# Patient Record
Sex: Female | Born: 1940 | Race: Black or African American | Hispanic: No | Smoking: Never smoker
Health system: Southern US, Community
[De-identification: ages and names within clinical notes are randomized; demographics above are authoritative.]

## PROBLEM LIST (undated history)

## (undated) DIAGNOSIS — R0600 Dyspnea, unspecified: Secondary | ICD-10-CM

## (undated) DIAGNOSIS — R600 Localized edema: Secondary | ICD-10-CM

## (undated) DIAGNOSIS — I340 Nonrheumatic mitral (valve) insufficiency: Secondary | ICD-10-CM

## (undated) DIAGNOSIS — I890 Lymphedema, not elsewhere classified: Secondary | ICD-10-CM

## (undated) DIAGNOSIS — I4891 Unspecified atrial fibrillation: Secondary | ICD-10-CM

## (undated) DIAGNOSIS — I10 Essential (primary) hypertension: Secondary | ICD-10-CM

## (undated) DIAGNOSIS — I493 Ventricular premature depolarization: Secondary | ICD-10-CM

## (undated) DIAGNOSIS — E119 Type 2 diabetes mellitus without complications: Secondary | ICD-10-CM

## (undated) DIAGNOSIS — I429 Cardiomyopathy, unspecified: Secondary | ICD-10-CM

## (undated) DIAGNOSIS — D472 Monoclonal gammopathy: Secondary | ICD-10-CM

## (undated) HISTORY — DX: Ventricular premature depolarization: I49.3

## (undated) HISTORY — DX: Cardiomyopathy, unspecified: I42.9

## (undated) HISTORY — DX: Nonrheumatic mitral (valve) insufficiency: I34.0

## (undated) HISTORY — DX: Dyspnea, unspecified: R06.00

## (undated) HISTORY — DX: Lymphedema, not elsewhere classified: I89.0

## (undated) HISTORY — DX: Type 2 diabetes mellitus without complications: E11.9

## (undated) HISTORY — DX: Monoclonal gammopathy: D47.2

## (undated) HISTORY — DX: Localized edema: R60.0

---

## 2014-07-16 ENCOUNTER — Telehealth: Payer: Self-pay | Admitting: Hematology and Oncology

## 2014-07-16 NOTE — Telephone Encounter (Signed)
NEW PATIENT APPT-S/W PATIENT AND GAVE NP APPT FOR 05/27 @ 12:30 W/DR. GUDENA DX-ELEVATED PROTEIN W/ABNL SPEP

## 2014-07-31 ENCOUNTER — Telehealth: Payer: Self-pay | Admitting: Hematology and Oncology

## 2014-07-31 ENCOUNTER — Other Ambulatory Visit: Payer: Self-pay | Admitting: *Deleted

## 2014-07-31 ENCOUNTER — Encounter: Payer: Self-pay | Admitting: *Deleted

## 2014-07-31 ENCOUNTER — Ambulatory Visit: Payer: Medicare Other

## 2014-07-31 ENCOUNTER — Ambulatory Visit (HOSPITAL_BASED_OUTPATIENT_CLINIC_OR_DEPARTMENT_OTHER): Payer: Medicare Other | Admitting: Hematology and Oncology

## 2014-07-31 ENCOUNTER — Encounter: Payer: Self-pay | Admitting: Hematology and Oncology

## 2014-07-31 VITALS — BP 185/87 | HR 84 | Temp 98.5°F | Resp 18 | Ht 64.0 in | Wt 205.0 lb

## 2014-07-31 DIAGNOSIS — R799 Abnormal finding of blood chemistry, unspecified: Secondary | ICD-10-CM

## 2014-07-31 DIAGNOSIS — D472 Monoclonal gammopathy: Secondary | ICD-10-CM

## 2014-07-31 NOTE — Assessment & Plan Note (Addendum)
Elevated M protein: This is based on results done on 06/17/2014 at [facility] which showed IgG level 3018, M spike 1.7 g, 29.69, lambda 17.55, A lambda ratio 1.69, IgG Monoclonal protein, hemoglobin 14.7, creatinine 0.82, normal white count and differential, calcium 9.5, albumen 4.3, urine protein leukapheresis no M spike, LDH 199, beta 2 microglobulin 2.1, bone survey also normal  Counseling: I discussed with the patient in the spectrum of disorders called monoclonal gammopathies that range from MGUS to multiple myeloma. I discussed the pathogenesis and end organ dysfunction that monoclonal proteins can lead to a multiple myeloma. Patient does not have anemia or hypercalcemia or renal dysfunction. I suspect that she may have MGUS.  Plan: 1. Return to clinic in 6 weeks with labs done prior to the visit 2. Plan is to follow her every 3 months with serum protein electrophoresis, CBC differential, CMP to watch for any changes in her renal function, calcium levels and anemia.  If there are any changes, then we will pursue a bone marrow biopsy.

## 2014-07-31 NOTE — Telephone Encounter (Signed)
Gave avs & calendar for July °

## 2014-07-31 NOTE — Progress Notes (Signed)
I checked in new patient with no issues prior to seeing the dr °

## 2014-07-31 NOTE — Progress Notes (Signed)
Received office notes from [facility], sent to scan.

## 2014-07-31 NOTE — Progress Notes (Signed)
[place] CONSULT NOTE  Patient Care Team: No Pcp Per Patient as PCP - General (General Practice)  CHIEF COMPLAINTS/PURPOSE OF CONSULTATION:  Newly diagnosed MGUS  HISTORY OF PRESENTING ILLNESS:  [name] Long 74 y.o. female is here because of recent diagnosis of MGUS. Patient lives at [place] through the winter and had her primary care physician check labs which showed elevated serum protein. This led to additional workup with serum protein electrophoresis that showed monoclonal protein of 1.9 g which was IgG kappa in March 2016. Comprehensive evaluation did not show any evidence of anemia or hypercalcemia or renal dysfunction. Bone survey was performed which was normal. She had a repeat serum electrophoresis in April 2016 which showed an M protein of 1.7 g. Patient lives in [place] through the summer and is here today accompanied by her daughter. She reports no bone pain or any other symptoms of myeloma.   I reviewed her records extensively and collaborated the history with the patient.  MEDICAL HISTORY: Hypertension, diabetes, cataracts SURGICAL HISTORY: Cesarean section  SOCIAL HISTORY: Single denies tobacco alcohol or recreational drug use retired lives half time and [place] and half-time [place]  FAMILY HISTORY: no history of any cancers  ALLERGIES:  has no allergies on file. REVIEW OF SYSTEMS:   Constitutional: Denies fevers, chills or abnormal night sweats Eyes: Denies blurriness of vision, double vision or watery eyes Ears, nose, mouth, throat, and face: Denies mucositis or sore throat Respiratory: Denies cough, dyspnea or wheezes Cardiovascular: Denies palpitation, chest discomfort or lower extremity swelling Gastrointestinal:  Denies nausea, heartburn or change in bowel habits Skin: Denies abnormal skin rashes Lymphatics: Denies new lymphadenopathy or easy bruising Neurological:Denies numbness, tingling or new  weaknesses Behavioral/Psych: Mood is stable, no new changes   All other systems were reviewed with the patient and are negative.  PHYSICAL EXAMINATION: ECOG PERFORMANCE STATUS: 1 - Symptomatic but completely ambulatory  Filed Vitals:   07/31/14 1243  BP: 185/87  Pulse: 84  Temp: 98.5 F (36.9 C)  Resp: 18   Filed Weights   07/31/14 1243  Weight: 205 lb (92.987 kg)    GENERAL:alert, no distress and comfortable SKIN: skin color, texture, turgor are normal, no rashes or significant lesions EYES: normal, conjunctiva are pink and non-injected, sclera clear OROPHARYNX:no exudate, no erythema and lips, buccal mucosa, and tongue normal  NECK: supple, thyroid normal size, non-tender, without nodularity LYMPH:  no palpable lymphadenopathy in the cervical, axillary or inguinal LUNGS: clear to auscultation and percussion with normal breathing effort HEART: regular rate & rhythm and no murmurs and no lower extremity edema ABDOMEN:abdomen soft, non-tender and normal bowel sounds Musculoskeletal:no cyanosis of digits and no clubbing  PSYCH: alert & oriented x 3 with fluent speech NEURO: no focal motor/sensory deficits RADIOGRAPHIC STUDIES: Bone survey done at [place] is normal  ASSESSMENT AND PLAN:  MGUS (monoclonal gammopathy of unknown significance) Elevated M protein: This is based on results done on 06/17/2014 at [facility] which showed IgG level 3018, M spike 1.7 g, 29.69, lambda 17.55, A lambda ratio 1.69, IgG Monoclonal protein, hemoglobin 14.7, creatinine 0.82, normal white count and differential, calcium 9.5, albumen 4.3, urine protein leukapheresis no M spike, LDH 199, beta 2 microglobulin 2.1, bone survey also normal  Counseling: I discussed with the patient in the spectrum of disorders called monoclonal gammopathies that range from MGUS to multiple myeloma. I discussed the pathogenesis and end organ dysfunction that monoclonal proteins can lead to a  multiple myeloma. Patient does not have anemia or hypercalcemia or renal dysfunction. I suspect that she may have MGUS.  Plan: 1. Return to clinic in 6 weeks with labs done prior to the visit 2. Plan is to follow her every 3 months with serum protein electrophoresis, CBC differential, CMP to watch for any changes in her renal function, calcium levels and anemia.  If there are any changes, then we will pursue a bone marrow biopsy.    All questions were answered. The patient knows to call the clinic with any problems, questions or concerns.    Rulon Eisenmenger, MD 1:54 PM

## 2014-09-11 ENCOUNTER — Other Ambulatory Visit (HOSPITAL_BASED_OUTPATIENT_CLINIC_OR_DEPARTMENT_OTHER): Payer: Medicare Other

## 2014-09-11 DIAGNOSIS — D472 Monoclonal gammopathy: Secondary | ICD-10-CM

## 2014-09-11 LAB — COMPREHENSIVE METABOLIC PANEL (CC13)
ALT: 15 U/L (ref 0–55)
ANION GAP: 9 meq/L (ref 3–11)
AST: 18 U/L (ref 5–34)
Albumin: 3.7 g/dL (ref 3.5–5.0)
Alkaline Phosphatase: 82 U/L (ref 40–150)
BUN: 14.9 mg/dL (ref 7.0–26.0)
CO2: 27 meq/L (ref 22–29)
Calcium: 9.7 mg/dL (ref 8.4–10.4)
Chloride: 105 mEq/L (ref 98–109)
Creatinine: 0.9 mg/dL (ref 0.6–1.1)
EGFR: 62 mL/min/{1.73_m2} — AB (ref >90)
GLUCOSE: 102 mg/dL (ref 70–140)
Potassium: 3.9 mEq/L (ref 3.5–5.1)
SODIUM: 140 meq/L (ref 136–145)
TOTAL PROTEIN: 8.8 g/dL — AB (ref 6.4–8.3)
Total Bilirubin: 0.99 mg/dL (ref 0.20–1.20)

## 2014-09-11 LAB — CBC WITH DIFFERENTIAL/PLATELET
BASO%: 0.3 % (ref 0.0–2.0)
Basophils Absolute: 0 10*3/uL (ref 0.0–0.1)
EOS ABS: 0.1 10*3/uL (ref 0.0–0.5)
EOS%: 1.1 % (ref 0.0–7.0)
HCT: 43.5 % (ref 34.8–46.6)
HGB: 14.9 g/dL (ref 11.6–15.9)
LYMPH%: 36.3 % (ref 14.0–49.7)
MCH: 31.3 pg (ref 25.1–34.0)
MCHC: 34.3 g/dL (ref 31.5–36.0)
MCV: 91.4 fL (ref 79.5–101.0)
MONO#: 0.4 10*3/uL (ref 0.1–0.9)
MONO%: 6.4 % (ref 0.0–14.0)
NEUT%: 55.9 % (ref 38.4–76.8)
NEUTROS ABS: 3.4 10*3/uL (ref 1.5–6.5)
PLATELETS: 168 10*3/uL (ref 145–400)
RBC: 4.76 10*6/uL (ref 3.70–5.45)
RDW: 13.7 % (ref 11.2–14.5)
WBC: 6.1 10*3/uL (ref 3.9–10.3)
lymph#: 2.2 10*3/uL (ref 0.9–3.3)

## 2014-09-15 LAB — KAPPA/LAMBDA LIGHT CHAINS
KAPPA LAMBDA RATIO: 2.65 — AB (ref 0.26–1.65)
Kappa free light chain: 3.55 mg/dL — ABNORMAL HIGH (ref 0.33–1.94)
LAMBDA FREE LGHT CHN: 1.34 mg/dL (ref 0.57–2.63)

## 2014-09-15 LAB — SPEP & IFE WITH QIG
ABNORMAL PROTEIN BAND1: 1.9 g/dL
ALBUMIN ELP: 3.9 g/dL (ref 3.8–4.8)
ALPHA-1-GLOBULIN: 0.3 g/dL (ref 0.2–0.3)
Alpha-2-Globulin: 0.6 g/dL (ref 0.5–0.9)
BETA 2: 2.5 g/dL — AB (ref 0.2–0.5)
Beta Globulin: 0.5 g/dL (ref 0.4–0.6)
GAMMA GLOBULIN: 0.6 g/dL — AB (ref 0.8–1.7)
IgA: 134 mg/dL (ref 69–380)
IgG (Immunoglobin G), Serum: 2750 mg/dL — ABNORMAL HIGH (ref 690–1700)
IgM, Serum: 110 mg/dL (ref 52–322)
Total Protein, Serum Electrophoresis: 8.3 g/dL — ABNORMAL HIGH (ref 6.1–8.1)

## 2014-09-15 LAB — BETA 2 MICROGLOBULIN, SERUM: Beta-2 Microglobulin: 2.33 mg/L (ref <=2.51)

## 2014-09-17 NOTE — Assessment & Plan Note (Signed)
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 I discussed the difference between MGUS and Myeloma.  Plan: 1. F/U Q 6 months with labs 2. If patient develops anemia, or hypercalcemia or renal insufficiency, we need to see sooner

## 2014-09-18 ENCOUNTER — Ambulatory Visit (HOSPITAL_BASED_OUTPATIENT_CLINIC_OR_DEPARTMENT_OTHER): Payer: Medicare Other | Admitting: Hematology and Oncology

## 2014-09-18 ENCOUNTER — Telehealth: Payer: Self-pay | Admitting: Hematology and Oncology

## 2014-09-18 ENCOUNTER — Encounter: Payer: Self-pay | Admitting: Hematology and Oncology

## 2014-09-18 VITALS — BP 190/87 | HR 87 | Temp 98.4°F | Resp 18 | Ht 64.0 in | Wt 208.8 lb

## 2014-09-18 DIAGNOSIS — D472 Monoclonal gammopathy: Secondary | ICD-10-CM

## 2014-09-18 NOTE — Progress Notes (Signed)
Patient Care Team: No Pcp Per Patient as PCP - General (General Practice)  DIAGNOSIS: MGUS Current treatment: Observation  CHIEF COMPLIANT: Follow-up on blood work  INTERVAL HISTORY: [name] Long is a 74 year old with above-mentioned history of elevated monoclonal protein was here to discuss the results of blood work that she had done on 09/11/2014. She reports no new problems or concerns. Denies any bone pain.  REVIEW OF SYSTEMS:   Constitutional: Denies fevers, chills or abnormal weight loss Eyes: Denies blurriness of vision Ears, nose, mouth, throat, and face: Denies mucositis or sore throat Respiratory: Denies cough, dyspnea or wheezes Cardiovascular: Denies palpitation, chest discomfort or lower extremity swelling Gastrointestinal:  Denies nausea, heartburn or change in bowel habits Skin: Denies abnormal skin rashes Lymphatics: Denies new lymphadenopathy or easy bruising Neurological:Denies numbness, tingling or new weaknesses Behavioral/Psych: Mood is stable, no new changes  All other systems were reviewed with the patient and are negative.  I have reviewed the past medical history, past surgical history, social history and family history with the patient and they are unchanged from previous note.  ALLERGIES:  has no allergies on file.  MEDICATIONS:  No current outpatient prescriptions on file.   No current facility-administered medications for this visit.    PHYSICAL EXAMINATION: ECOG PERFORMANCE STATUS: 0 - Asymptomatic  Filed Vitals:   09/18/14 0806  BP: 190/87  Pulse: 87  Temp: 98.4 F (36.9 C)  Resp: 18   Filed Weights   09/18/14 0806  Weight: 208 lb 12.8 oz (94.711 kg)    GENERAL:alert, no distress and comfortable SKIN: skin color, texture, turgor are normal, no rashes or significant lesions EYES: normal, Conjunctiva are pink and non-injected, sclera clear OROPHARYNX:no exudate, no erythema and lips, buccal mucosa, and tongue normal  NECK: supple,  thyroid normal size, non-tender, without nodularity LYMPH:  no palpable lymphadenopathy in the cervical, axillary or inguinal LUNGS: clear to auscultation and percussion with normal breathing effort HEART: regular rate & rhythm and no murmurs and no lower extremity edema ABDOMEN:abdomen soft, non-tender and normal bowel sounds Musculoskeletal:no cyanosis of digits and no clubbing  NEURO: alert & oriented x 3 with fluent speech, no focal motor/sensory deficits  LABORATORY DATA:  I have reviewed the data as listed   Chemistry      Component Value Date/Time   NA 140 09/11/2014 0813   K 3.9 09/11/2014 0813   CO2 27 09/11/2014 0813   BUN 14.9 09/11/2014 0813   CREATININE 0.9 09/11/2014 0813      Component Value Date/Time   CALCIUM 9.7 09/11/2014 0813   ALKPHOS 82 09/11/2014 0813   AST 18 09/11/2014 0813   ALT 15 09/11/2014 0813   BILITOT 0.99 09/11/2014 0813       Lab Results  Component Value Date   WBC 6.1 09/11/2014   HGB 14.9 09/11/2014   HCT 43.5 09/11/2014   MCV 91.4 09/11/2014   PLT 168 09/11/2014   NEUTROABS 3.4 09/11/2014   ASSESSMENT & PLAN:  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 I discussed the difference between MGUS and Myeloma. Based on these labs there is no evidence of end organ damage and hence does not fit the criteria for multiple myeloma.   Plan: 1. F/U 3 months with labs,  if the labs look stable then we will see her every 6 months thereafter. Patient plans to leave to [facility] after her follow-up visit. 2. If patient develops anemia, or hypercalcemia or renal insufficiency, we need to see sooner  Orders Placed This Encounter  Procedures  . CBC with Differential     Standing Status: Future     Number of Occurrences:      Standing Expiration Date: 09/18/2015  . Comprehensive metabolic panel (Cmet) - [facility]    Standing Status: Future     Number of Occurrences:      Standing Expiration Date: 09/18/2015  . Beta 2 microglobulin, serum    Standing Status: Future     Number of Occurrences:      Standing Expiration Date: 10/23/2015  . SPEP & IFE with QIG    Standing Status: Future     Number of Occurrences:      Standing Expiration Date: 10/23/2015  . Kappa/lambda light chains    Standing Status: Future     Number of Occurrences:      Standing Expiration Date: 10/23/2015   The patient has a good understanding of the overall plan. she agrees with it. she will call with any problems that may develop before the next visit here.   Rulon Eisenmenger, MD

## 2014-09-18 NOTE — Telephone Encounter (Signed)
Gave avs & calendar for October. °

## 2014-12-18 ENCOUNTER — Other Ambulatory Visit (HOSPITAL_BASED_OUTPATIENT_CLINIC_OR_DEPARTMENT_OTHER): Payer: Medicare Other

## 2014-12-18 DIAGNOSIS — D472 Monoclonal gammopathy: Secondary | ICD-10-CM | POA: Diagnosis present

## 2014-12-18 LAB — CBC WITH DIFFERENTIAL/PLATELET
BASO%: 0.8 % (ref 0.0–2.0)
BASOS ABS: 0 10*3/uL (ref 0.0–0.1)
EOS%: 1.2 % (ref 0.0–7.0)
Eosinophils Absolute: 0.1 10*3/uL (ref 0.0–0.5)
HCT: 40.8 % (ref 34.8–46.6)
HEMOGLOBIN: 13.8 g/dL (ref 11.6–15.9)
LYMPH%: 37.9 % (ref 14.0–49.7)
MCH: 30.7 pg (ref 25.1–34.0)
MCHC: 33.9 g/dL (ref 31.5–36.0)
MCV: 90.6 fL (ref 79.5–101.0)
MONO#: 0.4 10*3/uL (ref 0.1–0.9)
MONO%: 6.2 % (ref 0.0–14.0)
NEUT#: 3.1 10*3/uL (ref 1.5–6.5)
NEUT%: 53.9 % (ref 38.4–76.8)
PLATELETS: 158 10*3/uL (ref 145–400)
RBC: 4.5 10*6/uL (ref 3.70–5.45)
RDW: 13.8 % (ref 11.2–14.5)
WBC: 5.8 10*3/uL (ref 3.9–10.3)
lymph#: 2.2 10*3/uL (ref 0.9–3.3)

## 2014-12-18 LAB — COMPREHENSIVE METABOLIC PANEL (CC13)
ALT: 11 U/L (ref 0–55)
ANION GAP: 8 meq/L (ref 3–11)
AST: 15 U/L (ref 5–34)
Albumin: 3.5 g/dL (ref 3.5–5.0)
Alkaline Phosphatase: 79 U/L (ref 40–150)
BILIRUBIN TOTAL: 0.89 mg/dL (ref 0.20–1.20)
BUN: 10.9 mg/dL (ref 7.0–26.0)
CALCIUM: 8.9 mg/dL (ref 8.4–10.4)
CO2: 27 mEq/L (ref 22–29)
CREATININE: 0.9 mg/dL (ref 0.6–1.1)
Chloride: 103 mEq/L (ref 98–109)
EGFR: 67 mL/min/{1.73_m2} — ABNORMAL LOW (ref >90)
Glucose: 115 mg/dl (ref 70–140)
Potassium: 3.9 mEq/L (ref 3.5–5.1)
Sodium: 139 mEq/L (ref 136–145)
Total Protein: 8.3 g/dL (ref 6.4–8.3)

## 2014-12-22 LAB — SPEP & IFE WITH QIG
ALPHA-2-GLOBULIN: 0.5 g/dL (ref 0.5–0.9)
Abnormal Protein Band1: 2 g/dL
Albumin ELP: 3.8 g/dL (ref 3.8–4.8)
Alpha-1-Globulin: 0.3 g/dL (ref 0.2–0.3)
BETA GLOBULIN: 0.4 g/dL (ref 0.4–0.6)
Beta 2: 2.5 g/dL — ABNORMAL HIGH (ref 0.2–0.5)
Gamma Globulin: 0.6 g/dL — ABNORMAL LOW (ref 0.8–1.7)
IgA: 156 mg/dL (ref 69–380)
IgG (Immunoglobin G), Serum: 2990 mg/dL — ABNORMAL HIGH (ref 690–1700)
IgM, Serum: 104 mg/dL (ref 52–322)
Total Protein, Serum Electrophoresis: 8.2 g/dL — ABNORMAL HIGH (ref 6.1–8.1)

## 2014-12-22 LAB — KAPPA/LAMBDA LIGHT CHAINS
KAPPA FREE LGHT CHN: 2.55 mg/dL — AB (ref 0.33–1.94)
Kappa:Lambda Ratio: 2.71 — ABNORMAL HIGH (ref 0.26–1.65)
Lambda Free Lght Chn: 0.94 mg/dL (ref 0.57–2.63)

## 2014-12-22 LAB — BETA 2 MICROGLOBULIN, SERUM: Beta-2 Microglobulin: 2.11 mg/L (ref <=2.51)

## 2014-12-24 NOTE — Assessment & Plan Note (Signed)
IgG Kappa MGUS: M-Protein 2 gm (was 1.9 gms); Kappa 2.55 (was 3.55) ; K:L ratio: 2.71 (was 2.65)  Normal Hb, Creatinine and calcium Bone Survey 06/11/14: No lytic lesions Beta 2 Microglobulin: 2.33 IgG: 2750 mg/dl Albumin: 3.7  I discussed the results and provided patient a copy of the results. I discussed the difference between MGUS and Myeloma. Based on these labs there is no evidence of end organ damage and hence does not fit the criteria for multiple myeloma.   Plan: 1. F/U 6 months with labs Patient plans to leave to [facility] 2. If patient develops anemia, or hypercalcemia or renal insufficiency, we need to see sooner

## 2014-12-25 ENCOUNTER — Ambulatory Visit (HOSPITAL_BASED_OUTPATIENT_CLINIC_OR_DEPARTMENT_OTHER): Payer: Medicare Other | Admitting: Hematology and Oncology

## 2014-12-25 ENCOUNTER — Encounter: Payer: Self-pay | Admitting: Hematology and Oncology

## 2014-12-25 ENCOUNTER — Telehealth: Payer: Self-pay | Admitting: Hematology and Oncology

## 2014-12-25 VITALS — BP 186/88 | HR 85 | Temp 98.1°F | Resp 18 | Ht 64.0 in | Wt 219.7 lb

## 2014-12-25 DIAGNOSIS — I10 Essential (primary) hypertension: Secondary | ICD-10-CM

## 2014-12-25 DIAGNOSIS — D472 Monoclonal gammopathy: Secondary | ICD-10-CM

## 2014-12-25 DIAGNOSIS — R2240 Localized swelling, mass and lump, unspecified lower limb: Secondary | ICD-10-CM | POA: Diagnosis not present

## 2014-12-25 NOTE — Telephone Encounter (Signed)
Patient aware of 63month appointments

## 2014-12-25 NOTE — Progress Notes (Signed)
Patient Care Team: No Pcp Per Patient as PCP - General (General Practice)  DIAGNOSIS: MGUS  CHIEF COMPLIANT: Leg swelling  INTERVAL HISTORY: [name] Long is a 74 year old with above-mentioned history of MGUS who is currently on surveillance. She is here for three-month follow-up to recheck her blood work. She reports no major problems or concerns. She is very excited about going back to [place] where she lives for the rest of the winter. She has chronic leg swelling which is unchanged from before. Her blood pressure is elevated today but she reports that it is white coat hypertension.  REVIEW OF SYSTEMS:   Constitutional: Denies fevers, chills or abnormal weight loss Eyes: Denies blurriness of vision Ears, nose, mouth, throat, and face: Denies mucositis or sore throat Respiratory: Denies cough, dyspnea or wheezes Cardiovascular: Denies palpitation, chest discomfort, complains of leg swelling Gastrointestinal:  Denies nausea, heartburn or change in bowel habits Skin: Denies abnormal skin rashes Lymphatics: Denies new lymphadenopathy or easy bruising Neurological:Denies numbness, tingling or new weaknesses Behavioral/Psych: Mood is stable, no new changes   All other systems were reviewed with the patient and are negative.  I have reviewed the past medical history, past surgical history, social history and family history with the patient and they are unchanged from previous note.  ALLERGIES:  has no allergies on file.  MEDICATIONS:  No current outpatient prescriptions on file.   No current facility-administered medications for this visit.    PHYSICAL EXAMINATION: ECOG PERFORMANCE STATUS: 1 - Symptomatic but completely ambulatory  Filed Vitals:   12/25/14 0810  BP: 186/88  Pulse: 85  Temp: 98.1 F (36.7 C)  Resp: 18   Filed Weights   12/25/14 0810  Weight: 219 lb 11.2 oz (99.655 kg)    GENERAL:alert, no distress and comfortable SKIN: skin color, texture, turgor are  normal, no rashes or significant lesions EYES: normal, Conjunctiva are pink and non-injected, sclera clear OROPHARYNX:no exudate, no erythema and lips, buccal mucosa, and tongue normal  NECK: supple, thyroid normal size, non-tender, without nodularity LYMPH:  no palpable lymphadenopathy in the cervical, axillary or inguinal LUNGS: clear to auscultation and percussion with normal breathing effort HEART: regular rate & rhythm and no murmurs and 2+ lower extremity edema ABDOMEN:abdomen soft, non-tender and normal bowel sounds Musculoskeletal:no cyanosis of digits and no clubbing  NEURO: alert & oriented x 3 with fluent speech, no focal motor/sensory deficits  LABORATORY DATA:  I have reviewed the data as listed   Chemistry      Component Value Date/Time   NA 139 12/18/2014 0758   K 3.9 12/18/2014 0758   CO2 27 12/18/2014 0758   BUN 10.9 12/18/2014 0758   CREATININE 0.9 12/18/2014 0758      Component Value Date/Time   CALCIUM 8.9 12/18/2014 0758   ALKPHOS 79 12/18/2014 0758   AST 15 12/18/2014 0758   ALT 11 12/18/2014 0758   BILITOT 0.89 12/18/2014 0758       Lab Results  Component Value Date   WBC 5.8 12/18/2014   HGB 13.8 12/18/2014   HCT 40.8 12/18/2014   MCV 90.6 12/18/2014   PLT 158 12/18/2014   NEUTROABS 3.1 12/18/2014   ASSESSMENT & PLAN:  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 I discussed the difference between MGUS and Myeloma. Based on these labs there is no evidence of end organ damage and hence does not fit the criteria for multiple myeloma.   Plan: 1. F/U 7 months with labs Patient plans to leave to [facility] 2. If patient develops anemia, or  hypercalcemia or renal insufficiency, we need to see sooner 3. Leg swelling: I encouraged her to see her primary care physician. I discussed with her about decreasing salt intake. 4. Hypertension: Patient reports that this is white coat hypertension.   Orders Placed This Encounter  Procedures  . CBC with Differential    Standing Status: Future     Number of Occurrences:      Standing Expiration Date: 12/25/2015  . Comprehensive metabolic panel (Cmet) - [facility]    Standing Status: Future     Number of Occurrences:      Standing Expiration Date: 12/25/2015  . Beta 2 microglobulin, serum    Standing Status: Future     Number of Occurrences:      Standing Expiration Date: 01/29/2016  . SPEP & IFE with QIG    Standing Status: Future     Number of Occurrences:      Standing Expiration Date: 01/29/2016  . Kappa/lambda light chains    Standing Status: Future     Number of Occurrences:      Standing Expiration Date: 01/29/2016   The patient has a good understanding of the overall plan. she agrees with it. she will call with any problems that may develop before the next visit here.   Rulon Eisenmenger, MD 12/25/2014

## 2015-07-16 ENCOUNTER — Other Ambulatory Visit (HOSPITAL_BASED_OUTPATIENT_CLINIC_OR_DEPARTMENT_OTHER): Payer: Medicare Other

## 2015-07-16 DIAGNOSIS — D472 Monoclonal gammopathy: Secondary | ICD-10-CM | POA: Diagnosis present

## 2015-07-16 LAB — CBC WITH DIFFERENTIAL/PLATELET
BASO%: 1.3 % (ref 0.0–2.0)
Basophils Absolute: 0.1 10*3/uL (ref 0.0–0.1)
EOS ABS: 0.1 10*3/uL (ref 0.0–0.5)
EOS%: 1.8 % (ref 0.0–7.0)
HCT: 43.2 % (ref 34.8–46.6)
HGB: 14.2 g/dL (ref 11.6–15.9)
LYMPH%: 29.6 % (ref 14.0–49.7)
MCH: 30.1 pg (ref 25.1–34.0)
MCHC: 33 g/dL (ref 31.5–36.0)
MCV: 91.5 fL (ref 79.5–101.0)
MONO#: 0.4 10*3/uL (ref 0.1–0.9)
MONO%: 7 % (ref 0.0–14.0)
NEUT#: 3.8 10*3/uL (ref 1.5–6.5)
NEUT%: 60.3 % (ref 38.4–76.8)
PLATELETS: 170 10*3/uL (ref 145–400)
RBC: 4.72 10*6/uL (ref 3.70–5.45)
RDW: 13.7 % (ref 11.2–14.5)
WBC: 6.3 10*3/uL (ref 3.9–10.3)
lymph#: 1.9 10*3/uL (ref 0.9–3.3)

## 2015-07-16 LAB — COMPREHENSIVE METABOLIC PANEL
ALT: 15 U/L (ref 0–55)
ANION GAP: 9 meq/L (ref 3–11)
AST: 17 U/L (ref 5–34)
Albumin: 3.6 g/dL (ref 3.5–5.0)
Alkaline Phosphatase: 81 U/L (ref 40–150)
BUN: 15.5 mg/dL (ref 7.0–26.0)
CHLORIDE: 103 meq/L (ref 98–109)
CO2: 27 meq/L (ref 22–29)
CREATININE: 1 mg/dL (ref 0.6–1.1)
Calcium: 9.2 mg/dL (ref 8.4–10.4)
EGFR: 55 mL/min/{1.73_m2} — ABNORMAL LOW (ref >90)
Glucose: 123 mg/dl (ref 70–140)
Potassium: 3.9 mEq/L (ref 3.5–5.1)
Sodium: 139 mEq/L (ref 136–145)
Total Bilirubin: 0.75 mg/dL (ref 0.20–1.20)
Total Protein: 9.2 g/dL — ABNORMAL HIGH (ref 6.4–8.3)

## 2015-07-17 LAB — BETA 2 MICROGLOBULIN, SERUM: Beta-2: 2.1 mg/L (ref 0.6–2.4)

## 2015-07-19 LAB — KAPPA/LAMBDA LIGHT CHAINS
Ig Kappa Free Light Chain: 41.59 mg/L — ABNORMAL HIGH (ref 3.30–19.40)
Ig Lambda Free Light Chain: 15.49 mg/L (ref 5.71–26.30)
Kappa/Lambda FluidC Ratio: 2.68 — ABNORMAL HIGH (ref 0.26–1.65)

## 2015-07-21 LAB — MULTIPLE MYELOMA PANEL, SERUM
ALBUMIN/GLOB SERPL: 0.8 (ref 0.7–1.7)
ALPHA 1: 0.2 g/dL (ref 0.0–0.4)
ALPHA2 GLOB SERPL ELPH-MCNC: 0.7 g/dL (ref 0.4–1.0)
Albumin SerPl Elph-Mcnc: 3.7 g/dL (ref 2.9–4.4)
B-Globulin SerPl Elph-Mcnc: 0.7 g/dL (ref 0.7–1.3)
GAMMA GLOB SERPL ELPH-MCNC: 3.3 g/dL — AB (ref 0.4–1.8)
GLOBULIN, TOTAL: 5 g/dL — AB (ref 2.2–3.9)
IGA/IMMUNOGLOBULIN A, SERUM: 158 mg/dL (ref 64–422)
IgM, Qn, Serum: 135 mg/dL (ref 26–217)
M Protein SerPl Elph-Mcnc: 2.3 g/dL — ABNORMAL HIGH
Total Protein: 8.7 g/dL — ABNORMAL HIGH (ref 6.0–8.5)

## 2015-07-23 ENCOUNTER — Encounter: Payer: Self-pay | Admitting: Hematology and Oncology

## 2015-07-23 ENCOUNTER — Ambulatory Visit (HOSPITAL_BASED_OUTPATIENT_CLINIC_OR_DEPARTMENT_OTHER): Payer: Medicare Other | Admitting: Hematology and Oncology

## 2015-07-23 ENCOUNTER — Telehealth: Payer: Self-pay | Admitting: Hematology and Oncology

## 2015-07-23 VITALS — BP 150/75 | HR 97 | Temp 98.8°F | Resp 18 | Ht 64.0 in | Wt 215.7 lb

## 2015-07-23 DIAGNOSIS — I10 Essential (primary) hypertension: Secondary | ICD-10-CM

## 2015-07-23 DIAGNOSIS — M7989 Other specified soft tissue disorders: Secondary | ICD-10-CM

## 2015-07-23 DIAGNOSIS — D472 Monoclonal gammopathy: Secondary | ICD-10-CM

## 2015-07-23 DIAGNOSIS — Z0000 Encounter for general adult medical examination without abnormal findings: Secondary | ICD-10-CM | POA: Insufficient documentation

## 2015-07-23 NOTE — Telephone Encounter (Signed)
Gave patient/relative avs report and appointments for October  °

## 2015-07-23 NOTE — Progress Notes (Signed)
Patient Care Team: No Pcp Per Patient as PCP - General (General Practice)  DIAGNOSIS: MGUS   CHIEF COMPLIANT: Improvement in leg swelling  INTERVAL HISTORY: [name] Long is a 75 year old with above-mentioned history of MGUS who is currently on observation. She is here for blood work and follow-up. She reports no new problems or concerns. She plans to return back to [facility] in October 2017.  REVIEW OF SYSTEMS:   Constitutional: Denies fevers, chills or abnormal weight loss Eyes: Denies blurriness of vision Ears, nose, mouth, throat, and face: Denies mucositis or sore throat Respiratory: Denies cough, dyspnea or wheezes Cardiovascular: Denies palpitation, chest discomfort Gastrointestinal:  Denies nausea, heartburn or change in bowel habits Skin: Denies abnormal skin rashes Lymphatics: Denies new lymphadenopathy or easy bruising Neurological:Denies numbness, tingling or new weaknesses Behavioral/Psych: Mood is stable, no new changes  Extremities: 1 + lower extremity edema  All other systems were reviewed with the patient and are negative.  I have reviewed the past medical history, past surgical history, social history and family history with the patient and they are unchanged from previous note.  ALLERGIES:  has no allergies on file.  MEDICATIONS:  No current outpatient prescriptions on file.   No current facility-administered medications for this visit.    PHYSICAL EXAMINATION: ECOG PERFORMANCE STATUS: 0 - Asymptomatic  Filed Vitals:   07/23/15 0831  BP: 150/75  Pulse: 97  Temp: 98.8 F (37.1 C)  Resp: 18   Filed Weights   07/23/15 0831  Weight: 215 lb 11.2 oz (97.841 kg)    GENERAL:alert, no distress and comfortable SKIN: skin color, texture, turgor are normal, no rashes or significant lesions EYES: normal, Conjunctiva are pink and non-injected, sclera clear OROPHARYNX:no exudate, no erythema and lips, buccal mucosa, and tongue normal  NECK: supple, thyroid  normal size, non-tender, without nodularity LYMPH:  no palpable lymphadenopathy in the cervical, axillary or inguinal LUNGS: clear to auscultation and percussion with normal breathing effort HEART: regular rate & rhythm and no murmurs and no lower extremity edema ABDOMEN:abdomen soft, non-tender and normal bowel sounds MUSCULOSKELETAL:no cyanosis of digits and no clubbing  NEURO: alert & oriented x 3 with fluent speech, no focal motor/sensory deficits EXTREMITIES: No lower extremity edema  LABORATORY DATA:  I have reviewed the data as listed   Chemistry      Component Value Date/Time   NA 139 07/16/2015 0752   K 3.9 07/16/2015 0752   CO2 27 07/16/2015 0752   BUN 15.5 07/16/2015 0752   CREATININE 1.0 07/16/2015 0752      Component Value Date/Time   CALCIUM 9.2 07/16/2015 0752   ALKPHOS 81 07/16/2015 0752   AST 17 07/16/2015 0752   ALT 15 07/16/2015 0752   BILITOT 0.75 07/16/2015 0752       Lab Results  Component Value Date   WBC 6.3 07/16/2015   HGB 14.2 07/16/2015   HCT 43.2 07/16/2015   MCV 91.5 07/16/2015   PLT 170 07/16/2015   NEUTROABS 3.8 07/16/2015     ASSESSMENT & PLAN:  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 Based on these labs there is no evidence of end organ damage and hence does not  fit the criteria for multiple myeloma.   Plan: 1. F/U in 1 year with labs Patient lives in [place] 2. If patient develops anemia, or hypercalcemia or renal insufficiency, we need to see sooner 3. Leg swelling: I encouraged her to see her primary care physician. I discussed with her about decreasing salt intake. 4. Hypertension: Patient reports  that this is white coat hypertension.  Return to clinic in October for follow-up with blood work done ahead of time  Orders Placed This Encounter  Procedures  . CBC with Differential    Standing Status: Future     Number of Occurrences:      Standing Expiration Date: 07/22/2016  . Lactate dehydrogenase (LDH)    Standing Status: Future     Number of Occurrences:      Standing Expiration Date: 07/22/2016  . Kappa/lambda light chains    Standing Status: Future     Number of Occurrences:      Standing Expiration Date: 08/26/2016  . Multiple Myeloma Panel (SPEP&IFE w/QIG)    Standing Status: Future     Number of Occurrences:      Standing Expiration Date: 08/26/2016  . Beta 2 microglobulin, serum    Standing Status: Future     Number of Occurrences:      Standing Expiration Date: 08/26/2016  . Hemoglobin A1C  . Lipid panel  . TSH  . Microalbumin, urine   The patient has a good understanding of the overall plan. she agrees with it. she will call with any problems that may develop before the next visit here.   Rulon Eisenmenger, MD 07/23/2015

## 2015-07-23 NOTE — Assessment & Plan Note (Signed)
IgG Kappa MGUS: M-Protein 2.3 gm (was 2 gms); Kappa 41.59 Lambda: 15.49 ; K:L ratio: 2.68 (was 2.65)  IgG: 3253 (was 2990) Normal Hb, Creatinine and calcium Bone Survey 06/11/14: No lytic lesions Beta 2 Microglobulin: 2.1 (was 2.33) Albumin: 3.6  I discussed the results and provided patient a copy of the results. Based on these labs there is no evidence of end organ damage and hence does not fit the criteria for multiple myeloma.   Plan: 1. F/U in 1 year with labs Patient lives in [place] 2. If patient develops anemia, or hypercalcemia or renal insufficiency, we need to see sooner 3. Leg swelling: I encouraged her to see her primary care physician. I discussed with her about decreasing salt intake. 4. Hypertension: Patient reports that this is white coat hypertension.

## 2015-12-17 ENCOUNTER — Other Ambulatory Visit (HOSPITAL_BASED_OUTPATIENT_CLINIC_OR_DEPARTMENT_OTHER): Payer: Medicare Other

## 2015-12-17 ENCOUNTER — Telehealth: Payer: Self-pay

## 2015-12-17 DIAGNOSIS — E7800 Pure hypercholesterolemia, unspecified: Secondary | ICD-10-CM

## 2015-12-17 DIAGNOSIS — D472 Monoclonal gammopathy: Secondary | ICD-10-CM | POA: Diagnosis present

## 2015-12-17 LAB — CBC WITH DIFFERENTIAL/PLATELET
BASO%: 1.6 % (ref 0.0–2.0)
BASOS ABS: 0.1 10*3/uL (ref 0.0–0.1)
EOS%: 1.5 % (ref 0.0–7.0)
Eosinophils Absolute: 0.1 10*3/uL (ref 0.0–0.5)
HEMATOCRIT: 42.1 % (ref 34.8–46.6)
HGB: 14 g/dL (ref 11.6–15.9)
LYMPH#: 2.1 10*3/uL (ref 0.9–3.3)
LYMPH%: 30.8 % (ref 14.0–49.7)
MCH: 29.8 pg (ref 25.1–34.0)
MCHC: 33.2 g/dL (ref 31.5–36.0)
MCV: 89.7 fL (ref 79.5–101.0)
MONO#: 0.3 10*3/uL (ref 0.1–0.9)
MONO%: 4.8 % (ref 0.0–14.0)
NEUT#: 4.2 10*3/uL (ref 1.5–6.5)
NEUT%: 61.3 % (ref 38.4–76.8)
PLATELETS: 163 10*3/uL (ref 145–400)
RBC: 4.69 10*6/uL (ref 3.70–5.45)
RDW: 14 % (ref 11.2–14.5)
WBC: 6.8 10*3/uL (ref 3.9–10.3)

## 2015-12-17 LAB — TSH: TSH: 1.823 m[IU]/L (ref 0.308–3.960)

## 2015-12-17 LAB — LACTATE DEHYDROGENASE: LDH: 165 U/L (ref 125–245)

## 2015-12-17 NOTE — Telephone Encounter (Signed)
Received request from Lelan Pons in our lab stating pt had labs done this morning that would not be covered by insurance and was unsure how she should proceed.  According to Lelan Pons, TSH and A1C will not be covered by Medicare unless done at PCP. I called to explain this to pt who stated she is willing to pay out of pocket.  Informed Lelan Pons that pt wishes for labs to be run at our facility and thus billed directly.

## 2015-12-18 LAB — LIPID PANEL
CHOLESTEROL TOTAL: 189 mg/dL (ref 100–199)
Chol/HDL Ratio: 3.6 ratio units (ref 0.0–4.4)
HDL: 53 mg/dL (ref >39)
LDL Calculated: 123 mg/dL — ABNORMAL HIGH (ref 0–99)
TRIGLYCERIDES: 66 mg/dL (ref 0–149)
VLDL Cholesterol Cal: 13 mg/dL (ref 5–40)

## 2015-12-18 LAB — MICROALBUMIN, URINE: MICROALBUM., U, RANDOM: 50.5 ug/mL

## 2015-12-18 LAB — HEMOGLOBIN A1C
ESTIMATED AVERAGE GLUCOSE: 143 mg/dL
HEMOGLOBIN A1C: 6.6 % — AB (ref 4.8–5.6)

## 2015-12-20 LAB — KAPPA/LAMBDA LIGHT CHAINS
Ig Kappa Free Light Chain: 29.8 mg/L — ABNORMAL HIGH (ref 3.3–19.4)
Ig Lambda Free Light Chain: 13 mg/L (ref 5.7–26.3)
KAPPA/LAMBDA FLC RATIO: 2.29 — AB (ref 0.26–1.65)

## 2015-12-21 LAB — MULTIPLE MYELOMA PANEL, SERUM
ALBUMIN/GLOB SERPL: 0.8 (ref 0.7–1.7)
Albumin SerPl Elph-Mcnc: 3.7 g/dL (ref 2.9–4.4)
Alpha 1: 0.2 g/dL (ref 0.0–0.4)
Alpha2 Glob SerPl Elph-Mcnc: 0.6 g/dL (ref 0.4–1.0)
B-Globulin SerPl Elph-Mcnc: 0.9 g/dL (ref 0.7–1.3)
Gamma Glob SerPl Elph-Mcnc: 3 g/dL — ABNORMAL HIGH (ref 0.4–1.8)
Globulin, Total: 4.8 g/dL — ABNORMAL HIGH (ref 2.2–3.9)
IGA/IMMUNOGLOBULIN A, SERUM: 146 mg/dL (ref 64–422)
IGM (IMMUNOGLOBIN M), SRM: 130 mg/dL (ref 26–217)
IgG, Qn, Serum: 3145 mg/dL — ABNORMAL HIGH (ref 700–1600)
M Protein SerPl Elph-Mcnc: 2.1 g/dL — ABNORMAL HIGH
TOTAL PROTEIN: 8.5 g/dL (ref 6.0–8.5)

## 2015-12-21 LAB — BETA 2 MICROGLOBULIN, SERUM: BETA 2: 1.9 mg/L (ref 0.6–2.4)

## 2015-12-23 NOTE — Assessment & Plan Note (Signed)
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 Based on these labs there is no evidence of end organ damage and hence does not fit the criteria for multiple myeloma.   Plan: 1. F/U in 1 year with labs Patient lives in [place] 2. If patient develops anemia, or hypercalcemia or renal insufficiency, we need to see sooner 3. Leg swelling: I encouraged her to see her primary care physician. I discussed with her about decreasing salt intake. 4. Hypertension: Patient reports that this is white coat hypertension.  Return to clinic in October for follow-up with blood work done ahead of time

## 2015-12-24 ENCOUNTER — Ambulatory Visit (HOSPITAL_BASED_OUTPATIENT_CLINIC_OR_DEPARTMENT_OTHER): Payer: Medicare Other | Admitting: Hematology and Oncology

## 2015-12-24 ENCOUNTER — Encounter: Payer: Self-pay | Admitting: Hematology and Oncology

## 2015-12-24 DIAGNOSIS — D472 Monoclonal gammopathy: Secondary | ICD-10-CM | POA: Diagnosis present
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
## 2016-12-15 ENCOUNTER — Other Ambulatory Visit: Payer: Medicare Other

## 2016-12-22 ENCOUNTER — Ambulatory Visit: Payer: Medicare Other | Admitting: Hematology and Oncology

## 2017-08-28 ENCOUNTER — Other Ambulatory Visit: Payer: Self-pay

## 2017-08-28 ENCOUNTER — Emergency Department (HOSPITAL_COMMUNITY): Payer: Medicare Other

## 2017-08-28 ENCOUNTER — Inpatient Hospital Stay (HOSPITAL_COMMUNITY): Payer: Medicare Other

## 2017-08-28 ENCOUNTER — Inpatient Hospital Stay (HOSPITAL_COMMUNITY)
Admission: EM | Admit: 2017-08-28 | Discharge: 2017-09-02 | DRG: 291 | Disposition: A | Discharge status: Home / Self Care | Payer: Medicare Other | Attending: Family Medicine | Admitting: Family Medicine

## 2017-08-28 ENCOUNTER — Encounter (HOSPITAL_COMMUNITY): Payer: Self-pay | Admitting: Emergency Medicine

## 2017-08-28 DIAGNOSIS — N183 Chronic kidney disease, stage 3 unspecified: Secondary | ICD-10-CM | POA: Diagnosis present

## 2017-08-28 DIAGNOSIS — D472 Monoclonal gammopathy: Secondary | ICD-10-CM | POA: Diagnosis present

## 2017-08-28 DIAGNOSIS — N179 Acute kidney failure, unspecified: Secondary | ICD-10-CM

## 2017-08-28 DIAGNOSIS — R0609 Other forms of dyspnea: Secondary | ICD-10-CM | POA: Diagnosis present

## 2017-08-28 DIAGNOSIS — I4891 Unspecified atrial fibrillation: Secondary | ICD-10-CM | POA: Diagnosis not present

## 2017-08-28 DIAGNOSIS — I509 Heart failure, unspecified: Secondary | ICD-10-CM | POA: Diagnosis not present

## 2017-08-28 DIAGNOSIS — I340 Nonrheumatic mitral (valve) insufficiency: Secondary | ICD-10-CM | POA: Diagnosis not present

## 2017-08-28 DIAGNOSIS — I482 Chronic atrial fibrillation: Secondary | ICD-10-CM | POA: Diagnosis present

## 2017-08-28 DIAGNOSIS — I5043 Acute on chronic combined systolic (congestive) and diastolic (congestive) heart failure: Secondary | ICD-10-CM | POA: Diagnosis present

## 2017-08-28 DIAGNOSIS — R0602 Shortness of breath: Secondary | ICD-10-CM

## 2017-08-28 DIAGNOSIS — Z6841 Body Mass Index (BMI) 40.0 and over, adult: Secondary | ICD-10-CM | POA: Diagnosis not present

## 2017-08-28 DIAGNOSIS — Z7901 Long term (current) use of anticoagulants: Secondary | ICD-10-CM | POA: Diagnosis not present

## 2017-08-28 DIAGNOSIS — R609 Edema, unspecified: Secondary | ICD-10-CM

## 2017-08-28 DIAGNOSIS — I429 Cardiomyopathy, unspecified: Secondary | ICD-10-CM | POA: Diagnosis present

## 2017-08-28 DIAGNOSIS — Z8249 Family history of ischemic heart disease and other diseases of the circulatory system: Secondary | ICD-10-CM

## 2017-08-28 DIAGNOSIS — E66813 Obesity, class 3: Secondary | ICD-10-CM | POA: Diagnosis present

## 2017-08-28 DIAGNOSIS — E1122 Type 2 diabetes mellitus with diabetic chronic kidney disease: Secondary | ICD-10-CM | POA: Diagnosis present

## 2017-08-28 DIAGNOSIS — E876 Hypokalemia: Secondary | ICD-10-CM | POA: Diagnosis present

## 2017-08-28 DIAGNOSIS — I361 Nonrheumatic tricuspid (valve) insufficiency: Secondary | ICD-10-CM | POA: Diagnosis not present

## 2017-08-28 DIAGNOSIS — I252 Old myocardial infarction: Secondary | ICD-10-CM | POA: Diagnosis not present

## 2017-08-28 DIAGNOSIS — I161 Hypertensive emergency: Secondary | ICD-10-CM | POA: Diagnosis present

## 2017-08-28 DIAGNOSIS — I10 Essential (primary) hypertension: Secondary | ICD-10-CM

## 2017-08-28 DIAGNOSIS — Z79899 Other long term (current) drug therapy: Secondary | ICD-10-CM | POA: Diagnosis not present

## 2017-08-28 DIAGNOSIS — I130 Hypertensive heart and chronic kidney disease with heart failure and stage 1 through stage 4 chronic kidney disease, or unspecified chronic kidney disease: Secondary | ICD-10-CM | POA: Diagnosis present

## 2017-08-28 DIAGNOSIS — I481 Persistent atrial fibrillation: Secondary | ICD-10-CM | POA: Diagnosis present

## 2017-08-28 DIAGNOSIS — Z833 Family history of diabetes mellitus: Secondary | ICD-10-CM | POA: Diagnosis not present

## 2017-08-28 DIAGNOSIS — I4819 Other persistent atrial fibrillation: Secondary | ICD-10-CM

## 2017-08-28 DIAGNOSIS — E875 Hyperkalemia: Secondary | ICD-10-CM | POA: Diagnosis present

## 2017-08-28 DIAGNOSIS — I5021 Acute systolic (congestive) heart failure: Secondary | ICD-10-CM | POA: Diagnosis not present

## 2017-08-28 HISTORY — DX: Essential (primary) hypertension: I10

## 2017-08-28 HISTORY — DX: Unspecified atrial fibrillation: I48.91

## 2017-08-28 LAB — HEMOGLOBIN A1C
Hgb A1c MFr Bld: 7.1 % — ABNORMAL HIGH (ref 4.8–5.6)
MEAN PLASMA GLUCOSE: 157.07 mg/dL

## 2017-08-28 LAB — HEPATIC FUNCTION PANEL
ALK PHOS: 68 U/L (ref 38–126)
ALT: 24 U/L (ref 0–44)
AST: 29 U/L (ref 15–41)
Albumin: 3.5 g/dL (ref 3.5–5.0)
BILIRUBIN INDIRECT: 1.1 mg/dL — AB (ref 0.3–0.9)
Bilirubin, Direct: 0.5 mg/dL — ABNORMAL HIGH (ref 0.0–0.2)
TOTAL PROTEIN: 8.3 g/dL — AB (ref 6.5–8.1)
Total Bilirubin: 1.6 mg/dL — ABNORMAL HIGH (ref 0.3–1.2)

## 2017-08-28 LAB — URINALYSIS, ROUTINE W REFLEX MICROSCOPIC
BILIRUBIN URINE: NEGATIVE
Bacteria, UA: NONE SEEN
Glucose, UA: NEGATIVE mg/dL
KETONES UR: NEGATIVE mg/dL
LEUKOCYTES UA: NEGATIVE
Nitrite: NEGATIVE
Protein, ur: NEGATIVE mg/dL
SPECIFIC GRAVITY, URINE: 1.004 — AB (ref 1.005–1.030)
pH: 7 (ref 5.0–8.0)

## 2017-08-28 LAB — BASIC METABOLIC PANEL
Anion gap: 11 (ref 5–15)
BUN: 20 mg/dL (ref 8–23)
CHLORIDE: 94 mmol/L — AB (ref 98–111)
CO2: 32 mmol/L (ref 22–32)
CREATININE: 1.18 mg/dL — AB (ref 0.44–1.00)
Calcium: 8.9 mg/dL (ref 8.9–10.3)
GFR calc non Af Amer: 44 mL/min — ABNORMAL LOW (ref >60)
GFR, EST AFRICAN AMERICAN: 51 mL/min — AB (ref >60)
GLUCOSE: 145 mg/dL — AB (ref 70–99)
Potassium: 3.2 mmol/L — ABNORMAL LOW (ref 3.5–5.1)
Sodium: 137 mmol/L (ref 135–145)

## 2017-08-28 LAB — BRAIN NATRIURETIC PEPTIDE: B Natriuretic Peptide: 391.1 pg/mL — ABNORMAL HIGH (ref 0.0–100.0)

## 2017-08-28 LAB — CBC
HCT: 48.9 % — ABNORMAL HIGH (ref 36.0–46.0)
Hemoglobin: 15.6 g/dL — ABNORMAL HIGH (ref 12.0–15.0)
MCH: 30.7 pg (ref 26.0–34.0)
MCHC: 31.9 g/dL (ref 30.0–36.0)
MCV: 96.3 fL (ref 78.0–100.0)
PLATELETS: 165 10*3/uL (ref 150–400)
RBC: 5.08 MIL/uL (ref 3.87–5.11)
RDW: 14.5 % (ref 11.5–15.5)
WBC: 3.5 10*3/uL — ABNORMAL LOW (ref 4.0–10.5)

## 2017-08-28 LAB — TROPONIN I
TROPONIN I: 0.04 ng/mL — AB (ref <0.03)
Troponin I: 0.04 ng/mL (ref <0.03)

## 2017-08-28 LAB — ECHOCARDIOGRAM COMPLETE
Height: 62 in
Weight: 3497.6 oz

## 2017-08-28 LAB — LIPID PANEL
Cholesterol: 126 mg/dL (ref 0–200)
HDL: 38 mg/dL — AB (ref >40)
LDL Cholesterol: 78 mg/dL (ref 0–99)
TRIGLYCERIDES: 51 mg/dL (ref <150)
Total CHOL/HDL Ratio: 3.3 RATIO
VLDL: 10 mg/dL (ref 0–40)

## 2017-08-28 LAB — TSH: TSH: 1.876 u[IU]/mL (ref 0.350–4.500)

## 2017-08-28 LAB — MAGNESIUM: Magnesium: 2 mg/dL (ref 1.7–2.4)

## 2017-08-28 LAB — GLUCOSE, CAPILLARY
GLUCOSE-CAPILLARY: 151 mg/dL — AB (ref 70–99)
GLUCOSE-CAPILLARY: 159 mg/dL — AB (ref 70–99)
Glucose-Capillary: 144 mg/dL — ABNORMAL HIGH (ref 70–99)

## 2017-08-28 LAB — I-STAT TROPONIN, ED: Troponin i, poc: 0.05 ng/mL (ref 0.00–0.08)

## 2017-08-28 LAB — LIPASE, BLOOD: Lipase: 33 U/L (ref 11–51)

## 2017-08-28 MED ORDER — METOPROLOL TARTRATE 50 MG PO TABS
50.0000 mg | ORAL_TABLET | Freq: Two times a day (BID) | ORAL | Status: DC
  Administered 2017-08-28 – 2017-09-02 (×10): 50 mg via ORAL
  Filled 2017-08-28 (×10): qty 1

## 2017-08-28 MED ORDER — LOSARTAN POTASSIUM 50 MG PO TABS
50.0000 mg | ORAL_TABLET | Freq: Every day | ORAL | Status: DC
  Administered 2017-08-28 – 2017-08-29 (×2): 50 mg via ORAL
  Filled 2017-08-28 (×2): qty 1

## 2017-08-28 MED ORDER — POTASSIUM CHLORIDE CRYS ER 20 MEQ PO TBCR
40.0000 meq | EXTENDED_RELEASE_TABLET | Freq: Two times a day (BID) | ORAL | Status: DC
  Administered 2017-08-28 – 2017-08-29 (×4): 40 meq via ORAL
  Filled 2017-08-28 (×3): qty 2

## 2017-08-28 MED ORDER — APIXABAN 5 MG PO TABS
5.0000 mg | ORAL_TABLET | Freq: Two times a day (BID) | ORAL | Status: DC
  Administered 2017-08-28 – 2017-09-02 (×10): 5 mg via ORAL
  Filled 2017-08-28 (×11): qty 1

## 2017-08-28 MED ORDER — INSULIN ASPART 100 UNIT/ML ~~LOC~~ SOLN
0.0000 [IU] | SUBCUTANEOUS | Status: DC
  Administered 2017-08-28: 1 [IU] via SUBCUTANEOUS
  Administered 2017-08-28 – 2017-08-29 (×2): 2 [IU] via SUBCUTANEOUS

## 2017-08-28 MED ORDER — FUROSEMIDE 20 MG PO TABS: 40.0000 mg | ORAL_TABLET | Freq: Every day | ORAL | Status: DC

## 2017-08-28 MED ORDER — METOLAZONE 5 MG PO TABS: 5.0000 mg | ORAL_TABLET | Freq: Every day | ORAL | Status: DC

## 2017-08-28 MED ORDER — DILTIAZEM HCL ER 240 MG PO CP24: 240.0000 mg | ORAL_CAPSULE | Freq: Every day | ORAL | Status: DC

## 2017-08-28 MED ORDER — FUROSEMIDE 10 MG/ML IJ SOLN
80.0000 mg | Freq: Two times a day (BID) | INTRAMUSCULAR | Status: DC
  Administered 2017-08-28: 80 mg via INTRAVENOUS
  Filled 2017-08-28: qty 8

## 2017-08-28 MED ORDER — LABETALOL HCL 5 MG/ML IV SOLN: 10.0000 mg | INTRAVENOUS | Status: DC | PRN

## 2017-08-28 NOTE — Consult Note (Signed)
Cardiology Consultation     Patient ID: [name] Long, [id number], Oct 06, 1940 Admit date: 08/28/2017 Date of Consult: 08/28/2017  Primary Physician: No PCP per chart review. Primary Cardiologist:  Referring Physician: Dr. Nevada Crane  Chief Complaint: Worsening lower extremity swelling and shortness of breath. Reason for Consultation: A. fib with RVR.  HPI: This is a 77 y.o. 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 fib on Eliquis, HTN  who presented to the ED from home (where she is visiting her daughter. She resides in the [employer]) with complaints of worsening lower extremity edema and shortness of breath for 2 months.  According to patient she developed lower extremity edema and shortness of breath earlier this year, was seen by cardiologist back home and was diagnosed with A. fib.  At that time she was also told that her heart is little weak, she was placed on Eliquis, diltiazem, Lasix and metolazone.  According to patient she is compliant with her medications.  Initially her symptoms improved for some time, now for the past 2 to 68-monthis experiencing worsening shortness of breath to the point that she cannot do much around the house.  Her lower extremity edema is worsening and she is gaining weight.  She does experience occasional burning type of chest pain, no relationship with any exertion, nonradiating.  Patient has not noticed any aggravating or alleviating factors.  She denies any palpitations, dizziness, diaphoresis, orthopnea or PND.  She sleeps on one pillow and there is no recent change.  According to patient her blood pressure mostly stays elevated, she also has diet-controlled diabetes.  She does add salt to her diet and do eat outside at fast food occasionally.  In the ED she was found to be in A. fib with RVR and cardiology was consulted to help with her rhythm and CHF.  Initial troponin was negative,  elevated BNP  at 391, mildly elevated bilirubin and creatinine at 1.18 with unknown baseline.   PMHx:  CARDIAC HISTORY:  Echo:  08/28/17.:   Past Medical History:  Diagnosis Date  . [place]rial [place]   . Hypertension    Past Surgical History:  Procedure Laterality Date  . CESAREAN SECTION      FAMHx: No family history on file.  SOCHx:  reports that she has never smoked. She has never used smokeless tobacco. She reports that she does not drink alcohol or use drugs.  ALLERGIES: No Known Allergies   HOME MEDICATIONS: Medications Prior to Admission  Medication Sig Dispense Refill Last Dose  . apixaban (ELIQUIS) 5 MG TABS tablet Take 5 mg by mouth 2 (two) times daily.   08/27/2017 at 1130a  . brimonidine (ALPHAGAN) 0.2 % ophthalmic solution Place 1 drop into both eyes 2 (two) times daily.   08/27/2017 at Unknown time  . diltiazem (DILACOR XR) 240 MG 24 hr capsule Take 240 mg by mouth daily.   08/28/2017 at Unknown time  . furosemide (LASIX) 40 MG tablet Take 40 mg by mouth daily as needed for edema or shortness of breath.   1 08/27/2017 at Unknown time  . latanoprost (XALATAN) 0.005 % ophthalmic solution Place 1 drop into both eyes at bedtime.   08/27/2017 at Unknown time  . losartan (COZAAR) 100 MG tablet Take 100 mg by mouth daily.   08/28/2017 at Unknown time  . metolazone (ZAROXOLYN) 5 MG tablet Take 5 mg by mouth daily. FOR 7 DAYS ONLY   08/27/2017 at Unknown  time    HOSPITAL MEDICATIONS: . apixaban  5 mg Oral BID  . [START ON 08/29/2017] diltiazem  240 mg Oral Daily  . furosemide  40 mg Oral Daily  . insulin aspart  0-9 Units Subcutaneous Q4H  . [START ON 08/29/2017] metolazone  5 mg Oral Daily  . potassium chloride  40 mEq Oral BID    ROS General: Negative; No fevers, chills, or night sweats;  HEENT: Negative; No changes in vision or hearing, sinus congestion, difficulty swallowing Pulmonary: Negative; No cough, wheezing, shortness of breath, hemoptysis Cardiovascular:  chest  pain, no presyncope, syncope, palpitations GI: Negative; No nausea, vomiting, diarrhea, or abdominal pain GU: Negative; No dysuria, hematuria, or difficulty voiding Musculoskeletal: Negative; no myalgias, joint pain, or weakness Hematologic/Oncology: Negative; no easy bruising, bleeding Endocrine: Negative; no heat/cold intolerance; no diabetes Neuro: Negative; no changes in balance, headaches Psychiatric: Negative; No behavioral problems, depression Other comprehensive 14 point system review is negative.  VITALS: Blood pressure (!) 178/124, pulse 67, temperature 98.5 F (36.9 C), temperature source Oral, resp. rate 18, height 5' 2"  (1.575 m), weight 218 lb 9.6 oz (99.2 kg), SpO2 99 %.  PHYSICAL EXAM: Vitals:   08/28/17 1245 08/28/17 1300 08/28/17 1333 08/28/17 1335  BP: (!) 154/65 133/71  (!) 178/124  Pulse: 79 (!) 40  67  Resp: (!) 21 15  18   Temp:    98.5 F (36.9 C)  TempSrc:    Oral  SpO2: 97% 95%  99%  Weight:   218 lb 9.6 oz (99.2 kg)   Height:   5' 2"  (1.575 m)    General: Vital signs reviewed.  Patient is well-developed and well-nourished, in no acute distress and cooperative with exam.  Head: Normocephalic and atraumatic. Eyes: EOMI, conjunctivae normal, no scleral icterus.  Neck: Supple, trachea midline, normal ROM,  JVD,  Cardiovascular: Irregularly irregular rhythm. Pulmonary/Chest: Clear to auscultation bilaterally, no wheezes, rales, or rhonchi. Abdominal: Soft, non-tender, non-distended, BS +,  Extremities: 3+ bilateral lower extremity edema up to thighs, including abdominal wall. Neurological: A&O x3, Strength is normal and symmetric bilaterally, cranial nerve II-XII are grossly intact, no focal motor deficit, sensory intact to light touch bilaterally.  Psychiatric: Normal mood and affect. speech and behavior is normal. Cognition and memory are normal.  ECG (independently read by me): A. fib with RVR and multiple PVCs.  LABS: Results for orders placed or  performed during the hospital encounter of 08/28/17 (from the past 48 hour(s))  Basic metabolic panel     Status: Abnormal   Collection Time: 08/28/17  7:49 AM  Result Value Ref Range   Sodium 137 135 - 145 mmol/L   Potassium 3.2 (L) 3.5 - 5.1 mmol/L   Chloride 94 (L) 98 - 111 mmol/L   CO2 32 22 - 32 mmol/L   Glucose, Bld 145 (H) 70 - 99 mg/dL   BUN 20 8 - 23 mg/dL   Creatinine, Ser 1.18 (H) 0.44 - 1.00 mg/dL   Calcium 8.9 8.9 - 10.3 mg/dL   GFR calc non Af Amer 44 (L) >60 mL/min   GFR calc Af Amer 51 (L) >60 mL/min    Comment: (NOTE) The eGFR has been calculated using the CKD EPI equation. This calculation has not been validated in all clinical situations. eGFR's persistently <60 mL/min signify possible Chronic Kidney Disease.    Anion gap 11 5 - 15    Comment: Performed at [facility] [address] [place], [place] [address]  CBC  Status: Abnormal   Collection Time: 08/28/17  7:49 AM  Result Value Ref Range   WBC 3.5 (L) 4.0 - 10.5 K/uL   RBC 5.08 3.87 - 5.11 MIL/uL   Hemoglobin 15.6 (H) 12.0 - 15.0 g/dL   HCT 48.9 (H) 36.0 - 46.0 %   MCV 96.3 78.0 - 100.0 fL   MCH 30.7 26.0 - 34.0 pg   MCHC 31.9 30.0 - 36.0 g/dL   RDW 14.5 11.5 - 15.5 %   Platelets 165 150 - 400 K/uL    Comment: Performed at [facility], [place] [address], [place], [place] [address]  Brain natriuretic peptide     Status: Abnormal   Collection Time: 08/28/17  7:49 AM  Result Value Ref Range   B Natriuretic Peptide 391.1 (H) 0.0 - 100.0 pg/mL    Comment: Performed at [facility] [address], [place], [place] [address]  I-stat troponin, ED     Status: None   Collection Time: 08/28/17  7:53 AM  Result Value Ref Range   Troponin i, poc 0.05 0.00 - 0.08 ng/mL   Comment 3            Comment: Due to the release kinetics of cTnI, a negative result within the first hours of the onset of symptoms does not rule out myocardial infarction with certainty. If myocardial infarction is  still suspected, repeat the test at appropriate intervals.   Hepatic function panel     Status: Abnormal   Collection Time: 08/28/17  8:07 AM  Result Value Ref Range   Total Protein 8.3 (H) 6.5 - 8.1 g/dL   Albumin 3.5 3.5 - 5.0 g/dL   AST 29 15 - 41 U/L   ALT 24 0 - 44 U/L    Comment: Please note change in reference range.   Alkaline Phosphatase 68 38 - 126 U/L   Total Bilirubin 1.6 (H) 0.3 - 1.2 mg/dL   Bilirubin, Direct 0.5 (H) 0.0 - 0.2 mg/dL    Comment: Please note change in reference range.   Indirect Bilirubin 1.1 (H) 0.3 - 0.9 mg/dL    Comment: Performed at [facility], [place] [address], [place], [place] [address]  Lipase, blood     Status: None   Collection Time: 08/28/17  8:07 AM  Result Value Ref Range   Lipase 33 11 - 51 U/L    Comment: Performed at [facility] [address], [place], [place] [address]  Magnesium     Status: None   Collection Time: 08/28/17  8:07 AM  Result Value Ref Range   Magnesium 2.0 1.7 - 2.4 mg/dL    Comment: Performed at [facility] [address] [place],  [address]  Urinalysis, Routine w reflex microscopic     Status: Abnormal   Collection Time: 08/28/17  8:40 AM  Result Value Ref Range   Color, Urine STRAW (A) YELLOW   APPearance CLEAR CLEAR   Specific Gravity, Urine 1.004 (L) 1.005 - 1.030   pH 7.0 5.0 - 8.0   Glucose, UA NEGATIVE NEGATIVE mg/dL   Hgb urine dipstick LARGE (A) NEGATIVE   Bilirubin Urine NEGATIVE NEGATIVE   Ketones, ur NEGATIVE NEGATIVE mg/dL   Protein, ur NEGATIVE NEGATIVE mg/dL   Nitrite NEGATIVE NEGATIVE   Leukocytes, UA NEGATIVE NEGATIVE   RBC / HPF 0-5 0 - 5 RBC/hpf   WBC, UA 0-5 0 - 5 WBC/hpf   Bacteria, UA NONE SEEN NONE  SEEN   Squamous Epithelial / LPF 0-5 0 - 5   Mucus PRESENT     Comment: Performed at [facility], [place] [address], [place],  [address]  Glucose, capillary     Status: Abnormal   Collection Time: 08/28/17  1:41 PM  Result Value Ref Range    Glucose-Capillary 151 (H) 70 - 99 mg/dL    IMAGING: Dg Chest 2 View  Result Date: 08/28/2017 CLINICAL DATA:  Pt reports central chest pain x2 days. Pt endorses some sob and leg swelling. Hx of afib per pt. Hx of HTN. Nonsmoker. EXAM: CHEST - 2 VIEW COMPARISON:  None. FINDINGS: Enlarged cardiac silhouette. RIGHT hilar fullness. No effusion, infiltrate or pneumothorax. No acute osseous abnormality. IMPRESSION: 1. Cardiomegaly without acute cardiopulmonary findings. 2. RIGHT hilar prominence may represent adenopathy versus prominent vascular structures. No comparison available. Recommend CT thorax without contrast for further evaluation Electronically Signed   By: Suzy Bouchard M.D.   On: 08/28/2017 08:51   Ct Chest Wo Contrast  Result Date: 08/28/2017 CLINICAL DATA:  Central chest pain for the past 2 days with shortness of breath. Prominent right hilum on chest x-ray. EXAM: CT CHEST WITHOUT CONTRAST TECHNIQUE: Multidetector CT imaging of the chest was performed following the standard protocol without IV contrast. COMPARISON:  Chest x-ray from same day. FINDINGS: Cardiovascular: Moderate cardiomegaly. No pericardial effusion. Normal caliber thoracic aorta. Coronary, aortic arch, and branch vessel atherosclerotic vascular disease. Mediastinum/Nodes: No enlarged mediastinal or axillary lymph nodes. Evaluation for hilar lymphadenopathy is limited without intravenous contrast. 1.3 cm hypodense nodule in the right thyroid lobe. The trachea and esophagus demonstrate no significant findings. Lungs/Pleura: No focal consolidation, pleural effusion, or pneumothorax. There are a few small scattered pulmonary nodules in both lungs, the two largest of which measure 6 x 4 mm in the right upper lobe (series 4, image 66) and left lower lobe (series 4, image 93). Upper Abdomen: No acute abnormality. 2.5 cm right adrenal adenoma versus exophytic upper pole right renal cyst. Musculoskeletal: No chest wall mass or suspicious  bone lesions identified. Mild diffuse subcutaneous chest wall edema. IMPRESSION: 1. Evaluation is limited without intravenous contrast, however right hilar fullness seen on chest x-ray most likely corresponds to prominent vascular structures. 2. Few scattered small pulmonary nodules in both lungs, the largest of which measure 5 mm in aggregate. No follow-up needed if patient is low-risk (and has no known or suspected primary neoplasm). Non-contrast chest CT can be considered in 12 months if patient is high-risk. This recommendation follows the consensus statement: Guidelines for Management of Incidental Pulmonary Nodules Detected on CT Images: From the Fleischner Society 2017; Radiology 2017; 284:228-243. 3. Moderate cardiomegaly. 4. Aortic atherosclerosis (ICD10-I70.0). Electronically Signed   By: Titus Dubin M.D.   On: 08/28/2017 09:49    IMPRESSION: Patient with history of A. fib with RVR and apparently decreased cardiac function since February 2019.  Preliminary review of echo films with Dr. Martinique shows diffuse hypokinesia and decreased ejection fraction.  No previous ischemic work-up.  RECOMMENDATION: -Discontinue diltiazem because of reduced LV function. -Discontinue oral Lasix and metolazone. -Start her on metoprolol 50 mg twice daily-we will  titrate according to her response. -Losartan 50 mg daily. -IV Lasix 80 mg twice daily. -Continue Eliquis. -Strict intake and output. -Daily weight. -Monitor renal function daily. -TSH and troponin pending. -Might need addition of another antiarrhythmic. -Will need better control of hypertension. -Low-sodium/carb modified diet with fluid restriction to 1200 cc/day.  DM.  A1c pending. -  Management per primary team.  Lorella Nimrod MD PGY2 08/28/2017 3:12 PM

## 2017-08-28 NOTE — ED Notes (Signed)
Ambulated pt around Pod B and C. Pt Sp02 starting at 98%. Upon ambulation about 53ft pt SpO2 dropped to 93% and became tachycardic of 130 .Pt did state she was a bit tired and short of breath. Pt tolerated fair with slow but steady gait.

## 2017-08-28 NOTE — Plan of Care (Signed)
  Problem: Education: Goal: Knowledge of General Education information will improve Outcome: Progressing   Problem: Health Behavior/Discharge Planning: Goal: Ability to manage health-related needs will improve Outcome: Progressing   Problem: Activity: Goal: Risk for activity intolerance will decrease Outcome: Progressing   Problem: Pain Managment: Goal: General experience of comfort will improve Outcome: Progressing   Problem: Safety: Goal: Ability to remain free from injury will improve Outcome: Progressing   

## 2017-08-28 NOTE — ED Provider Notes (Signed)
[facility] EMERGENCY DEPARTMENT Provider Note   CSN: [id number] Arrival date & time: 08/28/17  5400     History   Chief Complaint Chief Complaint  Patient presents with  . Chest Pain    HPI [name] Long is a 77 y.o. female.  The history is provided by the patient and medical records. No language interpreter was used.  Shortness of Breath  This is a new problem. The average episode lasts 3 days. The problem occurs continuously.The current episode started more than 2 days ago. The problem has been gradually worsening. Associated symptoms include orthopnea, chest pain and leg swelling. Pertinent negatives include no fever, no headaches, no rhinorrhea, no cough, no sputum production, no wheezing, no syncope, no vomiting, no abdominal pain, no rash and no leg pain. She has tried nothing for the symptoms. The treatment provided no relief. 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 Associated medical issues do not include asthma, COPD, pneumonia, chronic lung disease or DVT.    History reviewed. No pertinent past medical history.  Patient Active Problem List   Diagnosis Date Noted  . Hypercholesterolemia 12/17/2015  . Routine health maintenance 07/23/2015  . 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   OB History   None      Home Medications    Prior to Admission medications   Not on File    Family History No family history on file.  Social History Social History   Tobacco Use  . Smoking status: Never Smoker  . Smokeless tobacco: Never Used  Substance Use Topics  . Alcohol use: Not on file  . Drug use: Not on file     Allergies   Patient has no known allergies.   Review of Systems Review of Systems  Constitutional: Negative for chills, diaphoresis, fatigue and fever.  HENT: Negative for congestion and rhinorrhea.   Respiratory: Positive for chest tightness and shortness  of breath. Negative for cough, sputum production, wheezing and stridor.   Cardiovascular: Positive for chest pain, orthopnea and leg swelling. Negative for syncope.  Gastrointestinal: Negative for abdominal pain, constipation, diarrhea, nausea and vomiting.  Genitourinary: Negative for dysuria.  Musculoskeletal: Negative for back pain.  Skin: Negative for rash.  Neurological: Negative for light-headedness and headaches.  Psychiatric/Behavioral: Negative for agitation.  All other systems reviewed and are negative.    Physical Exam Updated Vital Signs There were no vitals taken for this visit.  Physical Exam  Constitutional: She appears well-developed and well-nourished. No distress.  HENT:  Head: Normocephalic and atraumatic.  Mouth/Throat: No oropharyngeal exudate.  Eyes: Pupils are equal, round, and reactive to light. Conjunctivae and EOM are normal.  Neck: Normal range of motion. Neck supple.  Cardiovascular: Normal rate and regular rhythm.  Murmur heard. Pulmonary/Chest: Effort normal and breath sounds normal. No respiratory distress. She has no wheezes. She exhibits no tenderness.  Abdominal: Soft. She exhibits no distension. There is no tenderness.  Musculoskeletal: She exhibits edema. She exhibits no tenderness.  Neurological: She is alert. No sensory deficit. She exhibits normal muscle tone.  Skin: Skin is warm and dry. Capillary refill takes less than 2 seconds. Rash (RLE) noted. She is not diaphoretic. No erythema.  Psychiatric: She has a normal mood and affect.  Nursing note and vitals reviewed.    ED Treatments / Results  Labs (all labs ordered are listed, but only abnormal results are displayed) Labs Reviewed  BASIC METABOLIC PANEL - Abnormal;  Notable for the following components:      Result Value   Potassium 3.2 (*)    Chloride 94 (*)    Glucose, Bld 145 (*)    Creatinine, Ser 1.18 (*)    GFR calc non Af Amer 44 (*)    GFR calc Af Amer 51 (*)    All other  components within normal limits  CBC - Abnormal; Notable for the following components:   WBC 3.5 (*)    Hemoglobin 15.6 (*)    HCT 48.9 (*)    All other components within normal limits  BRAIN NATRIURETIC PEPTIDE - Abnormal; Notable for the following components:   B Natriuretic Peptide 391.1 (*)    All other components within normal limits  HEPATIC FUNCTION PANEL - Abnormal; Notable for the following components:   Total Protein 8.3 (*)    Total Bilirubin 1.6 (*)    Bilirubin, Direct 0.5 (*)    Indirect Bilirubin 1.1 (*)    All other components within normal limits  URINALYSIS, ROUTINE W REFLEX MICROSCOPIC - Abnormal; Notable for the following components:   Color, Urine STRAW (*)    Specific Gravity, Urine 1.004 (*)    Hgb urine dipstick LARGE (*)    All other components within normal limits  LIPID PANEL - Abnormal; Notable for the following components:   HDL 38 (*)    All other components within normal limits  GLUCOSE, CAPILLARY - Abnormal; Notable for the following components:   Glucose-Capillary 151 (*)    All other components within normal limits  URINE CULTURE  LIPASE, BLOOD  MAGNESIUM  TSH  TROPONIN I  TROPONIN I  HEMOGLOBIN A1C  I-STAT TROPONIN, ED    EKG EKG Interpretation  Date/Time:  Tuesday August 28 2017 07:38:36 EDT Ventricular Rate:  104 PR Interval:    QRS Duration: 104 QT Interval:  394 QTC Calculation: 423 R Axis:   -96 Text Interpretation:  Atrial fibrillation Paired ventricular premature complexes Inferior infarct, old Anterior infarct, old No prior ECG for comparison.  Afib vs artifact with bigeminy.  No STEMI Confirmed by Antony Blackbird [phone]) on 08/28/2017 7:44:58 AM Also confirmed by Antony Blackbird [phone]), editor Lynder Parents [phone])  on 08/28/2017 8:04:06 AM   Radiology Dg Chest 2 View  Result Date: 08/28/2017 CLINICAL DATA:  Pt reports central chest pain x2 days. Pt endorses some sob and leg swelling. Hx of afib per pt. Hx of HTN.  Nonsmoker. EXAM: CHEST - 2 VIEW COMPARISON:  None. FINDINGS: Enlarged cardiac silhouette. RIGHT hilar fullness. No effusion, infiltrate or pneumothorax. No acute osseous abnormality. IMPRESSION: 1. Cardiomegaly without acute cardiopulmonary findings. 2. RIGHT hilar prominence may represent adenopathy versus prominent vascular structures. No comparison available. Recommend CT thorax without contrast for further evaluation Electronically Signed   By: Suzy Bouchard M.D.   On: 08/28/2017 08:51   Ct Chest Wo Contrast  Result Date: 08/28/2017 CLINICAL DATA:  Central chest pain for the past 2 days with shortness of breath. Prominent right hilum on chest x-ray. EXAM: CT CHEST WITHOUT CONTRAST TECHNIQUE: Multidetector CT imaging of the chest was performed following the standard protocol without IV contrast. COMPARISON:  Chest x-ray from same day. FINDINGS: Cardiovascular: Moderate cardiomegaly. No pericardial effusion. Normal caliber thoracic aorta. Coronary, aortic arch, and branch vessel atherosclerotic vascular disease. Mediastinum/Nodes: No enlarged mediastinal or axillary lymph nodes. Evaluation for hilar lymphadenopathy is limited without intravenous contrast. 1.3 cm hypodense nodule in the right thyroid lobe. The trachea and esophagus demonstrate no significant findings.  Lungs/Pleura: No focal consolidation, pleural effusion, or pneumothorax. There are a few small scattered pulmonary nodules in both lungs, the two largest of which measure 6 x 4 mm in the right upper lobe (series 4, image 66) and left lower lobe (series 4, image 93). Upper Abdomen: No acute abnormality. 2.5 cm right adrenal adenoma versus exophytic upper pole right renal cyst. Musculoskeletal: No chest wall mass or suspicious bone lesions identified. Mild diffuse subcutaneous chest wall edema. IMPRESSION: 1. Evaluation is limited without intravenous contrast, however right hilar fullness seen on chest x-ray most likely corresponds to prominent  vascular structures. 2. Few scattered small pulmonary nodules in both lungs, the largest of which measure 5 mm in aggregate. No follow-up needed if patient is low-risk (and has no known or suspected primary neoplasm). Non-contrast chest CT can be considered in 12 months if patient is high-risk. This recommendation follows the consensus statement: Guidelines for Management of Incidental Pulmonary Nodules Detected on CT Images: From the Fleischner Society 2017; Radiology 2017; 284:228-243. 3. Moderate cardiomegaly. 4. Aortic atherosclerosis (ICD10-I70.0). Electronically Signed   By: Titus Dubin M.D.   On: 08/28/2017 09:49    Procedures Procedures (including critical care time)    Medications Ordered in ED Medications  potassium chloride SA (K-DUR,KLOR-CON) CR tablet 40 mEq (40 mEq Oral Given 08/28/17 1302)  apixaban (ELIQUIS) tablet 5 mg (has no administration in time range)  labetalol (NORMODYNE,TRANDATE) injection 10 mg (has no administration in time range)  insulin aspart (novoLOG) injection 0-9 Units (has no administration in time range)  metoprolol tartrate (LOPRESSOR) tablet 50 mg (has no administration in time range)  losartan (COZAAR) tablet 50 mg (has no administration in time range)  furosemide (LASIX) injection 80 mg (has no administration in time range)     Initial Impression / Assessment and Plan / ED Course  I have reviewed the triage vital signs and the nursing notes.  Pertinent labs & imaging results that were available during my care of the patient were reviewed by me and considered in my medical decision making (see chart for details).     [name] Long is a 77 y.o. female with a past medical history significant for atrial fibrillation on Eliquis therapy, hypertension, MGUS, and hyper cholesterolemia who presents with worsening shortness of breath, bilateral lower extremity edema, and occasional chest pain.  Patient reports that she is from the [place],  and was doing well until February of this year.  She reports that she started having the leg edema and had an ultrasound showing she had a possible MI.  She says that she has been taking all her medicines since then and is on the Eliquis for her A. fib, metolazone, and Lasix.  She does not know of a history of congestive heart failure.  She says that over the last few days she has had worsening shortness of breath that is worsened with exertion.  She says that she cannot lie flat.  She reports that the swelling has worsened markedly over the last few weeks and she has not missed any of her medications.  She says the chest pain is gone for 2 months and is intermittent.  She reports it is burning and does not radiate.  She denies exertional chest pain, diaphoresis, nausea, or vomiting.  She denies fatigue.  She denies other complaints on arrival.  She denies trauma.  Next  On exam, patient has market pitting edema in both lower extremities going up past her knees.  Patient  had nontender chest nontender abdomen.  She has a systolic murmur.  Patient had diminished lung sounds but did not have overt crackles.  Patient had pulses in all extremities.  On EKG, patient was seen to have a bigeminy pattern that did appear to be related to atrial fibrillation.  Clinically I am concerned about Worsening heart failure, worsened kidney failure with a history of MGUS on the metolazone and Lasix, or fluid overload with pulmonary edema.  Patient's initial blood pressures were in the 180s through 440 systolic.  We will monitor this.  Anticipate reassessment after work-up.  Patient was reassessed after her work-up returned.  Urinalysis did not show infection, hepatic function showed slight increase in total bilirubin but otherwise was reassuring.  Lipase not elevated.  Magnesium was normal, initial troponin was negative, and metabolic panel showed slight increase in her creatinine up to 1.18.  Mild hypokalemia present.  Most  concerning was her BNP elevated at 391.  There is not a BNP to compare in our system.  Chest x-ray showed cardiomegaly without other findings however there was some hilar prominence concerning for adenopathy versus Vascular structures.  CT scan was recommended and was performed.  CT scan shows some pulmonary nodules but otherwise showed prominent vasculature.  Patient was then ambulated around the emergency department with pulse ox.  Although patient's pulse ox only went down into the 90% range  with ambulation, she became symptomatic.  She became very fatigued, short of breath, and was tachycardic into the 130s.  Patient reports that this is a new problem over the last few days.  Clinically I am concerned about fluid overload and heart failure exacerbation with possibly falsely low BNP.  She has had worsened leg swelling, the new shortness of breath, bigeminy, cannot lay flat, and is taking both her Lasix and her metolazone as directed.  I am concerned about her safety going home with the rising kidney function in the setting of the worsening edema and breathing.  I had a conversation with the patient and her family and they agree with our concern.   Unassigned medicine team will be called for admission for further evaluation of her heart failure, echo, and managing of the fluid overload with her multiple medications.   Final Clinical Impressions(s) / ED Diagnoses   Final diagnoses:  Shortness of breath  Peripheral edema    Clinical Impression: 1. Shortness of breath   2. Peripheral edema     Disposition: Admit  This note was prepared with assistance of Dragon voice recognition software. Occasional wrong-word or sound-a-like substitutions may have occurred due to the inherent limitations of voice recognition software.      [name] Long, Gwenyth Allegra, MD 08/28/17 [phone]

## 2017-08-28 NOTE — H&P (Signed)
History and Physical  [name] Long BTD:[id number] DOB: 25-Mar-1940 DOA: 08/28/2017  Referring physician: Dr Sherry Ruffing  PCP: Patient, No Pcp Per  Outpatient Specialists: Cardiology at the [facility] Patient coming from: Home  Chief Complaint: Chest pain and shortness of breath  HPI: [name] Long is a 77 y.o. female with medical history significant for self reported previous MI, unspecified CHF, chronic A. fib on Eliquis, HTN  who presented to the ED [facility] from home (where she is visiting her daughter. She resides in the [employer]) with complaints of chest pain and shortness of breath.  Patient reports onset since February, intermittently.  This morning symptoms became more severe.  Associated with bilateral lower extremity edema, orthopnea, of 3 days duration.  She has been compliant with her medications.  At the time of this visit, her symptoms had resolved.  ED Course: On presentation to the ED, vital signs remarkable for accelerated hypertension and A. fib with RVR.  Lab studies remarkable for elevated BNP, elevated creatinine, and hypokalemia.  Chest x-ray remarkable for cardiomegaly and increase in pulmonary vascularity. Admitted for acute CHF exacerbation.  Review of Systems: Review of systems as noted in HPI.  All other systems reviewed and are negative.   Past Medical History:  Diagnosis Date  . Atrial fibrillation ([place])   . Hypertension    Past Surgical History:  Procedure Laterality Date  . CESAREAN SECTION      Social History:  reports that she has never smoked. She has never used smokeless tobacco. She reports that she does not drink alcohol or use drugs.   No Known Allergies  No family history on file.  Brother with type 2 diabetes and hypertension.  Prior to Admission medications   Medication Sig Start Date End Date Taking? Authorizing Provider  apixaban (ELIQUIS) 5 MG TABS tablet Take 5 mg by mouth 2 (two) times daily.   Yes [provider]  brimonidine  (ALPHAGAN) 0.2 % ophthalmic solution Place 1 drop into both eyes 2 (two) times daily.   Yes [provider]  diltiazem (DILACOR XR) 240 MG 24 hr capsule Take 240 mg by mouth daily.   Yes [provider]  furosemide (LASIX) 40 MG tablet Take 40 mg by mouth daily as needed for edema or shortness of breath.  08/08/17  Yes [provider]  latanoprost (XALATAN) 0.005 % ophthalmic solution Place 1 drop into both eyes at bedtime.   Yes [provider]  losartan (COZAAR) 100 MG tablet Take 100 mg by mouth daily.   Yes [provider]  metolazone (ZAROXOLYN) 5 MG tablet Take 5 mg by mouth daily. FOR 7 DAYS ONLY 08/25/17 08/31/17 Yes [provider]    Physical Exam: BP (!) 180/80   Pulse 86   Temp 98 F (36.7 C) (Oral)   Resp 15   Ht 5\' 2"  (1.575 m)   Wt 100.7 kg (222 lb)   SpO2 100%   BMI 40.60 kg/m   . General: 77 y.o. year-old female well developed well nourished in no acute distress.  Alert and oriented x3. . Cardiovascular: Irregular rate and rhythm with no rubs or gallops.  No thyromegaly or JVD noted.   Marland Kitchen Respiratory: Mild rales diffusely bilaterally with no wheezes.  Good inspiratory effort. . Abdomen: Soft nontender nondistended with normal bowel sounds x4 quadrants. . Musculoskeletal: 2+ pitting edema, hyperpigmentation with suspicion for chronic venous stasis. . Skin: As noted above. Marland Kitchen Psychiatry: Mood is appropriate for condition and setting  Labs on Admission:  Basic Metabolic Panel: Recent Labs  Lab 08/28/17 0749 08/28/17 0807  NA 137  --   K 3.2*  --   CL 94*  --   CO2 32  --   GLUCOSE 145*  --   BUN 20  --   CREATININE 1.18*  --   CALCIUM 8.9  --   MG  --  2.0   Liver Function Tests: Recent Labs  Lab 08/28/17 0807  AST 29  ALT 24  ALKPHOS 68  BILITOT 1.6*  PROT 8.3*  ALBUMIN 3.5   Recent Labs  Lab 08/28/17 0807  LIPASE 33   No results for input(s): AMMONIA in the last 168  hours. CBC: Recent Labs  Lab 08/28/17 0749  WBC 3.5*  HGB 15.6*  HCT 48.9*  MCV 96.3  PLT 165   Cardiac Enzymes: No results for input(s): CKTOTAL, CKMB, CKMBINDEX, TROPONINI in the last 168 hours.  BNP (last 3 results) Recent Labs    08/28/17 0749  BNP 391.1*    ProBNP (last 3 results) No results for input(s): PROBNP in the last 8760 hours.  CBG: No results for input(s): GLUCAP in the last 168 hours.  Radiological Exams on Admission: Dg Chest 2 View  Result Date: 08/28/2017 CLINICAL DATA:  Pt reports central chest pain x2 days. Pt endorses some sob and leg swelling. Hx of afib per pt. Hx of HTN. Nonsmoker. EXAM: CHEST - 2 VIEW COMPARISON:  None. FINDINGS: Enlarged cardiac silhouette. RIGHT hilar fullness. No effusion, infiltrate or pneumothorax. No acute osseous abnormality. IMPRESSION: 1. Cardiomegaly without acute cardiopulmonary findings. 2. RIGHT hilar prominence may represent adenopathy versus prominent vascular structures. No comparison available. Recommend CT thorax without contrast for further evaluation Electronically Signed   By: Suzy Bouchard M.D.   On: 08/28/2017 08:51   Ct Chest Wo Contrast  Result Date: 08/28/2017 CLINICAL DATA:  Central chest pain for the past 2 days with shortness of breath. Prominent right hilum on chest x-ray. EXAM: CT CHEST WITHOUT CONTRAST TECHNIQUE: Multidetector CT imaging of the chest was performed following the standard protocol without IV contrast. COMPARISON:  Chest x-ray from same day. FINDINGS: Cardiovascular: Moderate cardiomegaly. No pericardial effusion. Normal caliber thoracic aorta. Coronary, aortic arch, and branch vessel atherosclerotic vascular disease. Mediastinum/Nodes: No enlarged mediastinal or axillary lymph nodes. Evaluation for hilar lymphadenopathy is limited without intravenous contrast. 1.3 cm hypodense nodule in the right thyroid lobe. The trachea and esophagus demonstrate no significant findings. Lungs/Pleura: No  focal consolidation, pleural effusion, or pneumothorax. There are a few small scattered pulmonary nodules in both lungs, the two largest of which measure 6 x 4 mm in the right upper lobe (series 4, image 66) and left lower lobe (series 4, image 93). Upper Abdomen: No acute abnormality. 2.5 cm right adrenal adenoma versus exophytic upper pole right renal cyst. Musculoskeletal: No chest wall mass or suspicious bone lesions identified. Mild diffuse subcutaneous chest wall edema. IMPRESSION: 1. Evaluation is limited without intravenous contrast, however right hilar fullness seen on chest x-ray most likely corresponds to prominent vascular structures. 2. Few scattered small pulmonary nodules in both lungs, the largest of which measure 5 mm in aggregate. No follow-up needed if patient is low-risk (and has no known or suspected primary neoplasm). Non-contrast chest CT can be considered in 12 months if patient is high-risk. This recommendation follows the consensus statement: Guidelines for Management of Incidental Pulmonary Nodules Detected on CT Images: From the Fleischner Society 2017; Radiology 2017; 284:228-243. 3. Moderate  cardiomegaly. 4. Aortic atherosclerosis (ICD10-I70.0). Electronically Signed   By: Titus Dubin M.D.   On: 08/28/2017 09:49    EKG: Independently reviewed.  History reviewed twelve-lead EKG which revealed A. fib with occasional PVCs 4.  Assessment/Plan Present on Admission: **None**  Active Problems:   Acute CHF (congestive heart failure) (HCC)  Acute CHF exacerbation Suspect contributed by A. fib RVR No prior records to specify the type of CHF Obtain 2D echo Cardiology consulted Continue Lasix and metolazone Strict I's and O's and daily weight  Chest pain rule out ACS First set of troponin negative Afib RVR may have contributed Self-reported prior history of MI 2D echo ordered Cycle troponin x3 Cardiology in consultation  A. fib RVR Resume Cardizem p.o. If  persistent consider starting Cardizem drip Monitor on telemetry Cardiology consulted Continue Eliquis  Hypertensive emergency With AKI Creatinine 1.18 with GFR 51 baseline creatinine 0.9 Treat hypertension Continue home medications Start labetalol 10 mg IV every 2 hours PRN for systolic blood pressure greater than 773 or diastolic greater than 736 or heart rate greater than 120 Continue telemetry monitoring  AKI Baseline creatinine 0.8 Creatinine 1.18 Avoid nephrotoxic agents/ Hold losartan Repeat BMP in the am  Hypokalemia Potassium 3.2 Repleted with p.o. potassium 40 mEq x 2 doses Repeat Bmp in the am  Morbid obesity BMI 40 Weight loss outpatient  Risks: Severe risk for the decompensation due to acute CHF exacerbation, A. fib RVR, multiple comorbidities and advanced age.   DVT prophylaxis: Eliquis  Code Status: Full code  Family Communication: Daughter at bedside  Disposition Plan: Admit to telemetry unit  Consults called: Cardiology  Admission status: Inpatient status  She will require at least 2 midnight for further testing and treatment of her present condition.    Kayleen Memos MD [facility] Pager [phone]  If 7PM-7AM, please contact night-coverage [email or web address] Password [facility]  08/28/2017, 12:47 PM

## 2017-08-28 NOTE — ED Triage Notes (Signed)
Pt reports central chest pain x2 days. Pt endorses some sob and leg swelling. Hx of afib per pt. Resp e/u, nad

## 2017-08-28 NOTE — Progress Notes (Signed)
Lab called critical troponin 0.04.  MD paged.  

## 2017-08-28 NOTE — Progress Notes (Signed)
  Echocardiogram 2D Echocardiogram has been performed.  [name] Long L Androw 08/28/2017, 2:44 PM

## 2017-08-29 LAB — GLUCOSE, CAPILLARY
GLUCOSE-CAPILLARY: 109 mg/dL — AB (ref 70–99)
GLUCOSE-CAPILLARY: 133 mg/dL — AB (ref 70–99)
GLUCOSE-CAPILLARY: 70 mg/dL (ref 70–99)
Glucose-Capillary: 106 mg/dL — ABNORMAL HIGH (ref 70–99)
Glucose-Capillary: 177 mg/dL — ABNORMAL HIGH (ref 70–99)
Glucose-Capillary: 100 mg/dL — ABNORMAL HIGH (ref 70–99)

## 2017-08-29 LAB — COMPREHENSIVE METABOLIC PANEL
ALBUMIN: 3.2 g/dL — AB (ref 3.5–5.0)
ALT: 26 U/L (ref 0–44)
AST: 31 U/L (ref 15–41)
Alkaline Phosphatase: 64 U/L (ref 38–126)
Anion gap: 11 (ref 5–15)
BILIRUBIN TOTAL: 1.5 mg/dL — AB (ref 0.3–1.2)
BUN: 20 mg/dL (ref 8–23)
CHLORIDE: 93 mmol/L — AB (ref 98–111)
CO2: 38 mmol/L — ABNORMAL HIGH (ref 22–32)
Calcium: 9 mg/dL (ref 8.9–10.3)
Creatinine, Ser: 1.28 mg/dL — ABNORMAL HIGH (ref 0.44–1.00)
GFR calc Af Amer: 46 mL/min — ABNORMAL LOW (ref >60)
GFR calc non Af Amer: 40 mL/min — ABNORMAL LOW (ref >60)
GLUCOSE: 138 mg/dL — AB (ref 70–99)
POTASSIUM: 3.4 mmol/L — AB (ref 3.5–5.1)
Sodium: 142 mmol/L (ref 135–145)
TOTAL PROTEIN: 7.7 g/dL (ref 6.5–8.1)

## 2017-08-29 LAB — CBC
HEMATOCRIT: 47.4 % — AB (ref 36.0–46.0)
Hemoglobin: 15.1 g/dL — ABNORMAL HIGH (ref 12.0–15.0)
MCH: 30.9 pg (ref 26.0–34.0)
MCHC: 31.9 g/dL (ref 30.0–36.0)
MCV: 96.9 fL (ref 78.0–100.0)
PLATELETS: 169 10*3/uL (ref 150–400)
RBC: 4.89 MIL/uL (ref 3.87–5.11)
RDW: 14.8 % (ref 11.5–15.5)
WBC: 4.5 10*3/uL (ref 4.0–10.5)

## 2017-08-29 LAB — URINE CULTURE

## 2017-08-29 MED ORDER — FUROSEMIDE 10 MG/ML IJ SOLN
80.0000 mg | Freq: Every day | INTRAMUSCULAR | Status: DC
  Administered 2017-08-29 – 2017-09-01 (×4): 80 mg via INTRAVENOUS
  Filled 2017-08-29 (×4): qty 8

## 2017-08-29 MED ORDER — INSULIN ASPART 100 UNIT/ML ~~LOC~~ SOLN: 0.0000 [IU] | Freq: Every day | SUBCUTANEOUS | Status: DC

## 2017-08-29 MED ORDER — ATORVASTATIN CALCIUM 20 MG PO TABS
20.0000 mg | ORAL_TABLET | Freq: Every day | ORAL | Status: DC
  Administered 2017-08-29 – 2017-09-01 (×4): 20 mg via ORAL
  Filled 2017-08-29 (×4): qty 1

## 2017-08-29 MED ORDER — INSULIN ASPART 100 UNIT/ML ~~LOC~~ SOLN
0.0000 [IU] | Freq: Three times a day (TID) | SUBCUTANEOUS | Status: DC
  Administered 2017-08-31 – 2017-09-01 (×3): 2 [IU] via SUBCUTANEOUS
  Administered 2017-09-02: 1 [IU] via SUBCUTANEOUS

## 2017-08-29 NOTE — Progress Notes (Signed)
Patient having some bleeding when she wipes. Will monitor.

## 2017-08-29 NOTE — Evaluation (Signed)
Occupational Therapy Evaluation Patient Details Name: [name] Long MRN: [id number] DOB: [birth year] Today's Date: 08/29/2017    History of Present Illness Pt is a 77 y/o female admitted secondary to suspected CHF. CT of chest showed pulmonary nodules. PMH includes HTN, a fib, and CKD.    Clinical Impression   This 77 y/o female presents with the above. At baseline is independent with ADLs, iADLs and functional mobility. Pt completing room level functional mobility without AD this session and overall minguard assist, mild unsteadiness noted though with no overt LOB. Demonstrates LB ADLs and standing grooming ADLs with minguard. Pt reports increased SOB with ambulating further distances and prolonged activity completion. Will continue to follow acutely to maximize her overall endurance, safety and independence with ADLs and functional mobility prior to discharge home.     Follow Up Recommendations  No OT follow up;Supervision - Intermittent    Equipment Recommendations  None recommended by OT           Precautions / Restrictions Precautions Precautions: None Restrictions Weight Bearing Restrictions: No      Mobility Bed Mobility               General bed mobility comments: In chair upon entry.   Transfers Overall transfer level: Needs assistance Equipment used: None Transfers: Sit to/from Stand Sit to Stand: Min guard         General transfer comment: minguard for safety; no physical assist required with use of armrests for sit<>stand     Balance Overall balance assessment: Needs assistance Sitting-balance support: No upper extremity supported;Feet supported Sitting balance-Leahy Scale: Good     Standing balance support: During functional activity;No upper extremity supported Standing balance-Leahy Scale: Fair                             ADL either performed or assessed with clinical judgement   ADL Overall ADL's : Needs  assistance/impaired Eating/Feeding: Modified independent;Sitting   Grooming: Min guard;Standing   Upper Body Bathing: Min guard;Sitting   Lower Body Bathing: Min guard;Sit to/from stand Lower Body Bathing Details (indicate cue type and reason): pt demonstrates ability to bed towards LEs while standing to "wash feet"  Upper Body Dressing : Set up;Sitting   Lower Body Dressing: Min guard;Sit to/from stand Lower Body Dressing Details (indicate cue type and reason): educated on threading LEs into clothing while seated for increased safety as pt reports increased difficulty lifting LEs during task completion  Toilet Transfer: Min guard;Ambulation;Regular Glass blower/designer Details (indicate cue type and reason): simulated in transfer to/from chair  Toileting- Water quality scientist and Hygiene: Min guard;Sit to/from stand       Functional mobility during ADLs: Min guard General ADL Comments: began education regarding energy conerstation techniques      Vision         Perception     Praxis      Pertinent Vitals/Pain Pain Assessment: No/denies pain     Hand Dominance Right   Extremity/Trunk Assessment Upper Extremity Assessment Upper Extremity Assessment: RUE deficits/detail RUE Deficits / Details: limited AROM at baseline; pt able to reach back of head (with increased effort) for functional ADL completion    Lower Extremity Assessment Lower Extremity Assessment: Defer to PT evaluation   Cervical / Trunk Assessment Cervical / Trunk Assessment: Normal   Communication Communication Communication: No difficulties   Cognition Arousal/Alertness: Awake/alert Behavior During Therapy: WFL for tasks assessed/performed Overall Cognitive Status: Within Functional Limits  for tasks assessed                                     General Comments       Exercises     Shoulder Instructions      Home Living Family/patient expects to be discharged to:: Private  residence Living Arrangements: Spouse/significant other Available Help at Discharge: Family;Available 24 hours/day Type of Home: House Home Access: Stairs to enter [employer] of Steps: 6 Entrance Stairs-Rails: Right Home Layout: One level     Bathroom Shower/Tub: Occupational psychologist: Handicapped height     Home Equipment: None          Prior Functioning/Environment Level of Independence: Independent                 OT Problem List: Decreased strength;Impaired balance (sitting and/or standing);Decreased activity tolerance;Cardiopulmonary status limiting activity      OT Treatment/Interventions: Self-care/ADL training;DME and/or AE instruction;Therapeutic activities;Balance training;Therapeutic exercise;Patient/family education    OT Goals(Current goals can be found in the care plan section) Acute Rehab OT Goals Patient Stated Goal: to go home  OT Goal Formulation: With patient Time For Goal Achievement: 09/12/17 Potential to Achieve Goals: Good  OT Frequency: Min 2X/week   Barriers to D/C:            Co-evaluation              AM-PAC PT "6 Clicks" Daily Activity     Outcome Measure Help from another person eating meals?: None Help from another person taking care of personal grooming?: None Help from another person toileting, which includes using toliet, bedpan, or urinal?: A Little Help from another person bathing (including washing, rinsing, drying)?: A Little Help from another person to put on and taking off regular upper body clothing?: None Help from another person to put on and taking off regular lower body clothing?: A Little 6 Click Score: 21   End of Session Equipment Utilized During Treatment: Gait belt Nurse Communication: Mobility status  Activity Tolerance: Patient tolerated treatment well Patient left: in chair;with call bell/phone within reach  OT Visit Diagnosis: Muscle weakness (generalized) (M62.81)                 Time: 2751-7001 OT Time Calculation (min): 17 min Charges:  OT General Charges $OT Visit: 1 Visit OT Evaluation $OT Eval Moderate Complexity: 1 Mod G-Codes:     [name], OT Pager [phone] 08/29/2017   Raymondo Band 08/29/2017, 5:26 PM

## 2017-08-29 NOTE — Care Management Note (Signed)
Case Management Note  Patient Details  Name: [name] Long MRN: [id number] Date of Birth: 1940-05-31  Subjective/Objective:    CHF               Action/Plan: CM talked to patient at the bedside; she is from [place] visiting her 2 daughters in [place]. She plans to return home in October 2019; patient states that she has a PCP but does not know the name; CM to follow up with her daughter for MD name for follow up apt; has medical insurance with Medicare / AARP with prescription drug coverage; pharmacy of choice is [facility] on [address]; no DME at this time; CM will continue to follow for progression of care.  Expected Discharge Date:    possibly 09/01/2017              Expected Discharge Plan:  Home/Self Care  Discharge planning Services  CM Consult  Status of Service:  In process, will continue to follow  Sherrilyn Rist [phone] 08/29/2017, 3:46 PM

## 2017-08-29 NOTE — Progress Notes (Signed)
PROGRESS NOTE  [name] Long  FYB:[id number] DOB: [birth year] DOA: 08/28/2017 PCP: Patient, No Pcp Per   Brief Narrative: [name] Long is a 77 y.o. female with a history of CHF, chronic AFib on eliquis, and HTN who is visiting from the [place], [place] and presented to the ED with chest pain and dyspnea found to have significant lower extremity edema which had been worsening for months. On arrival BP was severely elevated, she was in AFib with RVR. BNP elevated. CXR with increased pulmonary vascularity. Echocardiogram in the ED demonstrated an EF of 40%. Cardiology was consulted and IV diuresis started. Diltiazem was changed to metoprolol, ARB was added.   Assessment & Plan: Active Problems:   Acute CHF (congestive heart failure) ([facility])   Hypertensive emergency   Obesity, Class III, BMI 40-49.9 (morbid obesity) ([facility])   Hypokalemia   Acute on chronic combined systolic and diastolic CHF (congestive heart failure) ([facility])   Persistent atrial fibrillation ([facility])   Essential hypertension   Stage 3 chronic kidney disease ([facility])  Acute on chronic HFrEF: EF 40-45%. 218lbs on admission. Outpatient weights had trended upward from 205lbs >> 215lbs (most recent was 2017).  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  - LDL 78 > statin started.  AFib with RVR: Rate now better controlled with bradycardia without pauses. TSH 1.876. - Continue metoprolol - Continue eliquis  AKI: SCr 1.18 on admission with presumed baseline of 0.9.  - Monitor BMP daily with diuresis - Avoid nephrotoxins. If Cr continues to rise, may need to hold ACE/ARBs while aggressively diuresing  Hypertensive urgency:  - BP meds and prn labetalol  Hypokalemia: Improving.  - Continue BID supplementation with ongoing IV  diuresis  T2DM: HbA1c 7.1% - Change CBGs and SSI to AC/HS  MGUS:  - Continue observation per Dr. Geralyn Flash last night in 2017 she is due for follow up, especially if renal insufficiency continues.   DVT prophylaxis: Eliquis Code Status: Full Family Communication: None at bedside Disposition Plan: Home when closer to euvolemic.   Consultants:   Cardiology   Procedures:   None  Antimicrobials:  None   Subjective: HR down into 30's while sleeping last night. Noted no symptoms while awake, no orthostasis. Significant urination overnight. No chest pain. Dyspnea and swelling improved.   Objective: Vitals:   08/29/17 0011 08/29/17 0435 08/29/17 0839 08/29/17 1121  BP: 110/71 (!) 140/92 (!) 146/85 128/89  Pulse: (!) 55 (!) 37 61 60  Resp: 17 15  18   Temp: 98 F (36.7 C) 98.1 F (36.7 C)  97.7 F (36.5 C)  TempSrc: Oral Oral  Oral  SpO2: 98% 98% 97% 98%  Weight:  96.4 kg (212 lb 9.6 oz)    Height:        Intake/Output Summary (Last 24 hours) at 08/29/2017 1453 Last data filed at 08/29/2017 1316 Gross per 24 hour  Intake 1080 ml  Output 5525 ml  Net -4445 ml   Filed Weights   08/28/17 1157 08/28/17 1333 08/29/17 0435  Weight: 100.7 kg (222 lb) 99.2 kg (218 lb 9.6 oz) 96.4 kg (212 lb 9.6 oz)    Gen: 77 y.o. female in no distress  Pulm: Non-labored breathing room air. Clear to auscultation bilaterally.  CV: Regular rate and rhythm. No murmur, rub, or gallop. + JVD, 3+  pittingLE  edema to level of thighs. GI: Abdomen soft, non-tender, non-distended, with normoactive bowel sounds. No organomegaly or masses felt. Ext: Warm, no deformities Skin: No rashes, lesions or ulcers Neuro: Alert and oriented. No focal neurological deficits. Psych: Judgement and insight appear normal. Mood & affect appropriate.   Data Reviewed: I have personally reviewed following labs and imaging studies  CBC: Recent Labs  Lab 08/28/17 0749 08/29/17 0619  WBC 3.5* 4.5  HGB 15.6* 15.1*    HCT 48.9* 47.4*  MCV 96.3 96.9  PLT 165 454   Basic Metabolic Panel: Recent Labs  Lab 08/28/17 0749 08/28/17 0807 08/29/17 0619  NA 137  --  142  K 3.2*  --  3.4*  CL 94*  --  93*  CO2 32  --  38*  GLUCOSE 145*  --  138*  BUN 20  --  20  CREATININE 1.18*  --  1.28*  CALCIUM 8.9  --  9.0  MG  --  2.0  --    GFR: Estimated Creatinine Clearance: 40.5 mL/min (A) (by C-G formula based on SCr of 1.28 mg/dL (H)). Liver Function Tests: Recent Labs  Lab 08/28/17 0807 08/29/17 0619  AST 29 31  ALT 24 26  ALKPHOS 68 64  BILITOT 1.6* 1.5*  PROT 8.3* 7.7  ALBUMIN 3.5 3.2*   Recent Labs  Lab 08/28/17 0807  LIPASE 33   No results for input(s): AMMONIA in the last 168 hours. Coagulation Profile: No results for input(s): INR, PROTIME in the last 168 hours. Cardiac Enzymes: Recent Labs  Lab 08/28/17 1511 08/28/17 1850  TROPONINI 0.04* 0.04*   BNP (last 3 results) No results for input(s): PROBNP in the last 8760 hours. HbA1C: Recent Labs    08/28/17 1511  HGBA1C 7.1*   CBG: Recent Labs  Lab 08/28/17 2037 08/29/17 0007 08/29/17 0430 08/29/17 0717 08/29/17 1112  GLUCAP 159* 70 109* 106* 177*   Lipid Profile: Recent Labs    08/28/17 1511  CHOL 126  HDL 38*  LDLCALC 78  TRIG 51  CHOLHDL 3.3   Thyroid Function Tests: Recent Labs    08/28/17 1511  TSH 1.876   Anemia Panel: No results for input(s): VITAMINB12, FOLATE, FERRITIN, TIBC, IRON, RETICCTPCT in the last 72 hours. Urine analysis:    Component Value Date/Time   COLORURINE STRAW (A) 08/28/2017 0840   APPEARANCEUR CLEAR 08/28/2017 0840   LABSPEC 1.004 (L) 08/28/2017 0840   PHURINE 7.0 08/28/2017 0840   GLUCOSEU NEGATIVE 08/28/2017 0840   HGBUR LARGE (A) 08/28/2017 0840   BILIRUBINUR NEGATIVE 08/28/2017 0840   KETONESUR NEGATIVE 08/28/2017 0840   PROTEINUR NEGATIVE 08/28/2017 0840   NITRITE NEGATIVE 08/28/2017 0840   LEUKOCYTESUR NEGATIVE 08/28/2017 0840   Recent Results (from the past  240 hour(s))  Urine culture     Status: Abnormal   Collection Time: 08/28/17  8:40 AM  Result Value Ref Range Status   Specimen Description URINE, CLEAN CATCH  Final   Special Requests   Final    NONE Performed at [facility], [place] [address], [place], [place] [address]    Culture MULTIPLE SPECIES PRESENT, SUGGEST RECOLLECTION (A)  Final   Report Status 08/29/2017 FINAL  Final      Radiology Studies: Dg Chest 2 View  Result Date: 08/28/2017 CLINICAL DATA:  Pt reports central chest pain x2 days. Pt endorses some sob and leg swelling. Hx of afib per pt. Hx of HTN. Nonsmoker. EXAM: CHEST - 2 VIEW COMPARISON:  None. FINDINGS: Enlarged cardiac silhouette. RIGHT hilar fullness. No effusion, infiltrate or pneumothorax. No acute osseous abnormality. IMPRESSION: 1. Cardiomegaly without acute cardiopulmonary findings. 2. RIGHT hilar prominence may represent adenopathy versus prominent vascular structures. No comparison available. Recommend CT thorax without contrast for further evaluation Electronically Signed   By: Suzy Bouchard M.D.   On: 08/28/2017 08:51   Ct Chest Wo Contrast  Result Date: 08/28/2017 CLINICAL DATA:  Central chest pain for the past 2 days with shortness of breath. Prominent right hilum on chest x-ray. EXAM: CT CHEST WITHOUT CONTRAST TECHNIQUE: Multidetector CT imaging of the chest was performed following the standard protocol without IV contrast. COMPARISON:  Chest x-ray from same day. FINDINGS: Cardiovascular: Moderate cardiomegaly. No pericardial effusion. Normal caliber thoracic aorta. Coronary, aortic arch, and branch vessel atherosclerotic vascular disease. Mediastinum/Nodes: No enlarged mediastinal or axillary lymph nodes. Evaluation for hilar lymphadenopathy is limited without intravenous contrast. 1.3 cm hypodense nodule in the right thyroid lobe. The trachea and esophagus demonstrate no significant findings. Lungs/Pleura: No focal consolidation, pleural effusion, or  pneumothorax. There are a few small scattered pulmonary nodules in both lungs, the two largest of which measure 6 x 4 mm in the right upper lobe (series 4, image 66) and left lower lobe (series 4, image 93). Upper Abdomen: No acute abnormality. 2.5 cm right adrenal adenoma versus exophytic upper pole right renal cyst. Musculoskeletal: No chest wall mass or suspicious bone lesions identified. Mild diffuse subcutaneous chest wall edema. IMPRESSION: 1. Evaluation is limited without intravenous contrast, however right hilar fullness seen on chest x-ray most likely corresponds to prominent vascular structures. 2. Few scattered small pulmonary nodules in both lungs, the largest of which measure 5 mm in aggregate. No follow-up needed if patient is low-risk (and has no known or suspected primary neoplasm). Non-contrast chest CT can be considered in 12 months if patient is high-risk. This recommendation follows the consensus statement: Guidelines for Management of Incidental Pulmonary Nodules Detected on CT Images: From the Fleischner Society 2017; Radiology 2017; 284:228-243. 3. Moderate cardiomegaly. 4. Aortic atherosclerosis (ICD10-I70.0). Electronically Signed   By: Titus Dubin M.D.   On: 08/28/2017 09:49    Scheduled Meds: . apixaban  5 mg Oral BID  . atorvastatin  20 mg Oral q1800  . furosemide  80 mg Intravenous Daily  . insulin aspart  0-9 Units Subcutaneous Q4H  . losartan  50 mg Oral Daily  . metoprolol tartrate  50 mg Oral BID  . potassium chloride  40 mEq Oral BID   Continuous Infusions:   LOS: 1 day   Time spent: 25 minutes.  Patrecia Pour, MD [facility] [email or web address] Password [facility] 08/29/2017, 2:53 PM

## 2017-08-29 NOTE — Evaluation (Signed)
Physical Therapy Evaluation Patient Details Name: [name] Long MRN: [id number] DOB: [birth year] Today's Date: 08/29/2017   History of Present Illness  Pt is a 77 y/o female admitted secondary to suspected CHF. CT of chest showed pulmonary nodules. PMH includes HTN, a fib, and CKD.   Clinical Impression  Pt admitted secondary to problem above with deficits below. Pt limited by fatigue and required seated rest during ambulation. Required min to min guard A without use of AD. Educated about use of rollator at home to increase safety with mobility. Will continue to follow acutely to maximize functional mobility independence and safety.     Follow Up Recommendations Home health PT;Supervision for mobility/OOB    Equipment Recommendations  Other (comment)(rollator)    Recommendations for Other Services OT consult     Precautions / Restrictions Precautions Precautions: None Restrictions Weight Bearing Restrictions: No      Mobility  Bed Mobility               General bed mobility comments: In chair upon entry.   Transfers Overall transfer level: Needs assistance Equipment used: 1 person hand held assist Transfers: Sit to/from Stand Sit to Stand: Min assist;Min guard         General transfer comment: Pt heavily reliant on UEs to stand, so required min A to stand from chair without armrests. With armrests, pt requiring min guard for safety.   Ambulation/Gait Ambulation/Gait assistance: Min guard;Min assist Gait Distance (Feet): 50 Feet(X2) Assistive device: None Gait Pattern/deviations: Step-through pattern;Decreased stride length;Shuffle Gait velocity: Decreased    General Gait Details: Slow, waddle type gait. Cues for step height as pt would shuffle feet. Pt required seated rest in middle of gait training secondary to fatigue. Discussed use of rollator for ambulation to increase safety.   Stairs            Wheelchair Mobility    Modified Rankin (Stroke  Patients Only)       Balance Overall balance assessment: Needs assistance Sitting-balance support: No upper extremity supported;Feet supported Sitting balance-Leahy Scale: Good     Standing balance support: During functional activity;No upper extremity supported Standing balance-Leahy Scale: Fair                               Pertinent Vitals/Pain Pain Assessment: No/denies pain    Home Living Family/patient expects to be discharged to:: Private residence Living Arrangements: Spouse/significant other Available Help at Discharge: Family;Available 24 hours/day Type of Home: House Home Access: Stairs to enter Entrance Stairs-Rails: Right Entrance Stairs-Number of Steps: 6 Home Layout: One level Home Equipment: None      Prior Function Level of Independence: Independent               Hand Dominance   Dominant Hand: Right    Extremity/Trunk Assessment   Upper Extremity Assessment Upper Extremity Assessment: Overall WFL for tasks assessed    Lower Extremity Assessment Lower Extremity Assessment: Generalized weakness(bilat LE swelling )    Cervical / Trunk Assessment Cervical / Trunk Assessment: Normal  Communication   Communication: No difficulties  Cognition Arousal/Alertness: Awake/alert Behavior During Therapy: WFL for tasks assessed/performed Overall Cognitive Status: Within Functional Limits for tasks assessed                                        General Comments  Exercises     Assessment/Plan    PT Assessment Patient needs continued PT services  PT Problem List Cardiopulmonary status limiting activity;Decreased strength;Decreased activity tolerance;Decreased balance;Decreased knowledge of use of DME;Decreased mobility       PT Treatment Interventions DME instruction;Gait training;Stair training;Therapeutic activities;Functional mobility training;Therapeutic exercise;Balance training;Patient/family education     PT Goals (Current goals can be found in the Care Plan section)  Acute Rehab PT Goals Patient Stated Goal: to go home  PT Goal Formulation: With patient Time For Goal Achievement: 09/12/17 Potential to Achieve Goals: Good    Frequency Min 3X/week   Barriers to discharge        Co-evaluation               AM-PAC PT "6 Clicks" Daily Activity  Outcome Measure Difficulty turning over in bed (including adjusting bedclothes, sheets and blankets)?: A Little Difficulty moving from lying on back to sitting on the side of the bed? : A Little Difficulty sitting down on and standing up from a chair with arms (e.g., wheelchair, bedside commode, etc,.)?: Unable Help needed moving to and from a bed to chair (including a wheelchair)?: A Little Help needed walking in hospital room?: A Little Help needed climbing 3-5 steps with a railing? : A Lot 6 Click Score: 15    End of Session Equipment Utilized During Treatment: Gait belt Activity Tolerance: Patient tolerated treatment well Patient left: in chair;with call bell/phone within reach Nurse Communication: Mobility status PT Visit Diagnosis: Unsteadiness on feet (R26.81);Other abnormalities of gait and mobility (R26.89)    Time: 0940-7680 PT Time Calculation (min) (ACUTE ONLY): 12 min   Charges:   PT Evaluation $PT Eval Low Complexity: 1 Low     PT G Codes:        Leighton Ruff, PT, DPT  Acute Rehabilitation Services  Pager: [phone]   Rudean Hitt 08/29/2017, 1:38 PM

## 2017-08-29 NOTE — Progress Notes (Signed)
Progress Note  Patient Name: [name] Long Date of Encounter: 08/29/2017  Primary Cardiologist: Dr. Peter Martinique  Subjective   Patient was feeling better when seen this morning.  She denies any chest pain, shortness of breath or palpitations.  She diuresed very well and was happy with the decrease in her weight today.  Inpatient Medications    Scheduled Meds: . apixaban  5 mg Oral BID  . furosemide  80 mg Intravenous BID  . insulin aspart  0-9 Units Subcutaneous Q4H  . losartan  50 mg Oral Daily  . metoprolol tartrate  50 mg Oral BID  . potassium chloride  40 mEq Oral BID   Continuous Infusions:  PRN Meds: labetalol   Vital Signs    Vitals:   08/28/17 2039 08/28/17 2134 08/29/17 0011 08/29/17 0435  BP: 114/74 139/61 110/71 (!) 140/92  Pulse: 66 69 (!) 55 (!) 37  Resp:   17 15  Temp: 98.9 F (37.2 C)  98 F (36.7 C) 98.1 F (36.7 C)  TempSrc: Oral  Oral Oral  SpO2: 95%  98% 98%  Weight:    212 lb 9.6 oz (96.4 kg)  Height:        Intake/Output Summary (Last 24 hours) at 08/29/2017 0801 Last data filed at 08/29/2017 0749 Gross per 24 hour  Intake 480 ml  Output 4325 ml  Net -3845 ml    I/O since admission:   Filed Weights   08/28/17 1157 08/28/17 1333 08/29/17 0435  Weight: 222 lb (100.7 kg) 218 lb 9.6 oz (99.2 kg) 212 lb 9.6 oz (96.4 kg)    Telemetry     - Personally Reviewed.  Remained in A. fib with heart rate mostly in 60s and 70s, frequent PVCs, although improved as compared to yesterday.  ECG    ECG (independently read by me): A. fib with a heart rate of 66, Q waves in lead II, III and aVF.  Physical Exam    BP (!) 140/92 (BP Location: Left Arm)   Pulse (!) 37   Temp 98.1 F (36.7 C) (Oral)   Resp 15   Ht 5\' 2"  (1.575 m)   Wt 212 lb 9.6 oz (96.4 kg)   SpO2 98%   BMI 38.89 kg/m  General: Alert, oriented, no distress.  Skin: normal turgor, no rashes, warm and dry HEENT: Normocephalic, atraumatic.  Neck:  JVD, no carotid bruits; Lungs:  clear to ausculatation and percussion; no wheezing or rales Chest wall: without tenderness to palpitation Heart: Irregular rhythm with normal rate. Abdomen: soft, nontender; no hepatosplenomehaly, BS+;  Pulses 2+ Extremities: 3+ pitting edema bilaterally up to thighs. Neurologic: grossly nonfocal; Cranial nerves grossly wnl Psychologic: Normal mood and affect   Labs    Chemistry Recent Labs  Lab 08/28/17 0749 08/28/17 0807  NA 137  --   K 3.2*  --   CL 94*  --   CO2 32  --   GLUCOSE 145*  --   BUN 20  --   CREATININE 1.18*  --   CALCIUM 8.9  --   PROT  --  8.3*  ALBUMIN  --  3.5  AST  --  29  ALT  --  24  ALKPHOS  --  68  BILITOT  --  1.6*  GFRNONAA 44*  --   GFRAA 51*  --   ANIONGAP 11  --      Hematology Recent Labs  Lab 08/28/17 0749 08/29/17 0619  WBC 3.5* 4.5  RBC  5.08 4.89  HGB 15.6* 15.1*  HCT 48.9* 47.4*  MCV 96.3 96.9  MCH 30.7 30.9  MCHC 31.9 31.9  RDW 14.5 14.8  PLT 165 169    Cardiac Enzymes Recent Labs  Lab 08/28/17 1511 08/28/17 1850  TROPONINI 0.04* 0.04*    Recent Labs  Lab 08/28/17 0753  TROPIPOC 0.05     BNP Recent Labs  Lab 08/28/17 0749  BNP 391.1*     DDimer No results for input(s): DDIMER in the last 168 hours.   Lipid Panel     Component Value Date/Time   CHOL 126 08/28/2017 1511   CHOL 189 12/17/2015 0753   TRIG 51 08/28/2017 1511   TRIG 66 12/17/2015 0753   HDL 38 (L) 08/28/2017 1511   HDL 53 12/17/2015 0753   CHOLHDL 3.3 08/28/2017 1511   VLDL 10 08/28/2017 1511   LDLCALC 78 08/28/2017 1511   LDLCALC 123 (H) 12/17/2015 0753    Radiology    Dg Chest 2 View  Result Date: 08/28/2017 CLINICAL DATA:  Pt reports central chest pain x2 days. Pt endorses some sob and leg swelling. Hx of afib per pt. Hx of HTN. Nonsmoker. EXAM: CHEST - 2 VIEW COMPARISON:  None. FINDINGS: Enlarged cardiac silhouette. RIGHT hilar fullness. No effusion, infiltrate or pneumothorax. No acute osseous abnormality. IMPRESSION: 1.  Cardiomegaly without acute cardiopulmonary findings. 2. RIGHT hilar prominence may represent adenopathy versus prominent vascular structures. No comparison available. Recommend CT thorax without contrast for further evaluation Electronically Signed   By: Suzy Bouchard M.D.   On: 08/28/2017 08:51   Ct Chest Wo Contrast  Result Date: 08/28/2017 CLINICAL DATA:  Central chest pain for the past 2 days with shortness of breath. Prominent right hilum on chest x-ray. EXAM: CT CHEST WITHOUT CONTRAST TECHNIQUE: Multidetector CT imaging of the chest was performed following the standard protocol without IV contrast. COMPARISON:  Chest x-ray from same day. FINDINGS: Cardiovascular: Moderate cardiomegaly. No pericardial effusion. Normal caliber thoracic aorta. Coronary, aortic arch, and branch vessel atherosclerotic vascular disease. Mediastinum/Nodes: No enlarged mediastinal or axillary lymph nodes. Evaluation for hilar lymphadenopathy is limited without intravenous contrast. 1.3 cm hypodense nodule in the right thyroid lobe. The trachea and esophagus demonstrate no significant findings. Lungs/Pleura: No focal consolidation, pleural effusion, or pneumothorax. There are a few small scattered pulmonary nodules in both lungs, the two largest of which measure 6 x 4 mm in the right upper lobe (series 4, image 66) and left lower lobe (series 4, image 93). Upper Abdomen: No acute abnormality. 2.5 cm right adrenal adenoma versus exophytic upper pole right renal cyst. Musculoskeletal: No chest wall mass or suspicious bone lesions identified. Mild diffuse subcutaneous chest wall edema. IMPRESSION: 1. Evaluation is limited without intravenous contrast, however right hilar fullness seen on chest x-ray most likely corresponds to prominent vascular structures. 2. Few scattered small pulmonary nodules in both lungs, the largest of which measure 5 mm in aggregate. No follow-up needed if patient is low-risk (and has no known or suspected  primary neoplasm). Non-contrast chest CT can be considered in 12 months if patient is high-risk. This recommendation follows the consensus statement: Guidelines for Management of Incidental Pulmonary Nodules Detected on CT Images: From the Fleischner Society 2017; Radiology 2017; 284:228-243. 3. Moderate cardiomegaly. 4. Aortic atherosclerosis (ICD10-I70.0). Electronically Signed   By: Titus Dubin M.D.   On: 08/28/2017 09:49    Cardiac Studies   ECHO.08/28/17 Study Conclusions  - Left ventricle: The cavity size was normal. Wall thickness was  increased in a pattern of mild LVH. There was focal basal   hypertrophy. Systolic function was mildly to moderately reduced.   The estimated ejection fraction was in the range of 40% to 45%. - Mitral valve: There was moderate regurgitation. - Left atrium: The atrium was severely dilated. - Right ventricle: The cavity size was moderately dilated. Systolic   function was mildly to moderately reduced. - Right atrium: The atrium was moderately dilated. - Tricuspid valve: There was moderate-severe regurgitation. - Pulmonary arteries: Systolic pressure was moderately increased.   PA peak pressure: 42 mm Hg (S). - Pericardium, extracardiac: A trivial pericardial effusion was   identified.  Patient Profile     77 y.o. female with a past medical history significant for selfreported previous MI, unspecified CHF, chronic A. fib on Eliquis, HTNwho presented to the ED from home (where she is visiting her daughter. [place]with complaints of worsening lower extremity edema and shortness of breath for 2 months.  Assessment & Plan    HFrEF.  Echo with ejection fraction of 40 to 45%.  Volume overloaded. Diuresed very well with urinary output almost 4L with 1 dose of IV Lasix 80 mg. Creatinine increased to 1.28 today with GFR decreased to 46 from 51.  Weight decreased to 212 from 218 yesterday. TSH within normal limit. -Decrease  Lasix to 80 mg daily. -Continue beta-blocker. -Add Lipitor 20 mg daily. -Add KCl 40 mEq twice daily. -Daily BMPs.  A Fib.  Remained in A. fib with rate mostly in 60s, dropped to 37 around 4 AM while sleeping.  HTN.  Blood pressure improved, although still mildly elevated. -Continue losartan 50 mg daily. -Continue metoprolol 50 mg twice daily.  DM.  A1c 7.1. -Management per primary team.  Signed, Lorella Nimrod MD PGY2 08/29/2017, 8:01 AM

## 2017-08-29 NOTE — Plan of Care (Signed)
  Problem: Clinical Measurements: Goal: Ability to maintain clinical measurements within normal limits will improve Outcome: Progressing Goal: Cardiovascular complication will be avoided Outcome: Progressing   

## 2017-08-30 ENCOUNTER — Inpatient Hospital Stay (HOSPITAL_COMMUNITY): Payer: Medicare Other

## 2017-08-30 DIAGNOSIS — N179 Acute kidney failure, unspecified: Secondary | ICD-10-CM

## 2017-08-30 LAB — BASIC METABOLIC PANEL
Anion gap: 12 (ref 5–15)
BUN: 31 mg/dL — ABNORMAL HIGH (ref 8–23)
CO2: 34 mmol/L — ABNORMAL HIGH (ref 22–32)
Calcium: 8.8 mg/dL — ABNORMAL LOW (ref 8.9–10.3)
Chloride: 93 mmol/L — ABNORMAL LOW (ref 98–111)
Creatinine, Ser: 1.41 mg/dL — ABNORMAL HIGH (ref 0.44–1.00)
GFR calc Af Amer: 41 mL/min — ABNORMAL LOW (ref >60)
GFR calc non Af Amer: 35 mL/min — ABNORMAL LOW (ref >60)
Glucose, Bld: 145 mg/dL — ABNORMAL HIGH (ref 70–99)
Potassium: 5 mmol/L (ref 3.5–5.1)
Sodium: 139 mmol/L (ref 135–145)

## 2017-08-30 LAB — GLUCOSE, CAPILLARY
GLUCOSE-CAPILLARY: 137 mg/dL — AB (ref 70–99)
Glucose-Capillary: 80 mg/dL (ref 70–99)
Glucose-Capillary: 144 mg/dL — ABNORMAL HIGH (ref 70–99)
Glucose-Capillary: 182 mg/dL — ABNORMAL HIGH (ref 70–99)

## 2017-08-30 MED ORDER — BRIMONIDINE TARTRATE 0.2 % OP SOLN
1.0000 [drp] | Freq: Two times a day (BID) | OPHTHALMIC | Status: DC
  Administered 2017-08-30 – 2017-09-02 (×6): 1 [drp] via OPHTHALMIC
  Filled 2017-08-30: qty 5

## 2017-08-30 MED ORDER — LATANOPROST 0.005 % OP SOLN
1.0000 [drp] | Freq: Every day | OPHTHALMIC | Status: DC
  Administered 2017-08-30 – 2017-09-01 (×3): 1 [drp] via OPHTHALMIC
  Filled 2017-08-30: qty 2.5

## 2017-08-30 NOTE — Progress Notes (Signed)
PCP is Dr Brigitte Pulse at [facility]. Mindi [facility] [phone]

## 2017-08-30 NOTE — Plan of Care (Signed)
  Problem: Health Behavior/Discharge Planning: Goal: Ability to manage health-related needs will improve Outcome: Adequate for Discharge   Problem: Clinical Measurements: Goal: Ability to maintain clinical measurements within normal limits will improve Outcome: Adequate for Discharge Goal: Will remain free from infection Outcome: Adequate for Discharge Goal: Diagnostic test results will improve Outcome: Adequate for Discharge Goal: Respiratory complications will improve Outcome: Adequate for Discharge Goal: Cardiovascular complication will be avoided Outcome: Adequate for Discharge   Problem: Pain Managment: Goal: General experience of comfort will improve Outcome: Adequate for Discharge   Problem: Safety: Goal: Ability to remain free from injury will improve Outcome: Adequate for Discharge   Problem: Skin Integrity: Goal: Risk for impaired skin integrity will decrease Outcome: Adequate for Discharge

## 2017-08-30 NOTE — Progress Notes (Signed)
Occupational Therapy Treatment Patient Details Name: [name] Long MRN: [id number] DOB: [birth year] Today's Date: 08/30/2017    History of present illness Pt is a 77 y/o female admitted secondary to suspected CHF. CT of chest showed pulmonary nodules. PMH includes HTN, a fib, and CKD.    OT comments  Pt making excellent progress.  She is able to perform ADLs with supervision.  She does fatigue, but is very aware of deficits and paces self independently.   Follow Up Recommendations  No OT follow up;Supervision - Intermittent    Equipment Recommendations  None recommended by OT    Recommendations for Other Services      Precautions / Restrictions Precautions Precautions: None Restrictions Weight Bearing Restrictions: No       Mobility Bed Mobility Overal bed mobility: Modified Independent             General bed mobility comments: In chair upon entry.   Transfers Overall transfer level: Modified independent Equipment used: None   Sit to Stand: Supervision              Balance Overall balance assessment: Mild deficits observed, not formally tested Sitting-balance support: No upper extremity supported;Feet supported Sitting balance-Leahy Scale: Good     Standing balance support: During functional activity;No upper extremity supported Standing balance-Leahy Scale: Fair                             ADL either performed or assessed with clinical judgement   ADL Overall ADL's : Needs assistance/impaired     Grooming: Wash/dry hands;Wash/dry face;Oral care;Brushing hair;Supervision/safety;Standing   Upper Body Bathing: Supervision/ safety;Standing   Lower Body Bathing: Supervison/ safety;Sit to/from stand       Lower Body Dressing: Supervision/safety;Sit to/from stand   Toilet Transfer: Supervision/safety;Ambulation;Comfort height toilet   Toileting- Clothing Manipulation and Hygiene: Supervision/safety       Functional mobility during  ADLs: Supervision/safety General ADL Comments: Pt simulated shower in standing.  Pt requires 2 standing rest breaks, but is very self aware and is able pace self      Vision       Perception     Praxis      Cognition Arousal/Alertness: Awake/alert Behavior During Therapy: WFL for tasks assessed/performed Overall Cognitive Status: Within Functional Limits for tasks assessed                                          Exercises     Shoulder Instructions       General Comments      Pertinent Vitals/ Pain       Pain Assessment: No/denies pain  Home Living                                          Prior Functioning/Environment              Frequency  Min 2X/week        Progress Toward Goals  OT Goals(current goals can now be found in the care plan section)  Progress towards OT goals: Progressing toward goals  Acute Rehab OT Goals Patient Stated Goal: to go home   Plan Discharge plan remains appropriate    Co-evaluation  AM-PAC PT "6 Clicks" Daily Activity     Outcome Measure   Help from another person eating meals?: None Help from another person taking care of personal grooming?: None Help from another person toileting, which includes using toliet, bedpan, or urinal?: A Little Help from another person bathing (including washing, rinsing, drying)?: A Little Help from another person to put on and taking off regular upper body clothing?: None Help from another person to put on and taking off regular lower body clothing?: A Little 6 Click Score: 21    End of Session    OT Visit Diagnosis: Muscle weakness (generalized) (M62.81)   Activity Tolerance Patient tolerated treatment well   Patient Left in chair;with call bell/phone within reach   Nurse Communication Mobility status        Time: 6812-7517 OT Time Calculation (min): 10 min  Charges: OT General Charges $OT Visit: 1 Visit OT  Treatments $Self Care/Home Management : 8-22 mins  [facility], OTR/L [phone]    [name] M 08/30/2017, 1:52 PM

## 2017-08-30 NOTE — Progress Notes (Signed)
Progress Note  Patient Name: [name] Long Date of Encounter: 08/30/2017  Primary Cardiologist: Dr. Peter Martinique  Subjective   Patient was feeling better when seen this morning.  Her shortness of breath is improving and she is able to ambulate better.  Inpatient Medications    Scheduled Meds: . apixaban  5 mg Oral BID  . atorvastatin  20 mg Oral q1800  . furosemide  80 mg Intravenous Daily  . insulin aspart  0-5 Units Subcutaneous QHS  . insulin aspart  0-9 Units Subcutaneous TID WC  . metoprolol tartrate  50 mg Oral BID   Continuous Infusions:  PRN Meds: labetalol   Vital Signs    Vitals:   08/29/17 2126 08/29/17 2131 08/30/17 0025 08/30/17 0454  BP: (!) 134/104 (!) 143/89 136/90 (!) 127/91  Pulse: (!) 59 66 65 64  Resp: 18  18 18   Temp: 97.8 F (36.6 C)  97.9 F (36.6 C) 97.6 F (36.4 C)  TempSrc: Oral  Oral Oral  SpO2: 93%  95% 92%  Weight:    210 lb 4.8 oz (95.4 kg)  Height:        Intake/Output Summary (Last 24 hours) at 08/30/2017 0832 Last data filed at 08/30/2017 0825 Gross per 24 hour  Intake 1080 ml  Output 1800 ml  Net -720 ml    I/O since admission:   Filed Weights   08/28/17 1333 08/29/17 0435 08/30/17 0454  Weight: 218 lb 9.6 oz (99.2 kg) 212 lb 9.6 oz (96.4 kg) 210 lb 4.8 oz (95.4 kg)    Telemetry     - Personally Reviewed.  Remained in A. fib with rate in 60s to 70s.  ECG    ECG (independently read by me): No EKG today.  Physical Exam    BP (!) 127/91 (BP Location: Left Arm)   Pulse 64   Temp 97.6 F (36.4 C) (Oral)   Resp 18   Ht 5\' 2"  (1.575 m)   Wt 210 lb 4.8 oz (95.4 kg)   SpO2 92%   BMI 38.46 kg/m  General: Alert, oriented, no distress.  Skin: normal turgor, no rashes, warm and dry HEENT: Normocephalic, atraumatic.  Neck: JVD, no carotid bruits;  Lungs: clear to ausculatation and percussion; no wheezing or rales Chest wall: without tenderness to palpitation Heart: Irregular rhythm with normal rate. Abdomen:  soft, nontender; no hepatosplenomehaly, BS+;  Pulses 2+ Extremities: 3+ lower extremity edema up to above knees. Neurologic: grossly nonfocal; Cranial nerves grossly wnl Psychologic: Normal mood and affect   Labs    Chemistry Recent Labs  Lab 08/28/17 0749 08/28/17 0807 08/29/17 0619 08/30/17 0612  NA 137  --  142 139  K 3.2*  --  3.4* 5.0  CL 94*  --  93* 93*  CO2 32  --  38* 34*  GLUCOSE 145*  --  138* 145*  BUN 20  --  20 31*  CREATININE 1.18*  --  1.28* 1.41*  CALCIUM 8.9  --  9.0 8.8*  PROT  --  8.3* 7.7  --   ALBUMIN  --  3.5 3.2*  --   AST  --  29 31  --   ALT  --  24 26  --   ALKPHOS  --  68 64  --   BILITOT  --  1.6* 1.5*  --   GFRNONAA 44*  --  40* 35*  GFRAA 51*  --  46* 41*  ANIONGAP 11  --  11 12  Hematology Recent Labs  Lab 08/28/17 0749 08/29/17 0619  WBC 3.5* 4.5  RBC 5.08 4.89  HGB 15.6* 15.1*  HCT 48.9* 47.4*  MCV 96.3 96.9  MCH 30.7 30.9  MCHC 31.9 31.9  RDW 14.5 14.8  PLT 165 169    Cardiac Enzymes Recent Labs  Lab 08/28/17 1511 08/28/17 1850  TROPONINI 0.04* 0.04*    Recent Labs  Lab 08/28/17 0753  TROPIPOC 0.05     BNP Recent Labs  Lab 08/28/17 0749  BNP 391.1*     DDimer No results for input(s): DDIMER in the last 168 hours.   Lipid Panel     Component Value Date/Time   CHOL 126 08/28/2017 1511   CHOL 189 12/17/2015 0753   TRIG 51 08/28/2017 1511   TRIG 66 12/17/2015 0753   HDL 38 (L) 08/28/2017 1511   HDL 53 12/17/2015 0753   CHOLHDL 3.3 08/28/2017 1511   VLDL 10 08/28/2017 1511   LDLCALC 78 08/28/2017 1511   LDLCALC 123 (H) 12/17/2015 0753    Radiology    Ct Chest Wo Contrast  Result Date: 08/28/2017 CLINICAL DATA:  Central chest pain for the past 2 days with shortness of breath. Prominent right hilum on chest x-ray. EXAM: CT CHEST WITHOUT CONTRAST TECHNIQUE: Multidetector CT imaging of the chest was performed following the standard protocol without IV contrast. COMPARISON:  Chest x-ray from same  day. FINDINGS: Cardiovascular: Moderate cardiomegaly. No pericardial effusion. Normal caliber thoracic aorta. Coronary, aortic arch, and branch vessel atherosclerotic vascular disease. Mediastinum/Nodes: No enlarged mediastinal or axillary lymph nodes. Evaluation for hilar lymphadenopathy is limited without intravenous contrast. 1.3 cm hypodense nodule in the right thyroid lobe. The trachea and esophagus demonstrate no significant findings. Lungs/Pleura: No focal consolidation, pleural effusion, or pneumothorax. There are a few small scattered pulmonary nodules in both lungs, the two largest of which measure 6 x 4 mm in the right upper lobe (series 4, image 66) and left lower lobe (series 4, image 93). Upper Abdomen: No acute abnormality. 2.5 cm right adrenal adenoma versus exophytic upper pole right renal cyst. Musculoskeletal: No chest wall mass or suspicious bone lesions identified. Mild diffuse subcutaneous chest wall edema. IMPRESSION: 1. Evaluation is limited without intravenous contrast, however right hilar fullness seen on chest x-ray most likely corresponds to prominent vascular structures. 2. Few scattered small pulmonary nodules in both lungs, the largest of which measure 5 mm in aggregate. No follow-up needed if patient is low-risk (and has no known or suspected primary neoplasm). Non-contrast chest CT can be considered in 12 months if patient is high-risk. This recommendation follows the consensus statement: Guidelines for Management of Incidental Pulmonary Nodules Detected on CT Images: From the Fleischner Society 2017; Radiology 2017; 284:228-243. 3. Moderate cardiomegaly. 4. Aortic atherosclerosis (ICD10-I70.0). Electronically Signed   By: Titus Dubin M.D.   On: 08/28/2017 09:49    Cardiac Studies   ECHO.08/28/17 Study Conclusions  - Left ventricle: The cavity size was normal. Wall thickness was increased in a pattern of mild LVH. There was focal basal hypertrophy. Systolic function  was mildly to moderately reduced. The estimated ejection fraction was in the range of 40% to 45%. - Mitral valve: There was moderate regurgitation. - Left atrium: The atrium was severely dilated. - Right ventricle: The cavity size was moderately dilated. Systolic function was mildly to moderately reduced. - Right atrium: The atrium was moderately dilated. - Tricuspid valve: There was moderate-severe regurgitation. - Pulmonary arteries: Systolic pressure was moderately increased. PA peak pressure:  42 mm Hg (S). - Pericardium, extracardiac: A trivial pericardial effusion was identified.  Patient Profile     77 y.o. female with a past medical history significant for selfreported previous MI, unspecified CHF, chronic A. fib on Eliquis, HTNwho presented to the ED from home (where she is visiting her daughter. [place]with complaints ofworsening lower extremity edemaand shortness of breath for 2 months.  Assessment & Plan    HFrEF.  Echo with ejection fraction of 40 to 45%.  Volume overloaded. Diuresed very well with urinary output of 2L . Creatinine continue to rise to 1.41 today with GFR decreased to 41 from 46.  Weight decreased to 210 from 212 yesterday.  Total decrease of 8 pounds so far. Potassium within upper normal limit, we will stop supplement potassium at this time and will monitor BMP. TSH within normal limit. -Continue Lasix to 80 mg daily. -Continue beta-blocker. -Add Lipitor 20 mg daily. -Daily BMPs. -Renal ultrasound.  A Fib.  Remained in A. fib with rate mostly in 60s, dropped to 37 around 4 AM while sleeping.  HTN.  Blood pressure improved. -Continue losartan 50 mg daily. -Continue metoprolol 50 mg twice daily.  DM.A1c 7.1. -Management per primary team.   Signed, Lorella Nimrod MD PGY2 08/30/2017, 8:32 AM

## 2017-08-30 NOTE — Progress Notes (Signed)
Patients son brought home eye drops, explained that we can not put in refrigerator or use without a hospital label to scan for patient safety. NP paged for eye drop orders.

## 2017-08-30 NOTE — Progress Notes (Addendum)
PROGRESS NOTE  [name] Long  AJO:[id number] DOB: 18-Nov-1940 DOA: 08/28/2017 PCP: Patient, No Pcp Per   Brief Narrative: [name] Long is a 77 y.o. female with a history of CHF, chronic AFib on eliquis, and HTN who is visiting from the [place], [place] and presented to the ED with chest pain and dyspnea found to have significant lower extremity edema which had been worsening for months. On arrival BP was severely elevated, she was in AFib with RVR. BNP elevated. CXR with increased pulmonary vascularity. Echocardiogram in the ED demonstrated an EF of 40%. Cardiology was consulted and IV diuresis started. Diltiazem was changed to metoprolol, ARB was added.   Assessment & Plan: Active Problems:   Acute CHF (congestive heart failure) ([place])   Hypertensive emergency   Obesity, Class III, BMI 40-49.9 (morbid obesity) (HCC)   Hypokalemia   Acute on chronic combined systolic and diastolic CHF (congestive heart failure) (HCC)   Persistent atrial fibrillation (HCC)   Essential hypertension   Stage 3 chronic kidney disease (HCC)   AKI (acute kidney injury) ([place])  Acute on chronic HFrEF: EF 40-45%. 218lbs on admission. Outpatient weights had trended upward from 205lbs >> 215lbs (most recent was 2017).  - Continue lasix 80mg  daily, continues to have good output though creatinine rising. If continues to rise, would consider that LE edema is multifactorial including venous insufficiency. Doubt TED hose would fit on legs.  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  - LDL 78 > statin started.  Borderline hyperkalemia: Due to ARB, supplements - Hold K and losartan for now - Recheck in AM.   AFib with RVR: Rate now better controlled with bradycardia without pauses. TSH 1.876. - Continue metoprolol - Continue eliquis  AKI on stage III CKD: SCr 1.18 on admission. U/S with  12cm (L), 10.9cm (R) slightly increased echogenicity (c/w CKD) and no definite hydro, bilateral ureteral jets noted.  - Monitor BMP daily with diuresis - Avoid nephrotoxins. With continued rise, will stop ARB for now.  - Monitor BMP in AM  Hypertensive urgency: Improved - BP meds and prn labetalol  Hypokalemia: Improved - Hold replacement today and monitor.   T2DM: HbA1c 7.1% - Continue CBG, SSI to AC/HS  MGUS:  - Continue observation per Dr. Geralyn Flash last night in 2017 she is due for follow up, especially if renal insufficiency continues.   DVT prophylaxis: Eliquis Code Status: Full Family Communication: None at bedside Disposition Plan: Home when closer to euvolemic.   Consultants:   Cardiology   Procedures:   Echocardiogram 08/28/2017: - Left ventricle: The cavity size was normal. Wall thickness was   increased in a pattern of mild LVH. There was focal basal   hypertrophy. Systolic function was mildly to moderately reduced.   The estimated ejection fraction was in the range of 40% to 45%. - Mitral valve: There was moderate regurgitation. - Left atrium: The atrium was severely dilated. - Right ventricle: The cavity size was moderately dilated. Systolic   function was mildly to moderately reduced. - Right atrium: The atrium was moderately dilated. - Tricuspid valve: There was moderate-severe regurgitation. - Pulmonary arteries: Systolic pressure was moderately increased.   PA peak pressure: 42 mm Hg (S). - Pericardium, extracardiac: A trivial pericardial effusion was   identified.  Antimicrobials:  None   Subjective: Continue to urinate significantly, breathing more easily with exertion. No chest pain.  Objective: Vitals:   08/29/17 2131 08/30/17 0025 08/30/17 0454 08/30/17 1148  BP: (!) 143/89 136/90 (!) 127/91 131/85  Pulse: 66 65 64 (!) 51  Resp:  18 18 18   Temp:  97.9 F (36.6 C) 97.6 F (36.4 C) 97.7 F (36.5 C)  TempSrc:  Oral Oral Oral  SpO2:  95%  92% 96%  Weight:   95.4 kg (210 lb 4.8 oz)   Height:        Intake/Output Summary (Last 24 hours) at 08/30/2017 1316 Last data filed at 08/30/2017 0825 Gross per 24 hour  Intake 840 ml  Output 600 ml  Net 240 ml   Filed Weights   08/28/17 1333 08/29/17 0435 08/30/17 0454  Weight: 99.2 kg (218 lb 9.6 oz) 96.4 kg (212 lb 9.6 oz) 95.4 kg (210 lb 4.8 oz)    Gen: Pleasant female in no distress Pulm: Nonlabored breathing room air. Diminished at bases without crackles. CV: Irreg irreg. No murmur, rub, or gallop. + JVD, 3+ dependent edema. GI: Abdomen soft, non-tender, non-distended, with normoactive bowel sounds.  Ext: Warm, no deformities Skin: No rashes, lesions or ulcers on visualized skin.  Neuro: Alert and oriented. No focal neurological deficits. Psych: Judgement and insight appear normal. Mood euthymic & affect congruent. Behavior is appropriate.    Data Reviewed: I have personally reviewed following labs and imaging studies  CBC: Recent Labs  Lab 08/28/17 0749 08/29/17 0619  WBC 3.5* 4.5  HGB 15.6* 15.1*  HCT 48.9* 47.4*  MCV 96.3 96.9  PLT 165 275   Basic Metabolic Panel: Recent Labs  Lab 08/28/17 0749 08/28/17 0807 08/29/17 0619 08/30/17 0612  NA 137  --  142 139  K 3.2*  --  3.4* 5.0  CL 94*  --  93* 93*  CO2 32  --  38* 34*  GLUCOSE 145*  --  138* 145*  BUN 20  --  20 31*  CREATININE 1.18*  --  1.28* 1.41*  CALCIUM 8.9  --  9.0 8.8*  MG  --  2.0  --   --    GFR: Estimated Creatinine Clearance: 36.5 mL/min (A) (by C-G formula based on SCr of 1.41 mg/dL (H)). Liver Function Tests: Recent Labs  Lab 08/28/17 0807 08/29/17 0619  AST 29 31  ALT 24 26  ALKPHOS 68 64  BILITOT 1.6* 1.5*  PROT 8.3* 7.7  ALBUMIN 3.5 3.2*   Recent Labs  Lab 08/28/17 0807  LIPASE 33   No results for input(s): AMMONIA in the last 168 hours. Coagulation Profile: No results for input(s): INR, PROTIME in the last 168 hours. Cardiac Enzymes: Recent Labs  Lab  08/28/17 1511 08/28/17 1850  TROPONINI 0.04* 0.04*   BNP (last 3 results) No results for input(s): PROBNP in the last 8760 hours. HbA1C: Recent Labs    08/28/17 1511  HGBA1C 7.1*   CBG: Recent Labs  Lab 08/29/17 1112 08/29/17 1630 08/29/17 2124 08/30/17 0733 08/30/17 1145  GLUCAP 177* 133* 100* 80 144*   Lipid Profile: Recent Labs    08/28/17 1511  CHOL 126  HDL 38*  LDLCALC 78  TRIG 51  CHOLHDL 3.3   Thyroid Function Tests: Recent Labs    08/28/17 1511  TSH 1.876   Anemia Panel: No results for input(s): VITAMINB12, FOLATE, FERRITIN, TIBC, IRON, RETICCTPCT in the last 72 hours. Urine analysis:    Component Value Date/Time   COLORURINE STRAW (A) 08/28/2017 0840   APPEARANCEUR CLEAR 08/28/2017 0840   LABSPEC 1.004 (L)  08/28/2017 0840   PHURINE 7.0 08/28/2017 0840   GLUCOSEU NEGATIVE 08/28/2017 0840   HGBUR LARGE (A) 08/28/2017 0840   BILIRUBINUR NEGATIVE 08/28/2017 0840   KETONESUR NEGATIVE 08/28/2017 0840   PROTEINUR NEGATIVE 08/28/2017 0840   NITRITE NEGATIVE 08/28/2017 0840   LEUKOCYTESUR NEGATIVE 08/28/2017 0840   Recent Results (from the past 240 hour(s))  Urine culture     Status: Abnormal   Collection Time: 08/28/17  8:40 AM  Result Value Ref Range Status   Specimen Description URINE, CLEAN CATCH  Final   Special Requests   Final    NONE Performed at [facility], [place] [address], [place], [place] [address]    Culture MULTIPLE SPECIES PRESENT, SUGGEST RECOLLECTION (A)  Final   Report Status 08/29/2017 FINAL  Final      Radiology Studies: US Renal  Result Date: 08/30/2017 CLINICAL DATA:  Acute kidney injury. EXAM: RENAL / URINARY TRACT ULTRASOUND COMPLETE COMPARISON:  None. FINDINGS: Right Kidney: Length: 12.0 cm. No hydronephrosis. No focal renal parenchymal lesion. Echogenicity slightly increased. Left Kidney: Length: 10.9 cm. Mild fullness of the renal collecting system. No focal renal lesions seen. Echogenicity slightly increased.  Bladder: Bilateral ureteral jets evident. IMPRESSION: Kidneys show normal size but slightly increased echogenicity. Mild fullness of the renal collecting system on the left, significance doubtful, particularly given the presence of bilateral ureteral jets. Electronically Signed   By: Nelson Chimes M.D.   On: 08/30/2017 12:49    Scheduled Meds: . apixaban  5 mg Oral BID  . atorvastatin  20 mg Oral q1800  . furosemide  80 mg Intravenous Daily  . insulin aspart  0-5 Units Subcutaneous QHS  . insulin aspart  0-9 Units Subcutaneous TID WC  . metoprolol tartrate  50 mg Oral BID   Continuous Infusions:   LOS: 2 days   Time spent: 25 minutes.  Patrecia Pour, MD [facility] [email or web address] Password [email or web address] 08/30/2017, 1:16 PM

## 2017-08-30 NOTE — Progress Notes (Signed)
Physical Therapy Treatment Patient Details Name: [name] Long MRN: [id number] DOB: [birth year] Today's Date: 08/30/2017    History of Present Illness Pt is a 77 y/o female admitted secondary to suspected CHF. CT of chest showed pulmonary nodules. PMH includes HTN, a fib, and CKD.     PT Comments    Patient progressing with therapy this visit, reporting she feels stronger than yesterday. Pt still with balance deficits noted without AD, session focused on safe use of rollator and transferring on and off.  Pt supervision level for all OOB mobility, discussed to have husband with her pt agreeable. HHPT recs appropriate.    Follow Up Recommendations  Home health PT;Supervision for mobility/OOB     Equipment Recommendations  Other (comment)(rollator)    Recommendations for Other Services OT consult     Precautions / Restrictions Precautions Precautions: None Restrictions Weight Bearing Restrictions: No    Mobility  Bed Mobility Overal bed mobility: Modified Independent             General bed mobility comments: In chair upon entry.   Transfers Overall transfer level: Modified independent Equipment used: None   Sit to Stand: Supervision            Ambulation/Gait Ambulation/Gait assistance: Min Gaffer (Feet): 175 Feet Assistive device: None;4-wheeled walker Gait Pattern/deviations: Step-through pattern;Decreased stride length;Shuffle Gait velocity: dereased   General Gait Details: Patient ambulating with improved mechanics this visit without AD, still unsteady with small LOBs requring min guard for safety. Improved stability with RW, trialed standing and sitting on rollator    Stairs             Wheelchair Mobility    Modified Rankin (Stroke Patients Only)       Balance Overall balance assessment: Needs assistance Sitting-balance support: No upper extremity supported;Feet supported Sitting balance-Leahy Scale: Good      Standing balance support: During functional activity;No upper extremity supported Standing balance-Leahy Scale: Fair                              Cognition Arousal/Alertness: Awake/alert Behavior During Therapy: WFL for tasks assessed/performed Overall Cognitive Status: Within Functional Limits for tasks assessed                                        Exercises      General Comments        Pertinent Vitals/Pain Pain Assessment: No/denies pain    Home Living                      Prior Function            PT Goals (current goals can now be found in the care plan section) Acute Rehab PT Goals Patient Stated Goal: to go home  PT Goal Formulation: With patient Time For Goal Achievement: 09/12/17 Potential to Achieve Goals: Good Progress towards PT goals: Progressing toward goals    Frequency    Min 3X/week      PT Plan Current plan remains appropriate    Co-evaluation              AM-PAC PT "6 Clicks" Daily Activity  Outcome Measure  Difficulty turning over in bed (including adjusting bedclothes, sheets and blankets)?: A Little Difficulty moving from lying on back to sitting on the side of  the bed? : A Little Difficulty sitting down on and standing up from a chair with arms (e.g., wheelchair, bedside commode, etc,.)?: Unable Help needed moving to and from a bed to chair (including a wheelchair)?: A Little Help needed walking in hospital room?: A Little Help needed climbing 3-5 steps with a railing? : A Lot 6 Click Score: 15    End of Session Equipment Utilized During Treatment: Gait belt Activity Tolerance: Patient tolerated treatment well Patient left: in chair;with call bell/phone within reach Nurse Communication: Mobility status PT Visit Diagnosis: Unsteadiness on feet (R26.81);Other abnormalities of gait and mobility (R26.89)     Time: 8841-6606 PT Time Calculation (min) (ACUTE ONLY): 22 min  Charges:   $Gait Training: 8-22 mins                    G Codes:       Reinaldo Berber, PT, DPT Acute Rehab Services Pager: [phone]     Reinaldo Berber 08/30/2017, 10:19 AM

## 2017-08-31 LAB — BASIC METABOLIC PANEL
ANION GAP: 11 (ref 5–15)
BUN: 36 mg/dL — ABNORMAL HIGH (ref 8–23)
CALCIUM: 8.5 mg/dL — AB (ref 8.9–10.3)
CO2: 34 mmol/L — AB (ref 22–32)
CREATININE: 1.44 mg/dL — AB (ref 0.44–1.00)
Chloride: 92 mmol/L — ABNORMAL LOW (ref 98–111)
GFR, EST AFRICAN AMERICAN: 40 mL/min — AB (ref >60)
GFR, EST NON AFRICAN AMERICAN: 34 mL/min — AB (ref >60)
GLUCOSE: 135 mg/dL — AB (ref 70–99)
Potassium: 3.7 mmol/L (ref 3.5–5.1)
Sodium: 137 mmol/L (ref 135–145)

## 2017-08-31 LAB — GLUCOSE, CAPILLARY
GLUCOSE-CAPILLARY: 90 mg/dL (ref 70–99)
GLUCOSE-CAPILLARY: 111 mg/dL — AB (ref 70–99)
Glucose-Capillary: 117 mg/dL — ABNORMAL HIGH (ref 70–99)
Glucose-Capillary: 162 mg/dL — ABNORMAL HIGH (ref 70–99)

## 2017-08-31 LAB — HEPATIC FUNCTION PANEL
ALBUMIN: 3.1 g/dL — AB (ref 3.5–5.0)
ALT: 24 U/L (ref 0–44)
AST: 25 U/L (ref 15–41)
Alkaline Phosphatase: 66 U/L (ref 38–126)
BILIRUBIN INDIRECT: 0.8 mg/dL (ref 0.3–0.9)
Bilirubin, Direct: 0.3 mg/dL — ABNORMAL HIGH (ref 0.0–0.2)
Total Bilirubin: 1.1 mg/dL (ref 0.3–1.2)
Total Protein: 7.7 g/dL (ref 6.5–8.1)

## 2017-08-31 MED ORDER — POTASSIUM CHLORIDE CRYS ER 20 MEQ PO TBCR
20.0000 meq | EXTENDED_RELEASE_TABLET | Freq: Every day | ORAL | Status: DC
  Administered 2017-08-31: 20 meq via ORAL
  Filled 2017-08-31 (×2): qty 1

## 2017-08-31 MED ORDER — ISOSORB DINITRATE-HYDRALAZINE 20-37.5 MG PO TABS
1.0000 | ORAL_TABLET | Freq: Three times a day (TID) | ORAL | Status: DC
  Administered 2017-08-31 – 2017-09-02 (×7): 1 via ORAL
  Filled 2017-08-31 (×7): qty 1

## 2017-08-31 NOTE — Progress Notes (Signed)
Progress Note  Patient Name: [name] Long Date of Encounter: 08/31/2017  Primary Cardiologist: Dr. Peter Martinique  Subjective   She was feeling better when seen this morning.  Denies any shortness of breath.  She was able to ambulate and went up 1 flight of stairs without being short of breath.    Inpatient Medications    Scheduled Meds: . apixaban  5 mg Oral BID  . atorvastatin  20 mg Oral q1800  . brimonidine  1 drop Both Eyes BID  . furosemide  80 mg Intravenous Daily  . insulin aspart  0-5 Units Subcutaneous QHS  . insulin aspart  0-9 Units Subcutaneous TID WC  . latanoprost  1 drop Both Eyes QHS  . metoprolol tartrate  50 mg Oral BID   Continuous Infusions:  PRN Meds: labetalol   Vital Signs    Vitals:   08/30/17 2018 08/30/17 2216 08/31/17 0400 08/31/17 0800  BP: (!) 135/98 134/80 114/80 (!) 141/86  Pulse: (!) 58 64 68 66  Resp: 18  18 16   Temp: 97.6 F (36.4 C)  97.6 F (36.4 C) 97.6 F (36.4 C)  TempSrc: Oral  Oral Oral  SpO2: 93%  100% 100%  Weight:   208 lb 12.8 oz (94.7 kg)   Height:        Intake/Output Summary (Last 24 hours) at 08/31/2017 0838 Last data filed at 08/31/2017 0800 Gross per 24 hour  Intake 960 ml  Output 2550 ml  Net -1590 ml    I/O since admission:   Filed Weights   08/29/17 0435 08/30/17 0454 08/31/17 0400  Weight: 212 lb 9.6 oz (96.4 kg) 210 lb 4.8 oz (95.4 kg) 208 lb 12.8 oz (94.7 kg)    Telemetry     - Personally Reviewed.  A. fib with rate mostly in 60s  ECG    ECG (independently read by me): No EKG today.  Physical Exam   BP (!) 141/86 (BP Location: Left Arm)   Pulse 66   Temp 97.6 F (36.4 C) (Oral)   Resp 16   Ht 5\' 2"  (1.575 m)   Wt 208 lb 12.8 oz (94.7 kg)   SpO2 100%   BMI 38.19 kg/m  General: Alert, oriented, no distress.  Skin: normal turgor, no rashes, warm and dry HEENT: Normocephalic, atraumatic.  Neck: No JVD, no carotid bruits;  Lungs: clear to ausculatation and percussion; no wheezing or  rales Chest wall: without tenderness to palpitation Heart: PMI not displaced, RRR, s1 s2 normal, 1/6 systolic murmur, no diastolic murmur, no rubs, gallops, thrills, or heaves Abdomen: soft, nontender; no hepatosplenomehaly, BS+;  Extremities:  Neurologic: grossly nonfocal; Cranial nerves grossly wnl Psychologic: Normal mood and affect  Labs    Chemistry Recent Labs  Lab 08/28/17 0807 08/29/17 0619 08/30/17 0612 08/31/17 0657  NA  --  142 139 137  K  --  3.4* 5.0 3.7  CL  --  93* 93* 92*  CO2  --  38* 34* 34*  GLUCOSE  --  138* 145* 135*  BUN  --  20 31* 36*  CREATININE  --  1.28* 1.41* 1.44*  CALCIUM  --  9.0 8.8* 8.5*  PROT 8.3* 7.7  --  7.7  ALBUMIN 3.5 3.2*  --  3.1*  AST 29 31  --  25  ALT 24 26  --  24  ALKPHOS 68 64  --  66  BILITOT 1.6* 1.5*  --  1.1  GFRNONAA  --  40*  35* 34*  GFRAA  --  46* 41* 40*  ANIONGAP  --  11 12 11      Hematology Recent Labs  Lab 08/28/17 0749 08/29/17 0619  WBC 3.5* 4.5  RBC 5.08 4.89  HGB 15.6* 15.1*  HCT 48.9* 47.4*  MCV 96.3 96.9  MCH 30.7 30.9  MCHC 31.9 31.9  RDW 14.5 14.8  PLT 165 169    Cardiac Enzymes Recent Labs  Lab 08/28/17 1511 08/28/17 1850  TROPONINI 0.04* 0.04*    Recent Labs  Lab 08/28/17 0753  TROPIPOC 0.05     BNP Recent Labs  Lab 08/28/17 0749  BNP 391.1*     DDimer No results for input(s): DDIMER in the last 168 hours.   Lipid Panel     Component Value Date/Time   CHOL 126 08/28/2017 1511   CHOL 189 12/17/2015 0753   TRIG 51 08/28/2017 1511   TRIG 66 12/17/2015 0753   HDL 38 (L) 08/28/2017 1511   HDL 53 12/17/2015 0753   CHOLHDL 3.3 08/28/2017 1511   VLDL 10 08/28/2017 1511   LDLCALC 78 08/28/2017 1511   LDLCALC 123 (H) 12/17/2015 0753    Radiology    US Renal  Result Date: 08/30/2017 CLINICAL DATA:  Acute kidney injury. EXAM: RENAL / URINARY TRACT ULTRASOUND COMPLETE COMPARISON:  None. FINDINGS: Right Kidney: Length: 12.0 cm. No hydronephrosis. No focal renal  parenchymal lesion. Echogenicity slightly increased. Left Kidney: Length: 10.9 cm. Mild fullness of the renal collecting system. No focal renal lesions seen. Echogenicity slightly increased. Bladder: Bilateral ureteral jets evident. IMPRESSION: Kidneys show normal size but slightly increased echogenicity. Mild fullness of the renal collecting system on the left, significance doubtful, particularly given the presence of bilateral ureteral jets. Electronically Signed   By: Nelson Chimes M.D.   On: 08/30/2017 12:49    Cardiac Studies   ECHO.08/28/17 Study Conclusions  - Left ventricle: The cavity size was normal. Wall thickness was increased in a pattern of mild LVH. There was focal basal hypertrophy. Systolic function was mildly to moderately reduced. The estimated ejection fraction was in the range of 40% to 45%. - Mitral valve: There was moderate regurgitation. - Left atrium: The atrium was severely dilated. - Right ventricle: The cavity size was moderately dilated. Systolic function was mildly to moderately reduced. - Right atrium: The atrium was moderately dilated. - Tricuspid valve: There was moderate-severe regurgitation. - Pulmonary arteries: Systolic pressure was moderately increased. PA peak pressure: 42 mm Hg (S). - Pericardium, extracardiac: A trivial pericardial effusion was identified.  Patient Profile     77 y.o. female  with a past medical history significant for selfreported previous MI, unspecified CHF, chronic A. fib on Eliquis, HTNwho presented to the ED from home (where she is visiting her daughter. [place]with complaints ofworsening lower extremity edemaand shortness of breath for 2 months.  Assessment & Plan    HFrEF.Echo with ejection fraction of 40 to 45%.Volume overloaded. Diuresed very well with urinary output of 2L. Creatinine  blood 1.44 today..Weight decreased to 210 from 208 yesterday.  Total decrease of 10  pounds so far. TSH within normal limit. Potassium was 3.7 today, decreased from 5 yesterday. -Will add KCl 20 mEq daily. -Losartan was discontinued because of worsening renal function. -Add BiDil 1 tablet 3 times daily.. -Continue Lasix to 80 mg daily. -Continue beta-blocker. -Continue Lipitor20 mg daily. -Daily BMPs.  AKI.  Initially worsening creatinine now stable.  Unknown baseline. Renal ultrasound with no obstruction  and mild bilateral echogenicity. -Continue to monitor as patient needs diuresis. -If continue to get worse-will get benefit with nephrology consult.  A Fib.Remained in A. fib with rate mostly in 60s. -Continue Eliquis and metoprolol.  HTN.Blood pressure little elevated today. Losartan was discontinued yesterday because of worsening creatinine. -Add BiDil 20-37.5 mg 3 times a day. -Continue metoprolol50 mg twice daily.  DM.A1c7.1. -Management per primary team.   Signed, Lorella Nimrod MD PGY2 08/31/2017, 8:38 AM

## 2017-08-31 NOTE — Progress Notes (Signed)
Physical Therapy Treatment Patient Details Name: [name] Long MRN: [id number] DOB: March 07, 1940 Today's Date: 08/31/2017    History of Present Illness Pt is a 77 y/o female admitted secondary to suspected CHF. CT of chest showed pulmonary nodules. PMH includes HTN, a fib, and CKD.     PT Comments    Pt cont to do well with therapy and progress towards goals. Session focused extensively on stair training (see General Stair Comments below). Pt ambulating stairs with supervision level, no concerns for safe home entry at this point. HHPT recs still appropriate.    Follow Up Recommendations  Home health PT;Supervision for mobility/OOB     Equipment Recommendations  Other (comment)(rollator)    Recommendations for Other Services OT consult     Precautions / Restrictions Precautions Precautions: None Restrictions Weight Bearing Restrictions: No    Mobility  Bed Mobility                  Transfers Overall transfer level: Modified independent Equipment used: None                Ambulation/Gait Ambulation/Gait assistance: Min guard;Supervision   Assistive device: None;4-wheeled walker Gait Pattern/deviations: Step-through pattern;Decreased stride length;Shuffle         Stairs Stairs: Yes Stairs assistance: Min guard;Supervision Stair Management: One rail Right;Step to pattern;Sideways Number of Stairs: 24 General stair comments: several trials attempting various methods for stair entry. pt unsafe sideways as she does not clear her feet well enough into step despite cues for balance and enters tandem stance. pt safest foward with 1UE support on R rail, intermittent use of L UE support as well. cues for stronger R leg foot to lead up, and vice versa down. pt following sequencing well and able to perform stairs with supervision. pt reports comfort with stairs and states she will have family with her for safety   Wheelchair Mobility    Modified Rankin (Stroke  Patients Only)       Balance Overall balance assessment: Mild deficits observed, not formally tested                                          Cognition Arousal/Alertness: Awake/alert Behavior During Therapy: WFL for tasks assessed/performed Overall Cognitive Status: Within Functional Limits for tasks assessed                                        Exercises      General Comments        Pertinent Vitals/Pain Pain Assessment: No/denies pain    Home Living                      Prior Function            PT Goals (current goals can now be found in the care plan section) Acute Rehab PT Goals PT Goal Formulation: With patient Time For Goal Achievement: 09/12/17 Potential to Achieve Goals: Good Progress towards PT goals: Progressing toward goals    Frequency    Min 3X/week      PT Plan Current plan remains appropriate    Co-evaluation              AM-PAC PT "6 Clicks" Daily Activity  Outcome Measure  Difficulty turning over in bed (  including adjusting bedclothes, sheets and blankets)?: A Little Difficulty moving from lying on back to sitting on the side of the bed? : A Little Difficulty sitting down on and standing up from a chair with arms (e.g., wheelchair, bedside commode, etc,.)?: Unable Help needed moving to and from a bed to chair (including a wheelchair)?: A Little Help needed walking in hospital room?: A Little Help needed climbing 3-5 steps with a railing? : A Lot 6 Click Score: 15    End of Session Equipment Utilized During Treatment: Gait belt Activity Tolerance: Patient tolerated treatment well Patient left: in chair;with call bell/phone within reach Nurse Communication: Mobility status PT Visit Diagnosis: Unsteadiness on feet (R26.81);Other abnormalities of gait and mobility (R26.89)     Time: 8315-1761 PT Time Calculation (min) (ACUTE ONLY): 19 min  Charges:  $Gait Training: 8-22 mins                     G Codes:       Reinaldo Berber, PT, DPT Acute Rehab Services Pager: [phone]     Reinaldo Berber 08/31/2017, 9:53 AM

## 2017-08-31 NOTE — Progress Notes (Signed)
CM talked to patient about [place] services, patient stated that she would think about it; CM will continue to follow up for progression of care. Mindi Slicker [facility] [phone]

## 2017-08-31 NOTE — Progress Notes (Signed)
PROGRESS NOTE  [name] Long  NLG:[id number] DOB: July 03, 1940 DOA: 08/28/2017 PCP: Patient, No Pcp Per   Brief Narrative: [name] Long is a 77 y.o. female with a history of CHF, chronic AFib on eliquis, and HTN who is visiting from the [place], [place] and presented to the ED with chest pain and dyspnea found to have significant lower extremity edema which had been worsening for months. On arrival BP was severely elevated, she was in AFib with RVR. BNP elevated. CXR with increased pulmonary vascularity. Echocardiogram in the ED demonstrated an EF of 40%. Cardiology was consulted and IV diuresis started. Diltiazem was changed to metoprolol, ARB was added.   Assessment & Plan: Active Problems:   Acute CHF (congestive heart failure) ([place])   Hypertensive emergency   Obesity, Class III, BMI 40-49.9 (morbid obesity) (HCC)   Hypokalemia   Acute on chronic combined systolic and diastolic CHF (congestive heart failure) (HCC)   Persistent atrial fibrillation (HCC)   Essential hypertension   Stage 3 chronic kidney disease (HCC)   AKI (acute kidney injury) ([place])  Acute on chronic HFrEF: EF 40-45%. 218lbs on admission. Outpatient weights had trended upward from 205lbs >> 215lbs (most recent was 2017).  - Continue lasix 80mg  daily, continues to have good output, creatinine stable, BUN rising. Suspect LE edema is also due to venous insufficiency given body habitus. Elevate legs, likely couldn't get TED hose.  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  - LDL 78 > statin started.  Borderline hyperkalemia: Due to ARB, supplements. Improved with ongoing diuresis.  - Holding supplement for now.   AFib with RVR: Rate now better controlled with bradycardia without pauses. TSH 1.876. - Continue metoprolol - Continue eliquis  AKI on stage III CKD: SCr 1.18 on admission. U/S with  12cm (L), 10.9cm (R) slightly increased echogenicity (c/w CKD) and no definite hydro, bilateral ureteral jets noted.  - Monitor BMP daily with diuresis - Avoid nephrotoxins. With continued rise, stopped ARB for now.  - Monitor BMP daily  Hypertensive urgency: Resolved - BP meds and prn labetalol  Hypokalemia: Resolved.  - Continue to recheck.  T2DM: HbA1c 7.1% - Continue CBG, SSI to AC/HS  MGUS:  - Continue observation per Dr. Geralyn Flash last night in 2017 she is due for follow up, especially if renal insufficiency continues.   DVT prophylaxis: Eliquis Code Status: Full Family Communication: None at bedside Disposition Plan: Home when closer to euvolemic.   Consultants:   Cardiology   Procedures:   Echocardiogram 08/28/2017: - Left ventricle: The cavity size was normal. Wall thickness was   increased in a pattern of mild LVH. There was focal basal   hypertrophy. Systolic function was mildly to moderately reduced.   The estimated ejection fraction was in the range of 40% to 45%. - Mitral valve: There was moderate regurgitation. - Left atrium: The atrium was severely dilated. - Right ventricle: The cavity size was moderately dilated. Systolic   function was mildly to moderately reduced. - Right atrium: The atrium was moderately dilated. - Tricuspid valve: There was moderate-severe regurgitation. - Pulmonary arteries: Systolic pressure was moderately increased.   PA peak pressure: 42 mm Hg (S). - Pericardium, extracardiac: A trivial pericardial effusion was   identified.  Antimicrobials:  None   Subjective: Worked with PT, climbed stairs today with minimal dyspnea. No Chest pain. Continues to urinate briskly.  Objective: Vitals:   08/30/17 2018 08/30/17 2216 08/31/17 0400 08/31/17 0800  BP: (!) 135/98 134/80 114/80 (!) 141/86  Pulse: (!) 58 64 68 66  Resp: 18  18 16   Temp: 97.6 F (36.4 C)  97.6 F (36.4 C) 97.6 F (36.4 C)  TempSrc: Oral  Oral Oral  SpO2:  93%  100% 100%  Weight:   94.7 kg (208 lb 12.8 oz)   Height:        Intake/Output Summary (Last 24 hours) at 08/31/2017 1030 Last data filed at 08/31/2017 0800 Gross per 24 hour  Intake 960 ml  Output 2550 ml  Net -1590 ml   Filed Weights   08/29/17 0435 08/30/17 0454 08/31/17 0400  Weight: 96.4 kg (212 lb 9.6 oz) 95.4 kg (210 lb 4.8 oz) 94.7 kg (208 lb 12.8 oz)    Gen: Pleasant female in no distress Pulm: Nonlabored breathing room air. Clear. CV: Irreg irreg, rate in 70's, no murmur +JVD, 3+ pitting edema LE's GI: Abdomen soft, non-tender, non-distended, with normoactive bowel sounds.  Ext: Warm, no deformities Skin: No rashes, lesions or ulcers on visualized skin.  Neuro: Alert and oriented. No focal neurological deficits. Psych: Judgement and insight appear normal. Mood euthymic & affect congruent. Behavior is appropriate.   Data Reviewed: I have personally reviewed following labs and imaging studies  CBC: Recent Labs  Lab 08/28/17 0749 08/29/17 0619  WBC 3.5* 4.5  HGB 15.6* 15.1*  HCT 48.9* 47.4*  MCV 96.3 96.9  PLT 165 267   Basic Metabolic Panel: Recent Labs  Lab 08/28/17 0749 08/28/17 0807 08/29/17 0619 08/30/17 0612 08/31/17 0657  NA 137  --  142 139 137  K 3.2*  --  3.4* 5.0 3.7  CL 94*  --  93* 93* 92*  CO2 32  --  38* 34* 34*  GLUCOSE 145*  --  138* 145* 135*  BUN 20  --  20 31* 36*  CREATININE 1.18*  --  1.28* 1.41* 1.44*  CALCIUM 8.9  --  9.0 8.8* 8.5*  MG  --  2.0  --   --   --    GFR: Estimated Creatinine Clearance: 35.6 mL/min (A) (by C-G formula based on SCr of 1.44 mg/dL (H)). Liver Function Tests: Recent Labs  Lab 08/28/17 0807 08/29/17 0619 08/31/17 0657  AST 29 31 25   ALT 24 26 24   ALKPHOS 68 64 66  BILITOT 1.6* 1.5* 1.1  PROT 8.3* 7.7 7.7  ALBUMIN 3.5 3.2* 3.1*   Recent Labs  Lab 08/28/17 0807  LIPASE 33   No results for input(s): AMMONIA in the last 168 hours. Coagulation Profile: No results for input(s): INR, PROTIME  in the last 168 hours. Cardiac Enzymes: Recent Labs  Lab 08/28/17 1511 08/28/17 1850  TROPONINI 0.04* 0.04*   BNP (last 3 results) No results for input(s): PROBNP in the last 8760 hours. HbA1C: Recent Labs    08/28/17 1511  HGBA1C 7.1*   CBG: Recent Labs  Lab 08/30/17 0733 08/30/17 1145 08/30/17 1707 08/30/17 2125 08/31/17 0724  GLUCAP 80 144* 137* 182* 117*   Lipid Profile: Recent Labs    08/28/17 1511  CHOL 126  HDL 38*  LDLCALC 78  TRIG 51  CHOLHDL 3.3   Thyroid Function Tests: Recent Labs    08/28/17 1511  TSH 1.876   Anemia Panel: No results for input(s): VITAMINB12, FOLATE, FERRITIN, TIBC, IRON, RETICCTPCT in the last 72 hours. Urine analysis:    Component Value Date/Time  COLORURINE STRAW (A) 08/28/2017 0840   APPEARANCEUR CLEAR 08/28/2017 0840   LABSPEC 1.004 (L) 08/28/2017 0840   PHURINE 7.0 08/28/2017 0840   GLUCOSEU NEGATIVE 08/28/2017 0840   HGBUR LARGE (A) 08/28/2017 0840   BILIRUBINUR NEGATIVE 08/28/2017 0840   KETONESUR NEGATIVE 08/28/2017 0840   PROTEINUR NEGATIVE 08/28/2017 0840   NITRITE NEGATIVE 08/28/2017 0840   LEUKOCYTESUR NEGATIVE 08/28/2017 0840   Recent Results (from the past 240 hour(s))  Urine culture     Status: Abnormal   Collection Time: 08/28/17  8:40 AM  Result Value Ref Range Status   Specimen Description URINE, CLEAN CATCH  Final   Special Requests   Final    NONE Performed at [facility], [place] [address], [place], [place] [address]    Culture MULTIPLE SPECIES PRESENT, SUGGEST RECOLLECTION (A)  Final   Report Status 08/29/2017 FINAL  Final      Radiology Studies: US Renal  Result Date: 08/30/2017 CLINICAL DATA:  Acute kidney injury. EXAM: RENAL / URINARY TRACT ULTRASOUND COMPLETE COMPARISON:  None. FINDINGS: Right Kidney: Length: 12.0 cm. No hydronephrosis. No focal renal parenchymal lesion. Echogenicity slightly increased. Left Kidney: Length: 10.9 cm. Mild fullness of the renal collecting system. No  focal renal lesions seen. Echogenicity slightly increased. Bladder: Bilateral ureteral jets evident. IMPRESSION: Kidneys show normal size but slightly increased echogenicity. Mild fullness of the renal collecting system on the left, significance doubtful, particularly given the presence of bilateral ureteral jets. Electronically Signed   By: Nelson Chimes M.D.   On: 08/30/2017 12:49    Scheduled Meds: . apixaban  5 mg Oral BID  . atorvastatin  20 mg Oral q1800  . brimonidine  1 drop Both Eyes BID  . furosemide  80 mg Intravenous Daily  . insulin aspart  0-5 Units Subcutaneous QHS  . insulin aspart  0-9 Units Subcutaneous TID WC  . latanoprost  1 drop Both Eyes QHS  . metoprolol tartrate  50 mg Oral BID   Continuous Infusions:   LOS: 3 days   Time spent: 25 minutes.  Patrecia Pour, MD [facility] [email or web address] Password [email or web address] 08/31/2017, 10:30 AM

## 2017-08-31 NOTE — Care Management Important Message (Signed)
Important Message  Patient Details  Name: [name] Long MRN: [id number] Date of Birth: [birth year]   Medicare Important Message Given:  Yes    Hyun Marsalis P [place] 08/31/2017, 3:21 PM

## 2017-09-01 DIAGNOSIS — E876 Hypokalemia: Secondary | ICD-10-CM

## 2017-09-01 LAB — BASIC METABOLIC PANEL
Anion gap: 9 (ref 5–15)
BUN: 38 mg/dL — ABNORMAL HIGH (ref 8–23)
CALCIUM: 8 mg/dL — AB (ref 8.9–10.3)
CO2: 37 mmol/L — ABNORMAL HIGH (ref 22–32)
CREATININE: 1.56 mg/dL — AB (ref 0.44–1.00)
Chloride: 93 mmol/L — ABNORMAL LOW (ref 98–111)
GFR calc non Af Amer: 31 mL/min — ABNORMAL LOW (ref >60)
GFR, EST AFRICAN AMERICAN: 36 mL/min — AB (ref >60)
Glucose, Bld: 122 mg/dL — ABNORMAL HIGH (ref 70–99)
Potassium: 3.2 mmol/L — ABNORMAL LOW (ref 3.5–5.1)
Sodium: 139 mmol/L (ref 135–145)

## 2017-09-01 LAB — GLUCOSE, CAPILLARY
GLUCOSE-CAPILLARY: 87 mg/dL (ref 70–99)
GLUCOSE-CAPILLARY: 123 mg/dL — AB (ref 70–99)
Glucose-Capillary: 176 mg/dL — ABNORMAL HIGH (ref 70–99)
Glucose-Capillary: 167 mg/dL — ABNORMAL HIGH (ref 70–99)

## 2017-09-01 MED ORDER — FUROSEMIDE 80 MG PO TABS
80.0000 mg | ORAL_TABLET | Freq: Every day | ORAL | Status: DC
  Administered 2017-09-02: 80 mg via ORAL
  Filled 2017-09-01: qty 1

## 2017-09-01 MED ORDER — MUPIROCIN 2 % EX OINT
TOPICAL_OINTMENT | Freq: Two times a day (BID) | CUTANEOUS | Status: DC
  Administered 2017-09-01 – 2017-09-02 (×3): via TOPICAL
  Filled 2017-09-01: qty 22

## 2017-09-01 MED ORDER — POTASSIUM CHLORIDE CRYS ER 20 MEQ PO TBCR
40.0000 meq | EXTENDED_RELEASE_TABLET | Freq: Every day | ORAL | Status: DC
  Administered 2017-09-01 – 2017-09-02 (×2): 40 meq via ORAL
  Filled 2017-09-01 (×2): qty 2

## 2017-09-01 NOTE — Progress Notes (Signed)
PROGRESS NOTE  [name] Long  JQB:[id number] DOB: July 03, 1940 DOA: 08/28/2017 PCP: Patient, No Pcp Per   Brief Narrative: [name] Long is a 77 y.o. female with a history of CHF, chronic AFib on eliquis, and HTN who is visiting from the [place], [place] and presented to the ED with chest pain and dyspnea found to have significant lower extremity edema which had been worsening for months. On arrival BP was severely elevated, she was in AFib with RVR. BNP elevated. CXR with increased pulmonary vascularity. Echocardiogram in the ED demonstrated an EF of 40%. Cardiology was consulted and IV diuresis started. Diltiazem was changed to metoprolol, ARB was added.   Assessment & Plan: Active Problems:   Acute CHF (congestive heart failure) ([place])   Hypertensive emergency   Obesity, Class III, BMI 40-49.9 (morbid obesity) (HCC)   Hypokalemia   Acute on chronic combined systolic and diastolic CHF (congestive heart failure) (HCC)   Persistent atrial fibrillation (HCC)   Essential hypertension   Stage 3 chronic kidney disease (HCC)   AKI (acute kidney injury) ([place])  Acute on chronic HFrEF: EF 40-45%. 218lbs on admission. Outpatient weights had trended upward from 205lbs >> 215lbs (most recent was 2017).  - Agree with changing back to po diuretic and monitoring closely with increasing creatinine. Suspect LE edema is also due to venous insufficiency given body habitus. Elevate legs, likely couldn't get TED hose on at this time.  - Restart ARB as able - Stopped diltiazem with LV dysfunction and replaced with metoprolol - Will need ischemic evaluation per cardiology (has reported history of MI) though renal impairment limits yield vs. risk at this time.  - LDL 78 > statin started.  Borderline hyperkalemia: Due to ARB, supplements. Improved with ongoing diuresis.  - Holding supplement for now.   AFib with RVR: Rate now better controlled with bradycardia without pauses. TSH 1.876. - Continue  metoprolol - Continue eliquis, continue dose with age under 27 and weight above 60kg.   AKI on stage III CKD: SCr 1.18 on admission. U/S with 12cm (L), 10.9cm (R) slightly increased echogenicity (c/w CKD) and no definite hydro, bilateral ureteral jets noted.  - Monitor BMP daily with diuresis - Avoid nephrotoxins. With continued rise, stopped ARB and will deescalate diuresis.  Left toe blister: Unclear if this is purulent (paronychia) or serous. No tenderness or erythema argues against aggressive interventions.  - Will monitor, apply bactroban ointment, warm soaks.  - If develops features of infection (redness, swelling, tenderness) may need bedside I&D.   Hypertensive urgency: Resolved - BP meds and prn labetalol  Hypokalemia:  - Check daily and replace as needed. Worsened by diuresis.  T2DM: HbA1c 7.1% - Continue CBG, SSI to AC/HS  MGUS:  - Continue observation per Dr. Geralyn Flash last night in 2017 she is due for follow up, especially if renal insufficiency continues.   DVT prophylaxis: Eliquis Code Status: Full Family Communication: None at bedside Disposition Plan: Home when closer to euvolemic and renal function stable on po diuretic.  Consultants:   Cardiology   Procedures:   Echocardiogram 08/28/2017: - Left ventricle: The cavity size was normal. Wall thickness was   increased in a pattern of mild LVH. There was focal basal   hypertrophy. Systolic function was mildly to moderately reduced.   The estimated ejection fraction was in the range of 40% to 45%. - Mitral valve: There was moderate regurgitation. - Left atrium: The atrium was severely dilated. - Right ventricle: The cavity size was moderately dilated.  Systolic   function was mildly to moderately reduced. - Right atrium: The atrium was moderately dilated. - Tricuspid valve: There was moderate-severe regurgitation. - Pulmonary arteries: Systolic pressure was moderately increased.   PA peak pressure: 42 mm Hg  (S). - Pericardium, extracardiac: A trivial pericardial effusion was   identified.  Antimicrobials:  None   Subjective: Continues to have better breathing, exertional capacity. Noticed a spot on her toe which doesn't hurt. No trauma.    Objective: Vitals:   08/31/17 2130 09/01/17 0528 09/01/17 0917 09/01/17 1216  BP: (!) 148/98 129/87 132/80 107/65  Pulse: 75 71 74 (!) 49  Resp: 18 18  20   Temp: (!) 97.5 F (36.4 C) (!) 97.5 F (36.4 C)  (!) 97.5 F (36.4 C)  TempSrc: Oral Oral  Oral  SpO2: 100% 100%  99%  Weight:  93.9 kg (207 lb 1.6 oz)    Height:        Intake/Output Summary (Last 24 hours) at 09/01/2017 1432 Last data filed at 09/01/2017 1243 Gross per 24 hour  Intake 720 ml  Output 2550 ml  Net -1830 ml   Filed Weights   08/30/17 0454 08/31/17 0400 09/01/17 0528  Weight: 95.4 kg (210 lb 4.8 oz) 94.7 kg (208 lb 12.8 oz) 93.9 kg (207 lb 1.6 oz)    Gen: 77 y.o. female in no distress Pulm: Nonlabored breathing room air. Clear. CV: Irreg irreg, CVR. No murmur, rub, or gallop. No JVD, + dependent edema. GI: Abdomen soft, non-tender, non-distended, with normoactive bowel sounds.  Ext: Warm, no deformities Skin: Left great toe with small nontender blister bordering proximal nail fold.  Neuro: Alert and oriented. No focal neurological deficits. Psych: Judgement and insight appear fair. Mood euthymic & affect congruent. Behavior is appropriate.    Data Reviewed: I have personally reviewed following labs and imaging studies  CBC: Recent Labs  Lab 08/28/17 0749 08/29/17 0619  WBC 3.5* 4.5  HGB 15.6* 15.1*  HCT 48.9* 47.4*  MCV 96.3 96.9  PLT 165 875   Basic Metabolic Panel: Recent Labs  Lab 08/28/17 0749 08/28/17 0807 08/29/17 0619 08/30/17 0612 08/31/17 0657 09/01/17 0416  NA 137  --  142 139 137 139  K 3.2*  --  3.4* 5.0 3.7 3.2*  CL 94*  --  93* 93* 92* 93*  CO2 32  --  38* 34* 34* 37*  GLUCOSE 145*  --  138* 145* 135* 122*  BUN 20  --  20 31* 36*  38*  CREATININE 1.18*  --  1.28* 1.41* 1.44* 1.56*  CALCIUM 8.9  --  9.0 8.8* 8.5* 8.0*  MG  --  2.0  --   --   --   --    GFR: Estimated Creatinine Clearance: 32.7 mL/min (A) (by C-G formula based on SCr of 1.56 mg/dL (H)). Liver Function Tests: Recent Labs  Lab 08/28/17 0807 08/29/17 0619 08/31/17 0657  AST 29 31 25   ALT 24 26 24   ALKPHOS 68 64 66  BILITOT 1.6* 1.5* 1.1  PROT 8.3* 7.7 7.7  ALBUMIN 3.5 3.2* 3.1*   Recent Labs  Lab 08/28/17 0807  LIPASE 33   No results for input(s): AMMONIA in the last 168 hours. Coagulation Profile: No results for input(s): INR, PROTIME in the last 168 hours. Cardiac Enzymes: Recent Labs  Lab 08/28/17 1511 08/28/17 1850  TROPONINI 0.04* 0.04*   BNP (last 3 results) No results for input(s): PROBNP in the last 8760 hours. HbA1C: No results for  input(s): HGBA1C in the last 72 hours. CBG: Recent Labs  Lab 08/31/17 1212 08/31/17 1627 08/31/17 2128 09/01/17 0803 09/01/17 1213  GLUCAP 162* 90 111* 87 176*   Lipid Profile: No results for input(s): CHOL, HDL, LDLCALC, TRIG, CHOLHDL, LDLDIRECT in the last 72 hours. Thyroid Function Tests: No results for input(s): TSH, T4TOTAL, FREET4, T3FREE, THYROIDAB in the last 72 hours. Anemia Panel: No results for input(s): VITAMINB12, FOLATE, FERRITIN, TIBC, IRON, RETICCTPCT in the last 72 hours. Urine analysis:    Component Value Date/Time   COLORURINE STRAW (A) 08/28/2017 0840   APPEARANCEUR CLEAR 08/28/2017 0840   LABSPEC 1.004 (L) 08/28/2017 0840   PHURINE 7.0 08/28/2017 0840   GLUCOSEU NEGATIVE 08/28/2017 0840   HGBUR LARGE (A) 08/28/2017 0840   BILIRUBINUR NEGATIVE 08/28/2017 0840   KETONESUR NEGATIVE 08/28/2017 0840   PROTEINUR NEGATIVE 08/28/2017 0840   NITRITE NEGATIVE 08/28/2017 0840   LEUKOCYTESUR NEGATIVE 08/28/2017 0840   Recent Results (from the past 240 hour(s))  Urine culture     Status: Abnormal   Collection Time: 08/28/17  8:40 AM  Result Value Ref Range Status    Specimen Description URINE, CLEAN CATCH  Final   Special Requests   Final    NONE Performed at [facility], [place] [address], [place], [place] [address]    Culture MULTIPLE SPECIES PRESENT, SUGGEST RECOLLECTION (A)  Final   Report Status 08/29/2017 FINAL  Final      Radiology Studies: No results found.  Scheduled Meds: . apixaban  5 mg Oral BID  . atorvastatin  20 mg Oral q1800  . brimonidine  1 drop Both Eyes BID  . [START ON 09/02/2017] furosemide  80 mg Oral Daily  . insulin aspart  0-5 Units Subcutaneous QHS  . insulin aspart  0-9 Units Subcutaneous TID WC  . isosorbide-hydrALAZINE  1 tablet Oral TID  . latanoprost  1 drop Both Eyes QHS  . metoprolol tartrate  50 mg Oral BID  . potassium chloride  40 mEq Oral Daily   Continuous Infusions:   LOS: 4 days   Time spent: 25 minutes.  Patrecia Pour, MD [facility] [email or web address] Password [facility] 09/01/2017, 2:32 PM

## 2017-09-01 NOTE — Progress Notes (Signed)
DAILY PROGRESS NOTE   Patient Name: [name] Long Date of Encounter: 09/01/2017  Chief Complaint   Breathing has improved  Patient Profile   77 yo female from [place] with progresive DOE, edema and CHF symptoms, found to have an LVEF of 40-45% with moderate MR and severe LAE, there is mild to moderate RV dysfunction as well.  Subjective   Diuresed another 1.6L negative - hypokalemic today. Creatinine rose to 1.56 from 1.44.   Objective   Vitals:   08/31/17 2130 09/01/17 0528 09/01/17 0917 09/01/17 1216  BP: (!) 148/98 129/87 132/80 107/65  Pulse: 75 71 74 (!) 49  Resp: 18 18  20   Temp: (!) 97.5 F (36.4 C) (!) 97.5 F (36.4 C)  (!) 97.5 F (36.4 C)  TempSrc: Oral Oral  Oral  SpO2: 100% 100%  99%  Weight:  207 lb 1.6 oz (93.9 kg)    Height:        Intake/Output Summary (Last 24 hours) at 09/01/2017 1324 Last data filed at 09/01/2017 1243 Gross per 24 hour  Intake 1260 ml  Output 2800 ml  Net -1540 ml   Filed Weights   08/30/17 0454 08/31/17 0400 09/01/17 0528  Weight: 210 lb 4.8 oz (95.4 kg) 208 lb 12.8 oz (94.7 kg) 207 lb 1.6 oz (93.9 kg)    Physical Exam   General appearance: alert and no distress Lungs: clear to auscultation bilaterally Heart: irregularly irregular rhythm Extremities: edema 2+ bilateral pitting edema Neurologic: Grossly normal  Inpatient Medications    Scheduled Meds: . apixaban  5 mg Oral BID  . atorvastatin  20 mg Oral q1800  . brimonidine  1 drop Both Eyes BID  . furosemide  80 mg Intravenous Daily  . insulin aspart  0-5 Units Subcutaneous QHS  . insulin aspart  0-9 Units Subcutaneous TID WC  . isosorbide-hydrALAZINE  1 tablet Oral TID  . latanoprost  1 drop Both Eyes QHS  . metoprolol tartrate  50 mg Oral BID  . potassium chloride  40 mEq Oral Daily    Continuous Infusions:   PRN Meds: labetalol   Labs   Results for orders placed or performed during the hospital encounter of 08/28/17 (from the past 48 hour(s))    Glucose, capillary     Status: Abnormal   Collection Time: 08/30/17  5:07 PM  Result Value Ref Range   Glucose-Capillary 137 (H) 70 - 99 mg/dL  Glucose, capillary     Status: Abnormal   Collection Time: 08/30/17  9:25 PM  Result Value Ref Range   Glucose-Capillary 182 (H) 70 - 99 mg/dL   Comment 1 Notify RN    Comment 2 Document in Chart   Basic metabolic panel     Status: Abnormal   Collection Time: 08/31/17  6:57 AM  Result Value Ref Range   Sodium 137 135 - 145 mmol/L   Potassium 3.7 3.5 - 5.1 mmol/L   Chloride 92 (L) 98 - 111 mmol/L    Comment: Please note change in reference range.   CO2 34 (H) 22 - 32 mmol/L   Glucose, Bld 135 (H) 70 - 99 mg/dL    Comment: Please note change in reference range.   BUN 36 (H) 8 - 23 mg/dL    Comment: Please note change in reference range.   Creatinine, Ser 1.44 (H) 0.44 - 1.00 mg/dL   Calcium 8.5 (L) 8.9 - 10.3 mg/dL   GFR calc non Af Amer 34 (L) >60 mL/min  GFR calc Af Amer 40 (L) >60 mL/min    Comment: (NOTE) The eGFR has been calculated using the CKD EPI equation. This calculation has not been validated in all clinical situations. eGFR's persistently <60 mL/min signify possible Chronic Kidney Disease.    Anion gap 11 5 - 15    Comment: Performed at [facility] [address], [place], [place] [address]  Hepatic function panel     Status: Abnormal   Collection Time: 08/31/17  6:57 AM  Result Value Ref Range   Total Protein 7.7 6.5 - 8.1 g/dL   Albumin 3.1 (L) 3.5 - 5.0 g/dL   AST 25 15 - 41 U/L   ALT 24 0 - 44 U/L    Comment: Please note change in reference range.   Alkaline Phosphatase 66 38 - 126 U/L   Total Bilirubin 1.1 0.3 - 1.2 mg/dL   Bilirubin, Direct 0.3 (H) 0.0 - 0.2 mg/dL    Comment: Please note change in reference range.   Indirect Bilirubin 0.8 0.3 - 0.9 mg/dL    Comment: Performed at [facility], [place] [address], [place], [place] [address]  Glucose, capillary     Status: Abnormal   Collection Time:  08/31/17  7:24 AM  Result Value Ref Range   Glucose-Capillary 117 (H) 70 - 99 mg/dL   Comment 1 Notify RN    Comment 2 Document in Chart   Glucose, capillary     Status: Abnormal   Collection Time: 08/31/17 12:12 PM  Result Value Ref Range   Glucose-Capillary 162 (H) 70 - 99 mg/dL   Comment 1 Notify RN    Comment 2 Document in Chart   Glucose, capillary     Status: None   Collection Time: 08/31/17  4:27 PM  Result Value Ref Range   Glucose-Capillary 90 70 - 99 mg/dL   Comment 1 Notify RN    Comment 2 Document in Chart   Glucose, capillary     Status: Abnormal   Collection Time: 08/31/17  9:28 PM  Result Value Ref Range   Glucose-Capillary 111 (H) 70 - 99 mg/dL   Comment 1 Notify RN    Comment 2 Document in Chart   Basic metabolic panel     Status: Abnormal   Collection Time: 09/01/17  4:16 AM  Result Value Ref Range   Sodium 139 135 - 145 mmol/L   Potassium 3.2 (L) 3.5 - 5.1 mmol/L   Chloride 93 (L) 98 - 111 mmol/L    Comment: Please note change in reference range.   CO2 37 (H) 22 - 32 mmol/L   Glucose, Bld 122 (H) 70 - 99 mg/dL    Comment: Please note change in reference range.   BUN 38 (H) 8 - 23 mg/dL    Comment: Please note change in reference range.   Creatinine, Ser 1.56 (H) 0.44 - 1.00 mg/dL   Calcium 8.0 (L) 8.9 - 10.3 mg/dL   GFR calc non Af Amer 31 (L) >60 mL/min   GFR calc Af Amer 36 (L) >60 mL/min    Comment: (NOTE) The eGFR has been calculated using the CKD EPI equation. This calculation has not been validated in all clinical situations. eGFR's persistently <60 mL/min signify possible Chronic Kidney Disease.    Anion gap 9 5 - 15    Comment: Performed at [facility] [address], [place], [place] [address]  Glucose, capillary     Status: None  Collection Time: 09/01/17  8:03 AM  Result Value Ref Range   Glucose-Capillary 87 70 - 99 mg/dL   Comment 1 Notify RN    Comment 2 Document in Chart   Glucose, capillary     Status: Abnormal    Collection Time: 09/01/17 12:13 PM  Result Value Ref Range   Glucose-Capillary 176 (H) 70 - 99 mg/dL   Comment 1 Notify RN    Comment 2 Document in Chart     ECG   N/A  Telemetry   Afib with CVR - Personally Reviewed  Radiology    No results found.  Cardiac Studies   N/A  Assessment   1. Active Problems: 2.   Acute CHF (congestive heart failure) ([place]) 3.   Hypertensive emergency 4.   Obesity, Class III, BMI 40-49.9 (morbid obesity) ([place]) 5.   Hypokalemia 6.   Acute on chronic combined systolic and diastolic CHF (congestive heart failure) ([facility]) 7.   Persistent atrial fibrillation ([place]) 8.   Essential hypertension 9.   Stage 3 chronic kidney disease ([place]) 10.   AKI (acute kidney injury) ([place]) 11.   Plan   1. Good urine output, however, creatinine continues to rise. Given IV lasix this am - will switch to po lasix tomorrow at a lower dose. Monitor creatinine. Potassium replaced today. Renal function will not support cath at this time, no will it support ACE-I/ARB/ARNI.  Time Spent Directly with Patient:  I have spent a total of 25 minutes with the patient reviewing hospital notes, telemetry, EKGs, labs and examining the patient as well as establishing an assessment and plan that was discussed personally with the patient.  > 50% of time was spent in direct patient care.  Length of Stay:  LOS: 4 days   Pixie Casino, MD, [facility] [place] Director of the Advanced Lipid Disorders &  Cardiovascular Risk Reduction Clinic Diplomate of the American Board of Clinical Lipidology Attending Cardiologist  Direct Dial: [phone]  Fax: [phone]  Website:  [email or web address]  Nadean Corwin Fardeen Steinberger 09/01/2017, 1:24 PM

## 2017-09-01 NOTE — Plan of Care (Signed)
  Problem: Clinical Measurements: Goal: Cardiovascular complication will be avoided Outcome: Progressing   

## 2017-09-02 DIAGNOSIS — I161 Hypertensive emergency: Secondary | ICD-10-CM

## 2017-09-02 DIAGNOSIS — I5021 Acute systolic (congestive) heart failure: Secondary | ICD-10-CM

## 2017-09-02 LAB — BASIC METABOLIC PANEL
ANION GAP: 9 (ref 5–15)
BUN: 42 mg/dL — ABNORMAL HIGH (ref 8–23)
CALCIUM: 8.6 mg/dL — AB (ref 8.9–10.3)
CO2: 38 mmol/L — AB (ref 22–32)
Chloride: 91 mmol/L — ABNORMAL LOW (ref 98–111)
Creatinine, Ser: 1.51 mg/dL — ABNORMAL HIGH (ref 0.44–1.00)
GFR calc Af Amer: 38 mL/min — ABNORMAL LOW (ref >60)
GFR calc non Af Amer: 32 mL/min — ABNORMAL LOW (ref >60)
Glucose, Bld: 125 mg/dL — ABNORMAL HIGH (ref 70–99)
Potassium: 3.3 mmol/L — ABNORMAL LOW (ref 3.5–5.1)
SODIUM: 138 mmol/L (ref 135–145)

## 2017-09-02 LAB — GLUCOSE, CAPILLARY
GLUCOSE-CAPILLARY: 97 mg/dL (ref 70–99)
Glucose-Capillary: 125 mg/dL — ABNORMAL HIGH (ref 70–99)

## 2017-09-02 MED ORDER — POTASSIUM CHLORIDE CRYS ER 20 MEQ PO TBCR: 40.0000 meq | EXTENDED_RELEASE_TABLET | Freq: Every day | ORAL | 0 refills | Status: DC

## 2017-09-02 MED ORDER — ATORVASTATIN CALCIUM 20 MG PO TABS: 20.0000 mg | ORAL_TABLET | Freq: Every day | ORAL | 0 refills | Status: DC

## 2017-09-02 MED ORDER — METOPROLOL TARTRATE 50 MG PO TABS: 50.0000 mg | ORAL_TABLET | Freq: Two times a day (BID) | ORAL | 0 refills | Status: DC

## 2017-09-02 MED ORDER — ISOSORB DINITRATE-HYDRALAZINE 20-37.5 MG PO TABS: 1.0000 | ORAL_TABLET | Freq: Three times a day (TID) | ORAL | 0 refills | Status: DC

## 2017-09-02 MED ORDER — FUROSEMIDE 80 MG PO TABS: 80.0000 mg | ORAL_TABLET | Freq: Every day | ORAL | 0 refills | Status: DC

## 2017-09-02 NOTE — Discharge Summary (Signed)
Physician Discharge Summary  [name] Long:[id number] DOB: 04-Feb-1941 DOA: 08/28/2017  PCP: Patient, No Pcp Per  Admit date: 08/28/2017 Discharge date: 09/02/2017  Admitted From: Home Disposition: Home   Recommendations for Outpatient Follow-up:  1. Follow up with PCP, Dr. [name] Class, in 1-2 weeks 2. Follow up with cardiology, Roque Cash, PA-C, in the next 1 week with repeat BMP. Consider restarting ARB at that time. 3. 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 risk at this time.  4. Plans to return to the [place] at the end of October.   Home Health: None Equipment/Devices: None Discharge Condition: Stable CODE STATUS: Full Diet recommendation: Heart healthy  Brief/Interim Summary: [name] Long is a 77 y.o. female with a history of CHF, chronic AFib on eliquis, and HTN who is visiting from the [place], [place] and presented to the ED with chest pain and dyspnea found to have significant lower extremity edema which had been worsening for months. On arrival BP was severely elevated, she was in AFib with RVR. BNP elevated. CXR with increased pulmonary vascularity. Echocardiogram in the ED demonstrated an EF of 40%. Cardiology was consulted and IV diuresis started. Diltiazem was changed to metoprolol, ARB was added and lasix was converted to oral form with stable renal function and improved volume status.   Discharge Diagnoses:  Active Problems:   Acute CHF (congestive heart failure) ([place])   Hypertensive emergency   Obesity, Class III, BMI 40-49.9 (morbid obesity) ([facility])   Hypokalemia   Acute on chronic combined systolic and diastolic CHF (congestive heart failure) (H[facility])   Persistent atrial fibrillation (H[facility])   Essential hypertension   Stage 3 chronic kidney disease (H[facility])   AKI (acute kidney injury) ([place])  Acute on chronic HFrEF: EF 40-45%. 218lbs on admission. Discharge weight is 205lbs.  -  Continue lasix 80mg  (up from 40mg  PTA) and continue supplement of K with that pending follow up  - Stopped diltiazem with LV dysfunction and replaced with metoprolol - Stopped ARB with impaired renal function and started BiDil. 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 risk at this time.  - LDL 78 > statin started. - Suspect LE edema is also due to venous insufficiency given body habitus. Elevate legs, likely couldn't get TED hose on at this time.   Borderline hyperkalemia: Resolved. Due to ARB, supplements.  AFib with RVR: Rate now better controlled with bradycardia without pauses. TSH 1.876. - Continue metoprolol - Continue eliquis, continue dose with age under 45 and weight above 60kg.   AKI on stage III CKD: SCr 1.18 on admission, up to 1.5 and stable x48 hours. U/S with 12cm (L), 10.9cm (R) slightly increased echogenicity (c/w CKD) and no definite hydro, bilateral ureteral jets noted.  - Monitor BMP at follow up, consider restarting ARB. - Avoid nephrotoxins. With continued rise, stopped ARB and will deescalate diuresis.  Left toe blister: Without evidence of cellulitis, favor that this is blister vs. paronychia.  - Discussed with patient need for I&D if grows/becomes painful.  Hypertensive urgency: Resolved - BP meds as above  Hypokalemia:  - Needs daily supplement with lasix.  T2DM: HbA1c 7.1% - Continue CBG, SSI to AC/HS  MGUS:  - Continue observation per Dr. Geralyn Flash last night in 2017 she is due for follow up, especially if renal insufficiency continues.   Discharge Instructions Discharge Instructions    (HEART FAILURE PATIENTS)  Call MD:  Anytime you have any of the following symptoms: 1) 3 pound weight gain in 24 hours or 5 pounds in 1 week 2) shortness of breath, with or without a dry hacking cough 3) swelling in the hands, feet or stomach 4) if you have to sleep on extra pillows at night  in order to breathe.   Complete by:  As directed    Diet - low sodium heart healthy   Complete by:  As directed    Discharge instructions   Complete by:  As directed    You were admitted with leg swelling and acute heart failure which has been treated with IV lasix and improved. New medications for blood pressure and to help heal your heart have been started and prescriptions sent to your pharmacy. You are stable for discharge with the following recommendations:  - Call to schedule a follow up appointment with your primary doctor and cardiology office tomorrow. You need repeat labs within the next week.  - Start taking lasix 80mg  (up from 40mg ) daily, and take potassium daily with this as lasix makes you urinate potassium.  - STOP taking diltiazem and losartan. These have been replaced by metoprolol (50mg  twice daily) and a combination pill called Bidil (20-37.5mg  three times daily).  - For elevated cholesterol, atorvastatin 20mg  daily was started during this hospitalization.  - If you develop weight gain, shortness of breath, or other concerning symptom, seek medical advise right away.  - If your left toe becomes red, swollen, and painful, seek medical attention right away. Otherwise just soak it in warm water a few times per day.   Increase activity slowly   Complete by:  As directed      Allergies as of 09/02/2017   No Known Allergies     Medication List    STOP taking these medications   diltiazem 240 MG 24 hr capsule Commonly known as:  DILACOR XR   losartan 100 MG tablet Commonly known as:  COZAAR   metolazone 5 MG tablet Commonly known as:  ZAROXOLYN     TAKE these medications   atorvastatin 20 MG tablet Commonly known as:  LIPITOR Take 1 tablet (20 mg total) by mouth daily at 6 PM.   brimonidine 0.2 % ophthalmic solution Commonly known as:  ALPHAGAN Place 1 drop into both eyes 2 (two) times daily.   ELIQUIS 5 MG Tabs tablet Generic drug:  apixaban Take 5 mg by  mouth 2 (two) times daily.   furosemide 80 MG tablet Commonly known as:  LASIX Take 1 tablet (80 mg total) by mouth daily. What changed:    medication strength  how much to take  when to take this  reasons to take this   isosorbide-hydrALAZINE 20-37.5 MG tablet Commonly known as:  BIDIL Take 1 tablet by mouth 3 (three) times daily.   latanoprost 0.005 % ophthalmic solution Commonly known as:  XALATAN Place 1 drop into both eyes at bedtime.   metoprolol tartrate 50 MG tablet Commonly known as:  LOPRESSOR Take 1 tablet (50 mg total) by mouth 2 (two) times daily.   potassium chloride SA 20 MEQ tablet Commonly known as:  K-DUR,KLOR-CON Take 2 tablets (40 mEq total) by mouth daily. Start taking on:  09/03/2017      Follow-up Information    Brigitte Pulse, DO. Schedule an appointment as soon as possible for a visit in 1 week(s).   Specialty:  Family Medicine Contact information: [address] [place] [address]  [phone]          No Known Allergies  Consultations:  Cardiology  Procedures/Studies: Dg Chest 2 View  Result Date: 08/28/2017 CLINICAL DATA:  Pt reports central chest pain x2 days. Pt endorses some sob and leg swelling. Hx of afib per pt. Hx of HTN. Nonsmoker. EXAM: CHEST - 2 VIEW COMPARISON:  None. FINDINGS: Enlarged cardiac silhouette. RIGHT hilar fullness. No effusion, infiltrate or pneumothorax. No acute osseous abnormality. IMPRESSION: 1. Cardiomegaly without acute cardiopulmonary findings. 2. RIGHT hilar prominence may represent adenopathy versus prominent vascular structures. No comparison available. Recommend CT thorax without contrast for further evaluation Electronically Signed   By: Suzy Bouchard M.D.   On: 08/28/2017 08:51   Ct Chest Wo Contrast  Result Date: 08/28/2017 CLINICAL DATA:  Central chest pain for the past 2 days with shortness of breath. Prominent right hilum on chest x-ray. EXAM: CT CHEST WITHOUT CONTRAST  TECHNIQUE: Multidetector CT imaging of the chest was performed following the standard protocol without IV contrast. COMPARISON:  Chest x-ray from same day. FINDINGS: Cardiovascular: Moderate cardiomegaly. No pericardial effusion. Normal caliber thoracic aorta. Coronary, aortic arch, and branch vessel atherosclerotic vascular disease. Mediastinum/Nodes: No enlarged mediastinal or axillary lymph nodes. Evaluation for hilar lymphadenopathy is limited without intravenous contrast. 1.3 cm hypodense nodule in the right thyroid lobe. The trachea and esophagus demonstrate no significant findings. Lungs/Pleura: No focal consolidation, pleural effusion, or pneumothorax. There are a few small scattered pulmonary nodules in both lungs, the two largest of which measure 6 x 4 mm in the right upper lobe (series 4, image 66) and left lower lobe (series 4, image 93). Upper Abdomen: No acute abnormality. 2.5 cm right adrenal adenoma versus exophytic upper pole right renal cyst. Musculoskeletal: No chest wall mass or suspicious bone lesions identified. Mild diffuse subcutaneous chest wall edema. IMPRESSION: 1. Evaluation is limited without intravenous contrast, however right hilar fullness seen on chest x-ray most likely corresponds to prominent vascular structures. 2. Few scattered small pulmonary nodules in both lungs, the largest of which measure 5 mm in aggregate. No follow-up needed if patient is low-risk (and has no known or suspected primary neoplasm). Non-contrast chest CT can be considered in 12 months if patient is high-risk. This recommendation follows the consensus statement: Guidelines for Management of Incidental Pulmonary Nodules Detected on CT Images: From the Fleischner Society 2017; Radiology 2017; 284:228-243. 3. Moderate cardiomegaly. 4. Aortic atherosclerosis (ICD10-I70.0). Electronically Signed   By: Titus Dubin M.D.   On: 08/28/2017 09:49   US Renal  Result Date: 08/30/2017 CLINICAL DATA:  Acute kidney  injury. EXAM: RENAL / URINARY TRACT ULTRASOUND COMPLETE COMPARISON:  None. FINDINGS: Right Kidney: Length: 12.0 cm. No hydronephrosis. No focal renal parenchymal lesion. Echogenicity slightly increased. Left Kidney: Length: 10.9 cm. Mild fullness of the renal collecting system. No focal renal lesions seen. Echogenicity slightly increased. Bladder: Bilateral ureteral jets evident. IMPRESSION: Kidneys show normal size but slightly increased echogenicity. Mild fullness of the renal collecting system on the left, significance doubtful, particularly given the presence of bilateral ureteral jets. Electronically Signed   By: Nelson Chimes M.D.   On: 08/30/2017 12:49     Echocardiogram 08/28/2017: - Left ventricle: The cavity size was normal. Wall thickness was increased in a pattern of mild LVH. There was focal basal hypertrophy. Systolic function was mildly to moderately reduced. The estimated ejection fraction was in the range of 40% to 45%. - Mitral valve: There was moderate regurgitation. - Left atrium: The atrium was severely dilated. -  Right ventricle: The cavity size was moderately dilated. Systolic function was mildly to moderately reduced. - Right atrium: The atrium was moderately dilated. - Tricuspid valve: There was moderate-severe regurgitation. - Pulmonary arteries: Systolic pressure was moderately increased. PA peak pressure: 42 mm Hg (S). - Pericardium, extracardiac: A trivial pericardial effusion was identified.  Subjective: Feels well. Breathing normally with exertion, no chest pain. Leg swelling improved. Left toe nontender.   Discharge Exam: Vitals:   09/02/17 0508 09/02/17 1134  BP: 126/80 116/66  Pulse: 76 (!) 52  Resp: 18 20  Temp: 97.7 F (36.5 C) 97.6 F (36.4 C)  SpO2: 97% 100%   General: Pt is alert, awake, not in acute distress Cardiovascular: RRR, S1/S2 +, no rubs, no gallops Respiratory: CTA bilaterally, no wheezing, no rhonchi Abdominal: Soft, NT,  ND, bowel sounds + Extremities: 1+ pitting edema, no cyanosis Skin: Left great toe with small nontender nontense blister bordering proximal nail fold. No surrounding erythema/tenderness.   Labs: BNP (last 3 results) Recent Labs    08/28/17 0749  BNP 536.1*   Basic Metabolic Panel: Recent Labs  Lab 08/28/17 0807 08/29/17 0619 08/30/17 0612 08/31/17 0657 09/01/17 0416 09/02/17 0625  NA  --  142 139 137 139 138  K  --  3.4* 5.0 3.7 3.2* 3.3*  CL  --  93* 93* 92* 93* 91*  CO2  --  38* 34* 34* 37* 38*  GLUCOSE  --  138* 145* 135* 122* 125*  BUN  --  20 31* 36* 38* 42*  CREATININE  --  1.28* 1.41* 1.44* 1.56* 1.51*  CALCIUM  --  9.0 8.8* 8.5* 8.0* 8.6*  MG 2.0  --   --   --   --   --    Liver Function Tests: Recent Labs  Lab 08/28/17 0807 08/29/17 0619 08/31/17 0657  AST 29 31 25   ALT 24 26 24   ALKPHOS 68 64 66  BILITOT 1.6* 1.5* 1.1  PROT 8.3* 7.7 7.7  ALBUMIN 3.5 3.2* 3.1*   Recent Labs  Lab 08/28/17 0807  LIPASE 33   No results for input(s): AMMONIA in the last 168 hours. CBC: Recent Labs  Lab 08/28/17 0749 08/29/17 0619  WBC 3.5* 4.5  HGB 15.6* 15.1*  HCT 48.9* 47.4*  MCV 96.3 96.9  PLT 165 169   Cardiac Enzymes: Recent Labs  Lab 08/28/17 1511 08/28/17 1850  TROPONINI 0.04* 0.04*   BNP: Invalid input(s): POCBNP CBG: Recent Labs  Lab 09/01/17 1213 09/01/17 1656 09/01/17 2144 09/02/17 0710 09/02/17 1129  GLUCAP 176* 167* 123* 97 125*   D-Dimer No results for input(s): DDIMER in the last 72 hours. Hgb A1c No results for input(s): HGBA1C in the last 72 hours. Lipid Profile No results for input(s): CHOL, HDL, LDLCALC, TRIG, CHOLHDL, LDLDIRECT in the last 72 hours. Thyroid function studies No results for input(s): TSH, T4TOTAL, T3FREE, THYROIDAB in the last 72 hours.  Invalid input(s): FREET3 Anemia work up No results for input(s): VITAMINB12, FOLATE, FERRITIN, TIBC, IRON, RETICCTPCT in the last 72 hours. Urinalysis    Component  Value Date/Time   COLORURINE STRAW (A) 08/28/2017 0840   APPEARANCEUR CLEAR 08/28/2017 0840   LABSPEC 1.004 (L) 08/28/2017 0840   PHURINE 7.0 08/28/2017 0840   GLUCOSEU NEGATIVE 08/28/2017 0840   HGBUR LARGE (A) 08/28/2017 0840   BILIRUBINUR NEGATIVE 08/28/2017 0840   KETONESUR NEGATIVE 08/28/2017 0840   PROTEINUR NEGATIVE 08/28/2017 0840   NITRITE NEGATIVE 08/28/2017 0840   LEUKOCYTESUR NEGATIVE 08/28/2017 0840  Microbiology Recent Results (from the past 240 hour(s))  Urine culture     Status: Abnormal   Collection Time: 08/28/17  8:40 AM  Result Value Ref Range Status   Specimen Description URINE, CLEAN CATCH  Final   Special Requests   Final    NONE Performed at [facility], [address] [place], [place] [address]    Culture MULTIPLE SPECIES PRESENT, SUGGEST RECOLLECTION (A)  Final   Report Status 08/29/2017 FINAL  Final    Time coordinating discharge: Approximately 40 minutes  Patrecia Pour, MD  [facility] 09/02/2017, 2:57 PM Pager [phone]

## 2017-09-02 NOTE — Progress Notes (Signed)
Discharge instructions given to patient and daughter at bedside. All questions answered.

## 2017-09-02 NOTE — Progress Notes (Signed)
DAILY PROGRESS NOTE   Patient Name: [name] Long Date of Encounter: 09/02/2017  Chief Complaint   No complaints  Patient Profile   77 yo female from [place] with progresive DOE, edema and CHF symptoms, found to have an LVEF of 40-45% with moderate MR and severe LAE, there is mild to moderate RV dysfunction as well.  Subjective   Negative 1.6L overnight - now 8.7L negative. Weight down another 2 lbs to 205 lbs today. Switched to oral lasix. Creatinine slightly improved at 1.51 today.  Objective   Vitals:   09/01/17 2156 09/02/17 0021 09/02/17 0508 09/02/17 1134  BP:   126/80 116/66  Pulse: 65  76 (!) 52  Resp:   18 20  Temp:  (!) 97.4 F (36.3 C) 97.7 F (36.5 C) 97.6 F (36.4 C)  TempSrc:  Oral Oral Oral  SpO2:   97% 100%  Weight:   205 lb (93 kg)   Height:        Intake/Output Summary (Last 24 hours) at 09/02/2017 1317 Last data filed at 09/02/2017 0206 Gross per 24 hour  Intake 240 ml  Output 1100 ml  Net -860 ml   Filed Weights   08/31/17 0400 09/01/17 0528 09/02/17 0508  Weight: 208 lb 12.8 oz (94.7 kg) 207 lb 1.6 oz (93.9 kg) 205 lb (93 kg)    Physical Exam   General appearance: alert and no distress Lungs: clear to auscultation bilaterally Heart: irregularly irregular rhythm Extremities: edema 1+ bilateral pitting edema Neurologic: Grossly normal  Inpatient Medications    Scheduled Meds: . apixaban  5 mg Oral BID  . atorvastatin  20 mg Oral q1800  . brimonidine  1 drop Both Eyes BID  . furosemide  80 mg Oral Daily  . insulin aspart  0-5 Units Subcutaneous QHS  . insulin aspart  0-9 Units Subcutaneous TID WC  . isosorbide-hydrALAZINE  1 tablet Oral TID  . latanoprost  1 drop Both Eyes QHS  . metoprolol tartrate  50 mg Oral BID  . mupirocin ointment   Topical BID  . potassium chloride  40 mEq Oral Daily    Continuous Infusions:   PRN Meds: labetalol   Labs   Results for orders placed or performed during the hospital encounter of  08/28/17 (from the past 48 hour(s))  Glucose, capillary     Status: None   Collection Time: 08/31/17  4:27 PM  Result Value Ref Range   Glucose-Capillary 90 70 - 99 mg/dL   Comment 1 Notify RN    Comment 2 Document in Chart   Glucose, capillary     Status: Abnormal   Collection Time: 08/31/17  9:28 PM  Result Value Ref Range   Glucose-Capillary 111 (H) 70 - 99 mg/dL   Comment 1 Notify RN    Comment 2 Document in Chart   Basic metabolic panel     Status: Abnormal   Collection Time: 09/01/17  4:16 AM  Result Value Ref Range   Sodium 139 135 - 145 mmol/L   Potassium 3.2 (L) 3.5 - 5.1 mmol/L   Chloride 93 (L) 98 - 111 mmol/L    Comment: Please note change in reference range.   CO2 37 (H) 22 - 32 mmol/L   Glucose, Bld 122 (H) 70 - 99 mg/dL    Comment: Please note change in reference range.   BUN 38 (H) 8 - 23 mg/dL    Comment: Please note change in reference range.   Creatinine, Ser 1.56 (  H) 0.44 - 1.00 mg/dL   Calcium 8.0 (L) 8.9 - 10.3 mg/dL   GFR calc non Af Amer 31 (L) >60 mL/min   GFR calc Af Amer 36 (L) >60 mL/min    Comment: (NOTE) The eGFR has been calculated using the CKD EPI equation. This calculation has not been validated in all clinical situations. eGFR's persistently <60 mL/min signify possible Chronic Kidney Disease.    Anion gap 9 5 - 15    Comment: Performed at [facility] [address], [place], [place] [address]  Glucose, capillary     Status: None   Collection Time: 09/01/17  8:03 AM  Result Value Ref Range   Glucose-Capillary 87 70 - 99 mg/dL   Comment 1 Notify RN    Comment 2 Document in Chart   Glucose, capillary     Status: Abnormal   Collection Time: 09/01/17 12:13 PM  Result Value Ref Range   Glucose-Capillary 176 (H) 70 - 99 mg/dL   Comment 1 Notify RN    Comment 2 Document in Chart   Glucose, capillary     Status: Abnormal   Collection Time: 09/01/17  4:56 PM  Result Value Ref Range   Glucose-Capillary 167 (H) 70 - 99 mg/dL    Comment 1 Notify RN    Comment 2 Document in Chart   Glucose, capillary     Status: Abnormal   Collection Time: 09/01/17  9:44 PM  Result Value Ref Range   Glucose-Capillary 123 (H) 70 - 99 mg/dL  Basic metabolic panel     Status: Abnormal   Collection Time: 09/02/17  6:25 AM  Result Value Ref Range   Sodium 138 135 - 145 mmol/L   Potassium 3.3 (L) 3.5 - 5.1 mmol/L   Chloride 91 (L) 98 - 111 mmol/L    Comment: Please note change in reference range.   CO2 38 (H) 22 - 32 mmol/L   Glucose, Bld 125 (H) 70 - 99 mg/dL    Comment: Please note change in reference range.   BUN 42 (H) 8 - 23 mg/dL    Comment: Please note change in reference range.   Creatinine, Ser 1.51 (H) 0.44 - 1.00 mg/dL   Calcium 8.6 (L) 8.9 - 10.3 mg/dL   GFR calc non Af Amer 32 (L) >60 mL/min   GFR calc Af Amer 38 (L) >60 mL/min    Comment: (NOTE) The eGFR has been calculated using the CKD EPI equation. This calculation has not been validated in all clinical situations. eGFR's persistently <60 mL/min signify possible Chronic Kidney Disease.    Anion gap 9 5 - 15    Comment: Performed at [facility] [address] [place], [place] [address]  Glucose, capillary     Status: None   Collection Time: 09/02/17  7:10 AM  Result Value Ref Range   Glucose-Capillary 97 70 - 99 mg/dL   Comment 1 Notify RN    Comment 2 Document in Chart   Glucose, capillary     Status: Abnormal   Collection Time: 09/02/17 11:29 AM  Result Value Ref Range   Glucose-Capillary 125 (H) 70 - 99 mg/dL    ECG   N/A  Telemetry   Afib with CVR - Personally Reviewed  Radiology    No results found.  Cardiac Studies   N/A  Assessment   Active Problems:   Acute CHF (congestive heart failure) (HCC)   Hypertensive emergency   Obesity, Class III, BMI  40-49.9 (morbid obesity) (HCC)   Hypokalemia   Acute on chronic combined systolic and diastolic CHF (congestive heart failure) (HCC)   Persistent atrial fibrillation (HCC)    Essential hypertension   Stage 3 chronic kidney disease (HCC)   AKI (acute kidney injury) ([place])   Plan   1. Continues to diurese - now on oral lasix. Creatinine stable. Probably could be discharged today from our standpoint. No further suggestions at this time. Cardiology will sign-off. Call with questions. Follow-up with Dr. Martinique after discharge.  Time Spent Directly with Patient:  I have spent a total of 15 minutes with the patient reviewing hospital notes, telemetry, EKGs, labs and examining the patient as well as establishing an assessment and plan that was discussed personally with the patient.  > 50% of time was spent in direct patient care.  Length of Stay:  LOS: 5 days   Pixie Casino, MD, [facility], [place] Director of the Advanced Lipid Disorders &  Cardiovascular Risk Reduction Clinic Diplomate of the American Board of Clinical Lipidology Attending Cardiologist  Direct Dial: [phone]  Fax: [phone]  Website:  [email or web address] 09/02/2017, 1:17 PM

## 2017-10-10 ENCOUNTER — Encounter: Payer: Self-pay | Admitting: Occupational Therapy

## 2017-10-10 ENCOUNTER — Other Ambulatory Visit: Payer: Self-pay

## 2017-10-10 ENCOUNTER — Ambulatory Visit: Payer: Medicare Other | Attending: Orthopedic Surgery | Admitting: Occupational Therapy

## 2017-10-10 DIAGNOSIS — I890 Lymphedema, not elsewhere classified: Secondary | ICD-10-CM | POA: Insufficient documentation

## 2017-10-10 NOTE — Patient Instructions (Signed)

## 2017-10-11 NOTE — Therapy (Signed)
[place] MAIN [facility] SERVICES [address] [place], [place], [address] Phone: [phone]   Fax:  [phone]  Occupational Therapy Evaluation  Patient Details  Name: [name] Long MRN: [id number] Date of Birth: [birth year] Referring Provider: Lowella Petties, MD   Encounter Date: 10/10/2017  OT End of Session - 10/10/17 1157    Visit Number  1    Number of Visits  38    Date for OT Re-Evaluation  01/08/18    OT Start Time  0800    OT Stop Time  0910    OT Time Calculation (min)  70 min       Past Medical History:  Diagnosis Date  . Atrial fibrillation ([place])   . Hypertension     Past Surgical History:  Procedure Laterality Date  . CESAREAN SECTION      There were no vitals filed for this visit.  Subjective Assessment - 10/10/17 1241    Subjective   [name] Long is referred to Occupational Therapy for evaluation and treatment of BLE lymphedema by Dorna Leitz, MD. [name] Long is accompanied today by her daughter [name] Long. [name] Long reports sudden onset of BLE swelling in May 2019 with no known precipitating event . Pt states she is unable to lift her legs and walking has become more difficult as swellibng continues to worsen over time.  Pt denies falls in the last moth and denies infection in her legs. She has not previously undergone Complete Decongestive Therapy for lymphedema (LE) care, and she has not worn compression stockings in the past. Pt's stated goal for OT is, "to get the swelling out so I can get in and out of the car easier, so I can get around better."    Patient is accompained by:  Family member    Pertinent History  contributing comorbidities include: acute CHF, persistent Afib, HTN, Stage III CKD, obesity, class III    Limitations  difficulty walking, fall risk, chronic, progressive BLE swelling and    pain,  impaired transfers and functional mobility, decreased activity tolerance, impaired balance, limitted standing tolerance-  dependent position exacerbates BLE swelling and discomfort, impaired stair climbing (difficulty lifting legs), BLE weakness, difficulty fitting LB clothing and street shoes due to progressive LE swelling, impaired basic ADLs, LB dressing, bathing, skin and nail care, diabetic skin infection)    Currently in Pain?  Yes    Pain Score  5     Pain Location  Leg    Pain Orientation  Right;Left    Pain Descriptors / Indicators  Heaviness;Tender;Discomfort;Tightness;Tiring    Pain Type  Chronic pain    Pain Onset  Other (comment)   onset May 2019   Aggravating Factors   dependent positioning, standing, walking, sitting    Pain Relieving Factors  elevation    Effect of Pain on Daily Activities  see LIMITATIONS        [facility] OT Assessment - 10/11/17 0001      Assessment   Medical Diagnosis  Severe, BLE Lymphedema 2/2 obesity and systemic fluid retention (lymphatic overload)    Referring Provider  Lowella Petties, MD    Onset Date/Surgical Date  --   3 months, acote   Prior Therapy  no      Precautions   Precautions  Fall;Other (comment)   skin     Restrictions   Weight Bearing Restrictions  No      Balance Screen   Has the patient fallen in the  past 6 months  No    Has the patient had a decrease in activity level because of a fear of falling?   Yes    Is the patient reluctant to leave their home because of a fear of falling?   No      Home  Environment   Family/patient expects to be discharged to:  Private residence    [place]    Available Help at Discharge  Family    Type of [place] Access  Level entry    Lives With  Son      Prior Function   Level of [place] with basic ADLs;Independent with household mobility without device;Independent with transfers;Needs assistance with homemaking      IADL   Prior Level of Function Shopping  I    Shopping  Needs to be accompanied on any shopping trip    Prior Level of Function Light  Housekeeping  I    Light Housekeeping  Performs light daily tasks but cannot maintain acceptable level of cleanliness    Prior Level of Function Meal Prep  I    Meal Prep  Able to complete simple cold meal and snack prep    Prior Level of Function Community Mobility  I    Nurse, children's  Travel limited to taxi or vehicle with assistance of another    Prior Level of Function Medication Managment  I    Medication Management  Takes responsibility if medication is prepared in advance in seperate dosage      Mobility   Mobility Status  Needs assist;History of falls      Vision - History   Baseline Vision  Wears glasses all the time      Activity Tolerance   Activity Tolerance Comments  NT      Cognition   Overall Cognitive Status  Within Functional Limits for tasks assessed      Sensation   Light Touch  Appears Intact      Coordination   Gross Motor Movements are Fluid and Coordinated  Yes      ROM / Strength   AROM / PROM / Strength  Strength;AROM      AROM   Overall AROM Comments  AROM limited at bilateral hips, knees and ankles    due to   girth           due to leg swelling      Strength   Overall Strength  Deficits    Overall Strength Comments  mild generalized weakness     Moderate-Severe Stage II , BLE lymphedema 2/2 Obesity and lymphatic overload due to systemic fluid retention  Skin Description Hyper-Keratosis [place] Shiny Tight Fibrotic Fatty Doughy Indurated     x X Fatty fibrosis ankles, distal legs and dorsal feet  x x    Hydration Dry Flaky Erythema Other   x x     Color Redness Present Pallor Blanching Hemosiderin Staining Other      Trace @ distal  legs, L>R    Odor Malodorous Yeast  Absent      x   Temperature Warm Cool Normal    x    Pitting Edema   1+ 2+ 3+ 4+ Non-pitting      x     Girth Symmetrical Asymmetrical Other Distribution    L>R densest congestion below knees   Stemmer Sign Positive Negative     +  bilaterally     Lymphorrea History Of:  Present Absent    x     Wounds History Of Present Absent Venous Arterial Pressure Size   denies  x          Signs of Infection Redness Warmth Erythema Acute Swelling Drainage Borders                   Scars Adhesions Hypersensitivity        Sensation Light Touch Deep pressure Hypersensitivty   Present Impaired Present Impaired Present Impaired            x  Nails WNL Fungus Present Other    x    Hair Growth Symmetrical Asymmetrical   unremarkable    Skin Creases Base of toes Ankle Base of Finger Medial Thigh         x x  x        LYMPHEDEMA/ONCOLOGY QUESTIONNAIRE - 10/11/17 1354      Lymphedema Stage   Stage  --   Severe, BLE, Stage II     Lymphedema Assessments   Lymphedema Assessments  Lower extremities             OT Treatments/Exercises (OP) - 10/11/17 0001      Bed Mobility   Bed Mobility  Supine to Sit;Sit to Supine    Supine to Sit  Moderate Assistance - Patient 50-74%    Sit to Supine  Minimal Assistance - Patient > 75%      Transfers   Transfers  Sit to Stand;Stand to Sit    Sit to Stand  4: Min assist    Stand to Sit  4: Min assist;With upper extremity assist;With armrests;Uncontrolled descent    Comments  Max A to lift legs onto treatment table      ADLs   Overall ADLs  impaired bathing, dressing, skin care, nail care,     LB Dressing  unable to fit preferred street shoes 2/2 leg/foot swelling    Bathing  unable to reach feet    Functional Mobility  impaired bed mobility and all transfers    Home Maintenance  unable    Driving  does not drive    Leisure  family    ADL Education Given  Yes      Manual Therapy   Manual Therapy  Edema management            OT Education - 10/10/17 1404    Education Details  Provided Pt and family skilled education regarding lymphatic structure and function, various lymphedema etiologies, obnset patterns and progression,  impact of obesity on lymphatic system  function, and  discussed Complete Decongestive Therapy for LE care, including 4 components of Intensive and Self Management Phases.  Discussed   Importance of daily daily LE self care to retain clinical gains and limit progression.  Discussed lymphedema precautions, cellulitis risk,  Provided printed Lymphedema Workbook for reference.    Person(s) Educated  Patient;Child(ren)    Methods  Explanation;Demonstration;Handout    Comprehension  Verbalized understanding;Returned demonstration          OT Long Term Goals - 10/11/17 1433      OT LONG TERM GOAL #1   Title  Pt modified independent w/ lymphedema precautions/prevention principals and using printed reference to limit LE progression and infection risk.    Baseline  Max A    Time  2    Period  Weeks    Status  New  Target Date  --   10th OT visit     OT LONG TERM GOAL #2   Title  Lymphedema (LE) management/ self-care: Pt able to apply knee length gradient compression wraps with maximum caregiver assistance daily between OT visits using correct gradient techniques within 2 weeks to achieve optimal limb volume reduction during Intensive Phase of CDT and optimal self-management over time.    Baseline  Dependent    Time  2    Period  Weeks    Status  New    Target Date  --   10th OT visit     OT LONG TERM GOAL #3   Title  Lymphedema (LE) management/ self-care:  Pt to achieve at least 10%  limb volume reduction in BLE  during Intensive Phase CDT to limit LE progression, to decrease infection risk and to improve functional mobility and ambulation essential for optimal ADLs performance.    Baseline  dependent    Time  12    Period  Weeks    Status  New    Target Date  01/08/18      OT LONG TERM GOAL #4   Title  Lymphedema (LE) management/ self-care:  Pt to tolerate daily compression garments and/ or HOS devices in keeping w/ prescribed wear regime within 1 week of issue date to restore normal limb shape to reduce tissue density and  protein build up leading to progression.    Baseline  Dependent    Time  12    Period  Weeks    Status  New    Target Date  01/08/18      OT LONG TERM GOAL #5   Title  Lymphedema management/ Self-care: Pt able to don and doff compression garments and/ or devices with maximum assistance using assistive devices and extra times PRN in order to ensure optimal lymphedema management over time to limit progression risk and infection.    Baseline  Dependent    Time  12    Period  Weeks    Status  New    Target Date  01/08/18      Long Term Additional Goals   Additional Long Term Goals  Yes      OT LONG TERM GOAL #6   Title  Lymphedema (LE) management/ self-care:  During Management Phase CDT Pt to sustain reduced limb volumes achieved during Intensive Phase CDT at follow up visits during Management Phase CDT within 3% using LE self-care home program components as directed ( simple self MLD, skin care, ther ex and daily/ nightly compression garments/ devices) .    Baseline  dependent    Time  6    Period  Months    Status  New    Target Date  04/09/18            Plan - 10/11/17 1405    Clinical Impression Statement  [name] Long is a 77 y o female presenting with moderate-sever, BLE lymphedema (LE) secondary to systemic fluid retention w/ lymphatic overload and morbid obesity. Onset was acute in May 2019, and has worsened over time. Pt is diagnosed with several exacerbating conditions, including stage III chronic kidney disease, congestive heart failure, hypertension and persistent afib. Chronic, progressive BLE swelling and associated pain/ discomfort contribute to functional limitations with ambulation and transfers, basic and instrumental ADLs, decreased ability to perform productive activities and to participate in leisure pursuits. Pt scored an 88 on the Lymphedema Life Impact Scale (LLIS), a tool measuring  the extent to which people experiencing problems associated with lymphedema have  been affected by those problems in the past week. [name]. [place] score translates into a 24% perceived functional impairment due to LE.  Skilled Occupational Therapy for intensive and self-management phases of Complete Decongestive Therapy (CDT) are medically necessary to reduce limb swelling, to decrease infection and fall risk, and to improve functional independence and safety. Without skilled OT for CDT chronic, progressive LE will worsen and further functional decline is expected.    Occupational Profile and client history currently impacting functional performance  lives with son    as is unable to climb stairs at entrance of daughter's home; plans to return to home in [place] in October 2019; unsure about assistance in [place] as all adults children reside here    Occupational performance deficits (Please refer to evaluation for details):  ADL's;IADL's    Rehab Potential  Fair    Current Impairments/barriers affecting progress:  Without max caregiver assistance with LE self-care home program between visits prognosis for improvement is poor    OT Frequency  3x / week    OT Duration  12 weeks   and PRN due to severity of BLE   OT Treatment/Interventions  Self-care/ADL training;Therapeutic exercise;Functional Mobility Training;Manual Therapy;Manual lymph drainage;Therapeutic activities;Coping strategies training;DME and/or AE instruction;Compression bandaging;Other (comment);Patient/family education   fit with custom, BLE compression garments and hours of sleep devices   Plan  CDT includes manual lymphatic drainage (MLD), skin care, compression wrapping and garment fitting, therapeutic exercise and emphasis on Pt and CG self care training    Clinical Decision Making  Multiple treatment options, significant modification of task necessary    Consulted and Agree with Plan of Care  Patient;Family member/caregiver       Patient will benefit from skilled therapeutic intervention in order to improve the  following deficits and impairments:  Abnormal gait, Decreased endurance, Decreased skin integrity, Cardiopulmonary status limiting activity, Decreased knowledge of precautions, Impaired perceived functional ability, Decreased activity tolerance, Decreased knowledge of use of DME, Decreased strength, Decreased balance, Decreased mobility, Difficulty walking, Obesity, Decreased range of motion, Increased edema, Pain  Visit Diagnosis: Lymphedema, not elsewhere classified - Plan: Ot plan of care cert/re-cert    Problem List Patient Active Problem List   Diagnosis Date Noted  . AKI (acute kidney injury) ([place])   . Acute CHF (congestive heart failure) ([place]) 08/28/2017  . Hypertensive emergency 08/28/2017  . Obesity, Class III, BMI 40-49.9 (morbid obesity) ([place]) 08/28/2017  . Hypokalemia 08/28/2017  . Acute on chronic combined systolic and diastolic CHF (congestive heart failure) ([place])   . Persistent atrial fibrillation ([place])   . Essential hypertension   . Stage 3 chronic kidney disease ([place])   . Hypercholesterolemia 12/17/2015  . Routine health maintenance 07/23/2015  . 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 [place], [place], [address] Phone: [phone]   Fax:  [phone]  Name: Tamya Denardo MRN: [id number] Date of Birth: Mar 07, 1940

## 2017-10-15 ENCOUNTER — Ambulatory Visit: Payer: Medicare Other | Admitting: Occupational Therapy

## 2017-10-15 DIAGNOSIS — I890 Lymphedema, not elsewhere classified: Secondary | ICD-10-CM | POA: Diagnosis not present

## 2017-10-15 NOTE — Patient Instructions (Signed)

## 2017-10-15 NOTE — Therapy (Signed)
[place] MAIN [facility] SERVICES [address] [place], [place], [address] Phone: [phone]   Fax:  [phone]  Occupational Therapy Treatment  Patient Details  Name: [name] Long MRN: [id number] Date of Birth: 1940-10-09 Referring Provider: Lowella Petties, MD   Encounter Date: 10/15/2017  OT End of Session - 10/15/17 0955    Visit Number  2    Number of Visits  38    Date for OT Re-Evaluation  01/08/18    OT Start Time  0800    OT Stop Time  0911    OT Time Calculation (min)  71 min       Past Medical History:  Diagnosis Date  . Atrial fibrillation ([place])   . Hypertension     Past Surgical History:  Procedure Laterality Date  . CESAREAN SECTION      There were no vitals filed for this visit.  Subjective Assessment - 10/15/17 0818    Subjective   Pt presents for OT visit 2/36 to address BLE LE. Pt is accompanied by her daughter, [name]. Pt has no new complaints since initial evall.  (Pended)     Patient is accompained by:  Family member  (Pended)     Pertinent History  contributing comorbidities include: acute CHF, persistent Afib, HTN, Stage III CKD, obesity, class III  (Pended)     Limitations  difficulty walking, fall risk, chronic, progressive BLE swelling and    pain,  impaired transfers and functional mobility, decreased activity tolerance, impaired balance, limitted standing tolerance- dependent position exacerbates BLE swelling and discomfort, impaired stair climbing (difficulty lifting legs), BLE weakness, difficulty fitting LB clothing and street shoes due to progressive LE swelling, impaired basic ADLs, LB dressing, bathing, skin and nail care, diabetic skin infection)  (Pended)     Currently in Pain?  No/denies  (Pended)     Pain Onset  Other (comment)  (Pended)    onset May 2019         LYMPHEDEMA/ONCOLOGY QUESTIONNAIRE - 10/15/17 1607      Lymphedema Assessments   Lymphedema Assessments  Lower extremities       Right Lower Extremity Lymphedema   Other  LLE ankle to below knee (A-D) limb volume = 6250.41 ml. RLE knee to groin (E-G) volume = R11194.32 ml. RLE full leg (A-G) volume = 17444.73 ML.    Other  Initial comparative limb volume measurements reveal imb volume differential(LVD) of lrh below knee (A-D) measures 5.60 %, L>R; knee to groin LVD measures 11.45%, R>L; and LVD for full leg from ankle to groin (A-G) measures 5.17%, R>L. leg  measures       Left Lower Extremity Lymphedema   Other  RLE ankle to below knee (A-D) limb volume = 6250. ml. LLE knee to groin (E-G) volume = 9912.21 ml. LLE full leg (A-G) volume = 16,512.99 ML.              OT Treatments/Exercises (OP) - 10/15/17 0001      ADLs   ADL Education Given  Yes      Manual Therapy   Manual Therapy  Edema management;Compression Bandaging    Manual therapy comments  Completed intial  comparative limb volumetrics    Compression Bandaging  RLE knee length gradient compressoion wrap from foot to below knee (A-D landmarks). Utilized Rosidal foam in single  overlapping layer (~ 50%) over cotton stockinett, w/ 8,10 and 12 cm short stretch wraps applied in gradient config designed to move  fluid congestion from distal to proximal.             OT Education - 10/15/17 0953    Education Details  Intro level Pt edu for BLE lymphatic pumping ther ex. Pt and CG edu for purpose of limb volumetrics and intro level explanation of measurements and result    Person(s) Educated  Patient;Child(ren)    Methods  Explanation;Demonstration;Handout    Comprehension  Verbalized understanding;Returned demonstration          OT Long Term Goals - 10/11/17 1433      OT LONG TERM GOAL #1   Title  Pt modified independent w/ lymphedema precautions/prevention principals and using printed reference to limit LE progression and infection risk.    Baseline  Max A    Time  2    Period  Weeks    Status  New    Target Date  --   10th OT visit      OT LONG TERM GOAL #2   Title  Lymphedema (LE) management/ self-care: Pt able to apply knee length gradient compression wraps with maximum caregiver assistance daily between OT visits using correct gradient techniques within 2 weeks to achieve optimal limb volume reduction during Intensive Phase of CDT and optimal self-management over time.    Baseline  Dependent    Time  2    Period  Weeks    Status  New    Target Date  --   10th OT visit     OT LONG TERM GOAL #3   Title  Lymphedema (LE) management/ self-care:  Pt to achieve at least 10%  limb volume reduction in BLE  during Intensive Phase CDT to limit LE progression, to decrease infection risk and to improve functional mobility and ambulation essential for optimal ADLs performance.    Baseline  dependent    Time  12    Period  Weeks    Status  New    Target Date  01/08/18      OT LONG TERM GOAL #4   Title  Lymphedema (LE) management/ self-care:  Pt to tolerate daily compression garments and/ or HOS devices in keeping w/ prescribed wear regime within 1 week of issue date to restore normal limb shape to reduce tissue density and protein build up leading to progression.    Baseline  Dependent    Time  12    Period  Weeks    Status  New    Target Date  01/08/18      OT LONG TERM GOAL #5   Title  Lymphedema management/ Self-care: Pt able to don and doff compression garments and/ or devices with maximum assistance using assistive devices and extra times PRN in order to ensure optimal lymphedema management over time to limit progression risk and infection.    Baseline  Dependent    Time  12    Period  Weeks    Status  New    Target Date  01/08/18      Long Term Additional Goals   Additional Long Term Goals  Yes      OT LONG TERM GOAL #6   Title  Lymphedema (LE) management/ self-care:  During Management Phase CDT Pt to sustain reduced limb volumes achieved during Intensive Phase CDT at follow up visits during Management Phase CDT  within 3% using LE self-care home program components as directed ( simple self MLD, skin care, ther ex and daily/ nightly compression garments/ devices) .  Baseline  dependent    Time  6    Period  Months    Status  New    Target Date  04/09/18            Plan - 10/15/17 0955    Clinical Impression Statement  Pt identified RLE as "worse of the w legs". Pt is R dominant with weight of 218# at 62". Initial comparative limb volume measurements reveal imb volume differential(LVD) of lrh below knee (A-D) measures 5.60 %, L>R; knee to groin LVD measures 11.45%, R>L; and LVD for full leg from ankle to groin (A-G) measures 5.17%,  Applied RLE, multi layer compression wraps from foot to popliteal fossa. Instructed Provided Pt and daughter w/ entry level instruction on  gradient techniques  l Instructed Pt to remove wraps if she experiences pain and/ or atypical SOP, othewise leave in place until returning to clinic tomorrow AM. Cont as per POC.     Occupational Profile and client history currently impacting functional performance  lives with son    as is unable to climb stairs at entrance of daughter's home; plans to return to home in [place] in October 2019; unsure about assistance in [place] as all adults children reside here    Occupational performance deficits (Please refer to evaluation for details):  ADL's;IADL's;Leisure;Social Participation    Rehab Potential  Fair    Current Impairments/barriers affecting progress:  Without max caregiver assistance with LE self-care home program between visits prognosis for improvement is poor    OT Frequency  3x / week    OT Duration  12 weeks   and PRN due to severity of BLE   OT Treatment/Interventions  Self-care/ADL training;Therapeutic exercise;Functional Mobility Training;Manual Therapy;Manual lymph drainage;Therapeutic activities;Coping strategies training;DME and/or AE instruction;Compression bandaging;Other (comment);Patient/family education   fit  with custom, BLE compression garments and hours of sleep devices   Plan  CDT includes manual lymphatic drainage (MLD), skin care, compression wrapping and garment fitting, therapeutic exercise and emphasis on Pt and CG self care training    Clinical Decision Making  Multiple treatment options, significant modification of task necessary    Consulted and Agree with Plan of Care  Patient;Family member/caregiver       Patient will benefit from skilled therapeutic intervention in order to improve the following deficits and impairments:  Abnormal gait, Decreased endurance, Decreased skin integrity, Cardiopulmonary status limiting activity, Decreased knowledge of precautions, Impaired perceived functional ability, Decreased activity tolerance, Decreased knowledge of use of DME, Decreased strength, Decreased balance, Decreased mobility, Difficulty walking, Obesity, Decreased range of motion, Increased edema, Pain  Visit Diagnosis: Lymphedema, not elsewhere classified    Problem List Patient Active Problem List   Diagnosis Date Noted  . AKI (acute kidney injury) ([place])   . Acute CHF (congestive heart failure) ([place]) 08/28/2017  . Hypertensive emergency 08/28/2017  . Obesity, Class III, BMI 40-49.9 (morbid obesity) ([place]) 08/28/2017  . Hypokalemia 08/28/2017  . Acute on chronic combined systolic and diastolic CHF (congestive heart failure) ([place])   . Persistent atrial fibrillation ([place])   . Essential hypertension   . Stage 3 chronic kidney disease ([place])   . Hypercholesterolemia 12/17/2015  . Routine health maintenance 07/23/2015  . 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

## 2017-10-16 ENCOUNTER — Ambulatory Visit: Payer: Medicare Other | Admitting: Occupational Therapy

## 2017-10-16 DIAGNOSIS — I890 Lymphedema, not elsewhere classified: Secondary | ICD-10-CM | POA: Diagnosis not present

## 2017-10-16 NOTE — Therapy (Signed)
[place] MAIN [facility] SERVICES [address] [place], [place], [address] Phone: [phone]   Fax:  [phone]  Occupational Therapy Treatment  Patient Details  Name: [name] Long MRN: [id number] Date of Birth: 03/28/1940 Referring Provider: Lowella Petties, MD   Encounter Date: 10/16/2017  OT End of Session - 10/16/17 0906    Visit Number  3    Number of Visits  38    Date for OT Re-Evaluation  01/08/18    OT Start Time  0803    OT Stop Time  0858    OT Time Calculation (min)  55 min    Activity Tolerance  Patient tolerated treatment well;No increased pain    Behavior During Therapy  WFL for tasks assessed/performed       Past Medical History:  Diagnosis Date  . Atrial fibrillation ([place])   . Hypertension     Past Surgical History:  Procedure Laterality Date  . CESAREAN SECTION      There were no vitals filed for this visit.  Subjective Assessment - 10/16/17 0902    Subjective   Pt presents for OT visit 3/36 to address BLE LE. Pt is accompanied by her daughter, [name]. Pt reports she had no difficulty tolerating knee length compression wrap to RLE over night. Pt removed wraps to shower this morning and brings them to clinic this morning.    Patient is accompained by:  Family member    Pertinent History  contributing comorbidities include: acute CHF, persistent Afib, HTN, Stage III CKD, obesity, class III    Limitations  difficulty walking, fall risk, chronic, progressive BLE swelling and    pain,  impaired transfers and functional mobility, decreased activity tolerance, impaired balance, limitted standing tolerance- dependent position exacerbates BLE swelling and discomfort, impaired stair climbing (difficulty lifting legs), BLE weakness, difficulty fitting LB clothing and street shoes due to progressive LE swelling, impaired basic ADLs, LB dressing, bathing, skin and nail care, diabetic skin infection)    Currently in Pain?  Yes    chronic leg discomfort unchanged. Not rated numerically   Pain Onset  Other (comment)   onset May 2019                  OT Treatments/Exercises (OP) - 10/16/17 0001      ADLs   ADL Education Given  Yes      Manual Therapy   Manual Therapy  Edema management;Compression Bandaging    Edema Management  skin care below knee to RLE using low ph Eucerin lotion before compression wrapping    Compression Bandaging  RLE knee length gradient compressoion wrap from foot to below knee (A-D landmarks). Utilized Rosidal foam in single  overlapping layer (~ 50%) over cotton stockinett, w/ 8,10 and 12 cm short stretch wraps applied in gradient config designed to move fluid congestion from distal to proximal.             OT Education - 10/16/17 0906    Education Details  In depth  ASDL training for gradient compression wrap to knee for entire session    Person(s) Educated  Patient;Child(ren)    Methods  Explanation;Demonstration;Handout    Comprehension  Verbalized understanding;Returned demonstration          OT Long Term Goals - 10/11/17 1433      OT LONG TERM GOAL #1   Title  Pt modified independent w/ lymphedema precautions/prevention principals and using printed reference to limit LE progression and infection  risk.    Baseline  Max A    Time  2    Period  Weeks    Status  New    Target Date  --   10th OT visit     OT LONG TERM GOAL #2   Title  Lymphedema (LE) management/ self-care: Pt able to apply knee length gradient compression wraps with maximum caregiver assistance daily between OT visits using correct gradient techniques within 2 weeks to achieve optimal limb volume reduction during Intensive Phase of CDT and optimal self-management over time.    Baseline  Dependent    Time  2    Period  Weeks    Status  New    Target Date  --   10th OT visit     OT LONG TERM GOAL #3   Title  Lymphedema (LE) management/ self-care:  Pt to achieve at least 10%  limb volume  reduction in BLE  during Intensive Phase CDT to limit LE progression, to decrease infection risk and to improve functional mobility and ambulation essential for optimal ADLs performance.    Baseline  dependent    Time  12    Period  Weeks    Status  New    Target Date  01/08/18      OT LONG TERM GOAL #4   Title  Lymphedema (LE) management/ self-care:  Pt to tolerate daily compression garments and/ or HOS devices in keeping w/ prescribed wear regime within 1 week of issue date to restore normal limb shape to reduce tissue density and protein build up leading to progression.    Baseline  Dependent    Time  12    Period  Weeks    Status  New    Target Date  01/08/18      OT LONG TERM GOAL #5   Title  Lymphedema management/ Self-care: Pt able to don and doff compression garments and/ or devices with maximum assistance using assistive devices and extra times PRN in order to ensure optimal lymphedema management over time to limit progression risk and infection.    Baseline  Dependent    Time  12    Period  Weeks    Status  New    Target Date  01/08/18      Long Term Additional Goals   Additional Long Term Goals  Yes      OT LONG TERM GOAL #6   Title  Lymphedema (LE) management/ self-care:  During Management Phase CDT Pt to sustain reduced limb volumes achieved during Intensive Phase CDT at follow up visits during Management Phase CDT within 3% using LE self-care home program components as directed ( simple self MLD, skin care, ther ex and daily/ nightly compression garments/ devices) .    Baseline  dependent    Time  6    Period  Months    Status  New    Target Date  04/09/18            Plan - 10/16/17 0907    Clinical Impression Statement  Pt had no difficulty tolerating compression wraps over night. Pt denies any leg pain/ discomfort and SOB. Pt able to remove wraps independently to shower this AM. After iin depth training for compression wrapping, Pt able to wrap with max CG  assistance. Pt'ss daughter able to apply wraps using correct techniques with mod A,. Willconsider groin length wraps next week. Commence MLD next visit.    Occupational Profile and client history  currently impacting functional performance  lives with son    as is unable to climb stairs at entrance of daughter's home; plans to return to home in [place] in October 2019; unsure about assistance in [place] as all adults children reside here    Occupational performance deficits (Please refer to evaluation for details):  ADL's;IADL's;Leisure;Social Participation    Rehab Potential  Fair    Current Impairments/barriers affecting progress:  Without max caregiver assistance with LE self-care home program between visits prognosis for improvement is poor    OT Frequency  3x / week    OT Duration  12 weeks   and PRN due to severity of BLE   OT Treatment/Interventions  Self-care/ADL training;Therapeutic exercise;Functional Mobility Training;Manual Therapy;Manual lymph drainage;Therapeutic activities;Coping strategies training;DME and/or AE instruction;Compression bandaging;Other (comment);Patient/family education   fit with custom, BLE compression garments and hours of sleep devices   Plan  CDT includes manual lymphatic drainage (MLD), skin care, compression wrapping and garment fitting, therapeutic exercise and emphasis on Pt and CG self care training    Clinical Decision Making  Multiple treatment options, significant modification of task necessary    Consulted and Agree with Plan of Care  Patient;Family member/caregiver       Patient will benefit from skilled therapeutic intervention in order to improve the following deficits and impairments:  Abnormal gait, Decreased endurance, Decreased skin integrity, Cardiopulmonary status limiting activity, Decreased knowledge of precautions, Impaired perceived functional ability, Decreased activity tolerance, Decreased knowledge of use of DME, Decreased strength,  Decreased balance, Decreased mobility, Difficulty walking, Obesity, Decreased range of motion, Increased edema, Pain  Visit Diagnosis: Lymphedema, not elsewhere classified    Problem List Patient Active Problem List   Diagnosis Date Noted  . AKI (acute kidney injury) ([place])   . Acute CHF (congestive heart failure) ([place]) 08/28/2017  . Hypertensive emergency 08/28/2017  . Obesity, Class III, BMI 40-49.9 (morbid obesity) ([place]) 08/28/2017  . Hypokalemia 08/28/2017  . Acute on chronic combined systolic and diastolic CHF (congestive heart failure) ([place])   . Persistent atrial fibrillation ([place])   . Essential hypertension   . Stage 3 chronic kidney disease ([place])   . Hypercholesterolemia 12/17/2015  . Routine health maintenance 07/23/2015  . 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

## 2017-10-18 ENCOUNTER — Ambulatory Visit: Payer: Medicare Other | Admitting: Occupational Therapy

## 2017-10-18 DIAGNOSIS — I890 Lymphedema, not elsewhere classified: Secondary | ICD-10-CM | POA: Diagnosis not present

## 2017-10-18 NOTE — Therapy (Signed)
[place] MAIN [facility] SERVICES [address] [place], [place], [address] Phone: [phone]   Fax:  [phone]  Occupational Therapy Treatment  Patient Details  Name: [name] Long MRN: [id number] Date of Birth: 23-Mar-1940 Referring Provider: Lowella Petties, MD   Encounter Date: 10/18/2017  OT End of Session - 10/18/17 1434    Visit Number  4    Number of Visits  38    Date for OT Re-Evaluation  01/08/18    OT Start Time  0800    OT Stop Time  0907    OT Time Calculation (min)  67 min    Activity Tolerance  Patient tolerated treatment well;No increased pain    Behavior During Therapy  WFL for tasks assessed/performed       Past Medical History:  Diagnosis Date  . Atrial fibrillation ([place])   . Hypertension     Past Surgical History:  Procedure Laterality Date  . CESAREAN SECTION      There were no vitals filed for this visit.  Subjective Assessment - 10/18/17 0819    Subjective   Pt presents for OT visit 4/36 to address BLE LE. Pt is accompanied by her daughter, [name]. Pt reports she had no difficulty tolerating knee length compression wrap to RLE over night. Pt removed wraps to shower this morning and brings them to clinic this morning.    Patient is accompained by:  Family member    Pertinent History  contributing comorbidities include: acute CHF, persistent Afib, HTN, Stage III CKD, obesity, class III    Limitations  difficulty walking, fall risk, chronic, progressive BLE swelling and    pain,  impaired transfers and functional mobility, decreased activity tolerance, impaired balance, limitted standing tolerance- dependent position exacerbates BLE swelling and discomfort, impaired stair climbing (difficulty lifting legs), BLE weakness, difficulty fitting LB clothing and street shoes due to progressive LE swelling, impaired basic ADLs, LB dressing, bathing, skin and nail care, diabetic skin infection)    Currently in Pain?  Yes   BLE  pain    2/2 LE is unchanged. Not rated   Pain Onset  Other (comment)   onset May 2019                  OT Treatments/Exercises (OP) - 10/18/17 0001      ADLs   ADL Education Given  Yes      Manual Therapy   Manual Therapy  Edema management;Manual Lymphatic Drainage (MLD);Compression Bandaging    Edema Management  skin care below knee to RLE using low ph Eucerin lotion before compression wrapping    Manual Lymphatic Drainage (MLD)  MLD in supine  using short neck sequence. deep abdominals and functional inguinal LN  to RLE.     Compression Bandaging  RLE knee length gradient compressoion wrap from foot to below knee (A-D landmarks). Utilized Rosidal foam in single  overlapping layer (~ 50%) over cotton stockinett, w/ 8,10 and 12 cm short stretch wraps applied in gradient config designed to move fluid congestion from distal to proximal.             OT Education - 10/18/17 1431    Education Details  In depth  ASDL training for gradient compression wrap to knee for entire session    Person(s) Educated  Patient;Child(ren)    Methods  Explanation;Demonstration    Comprehension  Verbalized understanding;Returned demonstration          OT Long Term Goals -  10/11/17 1433      OT LONG TERM GOAL #1   Title  Pt modified independent w/ lymphedema precautions/prevention principals and using printed reference to limit LE progression and infection risk.    Baseline  Max A    Time  2    Period  Weeks    Status  New    Target Date  --   10th OT visit     OT LONG TERM GOAL #2   Title  Lymphedema (LE) management/ self-care: Pt able to apply knee length gradient compression wraps with maximum caregiver assistance daily between OT visits using correct gradient techniques within 2 weeks to achieve optimal limb volume reduction during Intensive Phase of CDT and optimal self-management over time.    Baseline  Dependent    Time  2    Period  Weeks    Status  New    Target Date   --   10th OT visit     OT LONG TERM GOAL #3   Title  Lymphedema (LE) management/ self-care:  Pt to achieve at least 10%  limb volume reduction in BLE  during Intensive Phase CDT to limit LE progression, to decrease infection risk and to improve functional mobility and ambulation essential for optimal ADLs performance.    Baseline  dependent    Time  12    Period  Weeks    Status  New    Target Date  01/08/18      OT LONG TERM GOAL #4   Title  Lymphedema (LE) management/ self-care:  Pt to tolerate daily compression garments and/ or HOS devices in keeping w/ prescribed wear regime within 1 week of issue date to restore normal limb shape to reduce tissue density and protein build up leading to progression.    Baseline  Dependent    Time  12    Period  Weeks    Status  New    Target Date  01/08/18      OT LONG TERM GOAL #5   Title  Lymphedema management/ Self-care: Pt able to don and doff compression garments and/ or devices with maximum assistance using assistive devices and extra times PRN in order to ensure optimal lymphedema management over time to limit progression risk and infection.    Baseline  Dependent    Time  12    Period  Weeks    Status  New    Target Date  01/08/18      Long Term Additional Goals   Additional Long Term Goals  Yes      OT LONG TERM GOAL #6   Title  Lymphedema (LE) management/ self-care:  During Management Phase CDT Pt to sustain reduced limb volumes achieved during Intensive Phase CDT at follow up visits during Management Phase CDT within 3% using LE self-care home program components as directed ( simple self MLD, skin care, ther ex and daily/ nightly compression garments/ devices) .    Baseline  dependent    Time  6    Period  Months    Status  New    Target Date  04/09/18            Plan - 10/18/17 1434    Clinical Impression Statement  Limb   volume below the knee is dramatically decreased since commencing OT for CDT earliwer this week.  Daughter has learned to apply knee lengtjh compression wraps using correct gradient techniques. Pt is tolerating MLD and knee length wraps  without difficulty. Next week we will extend compression wraps to groin to incorporate very swollen thigh .Marland Kitchen Will momitor closely for fluid return overload due to multiple comorbidities impacting rulid retention.    Occupational Profile and client history currently impacting functional performance  lives with son    as is unable to climb stairs at entrance of daughter's home; plans to return to home in [place] in October 2019; unsure about assistance in [place] as all adults children reside here    Occupational performance deficits (Please refer to evaluation for details):  ADL's;IADL's;Leisure;Social Participation    Rehab Potential  Fair    Current Impairments/barriers affecting progress:  Without max caregiver assistance with LE self-care home program between visits prognosis for improvement is poor    OT Frequency  3x / week    OT Duration  12 weeks   and PRN due to severity of BLE   OT Treatment/Interventions  Self-care/ADL training;Therapeutic exercise;Functional Mobility Training;Manual Therapy;Manual lymph drainage;Therapeutic activities;Coping strategies training;DME and/or AE instruction;Compression bandaging;Other (comment);Patient/family education   fit with custom, BLE compression garments and hours of sleep devices   Plan  CDT includes manual lymphatic drainage (MLD), skin care, compression wrapping and garment fitting, therapeutic exercise and emphasis on Pt and CG self care training    Clinical Decision Making  Multiple treatment options, significant modification of task necessary    Consulted and Agree with Plan of Care  Patient;Family member/caregiver       Patient will benefit from skilled therapeutic intervention in order to improve the following deficits and impairments:  Abnormal gait, Decreased endurance, Decreased skin integrity,  Cardiopulmonary status limiting activity, Decreased knowledge of precautions, Impaired perceived functional ability, Decreased activity tolerance, Decreased knowledge of use of DME, Decreased strength, Decreased balance, Decreased mobility, Difficulty walking, Obesity, Decreased range of motion, Increased edema, Pain  Visit Diagnosis: Lymphedema, not elsewhere classified    Problem List Patient Active Problem List   Diagnosis Date Noted  . AKI (acute kidney injury) ([place])   . Acute CHF (congestive heart failure) ([place]) 08/28/2017  . Hypertensive emergency 08/28/2017  . Obesity, Class III, BMI 40-49.9 (morbid obesity) ([place]) 08/28/2017  . Hypokalemia 08/28/2017  . Acute on chronic combined systolic and diastolic CHF (congestive heart failure) ([place])   . Persistent atrial fibrillation ([place])   . Essential hypertension   . Stage 3 chronic kidney disease ([place])   . Hypercholesterolemia 12/17/2015  . Routine health maintenance 07/23/2015  . 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 [place], [place], [address] Phone: [phone]   Fax:  [phone]  Name: Avionna Bower MRN: [id number] Date of Birth: April 10, 1940

## 2017-10-22 ENCOUNTER — Ambulatory Visit: Payer: Medicare Other | Admitting: Occupational Therapy

## 2017-10-22 DIAGNOSIS — I890 Lymphedema, not elsewhere classified: Secondary | ICD-10-CM

## 2017-10-23 NOTE — Therapy (Signed)
[place] MAIN [facility] SERVICES [address] [place], [place], [address] Phone: [phone]   Fax:  [phone]  Occupational Therapy Treatment  Patient Details  Name: [name] Long MRN: [id number] Date of Birth: 1941-02-14 Referring Provider: Lowella Petties, MD   Encounter Date: 10/22/2017  OT End of Session - 10/22/17 1628    Visit Number  5    Number of Visits  38    Date for OT Re-Evaluation  01/08/18    OT Start Time  0806    OT Stop Time  0915    OT Time Calculation (min)  69 min    Activity Tolerance  Patient tolerated treatment well;No increased pain    Behavior During Therapy  WFL for tasks assessed/performed       Past Medical History:  Diagnosis Date  . Atrial fibrillation ([place])   . Hypertension     Past Surgical History:  Procedure Laterality Date  . CESAREAN SECTION      There were no vitals filed for this visit.  Subjective Assessment - 10/23/17 1626    Subjective   Pt presents for OT visit 5/36 to address BLE LE. Pt is accompanied by her daughter, [name]. Pt has no new complaints today. She denies difficulty w/ compression over visit interval.    Patient is accompained by:  Family member    Pertinent History  contributing comorbidities include: acute CHF, persistent Afib, HTN, Stage III CKD, obesity, class III    Limitations  difficulty walking, fall risk, chronic, progressive BLE swelling and    pain,  impaired transfers and functional mobility, decreased activity tolerance, impaired balance, limitted standing tolerance- dependent position exacerbates BLE swelling and discomfort, impaired stair climbing (difficulty lifting legs), BLE weakness, difficulty fitting LB clothing and street shoes due to progressive LE swelling, impaired basic ADLs, LB dressing, bathing, skin and nail care, diabetic skin infection)    Currently in Pain?  Yes   persistent leg pain and discomfort not rated today.   Pain Orientation  Right;Left     Pain Descriptors / Indicators  Heaviness;Tender;Tightness;Tiring;Other (Comment)   fullness, stiffness   Pain Type  Chronic pain    Pain Onset  Other (comment)   onset May 2019                          OT Education - 10/22/17 1628    Education Details  Continued skilled Pt/caregiver education  And LE ADL training throughout visit for lymphedema self care/ home program, including compression wrapping, compression garment and device wear/care, lymphatic pumping ther ex, simple self-MLD, and skin care. Discussed progress towards goals.     Person(s) Educated  Patient;Child(ren)    Methods  Explanation;Demonstration    Comprehension  Verbalized understanding;Returned demonstration          OT Long Term Goals - 10/11/17 1433      OT LONG TERM GOAL #1   Title  Pt modified independent w/ lymphedema precautions/prevention principals and using printed reference to limit LE progression and infection risk.    Baseline  Max A    Time  2    Period  Weeks    Status  New    Target Date  --   10th OT visit     OT LONG TERM GOAL #2   Title  Lymphedema (LE) management/ self-care: Pt able to apply knee length gradient compression wraps with maximum caregiver assistance daily between OT visits  using correct gradient techniques within 2 weeks to achieve optimal limb volume reduction during Intensive Phase of CDT and optimal self-management over time.    Baseline  Dependent    Time  2    Period  Weeks    Status  New    Target Date  --   10th OT visit     OT LONG TERM GOAL #3   Title  Lymphedema (LE) management/ self-care:  Pt to achieve at least 10%  limb volume reduction in BLE  during Intensive Phase CDT to limit LE progression, to decrease infection risk and to improve functional mobility and ambulation essential for optimal ADLs performance.    Baseline  dependent    Time  12    Period  Weeks    Status  New    Target Date  01/08/18      OT LONG TERM GOAL #4    Title  Lymphedema (LE) management/ self-care:  Pt to tolerate daily compression garments and/ or HOS devices in keeping w/ prescribed wear regime within 1 week of issue date to restore normal limb shape to reduce tissue density and protein build up leading to progression.    Baseline  Dependent    Time  12    Period  Weeks    Status  New    Target Date  01/08/18      OT LONG TERM GOAL #5   Title  Lymphedema management/ Self-care: Pt able to don and doff compression garments and/ or devices with maximum assistance using assistive devices and extra times PRN in order to ensure optimal lymphedema management over time to limit progression risk and infection.    Baseline  Dependent    Time  12    Period  Weeks    Status  New    Target Date  01/08/18      Long Term Additional Goals   Additional Long Term Goals  Yes      OT LONG TERM GOAL #6   Title  Lymphedema (LE) management/ self-care:  During Management Phase CDT Pt to sustain reduced limb volumes achieved during Intensive Phase CDT at follow up visits during Management Phase CDT within 3% using LE self-care home program components as directed ( simple self MLD, skin care, ther ex and daily/ nightly compression garments/ devices) .    Baseline  dependent    Time  6    Period  Months    Status  New    Target Date  04/09/18            Plan - 10/23/17 1629    Clinical Impression Statement  Provided MLD as established. Extended RLE cknee length compression wraps to groin today. Pt instructed to remove thigh portion of wraps, or full wrap, if she develops any acute pain in the leg or foot, and/ or any atypical SOB. Good tolerance for CDT today. Cont as per [email or web address].ompression    Occupational Profile and client history currently impacting functional performance  lives with son    as is unable to climb stairs at entrance of daughter's home; plans to return to home in [place] in October 2019; unsure about assistance in [place] as all adults  children reside here    Occupational performance deficits (Please refer to evaluation for details):  ADL's;IADL's;Leisure;Social Participation    Rehab Potential  Fair    Current Impairments/barriers affecting progress:  Without max caregiver assistance with LE self-care home program between visits prognosis  for improvement is poor    OT Frequency  3x / week    OT Duration  12 weeks   and PRN due to severity of BLE   OT Treatment/Interventions  Self-care/ADL training;Therapeutic exercise;Functional Mobility Training;Manual Therapy;Manual lymph drainage;Therapeutic activities;Coping strategies training;DME and/or AE instruction;Compression bandaging;Other (comment);Patient/family education   fit with custom, BLE compression garments and hours of sleep devices   Plan  CDT includes manual lymphatic drainage (MLD), skin care, compression wrapping and garment fitting, therapeutic exercise and emphasis on Pt and CG self care training    Clinical Decision Making  Multiple treatment options, significant modification of task necessary    Consulted and Agree with Plan of Care  Patient;Family member/caregiver       Patient will benefit from skilled therapeutic intervention in order to improve the following deficits and impairments:  Abnormal gait, Decreased endurance, Decreased skin integrity, Cardiopulmonary status limiting activity, Decreased knowledge of precautions, Impaired perceived functional ability, Decreased activity tolerance, Decreased knowledge of use of DME, Decreased strength, Decreased balance, Decreased mobility, Difficulty walking, Obesity, Decreased range of motion, Increased edema, Pain  Visit Diagnosis: Lymphedema, not elsewhere classified    Problem List Patient Active Problem List   Diagnosis Date Noted  . AKI (acute kidney injury) ([place])   . Acute CHF (congestive heart failure) ([place]) 08/28/2017  . Hypertensive emergency 08/28/2017  . Obesity, Class III, BMI 40-49.9 (morbid  obesity) ([place]) 08/28/2017  . Hypokalemia 08/28/2017  . Acute on chronic combined systolic and diastolic CHF (congestive heart failure) ([place])   . Persistent atrial fibrillation ([place])   . Essential hypertension   . Stage 3 chronic kidney disease ([place])   . Hypercholesterolemia 12/17/2015  . Routine health maintenance 07/23/2015  . 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

## 2017-10-24 ENCOUNTER — Ambulatory Visit: Payer: Medicare Other | Admitting: Occupational Therapy

## 2017-10-24 DIAGNOSIS — I890 Lymphedema, not elsewhere classified: Secondary | ICD-10-CM

## 2017-10-24 NOTE — Therapy (Signed)
[place] MAIN [facility] SERVICES [address] [place], [place], [address] Phone: [phone]   Fax:  [phone]  Occupational Therapy Treatment  Patient Details  Name: [name] Long MRN: [id number] Date of Birth: October 08, 1940 Referring Provider: Lowella Petties, MD   Encounter Date: 10/24/2017  OT End of Session - 10/24/17 1123    Visit Number  6    Number of Visits  38    Date for OT Re-Evaluation  01/08/18    OT Start Time  0800    OT Stop Time  0906    OT Time Calculation (min)  66 min    Activity Tolerance  Patient tolerated treatment well;No increased pain    Behavior During Therapy  WFL for tasks assessed/performed       Past Medical History:  Diagnosis Date  . Atrial fibrillation ([place])   . Hypertension     Past Surgical History:  Procedure Laterality Date  . CESAREAN SECTION      There were no vitals filed for this visit.  Subjective Assessment - 10/24/17 0810    Subjective   Pt presents for OT visit 6/36 to address BLE LE. Pt is accompanied by her daughter, [name]. Pt has no new complaints today. She states she tolerated thigh high compression wraps without difficulty over vist interval. Daughter, [name], reports she was able to apply thigh high wraps as instructed without difficulty.  (Pended)     Patient is accompained by:  Family member    Pertinent History  contributing comorbidities include: acute CHF, persistent Afib, HTN, Stage III CKD, obesity, class III    Limitations  difficulty walking, fall risk, chronic, progressive BLE swelling and    pain,  impaired transfers and functional mobility, decreased activity tolerance, impaired balance, limitted standing tolerance- dependent position exacerbates BLE swelling and discomfort, impaired stair climbing (difficulty lifting legs), BLE weakness, difficulty fitting LB clothing and street shoes due to progressive LE swelling, impaired basic ADLs, LB dressing, bathing, skin and nail  care, diabetic skin infection)    Currently in Pain?  Yes  (Pended)    not rated numerically   Pain Location  Leg  (Pended)     Pain Orientation  Left;Right  (Pended)     Pain Descriptors / Indicators  Heaviness;Tightness  (Pended)    fullness   Pain Onset  Other (comment)   onset May 2019                  OT Treatments/Exercises (OP) - 10/24/17 0001      ADLs   ADL Education Given  Yes      Manual Therapy   Manual Therapy  Edema management;Manual Lymphatic Drainage (MLD);Compression Bandaging    Edema Management  skin care  to RLE using low ph castor oil throughout MLD    Manual Lymphatic Drainage (MLD)  MLD in supine  using short neck sequence. deep abdominals and functional inguinal LN  to RLE.     Compression Bandaging  RLE thigh  length gradient compressoion wrap from foot to below knee (A-D landmarks). Utilized Rosidal foam in single  overlapping layer (~ 50%) over cotton stockinett, w/ 8,10 and 12 cm short stretch wraps applied in gradient config designed to move fluid congestion from distal to proximal.             OT Education - 10/24/17 1121    Education Details  Continued skilled Pt/caregiver education  And LE ADL training throughout visit for lymphedema  self care/ home program, including compression wrapping, compression garment and device wear/care, lymphatic pumping ther ex, simple self-MLD, and skin care. Discussed progress towards goals.     Person(s) Educated  Patient;Child(ren)    Methods  Explanation;Demonstration    Comprehension  Verbalized understanding;Returned demonstration          OT Long Term Goals - 10/11/17 1433      OT LONG TERM GOAL #1   Title  Pt modified independent w/ lymphedema precautions/prevention principals and using printed reference to limit LE progression and infection risk.    Baseline  Max A    Time  2    Period  Weeks    Status  New    Target Date  --   10th OT visit     OT LONG TERM GOAL #2   Title   Lymphedema (LE) management/ self-care: Pt able to apply knee length gradient compression wraps with maximum caregiver assistance daily between OT visits using correct gradient techniques within 2 weeks to achieve optimal limb volume reduction during Intensive Phase of CDT and optimal self-management over time.    Baseline  Dependent    Time  2    Period  Weeks    Status  New    Target Date  --   10th OT visit     OT LONG TERM GOAL #3   Title  Lymphedema (LE) management/ self-care:  Pt to achieve at least 10%  limb volume reduction in BLE  during Intensive Phase CDT to limit LE progression, to decrease infection risk and to improve functional mobility and ambulation essential for optimal ADLs performance.    Baseline  dependent    Time  12    Period  Weeks    Status  New    Target Date  01/08/18      OT LONG TERM GOAL #4   Title  Lymphedema (LE) management/ self-care:  Pt to tolerate daily compression garments and/ or HOS devices in keeping w/ prescribed wear regime within 1 week of issue date to restore normal limb shape to reduce tissue density and protein build up leading to progression.    Baseline  Dependent    Time  12    Period  Weeks    Status  New    Target Date  01/08/18      OT LONG TERM GOAL #5   Title  Lymphedema management/ Self-care: Pt able to don and doff compression garments and/ or devices with maximum assistance using assistive devices and extra times PRN in order to ensure optimal lymphedema management over time to limit progression risk and infection.    Baseline  Dependent    Time  12    Period  Weeks    Status  New    Target Date  01/08/18      Long Term Additional Goals   Additional Long Term Goals  Yes      OT LONG TERM GOAL #6   Title  Lymphedema (LE) management/ self-care:  During Management Phase CDT Pt to sustain reduced limb volumes achieved during Intensive Phase CDT at follow up visits during Management Phase CDT within 3% using LE self-care home  program components as directed ( simple self MLD, skin care, ther ex and daily/ nightly compression garments/ devices) .    Baseline  dependent    Time  6    Period  Months    Status  New    Target Date  04/09/18  Plan - 10/24/17 1123    Clinical Impression Statement  Pt presents with dramatic decrease in RLE swelling above knee to groin today. She tolerated full leg compression without any problesms...no SOB, no dizziness, no pain... Daughter was able to reapply compression wraps without difficulty during visit interval. Provided MLD todaty, skin care as established, and re-wraped from toes to groin. Pt demonstrates excellent progress towards goals. Cont as per POC.    Occupational Profile and client history currently impacting functional performance  lives with son    as is unable to climb stairs at entrance of daughter's home; plans to return to home in [place] in October 2019; unsure about assistance in [place] as all adults children reside here    Occupational performance deficits (Please refer to evaluation for details):  ADL's;IADL's;Leisure;Social Participation    Rehab Potential  Fair    Current Impairments/barriers affecting progress:  Without max caregiver assistance with LE self-care home program between visits prognosis for improvement is poor    OT Frequency  3x / week    OT Duration  12 weeks   and PRN due to severity of BLE   OT Treatment/Interventions  Self-care/ADL training;Therapeutic exercise;Functional Mobility Training;Manual Therapy;Manual lymph drainage;Therapeutic activities;Coping strategies training;DME and/or AE instruction;Compression bandaging;Other (comment);Patient/family education   fit with custom, BLE compression garments and hours of sleep devices   Plan  CDT includes manual lymphatic drainage (MLD), skin care, compression wrapping and garment fitting, therapeutic exercise and emphasis on Pt and CG self care training    Clinical Decision  Making  Multiple treatment options, significant modification of task necessary    Consulted and Agree with Plan of Care  Patient;Family member/caregiver       Patient will benefit from skilled therapeutic intervention in order to improve the following deficits and impairments:  Abnormal gait, Decreased endurance, Decreased skin integrity, Cardiopulmonary status limiting activity, Decreased knowledge of precautions, Impaired perceived functional ability, Decreased activity tolerance, Decreased knowledge of use of DME, Decreased strength, Decreased balance, Decreased mobility, Difficulty walking, Obesity, Decreased range of motion, Increased edema, Pain  Visit Diagnosis: Lymphedema, not elsewhere classified    Problem List Patient Active Problem List   Diagnosis Date Noted  . AKI (acute kidney injury) ([place])   . Acute CHF (congestive heart failure) ([place]) 08/28/2017  . Hypertensive emergency 08/28/2017  . Obesity, Class III, BMI 40-49.9 (morbid obesity) ([place]) 08/28/2017  . Hypokalemia 08/28/2017  . Acute on chronic combined systolic and diastolic CHF (congestive heart failure) ([place])   . Persistent atrial fibrillation ([place])   . Essential hypertension   . Stage 3 chronic kidney disease ([place])   . Hypercholesterolemia 12/17/2015  . Routine health maintenance 07/23/2015  . 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 [place], [place], [address] Phone: [phone]   Fax:  [phone]  Name: [name] Long MRN: [id number] Date of Birth: 07-25-1940

## 2017-10-25 ENCOUNTER — Ambulatory Visit: Payer: Medicare Other | Admitting: Occupational Therapy

## 2017-10-25 DIAGNOSIS — I890 Lymphedema, not elsewhere classified: Secondary | ICD-10-CM | POA: Diagnosis not present

## 2017-10-25 NOTE — Therapy (Signed)
[place] MAIN [facility] SERVICES [address] [place], [place], [address] Phone: [phone]   Fax:  [phone]  Occupational Therapy Treatment  Patient Details  Name: [name] Long MRN: [id number] Date of Birth: 1940-06-02 Referring Provider: Lowella Petties, MD   Encounter Date: 10/25/2017  OT End of Session - 10/25/17 0939    Visit Number  6    Number of Visits  38    Date for OT Re-Evaluation  01/08/18    OT Start Time  0800    OT Stop Time  0911    OT Time Calculation (min)  71 min    Activity Tolerance  Patient tolerated treatment well;No increased pain    Behavior During Therapy  WFL for tasks assessed/performed       Past Medical History:  Diagnosis Date  . Atrial fibrillation ([place])   . Hypertension     Past Surgical History:  Procedure Laterality Date  . CESAREAN SECTION      There were no vitals filed for this visit.  Subjective Assessment - 10/25/17 0928    Subjective   Pt presents for OT visit 7/36 to address BLE LE. Pt is accompanied by her daughter, [name]. Pt states she is tolerating compression wraps well between visits. She denies pain today.    Patient is accompained by:  Family member    Pertinent History  contributing comorbidities include: acute CHF, persistent Afib, HTN, Stage III CKD, obesity, class III    Limitations  difficulty walking, fall risk, chronic, progressive BLE swelling and    pain,  impaired transfers and functional mobility, decreased activity tolerance, impaired balance, limitted standing tolerance- dependent position exacerbates BLE swelling and discomfort, impaired stair climbing (difficulty lifting legs), BLE weakness, difficulty fitting LB clothing and street shoes due to progressive LE swelling, impaired basic ADLs, LB dressing, bathing, skin and nail care, diabetic skin infection)    Currently in Pain?  No/denies    Pain Onset  Other (comment)   onset May 2019          LYMPHEDEMA/ONCOLOGY QUESTIONNAIRE - 10/25/17 0930      Right Lower Extremity Lymphedema   Other  RLE ankle to below knee (A-D) limb volume = 4943.49. ml. RLE knee to groin (E-G) volume = 7561.55 ml. RLE full leg (A-G) volume = 12505.04 ML.    Other  RLE limb volume reduction  from ankle to tibial tuberosity (A-D) measures 20.91% since commencing OT for CDT on 10/15/2017. . This value meets and exceeds the 10% limb volume reduction goal for the leg segment. RLE knee- groin (E-G) volume reduction measures 32.45% since commencing OT. GOAL MET x 3. The RLE full leg (A-G) limb volume reduction measures 28.32%. GOAL MET       Left Lower Extremity Lymphedema   Other  --              OT Treatments/Exercises (OP) - 10/25/17 0001      ADLs   ADL Education Given  Yes      Manual Therapy   Manual Therapy  Edema management;Manual Lymphatic Drainage (MLD);Compression Bandaging    Manual therapy comments  completed RLE comparative limb volumetrics to measure progress towards goals    Edema Management  skin care  to RLE using low ph castor oil throughout MLD    Manual Lymphatic Drainage (MLD)  MLD in supine  using short neck sequence. deep abdominals and functional inguinal LN  to RLE.     Compression  Bandaging  RLE thigh  length gradient compressoion wrap from foot to below knee (A-D landmarks). Utilized Rosidal foam in single  overlapping layer (~ 50%) over cotton stockinett, w/ 8,10 and 12 cm short stretch wraps applied in gradient config designed to move fluid congestion from distal to proximal.             OT Education - 10/25/17 0930    Education Details  Continued skilled Pt/caregiver education  And LE ADL training throughout visit for lymphedema self care/ home program, including compression wrapping, compression garment and device wear/care, lymphatic pumping ther ex, simple self-MLD, and skin care. Discussed progress towards goals.     Person(s) Educated   Patient;Child(ren)    Methods  Explanation;Demonstration    Comprehension  Verbalized understanding;Returned demonstration          OT Long Term Goals - 10/25/17 0941      OT LONG TERM GOAL #1   Title  Pt modified independent w/ lymphedema precautions/prevention principals and using printed reference to limit LE progression and infection risk.    Baseline  Max A Met 10/25/17    Time  2    Period  Weeks    Status  Achieved      OT LONG TERM GOAL #2   Title  Lymphedema (LE) management/ self-care: Pt able to apply knee length gradient compression wraps with maximum caregiver assistance daily between OT visits using correct gradient techniques within 2 weeks to achieve optimal limb volume reduction during Intensive Phase of CDT and optimal self-management over time.    Baseline  Dependent Goal MET 10/25/17 for full length compression wraps. CG is independent and uses excellent technique.    Time  2    Period  Weeks    Status  Achieved      OT LONG TERM GOAL #3   Title  Lymphedema (LE) management/ self-care:  Pt to achieve at least 10%  limb volume reduction in BLE  during Intensive Phase CDT to limit LE progression, to decrease infection risk and to improve functional mobility and ambulation essential for optimal ADLs performance.    Baseline  dependent  10/25/17 Goal met ansd exceeded for RLE. see TREATMENT and PLAN for details    Time  12    Period  Weeks    Status  Partially Met      OT LONG TERM GOAL #4   Title  Lymphedema (LE) management/ self-care:  Pt to tolerate daily compression garments and/ or HOS devices in keeping w/ prescribed wear regime within 1 week of issue date to restore normal limb shape to reduce tissue density and protein build up leading to progression.    Baseline  Dependent    Time  12    Period  Weeks    Status  New      OT LONG TERM GOAL #5   Title  Lymphedema management/ Self-care: Pt able to don and doff compression garments and/ or devices with maximum  assistance using assistive devices and extra times PRN in order to ensure optimal lymphedema management over time to limit progression risk and infection.    Baseline  Dependent    Time  12    Period  Weeks    Status  New      OT LONG TERM GOAL #6   Title  Lymphedema (LE) management/ self-care:  During Management Phase CDT Pt to sustain reduced limb volumes achieved during Intensive Phase CDT at follow up visits during Management  Phase CDT within 3% using LE self-care home program components as directed ( simple self MLD, skin care, ther ex and daily/ nightly compression garments/ devices) .    Baseline  dependent    Time  6    Period  Months    Status  New            Plan - 10/25/17 0940    Clinical Impression Statement  Completed RLE comparative limb volumetrics today to measure progress towards that goal. RLE limb volume reduction  from ankle to tibial tuberosity (A-D) measures 20.91% since commencing OT for CDT on 10/15/2017. . This value meets and exceeds the 10% limb volume reduction goal for the leg segment. RLE knee- groin (E-G) volume reduction measures 32.45% since commencing OT. GOAL MET x 3. The RLE full leg (A-G) limb volume reduction measures 28.32%. GOAL MET . Applied compression wraps after brief MLD session. Pt demonstrates excellent progress towards all goals           to date.    Occupational Profile and client history currently impacting functional performance  lives with son    as is unable to climb stairs at entrance of daughter's home; plans to return to home in [place] in October 2019; unsure about assistance in [place] as all adults children reside here    Occupational performance deficits (Please refer to evaluation for details):  ADL's;IADL's;Leisure;Social Participation    Rehab Potential  Fair    Current Impairments/barriers affecting progress:  Without max caregiver assistance with LE self-care home program between visits prognosis for improvement is poor     OT Frequency  3x / week    OT Duration  12 weeks   and PRN due to severity of BLE   OT Treatment/Interventions  Self-care/ADL training;Therapeutic exercise;Functional Mobility Training;Manual Therapy;Manual lymph drainage;Therapeutic activities;Coping strategies training;DME and/or AE instruction;Compression bandaging;Other (comment);Patient/family education   fit with custom, BLE compression garments and hours of sleep devices   Plan  CDT includes manual lymphatic drainage (MLD), skin care, compression wrapping and garment fitting, therapeutic exercise and emphasis on Pt and CG self care training    Clinical Decision Making  Multiple treatment options, significant modification of task necessary    Consulted and Agree with Plan of Care  Patient;Family member/caregiver       Patient will benefit from skilled therapeutic intervention in order to improve the following deficits and impairments:  Abnormal gait, Decreased endurance, Decreased skin integrity, Cardiopulmonary status limiting activity, Decreased knowledge of precautions, Impaired perceived functional ability, Decreased activity tolerance, Decreased knowledge of use of DME, Decreased strength, Decreased balance, Decreased mobility, Difficulty walking, Obesity, Decreased range of motion, Increased edema, Pain  Visit Diagnosis: Lymphedema, not elsewhere classified    Problem List Patient Active Problem List   Diagnosis Date Noted  . AKI (acute kidney injury) ([place])   . Acute CHF (congestive heart failure) ([place]) 08/28/2017  . Hypertensive emergency 08/28/2017  . Obesity, Class III, BMI 40-49.9 (morbid obesity) ([place]) 08/28/2017  . Hypokalemia 08/28/2017  . Acute on chronic combined systolic and diastolic CHF (congestive heart failure) ([place])   . Persistent atrial fibrillation ([name])   . Essential hypertension   . Stage 3 chronic kidney disease ([place])   . Hypercholesterolemia 12/17/2015  . Routine health maintenance 07/23/2015  . MGUS  (monoclonal gammopathy of unknown significance) 07/31/2014    Andrey Spearman, MS, OTR/L, [facility] 10/25/17 9:44 AM  [place] MAIN [facility] SERVICES [place], [place],  [place] Phone: [phone]   Fax:  [phone]  Name: [name] Long MRN: [id number] Date of Birth: 05/24/1940

## 2017-10-29 ENCOUNTER — Ambulatory Visit: Payer: Medicare Other | Admitting: Occupational Therapy

## 2017-10-29 DIAGNOSIS — I890 Lymphedema, not elsewhere classified: Secondary | ICD-10-CM

## 2017-10-29 NOTE — Therapy (Signed)
[place] MAIN [facility] SERVICES [address] [place], [place], [address] Phone: [phone]   Fax:  [phone]  Occupational Therapy Treatment  Patient Details  Name: [name] Long MRN: [id number] Date of Birth: 1940/04/09 Referring Provider: Lowella Petties, MD   Encounter Date: 10/29/2017  OT End of Session - 10/29/17 0815    Visit Number  8    Number of Visits  36    Date for OT Re-Evaluation  01/08/18    OT Start Time  0803    OT Stop Time  0911    OT Time Calculation (min)  68 min    Activity Tolerance  Patient tolerated treatment well;No increased pain    Behavior During Therapy  WFL for tasks assessed/performed       Past Medical History:  Diagnosis Date  . Atrial fibrillation ([place])   . Hypertension     Past Surgical History:  Procedure Laterality Date  . CESAREAN SECTION      There were no vitals filed for this visit.  Subjective Assessment - 10/29/17 1046    Subjective   Pt presents for OT visit 7/36 to address BLE LE. Pt is accompanied by her daughter, [name] Long.Pt denies difficulty tolerating compression during 3 day OT interval.    Patient is accompained by:  Family member    Pertinent History  contributing comorbidities include: acute CHF, persistent Afib, HTN, Stage III CKD, obesity, class III    Limitations  difficulty walking, fall risk, chronic, progressive BLE swelling and    pain,  impaired transfers and functional mobility, decreased activity tolerance, impaired balance, limitted standing tolerance- dependent position exacerbates BLE swelling and discomfort, impaired stair climbing (difficulty lifting legs), BLE weakness, difficulty fitting LB clothing and street shoes due to progressive LE swelling, impaired basic ADLs, LB dressing, bathing, skin and nail care, diabetic skin infection)    Currently in Pain?  No/denies    Pain Onset  Other (comment)   onset May 2019                  OT  Treatments/Exercises (OP) - 10/29/17 0001      ADLs   ADL Education Given  Yes      Manual Therapy   Manual Therapy  Edema management;Manual Lymphatic Drainage (MLD);Compression Bandaging    Edema Management  skin care  to RLE using low ph castor oil throughout MLD    Manual Lymphatic Drainage (MLD)  MLD in supine  using short neck sequence. deep abdominals and functional inguinal LN  to RLE.     Compression Bandaging  RLE thigh  length gradient compressoion wrap from foot to below knee (A-D landmarks). Utilized Rosidal foam in single  overlapping layer (~ 50%) over cotton stockinett, w/ 8,10 and 12 cm short stretch wraps applied in gradient config designed to move fluid congestion from distal to proximal.             OT Education - 10/29/17 1048    Education Details  Emphasis on Pt and CG edu for LE se;f care on differences between custom flat knit and OTS circular knit compression garments, explanation of compression classes and containmenrt, and clinical rational for recommendations to meet this Pt's needs- custom flat knit, ccl 2 or above. discussed  assistive devices and level of assistance needed for donning and doffing. Showed examples    Person(s) Educated  Patient;Child(ren)    Methods  Explanation;Demonstration    Comprehension  Verbalized understanding;Returned demonstration  OT Long Term Goals - 10/25/17 0941      OT LONG TERM GOAL #1   Title  Pt modified independent w/ lymphedema precautions/prevention principals and using printed reference to limit LE progression and infection risk.    Baseline  Max A Met 10/25/17    Time  2    Period  Weeks    Status  Achieved      OT LONG TERM GOAL #2   Title  Lymphedema (LE) management/ self-care: Pt able to apply knee length gradient compression wraps with maximum caregiver assistance daily between OT visits using correct gradient techniques within 2 weeks to achieve optimal limb volume reduction during Intensive Phase  of CDT and optimal self-management over time.    Baseline  Dependent Goal MET 10/25/17 for full length compression wraps. CG is independent and uses excellent technique.    Time  2    Period  Weeks    Status  Achieved      OT LONG TERM GOAL #3   Title  Lymphedema (LE) management/ self-care:  Pt to achieve at least 10%  limb volume reduction in BLE  during Intensive Phase CDT to limit LE progression, to decrease infection risk and to improve functional mobility and ambulation essential for optimal ADLs performance.    Baseline  dependent  10/25/17 Goal met ansd exceeded for RLE. see TREATMENT and PLAN for details    Time  12    Period  Weeks    Status  Partially Met      OT LONG TERM GOAL #4   Title  Lymphedema (LE) management/ self-care:  Pt to tolerate daily compression garments and/ or HOS devices in keeping w/ prescribed wear regime within 1 week of issue date to restore normal limb shape to reduce tissue density and protein build up leading to progression.    Baseline  Dependent    Time  12    Period  Weeks    Status  New      OT LONG TERM GOAL #5   Title  Lymphedema management/ Self-care: Pt able to don and doff compression garments and/ or devices with maximum assistance using assistive devices and extra times PRN in order to ensure optimal lymphedema management over time to limit progression risk and infection.    Baseline  Dependent    Time  12    Period  Weeks    Status  New      OT LONG TERM GOAL #6   Title  Lymphedema (LE) management/ self-care:  During Management Phase CDT Pt to sustain reduced limb volumes achieved during Intensive Phase CDT at follow up visits during Management Phase CDT within 3% using LE self-care home program components as directed ( simple self MLD, skin care, ther ex and daily/ nightly compression garments/ devices) .    Baseline  dependent    Time  6    Period  Months    Status  New            Plan - 10/29/17 1052    Clinical Impression  Statement  Additional RLE limb volume visible today for entire RLE since last visit. Swelling reduction appears to be slowing somehat, but continues . Pt tolerated compression over weekend and CG is diligent with consistent assistance w/ wraps. Provided Pt and CG edu re compression garment options and recommendations today. OT leaning towards flat knir custom thigh high needed for optimal fit and comfort w/ needed containment. Concerns include  keeping  top edge of garment in place without rolling or falling down  and bunching, and Pt's ability to don and doff. Cont as per POC.    Occupational Profile and client history currently impacting functional performance  lives with son    as is unable to climb stairs at entrance of daughter's home; plans to return to home in [place] in October 2019; unsure about assistance in [employer] as all adults children reside here    Occupational performance deficits (Please refer to evaluation for details):  ADL's;IADL's;Leisure;Social Participation    Rehab Potential  Fair    Current Impairments/barriers affecting progress:  Without max caregiver assistance with LE self-care home program between visits prognosis for improvement is poor    OT Frequency  3x / week    OT Duration  12 weeks   and PRN due to severity of BLE   OT Treatment/Interventions  Self-care/ADL training;Therapeutic exercise;Functional Mobility Training;Manual Therapy;Manual lymph drainage;Therapeutic activities;Coping strategies training;DME and/or AE instruction;Compression bandaging;Other (comment);Patient/family education   fit with custom, BLE compression garments and hours of sleep devices   Plan  CDT includes manual lymphatic drainage (MLD), skin care, compression wrapping and garment fitting, therapeutic exercise and emphasis on Pt and CG self care training    Clinical Decision Making  Multiple treatment options, significant modification of task necessary    Consulted and Agree with Plan of Care   Patient;Family member/caregiver       Patient will benefit from skilled therapeutic intervention in order to improve the following deficits and impairments:  Abnormal gait, Decreased endurance, Decreased skin integrity, Cardiopulmonary status limiting activity, Decreased knowledge of precautions, Impaired perceived functional ability, Decreased activity tolerance, Decreased knowledge of use of DME, Decreased strength, Decreased balance, Decreased mobility, Difficulty walking, Obesity, Decreased range of motion, Increased edema, Pain  Visit Diagnosis: Lymphedema, not elsewhere classified    Problem List Patient Active Problem List   Diagnosis Date Noted  . AKI (acute kidney injury) ([place])   . Acute CHF (congestive heart failure) ([place]) 08/28/2017  . Hypertensive emergency 08/28/2017  . Obesity, Class III, BMI 40-49.9 (morbid obesity) ([place]) 08/28/2017  . Hypokalemia 08/28/2017  . Acute on chronic combined systolic and diastolic CHF (congestive heart failure) ([place])   . Persistent atrial fibrillation ([place])   . Essential hypertension   . Stage 3 chronic kidney disease ([place])   . Hypercholesterolemia 12/17/2015  . Routine health maintenance 07/23/2015  . 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 [place], [place], [address] Phone: [phone]   Fax:  [phone]  Name: [name] Long MRN: [id number] Date of Birth: Jul 30, 1940

## 2017-10-31 ENCOUNTER — Ambulatory Visit: Payer: Medicare Other | Admitting: Occupational Therapy

## 2017-10-31 DIAGNOSIS — I890 Lymphedema, not elsewhere classified: Secondary | ICD-10-CM | POA: Diagnosis not present

## 2017-10-31 NOTE — Therapy (Signed)
[place] MAIN [facility] SERVICES [address] [place], [place], [address] Phone: [phone]   Fax:  [phone]  Occupational Therapy Treatment  Patient Details  Name: [name] Long MRN: [id number] Date of Birth: April 14, 1940 Referring Provider: Lowella Petties, MD   Encounter Date: 10/31/2017  OT End of Session - 10/31/17 0809    Visit Number  9    Number of Visits  36    Date for OT Re-Evaluation  01/08/18    OT Start Time  0800    OT Stop Time  0900    OT Time Calculation (min)  60 min    Activity Tolerance  Patient tolerated treatment well;No increased pain    Behavior During Therapy  WFL for tasks assessed/performed       Past Medical History:  Diagnosis Date  . Atrial fibrillation ([place])   . Hypertension     Past Surgical History:  Procedure Laterality Date  . CESAREAN SECTION      There were no vitals filed for this visit.  Subjective Assessment - 10/31/17 0810    Subjective   Pt presents for OT visit 9/36 to address BLE LE. Pt is accompanied by her daughter, [name]. Pt reports that her R heel was soor 2 nights ago and it impaired her sleep. "But today it feels fine"    Patient is accompained by:  Family member    Pertinent History  contributing comorbidities include: acute CHF, persistent Afib, HTN, Stage III CKD, obesity, class III    Limitations  difficulty walking, fall risk, chronic, progressive BLE swelling and    pain,  impaired transfers and functional mobility, decreased activity tolerance, impaired balance, limitted standing tolerance- dependent position exacerbates BLE swelling and discomfort, impaired stair climbing (difficulty lifting legs), BLE weakness, difficulty fitting LB clothing and street shoes due to progressive LE swelling, impaired basic ADLs, LB dressing, bathing, skin and nail care, diabetic skin infection)    Pain Onset  Other (comment)   onset May 2019                  OT  Treatments/Exercises (OP) - 10/31/17 0001      ADLs   ADL Education Given  Yes      Manual Therapy   Manual Therapy  Edema management;Manual Lymphatic Drainage (MLD);Compression Bandaging    Edema Management  skin care  to RLE using low ph castor oil throughout MLD    Manual Lymphatic Drainage (MLD)  MLD in supine  using short neck sequence. deep abdominals and functional inguinal LN  to RLE.     Compression Bandaging  RLE thigh  length gradient compressoion wrap from foot to below knee (A-D landmarks). Utilized Rosidal foam in single  overlapping layer (~ 50%) over cotton stockinett, w/ 8,10 and 12 cm short stretch wraps applied in gradient config designed to move fluid congestion from distal to proximal.             OT Education - 10/31/17 0945    Education Details  Continued skilled Pt/caregiver education  And LE ADL training throughout visit for lymphedema self care/ home program, including compression wrapping, compression garment and device wear/care, lymphatic pumping ther ex, simple self-MLD, and skin care. Discussed progress towards goals.     Person(s) Educated  Patient;Child(ren)    Methods  Explanation;Demonstration    Comprehension  Verbalized understanding;Returned demonstration          OT Long Term Goals - 10/31/17 2549  OT LONG TERM GOAL #1   Title  Pt modified independent w/ lymphedema precautions/prevention principals and using printed reference to limit LE progression and infection risk.  (Pended)     Baseline  Max A Met 10/25/17  (Pended)     Time  2  (Pended)     Period  Weeks  (Pended)     Status  Achieved  (Pended)       OT LONG TERM GOAL #2   Title  Lymphedema (LE) management/ self-care: Pt able to apply knee length gradient compression wraps with maximum caregiver assistance daily between OT visits using correct gradient techniques within 2 weeks to achieve optimal limb volume reduction during Intensive Phase of CDT and optimal self-management over  time.  (Pended)     Baseline  Dependent Goal MET 10/25/17 for full length compression wraps. CG is independent and uses excellent technique.  (Pended)     Time  2  (Pended)     Period  Weeks  (Pended)     Status  Achieved  (Pended)       OT LONG TERM GOAL #3   Title  Lymphedema (LE) management/ self-care:  Pt to achieve at least 10%  limb volume reduction in BLE  during Intensive Phase CDT to limit LE progression, to decrease infection risk and to improve functional mobility and ambulation essential for optimal ADLs performance.  (Pended)     Baseline  dependent  10/25/17 Goal met ansd exceeded for RLE. see TREATMENT and PLAN for details  (Pended)     Time  12  (Pended)     Period  Weeks  (Pended)     Status  Partially Met  (Pended)       OT LONG TERM GOAL #4   Title  Lymphedema (LE) management/ self-care:  Pt to tolerate daily compression garments and/ or HOS devices in keeping w/ prescribed wear regime within 1 week of issue date to restore normal limb shape to reduce tissue density and protein build up leading to progression.  (Pended)     Baseline  Dependent  (Pended)     Time  12  (Pended)     Period  Weeks  (Pended)     Status  New  (Pended)       OT LONG TERM GOAL #5   Title  Lymphedema management/ Self-care: Pt able to don and doff compression garments and/ or devices with maximum assistance using assistive devices and extra times PRN in order to ensure optimal lymphedema management over time to limit progression risk and infection.  (Pended)     Baseline  Dependent  (Pended)     Time  12  (Pended)     Period  Weeks  (Pended)     Status  New  (Pended)       OT LONG TERM GOAL #6   Title  Lymphedema (LE) management/ self-care:  During Management Phase CDT Pt to sustain reduced limb volumes achieved during Intensive Phase CDT at follow up visits during Management Phase CDT within 3% using LE self-care home program components as directed ( simple self MLD, skin care, ther ex and daily/  nightly compression garments/ devices) .  (Pended)     Baseline  dependent  (Pended)     Time  6  (Pended)     Period  Months  (Pended)     Status  New  (Pended)             Plan -  10/31/17 0946    Clinical Impression Statement  Provided MLD, skin care, Pt edu for LE self care and compression therapy. Pt tolerated all aspects of OT for LE care today without difficulty. Limb volume continues to reduce throughout RLE. Cont as per POC. Fit for RLE custom compression garment ASAP.    Occupational Profile and client history currently impacting functional performance  lives with son    as is unable to climb stairs at entrance of daughter's home; plans to return to home in [place] in October 2019; unsure about assistance in [place] as all adults children reside here    Occupational performance deficits (Please refer to evaluation for details):  ADL's;IADL's;Leisure;Social Participation    Rehab Potential  Fair    Current Impairments/barriers affecting progress:  Without max caregiver assistance with LE self-care home program between visits prognosis for improvement is poor    OT Frequency  3x / week    OT Duration  12 weeks   and PRN due to severity of BLE   OT Treatment/Interventions  Self-care/ADL training;Therapeutic exercise;Functional Mobility Training;Manual Therapy;Manual lymph drainage;Therapeutic activities;Coping strategies training;DME and/or AE instruction;Compression bandaging;Other (comment);Patient/family education   fit with custom, BLE compression garments and hours of sleep devices   Plan  CDT includes manual lymphatic drainage (MLD), skin care, compression wrapping and garment fitting, therapeutic exercise and emphasis on Pt and CG self care training    Clinical Decision Making  Multiple treatment options, significant modification of task necessary    Consulted and Agree with Plan of Care  Patient;Family member/caregiver       Patient will benefit from skilled therapeutic  intervention in order to improve the following deficits and impairments:  Abnormal gait, Decreased endurance, Decreased skin integrity, Cardiopulmonary status limiting activity, Decreased knowledge of precautions, Impaired perceived functional ability, Decreased activity tolerance, Decreased knowledge of use of DME, Decreased strength, Decreased balance, Decreased mobility, Difficulty walking, Obesity, Decreased range of motion, Increased edema, Pain  Visit Diagnosis: Lymphedema, not elsewhere classified    Problem List Patient Active Problem List   Diagnosis Date Noted  . AKI (acute kidney injury) ([place])   . Acute CHF (congestive heart failure) ([place]) 08/28/2017  . Hypertensive emergency 08/28/2017  . Obesity, Class III, BMI 40-49.9 (morbid obesity) ([place]) 08/28/2017  . Hypokalemia 08/28/2017  . Acute on chronic combined systolic and diastolic CHF (congestive heart failure) ([place])   . Persistent atrial fibrillation ([place])   . Essential hypertension   . Stage 3 chronic kidney disease ([place])   . Hypercholesterolemia 12/17/2015  . Routine health maintenance 07/23/2015  . 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 [place], [place], [address] Phone: [phone]   Fax:  [phone]  Name: [name] Long MRN: [id number] Date of Birth: Apr 11, 1940

## 2017-11-01 ENCOUNTER — Ambulatory Visit: Payer: Medicare Other | Admitting: Occupational Therapy

## 2017-11-01 DIAGNOSIS — I890 Lymphedema, not elsewhere classified: Secondary | ICD-10-CM | POA: Diagnosis not present

## 2017-11-01 NOTE — Therapy (Signed)
[place] MAIN [facility] SERVICES [address] [place], [place], [address] Phone: [phone]   Fax:  [phone]  Occupational Therapy Treatment Note and Progress Report  Patient Details  Name: [name] Long MRN: [id number] Date of Birth: [birth year] Referring Provider: Lowella Petties, MD   Encounter Date: 11/01/2017  OT End of Session - 11/01/17 0952    Visit Number  10    Number of Visits  36    Date for OT Re-Evaluation  01/08/18    OT Start Time  0810    OT Stop Time  0904    OT Time Calculation (min)  54 min    Activity Tolerance  Patient tolerated treatment well;No increased pain    Behavior During Therapy  WFL for tasks assessed/performed       Past Medical History:  Diagnosis Date  . Atrial fibrillation ([place])   . Hypertension     Past Surgical History:  Procedure Laterality Date  . CESAREAN SECTION      There were no vitals filed for this visit.  Subjective Assessment - 11/01/17 0951    Subjective   Pt presents for OT visit 10/36 to address BLE LE. Pt is accompanied by her daughter, [name]. Pt has no new complaints today. Pt states she slept very well last night. She states she is pleased with her progress to date.    Patient is accompained by:  Family member    Pertinent History  contributing comorbidities include: acute CHF, persistent Afib, HTN, Stage III CKD, obesity, class III    Limitations  difficulty walking, fall risk, chronic, progressive BLE swelling and    pain,  impaired transfers and functional mobility, decreased activity tolerance, impaired balance, limitted standing tolerance- dependent position exacerbates BLE swelling and discomfort, impaired stair climbing (difficulty lifting legs), BLE weakness, difficulty fitting LB clothing and street shoes due to progressive LE swelling, impaired basic ADLs, LB dressing, bathing, skin and nail care, diabetic skin infection)    Currently in Pain?  No/denies    Pain Onset   Other (comment)   onset May 2019                  OT Treatments/Exercises (OP) - 11/01/17 0001      ADLs   ADL Education Given  Yes      Manual Therapy   Manual Therapy  Edema management;Manual Lymphatic Drainage (MLD);Compression Bandaging    Edema Management  skin care  to RLE using low ph castor oil throughout MLD    Manual Lymphatic Drainage (MLD)  MLD in supine  using short neck sequence. deep abdominals and functional inguinal LN  to RLE.     Compression Bandaging  RLE thigh  length gradient compressoion wrap from foot to below knee (A-D landmarks). Utilized Rosidal foam in single  overlapping layer (~ 50%) over cotton stockinett, w/ 8,10 and 12 cm short stretch wraps applied in gradient config designed to move fluid congestion from distal to proximal.             OT Education - 11/01/17 0952    Education Details  Continued skilled Pt/caregiver education  And LE ADL training throughout visit for lymphedema self care/ home program, including compression wrapping, compression garment and device wear/care, lymphatic pumping ther ex, simple self-MLD, and skin care. Discussed progress towards goals.     Person(s) Educated  Patient;Child(ren)    Methods  Explanation;Demonstration    Comprehension  Verbalized understanding;Returned demonstration  OT Long Term Goals - 11/01/17 0953      OT LONG TERM GOAL #1   Title  Pt modified independent w/ lymphedema precautions/prevention principals and using printed reference to limit LE progression and infection risk.    Baseline  Max A Met 10/25/17    Time  2    Period  Weeks    Status  Achieved      OT LONG TERM GOAL #2   Title  Lymphedema (LE) management/ self-care: Pt able to apply knee length gradient compression wraps with maximum caregiver assistance daily between OT visits using correct gradient techniques within 2 weeks to achieve optimal limb volume reduction during Intensive Phase of CDT and optimal  self-management over time.    Baseline  Dependent Goal MET 11/01/17 for full length compression wraps. CG is independent and uses excellent technique.    Time  2    Period  Weeks    Status  Achieved      OT LONG TERM GOAL #3   Title  Lymphedema (LE) management/ self-care:  Pt to achieve at least 10%  limb volume reduction in BLE  during Intensive Phase CDT to limit LE progression, to decrease infection risk and to improve functional mobility and ambulation essential for optimal ADLs performance.    Baseline  dependent  11/01/17 Goal met and exceeded for RLE.     Time  12    Period  Weeks    Status  Partially Met      OT LONG TERM GOAL #4   Title  Lymphedema (LE) management/ self-care:  Pt to tolerate daily compression garments and/ or HOS devices in keeping w/ prescribed wear regime within 1 week of issue date to restore normal limb shape to reduce tissue density and protein build up leading to progression.    Baseline  Dependent    Time  12    Period  Weeks    Status  New      OT LONG TERM GOAL #5   Title  Lymphedema management/ Self-care: Pt able to don and doff compression garments and/ or devices with maximum assistance using assistive devices and extra times PRN in order to ensure optimal lymphedema management over time to limit progression risk and infection.    Baseline  Dependent    Time  12    Period  Weeks    Status  New      OT LONG TERM GOAL #6   Title  Lymphedema (LE) management/ self-care:  During Management Phase CDT Pt to sustain reduced limb volumes achieved during Intensive Phase CDT at follow up visits during Management Phase CDT within 3% using LE self-care home program components as directed ( simple self MLD, skin care, ther ex and daily/ nightly compression garments/ devices) .    Baseline  dependent    Time  6    Period  Months    Status  New            Plan - 11/01/17 0955    Clinical Impression Statement  Pt continues to make steady progress towards  all OT goals for CDT. Pt is mod I with LE precautions. She has met and exceeded 10% limb volume reduction goal for RLE with >30% reduction. Caregiver is independent and consistent with compression   between visits and Pt is independent an consistent with LE ther ex. Skin conditin is markedly improved. Pt is tolerating compression without difficulty. Compression garments have not yet been ordered as  limb volume reduction has not yet  plateaued. Pt tolerated all aspects of CDT to RLE today, including MLD, skin care and compression wrapping. he is gaining independence with simple self MLD mastering J stroke and neck sequence thus far. Cont as per POC.    Occupational Profile and client history currently impacting functional performance  lives with son    as is unable to climb stairs at entrance of daughter's home; plans to return to home in [place] in October 2019; unsure about assistance in [place] as all adults children reside here    Occupational performance deficits (Please refer to evaluation for details):  ADL's;IADL's;Leisure;Social Participation    Rehab Potential  Fair    Current Impairments/barriers affecting progress:  Without max caregiver assistance with LE self-care home program between visits prognosis for improvement is poor    OT Frequency  3x / week    OT Duration  12 weeks   and PRN due to severity of BLE   OT Treatment/Interventions  Self-care/ADL training;Therapeutic exercise;Functional Mobility Training;Manual Therapy;Manual lymph drainage;Therapeutic activities;Coping strategies training;DME and/or AE instruction;Compression bandaging;Other (comment);Patient/family education   fit with custom, BLE compression garments and hours of sleep devices   Plan  CDT includes manual lymphatic drainage (MLD), skin care, compression wrapping and garment fitting, therapeutic exercise and emphasis on Pt and CG self care training    Clinical Decision Making  Multiple treatment options, significant  modification of task necessary    Consulted and Agree with Plan of Care  Patient;Family member/caregiver       Patient will benefit from skilled therapeutic intervention in order to improve the following deficits and impairments:  Abnormal gait, Decreased endurance, Decreased skin integrity, Cardiopulmonary status limiting activity, Decreased knowledge of precautions, Impaired perceived functional ability, Decreased activity tolerance, Decreased knowledge of use of DME, Decreased strength, Decreased balance, Decreased mobility, Difficulty walking, Obesity, Decreased range of motion, Increased edema, Pain  Visit Diagnosis: Lymphedema, not elsewhere classified    Problem List Patient Active Problem List   Diagnosis Date Noted  . AKI (acute kidney injury) ([place])   . Acute CHF (congestive heart failure) ([place]) 08/28/2017  . Hypertensive emergency 08/28/2017  . Obesity, Class III, BMI 40-49.9 (morbid obesity) ([place]) 08/28/2017  . Hypokalemia 08/28/2017  . Acute on chronic combined systolic and diastolic CHF (congestive heart failure) ([place])   . Persistent atrial fibrillation ([place])   . Essential hypertension   . Stage 3 chronic kidney disease ([place])   . Hypercholesterolemia 12/17/2015  . Routine health maintenance 07/23/2015  . 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 [place], [place], [address] Phone: [phone]   Fax:  [phone]  Name: [name] Long MRN: [id number] Date of Birth: [birth year]

## 2017-11-06 ENCOUNTER — Ambulatory Visit: Payer: Medicare Other | Attending: Orthopedic Surgery | Admitting: Occupational Therapy

## 2017-11-06 DIAGNOSIS — I890 Lymphedema, not elsewhere classified: Secondary | ICD-10-CM | POA: Diagnosis present

## 2017-11-06 NOTE — Therapy (Signed)
[place] MAIN [facility] SERVICES [address] [place], [place], [address] Phone: [phone]   Fax:  [phone]  Occupational Therapy Treatment  Patient Details  Name: [name] Long MRN: [id number] Date of Birth: 07/02/1940 Referring Provider: Lowella Petties, MD   Encounter Date: 11/06/2017  OT End of Session - 11/06/17 1214    Visit Number  11    Number of Visits  36    Date for OT Re-Evaluation  01/08/18    OT Start Time  0805    OT Stop Time  0909    OT Time Calculation (min)  64 min    Activity Tolerance  Patient tolerated treatment well;No increased pain    Behavior During Therapy  WFL for tasks assessed/performed       Past Medical History:  Diagnosis Date  . Atrial fibrillation ([place])   . Hypertension     Past Surgical History:  Procedure Laterality Date  . CESAREAN SECTION      There were no vitals filed for this visit.  Subjective Assessment - 11/06/17 1019    Subjective   Pt presents for OT visit 11/36 to address BLE LE. Pt is accompanied by her daughter, [name] Long. Pt reporting tenderness below knee at RLE ( current Rx limb) over the weekend.  (Pended)     Patient is accompained by:  Family member  (Pended)     Pertinent History  contributing comorbidities include: acute CHF, persistent Afib, HTN, Stage III CKD, obesity, class III  ([place])     Limitations  difficulty walking, fall risk, chronic, progressive BLE swelling and    pain,  impaired transfers and functional mobility, decreased activity tolerance, impaired balance, limitted standing tolerance- dependent position exacerbates BLE swelling and discomfort, impaired stair climbing (difficulty lifting legs), BLE weakness, difficulty fitting LB clothing and street shoes due to progressive LE swelling, impaired basic ADLs, LB dressing, bathing, skin and nail care, diabetic skin infection)  (Pended)     Currently in Pain?  Yes  (Pended)    not rated numerically; L>R   Pain Onset   Other (comment)  ([place])    onset May 2019                  OT Treatments/Exercises (OP) - 11/06/17 0001      Manual Therapy   Manual Therapy  Edema management;Manual Lymphatic Drainage (MLD);Compression Bandaging    Edema Management  skin care  to RLE using low ph castor oil throughout MLD    Manual Lymphatic Drainage (MLD)  MLD in supine  using short neck sequence. deep abdominals and functional inguinal LN  to RLE.     Compression Bandaging  RLE thigh  length gradient compressoion wrap from foot to below knee (A-D landmarks). Utilized Rosidal foam in single  overlapping layer (~ 50%) over cotton stockinett, w/ 8,10 and 12 cm short stretch wraps applied in gradient config designed to move fluid congestion from distal to proximal.             OT Education - 11/06/17 1212    Education Details  Continued to discuss compression garment options , recommendations and plans for measurement going forward with Pt and daughter. Pt and daughter verbalize understanding of "trial and error" nature of fitting custm garments in this case due to tissue density differenes above and below the knee.    Person(s) Educated  Patient;Child(ren)    Methods  Explanation;Demonstration    Comprehension  Verbalized understanding;Returned demonstration  OT Long Term Goals - 11/01/17 0953      OT LONG TERM GOAL #1   Title  Pt modified independent w/ lymphedema precautions/prevention principals and using printed reference to limit LE progression and infection risk.    Baseline  Max A Met 10/25/17    Time  2    Period  Weeks    Status  Achieved      OT LONG TERM GOAL #2   Title  Lymphedema (LE) management/ self-care: Pt able to apply knee length gradient compression wraps with maximum caregiver assistance daily between OT visits using correct gradient techniques within 2 weeks to achieve optimal limb volume reduction during Intensive Phase of CDT and optimal self-management over time.     Baseline  Dependent Goal MET 11/01/17 for full length compression wraps. CG is independent and uses excellent technique.    Time  2    Period  Weeks    Status  Achieved      OT LONG TERM GOAL #3   Title  Lymphedema (LE) management/ self-care:  Pt to achieve at least 10%  limb volume reduction in BLE  during Intensive Phase CDT to limit LE progression, to decrease infection risk and to improve functional mobility and ambulation essential for optimal ADLs performance.    Baseline  dependent  11/01/17 Goal met and exceeded for RLE.     Time  12    Period  Weeks    Status  Partially Met      OT LONG TERM GOAL #4   Title  Lymphedema (LE) management/ self-care:  Pt to tolerate daily compression garments and/ or HOS devices in keeping w/ prescribed wear regime within 1 week of issue date to restore normal limb shape to reduce tissue density and protein build up leading to progression.    Baseline  Dependent    Time  12    Period  Weeks    Status  New      OT LONG TERM GOAL #5   Title  Lymphedema management/ Self-care: Pt able to don and doff compression garments and/ or devices with maximum assistance using assistive devices and extra times PRN in order to ensure optimal lymphedema management over time to limit progression risk and infection.    Baseline  Dependent    Time  12    Period  Weeks    Status  New      OT LONG TERM GOAL #6   Title  Lymphedema (LE) management/ self-care:  During Management Phase CDT Pt to sustain reduced limb volumes achieved during Intensive Phase CDT at follow up visits during Management Phase CDT within 3% using LE self-care home program components as directed ( simple self MLD, skin care, ther ex and daily/ nightly compression garments/ devices) .    Baseline  dependent    Time  6    Period  Months    Status  New            Plan - 11/06/17 1214    Clinical Impression Statement  Provided Pt edu re comression garment choices and plans for measurement,  assessment and fitting. Continued to discuss compression garment options , recommendations and plans for measurement going forward with Pt and daughter. Pt and daughter verbalize understanding of "trial and error" nature of fitting custm garments in this case due to the differences in tissue density between leg below the knee ( dense) with thigh ( soft, fatty, doughy). Team agrees on trial of   custom  thifgh length compression garments initially. Pt tolerated MLD and compression wraps today without difficulty. Measure for RLE compression next visit.    Occupational Profile and client history currently impacting functional performance  lives with son    as is unable to climb stairs at entrance of daughter's home; plans to return to home in [place] in October 2019; unsure about assistance in [place] as all adults children reside here    Occupational performance deficits (Please refer to evaluation for details):  ADL's;IADL's;Leisure;Social Participation    Rehab Potential  Fair    Current Impairments/barriers affecting progress:  Without max caregiver assistance with LE self-care home program between visits prognosis for improvement is poor    OT Frequency  3x / week    OT Duration  12 weeks   and PRN due to severity of BLE   OT Treatment/Interventions  Self-care/ADL training;Therapeutic exercise;Functional Mobility Training;Manual Therapy;Manual lymph drainage;Therapeutic activities;Coping strategies training;DME and/or AE instruction;Compression bandaging;Other (comment);Patient/family education   fit with custom, BLE compression garments and hours of sleep devices   Plan  CDT includes manual lymphatic drainage (MLD), skin care, compression wrapping and garment fitting, therapeutic exercise and emphasis on Pt and CG self care training    Clinical Decision Making  Multiple treatment options, significant modification of task necessary    Consulted and Agree with Plan of Care  Patient;Family  member/caregiver       Patient will benefit from skilled therapeutic intervention in order to improve the following deficits and impairments:  Abnormal gait, Decreased endurance, Decreased skin integrity, Cardiopulmonary status limiting activity, Decreased knowledge of precautions, Impaired perceived functional ability, Decreased activity tolerance, Decreased knowledge of use of DME, Decreased strength, Decreased balance, Decreased mobility, Difficulty walking, Obesity, Decreased range of motion, Increased edema, Pain  Visit Diagnosis: Lymphedema, not elsewhere classified    Problem List Patient Active Problem List   Diagnosis Date Noted  . AKI (acute kidney injury) ([place])   . Acute CHF (congestive heart failure) ([place]) 08/28/2017  . Hypertensive emergency 08/28/2017  . Obesity, Class III, BMI 40-49.9 (morbid obesity) ([place]) 08/28/2017  . Hypokalemia 08/28/2017  . Acute on chronic combined systolic and diastolic CHF (congestive heart failure) ([place])   . Persistent atrial fibrillation ([place])   . Essential hypertension   . Stage 3 chronic kidney disease ([place])   . Hypercholesterolemia 12/17/2015  . Routine health maintenance 07/23/2015  . 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 [place], [place], [address] Phone: [phone]   Fax:  [phone]  Name: [name] Long MRN: [id number] Date of Birth: January 18, 1941

## 2017-11-08 ENCOUNTER — Ambulatory Visit: Payer: Medicare Other | Admitting: Occupational Therapy

## 2017-11-08 DIAGNOSIS — I890 Lymphedema, not elsewhere classified: Secondary | ICD-10-CM

## 2017-11-08 NOTE — Therapy (Signed)
[place] MAIN [facility] SERVICES [address] [place], [place], [address] Phone: [phone]   Fax:  [phone]  Occupational Therapy Treatment  Patient Details  Name: [name] Long MRN: [id number] Date of Birth: 11-May-1940 Referring Provider: Lowella Petties, MD   Encounter Date: 11/08/2017  OT End of Session - 11/08/17 0943    Visit Number  12    Number of Visits  36    Date for OT Re-Evaluation  01/08/18    OT Start Time  0800    OT Stop Time  0900    OT Time Calculation (min)  60 min    Activity Tolerance  Patient tolerated treatment well;No increased pain    Behavior During Therapy  WFL for tasks assessed/performed       Past Medical History:  Diagnosis Date  . Atrial fibrillation ([place])   . Hypertension     Past Surgical History:  Procedure Laterality Date  . CESAREAN SECTION      There were no vitals filed for this visit.  Subjective Assessment - 11/08/17 0808    Subjective   Pt presents for OT visit 11/36 to address BLE LE. Pt is accompanied by her daughter, [name]. Pt reporting tenderness below knee at RLE ( current Rx limb) over the weekend.  (Pended)     Patient is accompained by:  Family member  (Pended)     Pertinent History  contributing comorbidities include: acute CHF, persistent Afib, HTN, Stage III CKD, obesity, class III  (Pended)     Limitations  difficulty walking, fall risk, chronic, progressive BLE swelling and    pain,  impaired transfers and functional mobility, decreased activity tolerance, impaired balance, limitted standing tolerance- dependent position exacerbates BLE swelling and discomfort, impaired stair climbing (difficulty lifting legs), BLE weakness, difficulty fitting LB clothing and street shoes due to progressive LE swelling, impaired basic ADLs, LB dressing, bathing, skin and nail care, diabetic skin infection)  (Pended)     Pain Onset  Other (comment)  (Pended)    onset May 2019                   OT Treatments/Exercises (OP) - 11/08/17 0001      ADLs   ADL Education Given  Yes      Manual Therapy   Manual Therapy  Edema management    Manual therapy comments  completed anatomical measurements for RLE custom compression thigh highs- ccl 2 Elvarex classic    Edema Management  skin care w/ Eucerin lotion to RLE below knee  before aplying compression wraps    Compression Bandaging  RLE thigh  length gradient compressoion wrap from foot to below knee (A-D landmarks). Utilized Rosidal foam in single  overlapping layer (~ 50%) over cotton stockinett, w/ 8,10 and 12 cm short stretch wraps applied in gradient config designed to move fluid congestion from distal to proximal.             OT Education - 11/08/17 0943    Education Details  Continued to discuss compression garment options , recommendations and plans for measurement going forward with Pt and daughter. Pt and daughter verbalize understanding of "trial and error" nature of fitting custm garments in this case due to tissue density differenes above and below the knee.    Person(s) Educated  Patient;Child(ren)    Methods  Explanation;Demonstration    Comprehension  Verbalized understanding;Returned demonstration          OT Long Term  Goals - 11/01/17 0953      OT LONG TERM GOAL #1   Title  Pt modified independent w/ lymphedema precautions/prevention principals and using printed reference to limit LE progression and infection risk.    Baseline  Max A Met 10/25/17    Time  2    Period  Weeks    Status  Achieved      OT LONG TERM GOAL #2   Title  Lymphedema (LE) management/ self-care: Pt able to apply knee length gradient compression wraps with maximum caregiver assistance daily between OT visits using correct gradient techniques within 2 weeks to achieve optimal limb volume reduction during Intensive Phase of CDT and optimal self-management over time.    Baseline  Dependent Goal MET 11/01/17 for  full length compression wraps. CG is independent and uses excellent technique.    Time  2    Period  Weeks    Status  Achieved      OT LONG TERM GOAL #3   Title  Lymphedema (LE) management/ self-care:  Pt to achieve at least 10%  limb volume reduction in BLE  during Intensive Phase CDT to limit LE progression, to decrease infection risk and to improve functional mobility and ambulation essential for optimal ADLs performance.    Baseline  dependent  11/01/17 Goal met and exceeded for RLE.     Time  12    Period  Weeks    Status  Partially Met      OT LONG TERM GOAL #4   Title  Lymphedema (LE) management/ self-care:  Pt to tolerate daily compression garments and/ or HOS devices in keeping w/ prescribed wear regime within 1 week of issue date to restore normal limb shape to reduce tissue density and protein build up leading to progression.    Baseline  Dependent    Time  12    Period  Weeks    Status  New      OT LONG TERM GOAL #5   Title  Lymphedema management/ Self-care: Pt able to don and doff compression garments and/ or devices with maximum assistance using assistive devices and extra times PRN in order to ensure optimal lymphedema management over time to limit progression risk and infection.    Baseline  Dependent    Time  12    Period  Weeks    Status  New      OT LONG TERM GOAL #6   Title  Lymphedema (LE) management/ self-care:  During Management Phase CDT Pt to sustain reduced limb volumes achieved during Intensive Phase CDT at follow up visits during Management Phase CDT within 3% using LE self-care home program components as directed ( simple self MLD, skin care, ther ex and daily/ nightly compression garments/ devices) .    Baseline  dependent    Time  6    Period  Months    Status  New            Plan - 11/08/17 0944    Clinical Impression Statement  Completed anatomical measurements for RLE custom thigh length custom Elvarex classic- ccl 2. Submitted with  application for charitable funds to DME vendor for invoice. Applied RLE thigh length compression wraps as established. Cont as per POC.    Occupational Profile and client history currently impacting functional performance  lives with son    as is unable to climb stairs at entrance of daughter's home; plans to return to home in [place] in October 2019;  unsure about assistance in [facility] as all adults children reside here    Occupational performance deficits (Please refer to evaluation for details):  ADL's;IADL's;Leisure;Social Participation    Rehab Potential  Fair    Current Impairments/barriers affecting progress:  Without max caregiver assistance with LE self-care home program between visits prognosis for improvement is poor    OT Frequency  3x / week    OT Duration  12 weeks   and PRN due to severity of BLE   OT Treatment/Interventions  Self-care/ADL training;Therapeutic exercise;Functional Mobility Training;Manual Therapy;Manual lymph drainage;Therapeutic activities;Coping strategies training;DME and/or AE instruction;Compression bandaging;Other (comment);Patient/family education   fit with custom, BLE compression garments and hours of sleep devices   Plan  CDT includes manual lymphatic drainage (MLD), skin care, compression wrapping and garment fitting, therapeutic exercise and emphasis on Pt and CG self care training    Clinical Decision Making  Multiple treatment options, significant modification of task necessary    Consulted and Agree with Plan of Care  Patient;Family member/caregiver       Patient will benefit from skilled therapeutic intervention in order to improve the following deficits and impairments:  Abnormal gait, Decreased endurance, Decreased skin integrity, Cardiopulmonary status limiting activity, Decreased knowledge of precautions, Impaired perceived functional ability, Decreased activity tolerance, Decreased knowledge of use of DME, Decreased strength, Decreased balance,  Decreased mobility, Difficulty walking, Obesity, Decreased range of motion, Increased edema, Pain  Visit Diagnosis: Lymphedema, not elsewhere classified    Problem List Patient Active Problem List   Diagnosis Date Noted  . AKI (acute kidney injury) ([place])   . Acute CHF (congestive heart failure) ([place]) 08/28/2017  . Hypertensive emergency 08/28/2017  . Obesity, Class III, BMI 40-49.9 (morbid obesity) ([place]) 08/28/2017  . Hypokalemia 08/28/2017  . Acute on chronic combined systolic and diastolic CHF (congestive heart failure) ([place])   . Persistent atrial fibrillation ([place])   . Essential hypertension   . Stage 3 chronic kidney disease ([place])   . Hypercholesterolemia 12/17/2015  . Routine health maintenance 07/23/2015  . 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, [place], [address] Phone: [phone]   Fax:  [phone]  Name: Shariah Assad MRN: [id number] Date of Birth: 1941-03-02

## 2017-11-12 ENCOUNTER — Ambulatory Visit: Payer: Medicare Other | Admitting: Occupational Therapy

## 2017-11-12 DIAGNOSIS — I890 Lymphedema, not elsewhere classified: Secondary | ICD-10-CM | POA: Diagnosis not present

## 2017-11-12 NOTE — Therapy (Signed)
[place] MAIN [facility] SERVICES [address] [place], [place], [address] Phone: [phone]   Fax:  [phone]  Occupational Therapy Treatment  Patient Details  Name: [name] Long MRN: [id number] Date of Birth: 1940/10/18 Referring Provider: Lowella Petties, MD   Encounter Date: 11/12/2017  OT End of Session - 11/12/17 1205    Visit Number  13    Number of Visits  36    Date for OT Re-Evaluation  01/08/18    OT Start Time  0805    OT Stop Time  0903    OT Time Calculation (min)  58 min    Activity Tolerance  Patient tolerated treatment well;No increased pain    Behavior During Therapy  WFL for tasks assessed/performed       Past Medical History:  Diagnosis Date  . Atrial fibrillation ([place])   . Hypertension     Past Surgical History:  Procedure Laterality Date  . CESAREAN SECTION      There were no vitals filed for this visit.  Subjective Assessment - 11/12/17 1201    Subjective   Pt presents for OT visit 13/36 to address BLE LE. Pt is accompanied by her daughter, [name]. Pt brings 6 instead of 6 compression wraps to clinic today. Daughter reports transfers continue to improve.     Patient is accompained by:  Family member    Pertinent History  contributing comorbidities include: acute CHF, persistent Afib, HTN, Stage III CKD, obesity, class III    Limitations  difficulty walking, fall risk, chronic, progressive BLE swelling and    pain,  impaired transfers and functional mobility, decreased activity tolerance, impaired balance, limitted standing tolerance- dependent position exacerbates BLE swelling and discomfort, impaired stair climbing (difficulty lifting legs), BLE weakness, difficulty fitting LB clothing and street shoes due to progressive LE swelling, impaired basic ADLs, LB dressing, bathing, skin and nail care, diabetic skin infection)    Currently in Pain?  No/denies    Pain Onset  Other (comment)   onset May 2019                   OT Treatments/Exercises (OP) - 11/12/17 0001      ADLs   ADL Education Given  Yes      Manual Therapy   Manual Therapy  Edema management;Compression Bandaging;Internal Pelvic Floor    Edema Management  skin care w/ Eucerin lotion to RLE below knee  before aplying compression wraps    Manual Lymphatic Drainage (MLD)  MLD in supine  using short neck sequence. deep abdominals and functional inguinal LN  to RLE.     Compression Bandaging  RLE thigh  length gradient compressoion wrap from foot to below knee (A-D landmarks). Utilized Rosidal foam in single  overlapping layer (~ 50%) over cotton stockinett, w/ 8,10 and 12 cm short stretch wraps applied in gradient config designed to move fluid congestion from distal to proximal.             OT Education - 11/12/17 1203    Education Details  Cont Pt edu for LE self care with emphasis on simple self MLD. By end of session Pt demonstrates correct J stroke, shourt neck sequence, inguinal stimulation and J strokes to anterior L leg using correct pace and techniques    Person(s) Educated  Patient    Methods  Explanation;Demonstration    Comprehension  Verbalized understanding;Returned demonstration;Need further instruction          OT  Long Term Goals - 11/01/17 0953      OT LONG TERM GOAL #1   Title  Pt modified independent w/ lymphedema precautions/prevention principals and using printed reference to limit LE progression and infection risk.    Baseline  Max A Met 10/25/17    Time  2    Period  Weeks    Status  Achieved      OT LONG TERM GOAL #2   Title  Lymphedema (LE) management/ self-care: Pt able to apply knee length gradient compression wraps with maximum caregiver assistance daily between OT visits using correct gradient techniques within 2 weeks to achieve optimal limb volume reduction during Intensive Phase of CDT and optimal self-management over time.    Baseline  Dependent Goal MET 11/01/17 for full  length compression wraps. CG is independent and uses excellent technique.    Time  2    Period  Weeks    Status  Achieved      OT LONG TERM GOAL #3   Title  Lymphedema (LE) management/ self-care:  Pt to achieve at least 10%  limb volume reduction in BLE  during Intensive Phase CDT to limit LE progression, to decrease infection risk and to improve functional mobility and ambulation essential for optimal ADLs performance.    Baseline  dependent  11/01/17 Goal met and exceeded for RLE.     Time  12    Period  Weeks    Status  Partially Met      OT LONG TERM GOAL #4   Title  Lymphedema (LE) management/ self-care:  Pt to tolerate daily compression garments and/ or HOS devices in keeping w/ prescribed wear regime within 1 week of issue date to restore normal limb shape to reduce tissue density and protein build up leading to progression.    Baseline  Dependent    Time  12    Period  Weeks    Status  New      OT LONG TERM GOAL #5   Title  Lymphedema management/ Self-care: Pt able to don and doff compression garments and/ or devices with maximum assistance using assistive devices and extra times PRN in order to ensure optimal lymphedema management over time to limit progression risk and infection.    Baseline  Dependent    Time  12    Period  Weeks    Status  New      OT LONG TERM GOAL #6   Title  Lymphedema (LE) management/ self-care:  During Management Phase CDT Pt to sustain reduced limb volumes achieved during Intensive Phase CDT at follow up visits during Management Phase CDT within 3% using LE self-care home program components as directed ( simple self MLD, skin care, ther ex and daily/ nightly compression garments/ devices) .    Baseline  dependent    Time  6    Period  Months    Status  New            Plan - 11/12/17 1206    Clinical Impression Statement  Provided more in-depth Pt edu for LE self care today w/ emphasis on learning simple self-MLD sequences for non-cancer  related BLE lymphedema. By end of session Pt able to perform J stroke , short neck sequence, deep abdominal breathing,  stationary inuinal strokes and dynamic strokes to anterior R thigh with min A. Cont next visit to complete entire self-MLD program. Provide MLD, skin care and compression wrap to RLE. Swelling very well controled and  skin condition continues to improve. Cont as per POC.    Occupational Profile and client history currently impacting functional performance  lives with son    as is unable to climb stairs at entrance of daughter's home; plans to return to home in [place] in October 2019; unsure about assistance in [place] as all adults children reside here    Occupational performance deficits (Please refer to evaluation for details):  ADL's;IADL's;Leisure;Social Participation    Rehab Potential  Fair    Current Impairments/barriers affecting progress:  Without max caregiver assistance with LE self-care home program between visits prognosis for improvement is poor    OT Frequency  3x / week    OT Duration  12 weeks   and PRN due to severity of BLE   OT Treatment/Interventions  Self-care/ADL training;Therapeutic exercise;Functional Mobility Training;Manual Therapy;Manual lymph drainage;Therapeutic activities;Coping strategies training;DME and/or AE instruction;Compression bandaging;Other (comment);Patient/family education   fit with custom, BLE compression garments and hours of sleep devices   Plan  CDT includes manual lymphatic drainage (MLD), skin care, compression wrapping and garment fitting, therapeutic exercise and emphasis on Pt and CG self care training    Clinical Decision Making  Multiple treatment options, significant modification of task necessary    Consulted and Agree with Plan of Care  Patient;Family member/caregiver       Patient will benefit from skilled therapeutic intervention in order to improve the following deficits and impairments:  Abnormal gait, Decreased  endurance, Decreased skin integrity, Cardiopulmonary status limiting activity, Decreased knowledge of precautions, Impaired perceived functional ability, Decreased activity tolerance, Decreased knowledge of use of DME, Decreased strength, Decreased balance, Decreased mobility, Difficulty walking, Obesity, Decreased range of motion, Increased edema, Pain  Visit Diagnosis: Lymphedema, not elsewhere classified    Problem List Patient Active Problem List   Diagnosis Date Noted  . AKI (acute kidney injury) ([place])   . Acute CHF (congestive heart failure) ([place]) 08/28/2017  . Hypertensive emergency 08/28/2017  . Obesity, Class III, BMI 40-49.9 (morbid obesity) ([place]) 08/28/2017  . Hypokalemia 08/28/2017  . Acute on chronic combined systolic and diastolic CHF (congestive heart failure) ([place])   . Persistent atrial fibrillation ([place])   . Essential hypertension   . Stage 3 chronic kidney disease ([place])   . Hypercholesterolemia 12/17/2015  . Routine health maintenance 07/23/2015  . 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 11/12/17 12:11 PM  [place] MAIN [facility] SERVICES [address] [place], [place], [address] Phone: [phone]   Fax:  [phone]  Name: [name] Long MRN: [id number] Date of Birth: 1940-05-22

## 2017-11-14 ENCOUNTER — Ambulatory Visit: Payer: Medicare Other | Admitting: Occupational Therapy

## 2017-11-14 DIAGNOSIS — I890 Lymphedema, not elsewhere classified: Secondary | ICD-10-CM

## 2017-11-14 NOTE — Therapy (Signed)
[place] MAIN [facility] SERVICES [address] [place], [place], [address] Phone: [phone]   Fax:  [phone]  Occupational Therapy Treatment  Patient Details  Name: [name] Long MRN: [id number] Date of Birth: [birth year] Referring Provider: Lowella Petties, MD   Encounter Date: 11/14/2017  OT End of Session - 11/14/17 1014    Visit Number  14    Number of Visits  36    Date for OT Re-Evaluation  01/08/18    OT Start Time  0800    OT Stop Time  0914    OT Time Calculation (min)  74 min    Activity Tolerance  Patient tolerated treatment well;No increased pain    Behavior During Therapy  WFL for tasks assessed/performed       Past Medical History:  Diagnosis Date  . Atrial fibrillation ([place])   . Hypertension     Past Surgical History:  Procedure Laterality Date  . CESAREAN SECTION      There were no vitals filed for this visit.  Subjective Assessment - 11/14/17 0807    Subjective   Pt presents for OT visit 14/36 to address BLE LE. Pt is accompanied by her daughter, [name]. Pt reports, "...not a thing. Everything is good."    Patient is accompained by:  Family member    Pertinent History  contributing comorbidities include: acute CHF, persistent Afib, HTN, Stage III CKD, obesity, class III    Limitations  difficulty walking, fall risk, chronic, progressive BLE swelling and    pain,  impaired transfers and functional mobility, decreased activity tolerance, impaired balance, limitted standing tolerance- dependent position exacerbates BLE swelling and discomfort, impaired stair climbing (difficulty lifting legs), BLE weakness, difficulty fitting LB clothing and street shoes due to progressive LE swelling, impaired basic ADLs, LB dressing, bathing, skin and nail care, diabetic skin infection)    Currently in Pain?  No/denies    Pain Onset  Other (comment)   onset May 2019                  OT Treatments/Exercises (OP) -  11/14/17 0001      ADLs   ADL Education Given  Yes  (Pended)       Manual Therapy   Manual Therapy  Edema management;Compression Bandaging;Manual Lymphatic Drainage (MLD)  (Pended)     Edema Management  skin care w/ Eucerin lotion to RLE below knee  before aplying compression wraps  (Pended)     Manual Lymphatic Drainage (MLD)  MLD in supine to LLE today,  using short neck sequence. deep abdominals and functional inguinal LN  to RLE.   (Pended)     Compression Bandaging  RLE thigh  length gradient compressoion wrap from foot to below knee (A-D landmarks). Utilized Rosidal foam in single  overlapping layer (~ 50%) over cotton stockinett, w/ 8,10 and 12 cm short stretch wraps applied in gradient config designed to move fluid congestion from distal to proximal.  (Pended)                   OT Long Term Goals - 11/01/17 0953      OT LONG TERM GOAL #1   Title  Pt modified independent w/ lymphedema precautions/prevention principals and using printed reference to limit LE progression and infection risk.    Baseline  Max A Met 10/25/17    Time  2    Period  Weeks    Status  Achieved  OT LONG TERM GOAL #2   Title  Lymphedema (LE) management/ self-care: Pt able to apply knee length gradient compression wraps with maximum caregiver assistance daily between OT visits using correct gradient techniques within 2 weeks to achieve optimal limb volume reduction during Intensive Phase of CDT and optimal self-management over time.    Baseline  Dependent Goal MET 11/01/17 for full length compression wraps. CG is independent and uses excellent technique.    Time  2    Period  Weeks    Status  Achieved      OT LONG TERM GOAL #3   Title  Lymphedema (LE) management/ self-care:  Pt to achieve at least 10%  limb volume reduction in BLE  during Intensive Phase CDT to limit LE progression, to decrease infection risk and to improve functional mobility and ambulation essential for optimal ADLs  performance.    Baseline  dependent  11/01/17 Goal met and exceeded for RLE.     Time  12    Period  Weeks    Status  Partially Met      OT LONG TERM GOAL #4   Title  Lymphedema (LE) management/ self-care:  Pt to tolerate daily compression garments and/ or HOS devices in keeping w/ prescribed wear regime within 1 week of issue date to restore normal limb shape to reduce tissue density and protein build up leading to progression.    Baseline  Dependent    Time  12    Period  Weeks    Status  New      OT LONG TERM GOAL #5   Title  Lymphedema management/ Self-care: Pt able to don and doff compression garments and/ or devices with maximum assistance using assistive devices and extra times PRN in order to ensure optimal lymphedema management over time to limit progression risk and infection.    Baseline  Dependent    Time  12    Period  Weeks    Status  New      OT LONG TERM GOAL #6   Title  Lymphedema (LE) management/ self-care:  During Management Phase CDT Pt to sustain reduced limb volumes achieved during Intensive Phase CDT at follow up visits during Management Phase CDT within 3% using LE self-care home program components as directed ( simple self MLD, skin care, ther ex and daily/ nightly compression garments/ devices) .    Baseline  dependent    Time  6    Period  Months    Status  New            Plan - 11/14/17 1015    Clinical Impression Statement  Comenced MLD and skin care to LLE today. PalApplied LLE knee length, 3 layer , gradient wrap and instructed Pt to remove these if she experiences any atypical SOB, difficulty walking or acute pain. Pt tolerated manual therapy without difficulty. There was a palpable reduction in LLE tissue density above the knee at distal medial thigh after manual therapy. Thigh length wraps applied to RLE as established,. Cont as per POC.    Occupational Profile and client history currently impacting functional performance  lives with son    as is  unable to climb stairs at entrance of daughter's home; plans to return to home in [place] in October 2019; unsure about assistance in [place] as all adults children reside here    Occupational performance deficits (Please refer to evaluation for details):  ADL's;IADL's;Leisure;Social Participation    Rehab Potential  Fair    Current Impairments/barriers affecting progress:  Without max caregiver assistance with LE self-care home program between visits prognosis for improvement is poor    OT Frequency  3x / week    OT Duration  12 weeks   and PRN due to severity of BLE   OT Treatment/Interventions  Self-care/ADL training;Therapeutic exercise;Functional Mobility Training;Manual Therapy;Manual lymph drainage;Therapeutic activities;Coping strategies training;DME and/or AE instruction;Compression bandaging;Other (comment);Patient/family education   fit with custom, BLE compression garments and hours of sleep devices   Plan  CDT includes manual lymphatic drainage (MLD), skin care, compression wrapping and garment fitting, therapeutic exercise and emphasis on Pt and CG self care training    Clinical Decision Making  Multiple treatment options, significant modification of task necessary    Consulted and Agree with Plan of Care  Patient;Family member/caregiver       Patient will benefit from skilled therapeutic intervention in order to improve the following deficits and impairments:  Abnormal gait, Decreased endurance, Decreased skin integrity, Cardiopulmonary status limiting activity, Decreased knowledge of precautions, Impaired perceived functional ability, Decreased activity tolerance, Decreased knowledge of use of DME, Decreased strength, Decreased balance, Decreased mobility, Difficulty walking, Obesity, Decreased range of motion, Increased edema, Pain  Visit Diagnosis: Lymphedema, not elsewhere classified    Problem List Patient Active Problem List   Diagnosis Date Noted  . AKI (acute kidney  injury) ([place])   . Acute CHF (congestive heart failure) ([place]) 08/28/2017  . Hypertensive emergency 08/28/2017  . Obesity, Class III, BMI 40-49.9 (morbid obesity) ([place]) 08/28/2017  . Hypokalemia 08/28/2017  . Acute on chronic combined systolic and diastolic CHF (congestive heart failure) ([place])   . Persistent atrial fibrillation ([place])   . Essential hypertension   . Stage 3 chronic kidney disease ([place])   . Hypercholesterolemia 12/17/2015  . Routine health maintenance 07/23/2015  . 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 [place], [place], [address] Phone: [phone]   Fax:  [phone]  Name: [name] Long MRN: [id number] Date of Birth: [birth year]

## 2017-11-15 ENCOUNTER — Ambulatory Visit: Payer: Medicare Other | Admitting: Occupational Therapy

## 2017-11-15 DIAGNOSIS — I890 Lymphedema, not elsewhere classified: Secondary | ICD-10-CM

## 2017-11-15 NOTE — Therapy (Signed)
[place] MAIN [facility] SERVICES [address] [place], [place], [address] Phone: [phone]   Fax:  [phone]  Occupational Therapy Treatment  Patient Details  Name: [name] Long MRN: [id number] Date of Birth: 22-Feb-1941 Referring Provider: Lowella Petties, MD   Encounter Date: 11/15/2017  OT End of Session - 11/15/17 0926    Visit Number  15    Number of Visits  36    Date for OT Re-Evaluation  01/08/18    OT Start Time  0800    OT Stop Time  0908    OT Time Calculation (min)  68 min    Activity Tolerance  Patient tolerated treatment well;No increased pain    Behavior During Therapy  WFL for tasks assessed/performed       Past Medical History:  Diagnosis Date  . Atrial fibrillation ([place])   . Hypertension     Past Surgical History:  Procedure Laterality Date  . CESAREAN SECTION      There were no vitals filed for this visit.  Subjective Assessment - 11/15/17 0807    Subjective   Pt presents for OT visit 15/36 to address BLE LE. Pt is accompanied by her daughter, [name] Long. Pt had no difficulty tolerating knee length compression wraps on LLE over night. She denies SOB.    Patient is accompained by:  Family member    Pertinent History  contributing comorbidities include: acute CHF, persistent Afib, HTN, Stage III CKD, obesity, class III    Limitations  difficulty walking, fall risk, chronic, progressive BLE swelling and    pain,  impaired transfers and functional mobility, decreased activity tolerance, impaired balance, limitted standing tolerance- dependent position exacerbates BLE swelling and discomfort, impaired stair climbing (difficulty lifting legs), BLE weakness, difficulty fitting LB clothing and street shoes due to progressive LE swelling, impaired basic ADLs, LB dressing, bathing, skin and nail care, diabetic skin infection)    Pain Onset  Other (comment)   onset May 2019                  OT Treatments/Exercises  (OP) - 11/15/17 0001      ADLs   ADL Education Given  Yes      Manual Therapy   Manual Therapy  Edema management;Compression Bandaging;Manual Lymphatic Drainage (MLD)    Edema Management  skin care w/ castor oil throughout MLD    Manual Lymphatic Drainage (MLD)  MLD in supine to LLE today,  using short neck sequence. deep abdominals and functional inguinal LN  to RLE.     Compression Bandaging  LLE knee length gradient compressoion wrap from foot to below knee (A-D landmarks) - 4 layers.  RLE thigh length gradient compression wrap as established- 6 ;ayers.             OT Education - 11/15/17 0926    Education Details  Cont Pt edu for LE self care with emphasis on skin care using ,low ph lotion and castor oil . Provided on line resources.    Person(s) Educated  Patient;Child(ren)    Methods  Explanation;Demonstration    Comprehension  Verbalized understanding;Returned demonstration;Need further instruction          OT Long Term Goals - 11/01/17 0953      OT LONG TERM GOAL #1   Title  Pt modified independent w/ lymphedema precautions/prevention principals and using printed reference to limit LE progression and infection risk.    Baseline  Max A Met 10/25/17  Time  2    Period  Weeks    Status  Achieved      OT LONG TERM GOAL #2   Title  Lymphedema (LE) management/ self-care: Pt able to apply knee length gradient compression wraps with maximum caregiver assistance daily between OT visits using correct gradient techniques within 2 weeks to achieve optimal limb volume reduction during Intensive Phase of CDT and optimal self-management over time.    Baseline  Dependent Goal MET 11/01/17 for full length compression wraps. CG is independent and uses excellent technique.    Time  2    Period  Weeks    Status  Achieved      OT LONG TERM GOAL #3   Title  Lymphedema (LE) management/ self-care:  Pt to achieve at least 10%  limb volume reduction in BLE  during Intensive Phase CDT to  limit LE progression, to decrease infection risk and to improve functional mobility and ambulation essential for optimal ADLs performance.    Baseline  dependent  11/01/17 Goal met and exceeded for RLE.     Time  12    Period  Weeks    Status  Partially Met      OT LONG TERM GOAL #4   Title  Lymphedema (LE) management/ self-care:  Pt to tolerate daily compression garments and/ or HOS devices in keeping w/ prescribed wear regime within 1 week of issue date to restore normal limb shape to reduce tissue density and protein build up leading to progression.    Baseline  Dependent    Time  12    Period  Weeks    Status  New      OT LONG TERM GOAL #5   Title  Lymphedema management/ Self-care: Pt able to don and doff compression garments and/ or devices with maximum assistance using assistive devices and extra times PRN in order to ensure optimal lymphedema management over time to limit progression risk and infection.    Baseline  Dependent    Time  12    Period  Weeks    Status  New      OT LONG TERM GOAL #6   Title  Lymphedema (LE) management/ self-care:  During Management Phase CDT Pt to sustain reduced limb volumes achieved during Intensive Phase CDT at follow up visits during Management Phase CDT within 3% using LE self-care home program components as directed ( simple self MLD, skin care, ther ex and daily/ nightly compression garments/ devices) .    Baseline  dependent    Time  6    Period  Months    Status  New            Plan - 11/15/17 7681    Clinical Impression Statement  Pt had no difficulty tolerating 3 layer gradient compression wrap to LLE overnight. She denies SOB and any difficulty walking. Provided skin care, MLD to LLE and compression wraps  compression wraps bilaterally ( thigh length to RLE and knee length to LLE) . Pt engaged in all aspects of treatment and daughter is diligent with assistance with all home components during visit intervals. RLE custom compression  garment is ordered. Cont as per POC.Shift emphasis of CDT to LLE as soon as garment is fitted on R.    Occupational Profile and client history currently impacting functional performance  lives with son    as is unable to climb stairs at entrance of daughter's home; plans to return to home in [facility]  in October 2019; unsure about assistance in [facility] as all adults children reside here    Occupational performance deficits (Please refer to evaluation for details):  ADL's;IADL's;Leisure;Social Participation    Rehab Potential  Fair    Current Impairments/barriers affecting progress:  Without max caregiver assistance with LE self-care home program between visits prognosis for improvement is poor    OT Frequency  3x / week    OT Duration  12 weeks   and PRN due to severity of BLE   OT Treatment/Interventions  Self-care/ADL training;Therapeutic exercise;Functional Mobility Training;Manual Therapy;Manual lymph drainage;Therapeutic activities;Coping strategies training;DME and/or AE instruction;Compression bandaging;Other (comment);Patient/family education   fit with custom, BLE compression garments and hours of sleep devices   Plan  CDT includes manual lymphatic drainage (MLD), skin care, compression wrapping and garment fitting, therapeutic exercise and emphasis on Pt and CG self care training    Clinical Decision Making  Multiple treatment options, significant modification of task necessary    Consulted and Agree with Plan of Care  Patient;Family member/caregiver       Patient will benefit from skilled therapeutic intervention in order to improve the following deficits and impairments:  Abnormal gait, Decreased endurance, Decreased skin integrity, Cardiopulmonary status limiting activity, Decreased knowledge of precautions, Impaired perceived functional ability, Decreased activity tolerance, Decreased knowledge of use of DME, Decreased strength, Decreased balance, Decreased mobility, Difficulty walking,  Obesity, Decreased range of motion, Increased edema, Pain  Visit Diagnosis: Lymphedema, not elsewhere classified    Problem List Patient Active Problem List   Diagnosis Date Noted  . AKI (acute kidney injury) ([place])   . Acute CHF (congestive heart failure) ([place]) 08/28/2017  . Hypertensive emergency 08/28/2017  . Obesity, Class III, BMI 40-49.9 (morbid obesity) ([place]) 08/28/2017  . Hypokalemia 08/28/2017  . Acute on chronic combined systolic and diastolic CHF (congestive heart failure) ([place])   . Persistent atrial fibrillation ([place])   . Essential hypertension   . Stage 3 chronic kidney disease ([place])   . Hypercholesterolemia 12/17/2015  . Routine health maintenance 07/23/2015  . 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

## 2017-11-19 ENCOUNTER — Ambulatory Visit: Payer: Medicare Other | Admitting: Occupational Therapy

## 2017-11-19 DIAGNOSIS — I890 Lymphedema, not elsewhere classified: Secondary | ICD-10-CM

## 2017-11-21 ENCOUNTER — Ambulatory Visit: Payer: Medicare Other | Admitting: Occupational Therapy

## 2017-11-21 DIAGNOSIS — I890 Lymphedema, not elsewhere classified: Secondary | ICD-10-CM | POA: Diagnosis not present

## 2017-11-21 NOTE — Therapy (Signed)
[place] MAIN [facility] SERVICES [address] [place], [place], [address] Phone: [phone]   Fax:  [phone]  Occupational Therapy Treatment  Patient Details  Name: [name] Long MRN: [id number] Date of Birth: 01-Jun-1940 Referring Provider: Lowella Petties, MD   Encounter Date: 11/21/2017  OT End of Session - 11/21/17 0926    Visit Number  17    Number of Visits  36    Date for OT Re-Evaluation  01/08/18    OT Start Time  0800    OT Stop Time  0915    OT Time Calculation (min)  75 min    Activity Tolerance  Patient tolerated treatment well;No increased pain    Behavior During Therapy  WFL for tasks assessed/performed       Past Medical History:  Diagnosis Date  . Atrial fibrillation ([place])   . Hypertension     Past Surgical History:  Procedure Laterality Date  . CESAREAN SECTION      There were no vitals filed for this visit.  Subjective Assessment - 11/21/17 0809    Subjective   Pt presents for OT visit 17/36 to address BLE LE. Pt is accompanied by her daughter, [name]. Pt reports medial L leg  tenderness persists w/ light touch.     Patient is accompained by:  Family member    Pertinent History  contributing comorbidities include: acute CHF, persistent Afib, HTN, Stage III CKD, obesity, class III    Limitations  difficulty walking, fall risk, chronic, progressive BLE swelling and    pain,  impaired transfers and functional mobility, decreased activity tolerance, impaired balance, limitted standing tolerance- dependent position exacerbates BLE swelling and discomfort, impaired stair climbing (difficulty lifting legs), BLE weakness, difficulty fitting LB clothing and street shoes due to progressive LE swelling, impaired basic ADLs, LB dressing, bathing, skin and nail care, diabetic skin infection)    Currently in Pain?  Yes    Pain Score  5     Pain Location  Leg    Pain Orientation  Left    Pain Descriptors / Indicators  Other  (Comment)   tenderness   Pain Onset  In the past 7 days   onset May 2019   Pain Frequency  Other (Comment)   w/ light touch                  OT Treatments/Exercises (OP) - 11/21/17 0001      ADLs   ADL Education Given  Yes             OT Education - 11/21/17 0926    Education Details  Continued skilled Pt/caregiver education  And LE ADL training throughout visit for lymphedema self care/ home program, including compression wrapping, compression garment and device wear/care, lymphatic pumping ther ex, simple self-MLD, and skin care. Discussed progress towards goals.     Person(s) Educated  Patient;Child(ren)    Methods  Explanation;Demonstration    Comprehension  Verbalized understanding;Returned demonstration;Need further instruction          OT Long Term Goals - 11/01/17 0953      OT LONG TERM GOAL #1   Title  Pt modified independent w/ lymphedema precautions/prevention principals and using printed reference to limit LE progression and infection risk.    Baseline  Max A Met 10/25/17    Time  2    Period  Weeks    Status  Achieved      OT LONG TERM GOAL #  2   Title  Lymphedema (LE) management/ self-care: Pt able to apply knee length gradient compression wraps with maximum caregiver assistance daily between OT visits using correct gradient techniques within 2 weeks to achieve optimal limb volume reduction during Intensive Phase of CDT and optimal self-management over time.    Baseline  Dependent Goal MET 11/01/17 for full length compression wraps. CG is independent and uses excellent technique.    Time  2    Period  Weeks    Status  Achieved      OT LONG TERM GOAL #3   Title  Lymphedema (LE) management/ self-care:  Pt to achieve at least 10%  limb volume reduction in BLE  during Intensive Phase CDT to limit LE progression, to decrease infection risk and to improve functional mobility and ambulation essential for optimal ADLs performance.    Baseline   dependent  11/01/17 Goal met and exceeded for RLE.     Time  12    Period  Weeks    Status  Partially Met      OT LONG TERM GOAL #4   Title  Lymphedema (LE) management/ self-care:  Pt to tolerate daily compression garments and/ or HOS devices in keeping w/ prescribed wear regime within 1 week of issue date to restore normal limb shape to reduce tissue density and protein build up leading to progression.    Baseline  Dependent    Time  12    Period  Weeks    Status  New      OT LONG TERM GOAL #5   Title  Lymphedema management/ Self-care: Pt able to don and doff compression garments and/ or devices with maximum assistance using assistive devices and extra times PRN in order to ensure optimal lymphedema management over time to limit progression risk and infection.    Baseline  Dependent    Time  12    Period  Weeks    Status  New      OT LONG TERM GOAL #6   Title  Lymphedema (LE) management/ self-care:  During Management Phase CDT Pt to sustain reduced limb volumes achieved during Intensive Phase CDT at follow up visits during Management Phase CDT within 3% using LE self-care home program components as directed ( simple self MLD, skin care, ther ex and daily/ nightly compression garments/ devices) .    Baseline  dependent    Time  6    Period  Months    Status  New            Plan - 11/21/17 7829    Clinical Impression Statement  Pt continues to demonstrate excellent progress towards all OT goals. Limb volume reductions bilateraly have been better than anticipated, skin condition continues to improve and Pt mobility continues to improve as well for both transfers and ambulation. Body assymetry continues to present balance challenges . Provided skin care bilaterally to decrease skin dryness using low ph castor oil below the knees. Provided MLD to LLE and applied compression wraps to full RLE and half LLE. Good tolerance for treatment. Cont as per POC.    Occupational Profile and client  history currently impacting functional performance  lives with son    as is unable to climb stairs at entrance of daughter's home; plans to return to home in [place] in October 2019; unsure about assistance in [place] as all adults children reside here    Occupational performance deficits (Please refer to evaluation for details):  ADL's;IADL's;Leisure;Social Participation    Rehab Potential  Fair    Current Impairments/barriers affecting progress:  Without max caregiver assistance with LE self-care home program between visits prognosis for improvement is poor    OT Frequency  3x / week    OT Duration  12 weeks   and PRN due to severity of BLE   OT Treatment/Interventions  Self-care/ADL training;Therapeutic exercise;Functional Mobility Training;Manual Therapy;Manual lymph drainage;Therapeutic activities;Coping strategies training;DME and/or AE instruction;Compression bandaging;Other (comment);Patient/family education   fit with custom, BLE compression garments and hours of sleep devices   Plan  CDT includes manual lymphatic drainage (MLD), skin care, compression wrapping and garment fitting, therapeutic exercise and emphasis on Pt and CG self care training    Clinical Decision Making  Multiple treatment options, significant modification of task necessary    Consulted and Agree with Plan of Care  Patient;Family member/caregiver       Patient will benefit from skilled therapeutic intervention in order to improve the following deficits and impairments:  Abnormal gait, Decreased endurance, Decreased skin integrity, Cardiopulmonary status limiting activity, Decreased knowledge of precautions, Impaired perceived functional ability, Decreased activity tolerance, Decreased knowledge of use of DME, Decreased strength, Decreased balance, Decreased mobility, Difficulty walking, Obesity, Decreased range of motion, Increased edema, Pain  Visit Diagnosis: Lymphedema, not elsewhere classified    Problem  List Patient Active Problem List   Diagnosis Date Noted  . AKI (acute kidney injury) ([place])   . Acute CHF (congestive heart failure) ([place]) 08/28/2017  . Hypertensive emergency 08/28/2017  . Obesity, Class III, BMI 40-49.9 (morbid obesity) ([place]) 08/28/2017  . Hypokalemia 08/28/2017  . Acute on chronic combined systolic and diastolic CHF (congestive heart failure) ([place])   . Persistent atrial fibrillation ([place])   . Essential hypertension   . Stage 3 chronic kidney disease ([place])   . Hypercholesterolemia 12/17/2015  . Routine health maintenance 07/23/2015  . 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

## 2017-11-21 NOTE — Therapy (Signed)
[place] MAIN [facility] SERVICES [place], [place], [address] Phone: [phone]   Fax:  [phone]  Occupational Therapy Treatment  Patient Details  Name: [name] Long MRN: [id number] Date of Birth: [birth year] Referring Provider: Lowella Petties, MD   Encounter Date: 11/19/2017    Past Medical History:  Diagnosis Date  . Atrial fibrillation ([place])   . Hypertension     Past Surgical History:  Procedure Laterality Date  . CESAREAN SECTION      There were no vitals filed for this visit.                             OT Long Term Goals - 11/01/17 0953      OT LONG TERM GOAL #1   Title  Pt modified independent w/ lymphedema precautions/prevention principals and using printed reference to limit LE progression and infection risk.    Baseline  Max A Met 10/25/17    Time  2    Period  Weeks    Status  Achieved      OT LONG TERM GOAL #2   Title  Lymphedema (LE) management/ self-care: Pt able to apply knee length gradient compression wraps with maximum caregiver assistance daily between OT visits using correct gradient techniques within 2 weeks to achieve optimal limb volume reduction during Intensive Phase of CDT and optimal self-management over time.    Baseline  Dependent Goal MET 11/01/17 for full length compression wraps. CG is independent and uses excellent technique.    Time  2    Period  Weeks    Status  Achieved      OT LONG TERM GOAL #3   Title  Lymphedema (LE) management/ self-care:  Pt to achieve at least 10%  limb volume reduction in BLE  during Intensive Phase CDT to limit LE progression, to decrease infection risk and to improve functional mobility and ambulation essential for optimal ADLs performance.    Baseline  dependent  11/01/17 Goal met and exceeded for RLE.     Time  12    Period  Weeks    Status  Partially Met      OT LONG TERM GOAL #4   Title  Lymphedema (LE) management/ self-care:   Pt to tolerate daily compression garments and/ or HOS devices in keeping w/ prescribed wear regime within 1 week of issue date to restore normal limb shape to reduce tissue density and protein build up leading to progression.    Baseline  Dependent    Time  12    Period  Weeks    Status  New      OT LONG TERM GOAL #5   Title  Lymphedema management/ Self-care: Pt able to don and doff compression garments and/ or devices with maximum assistance using assistive devices and extra times PRN in order to ensure optimal lymphedema management over time to limit progression risk and infection.    Baseline  Dependent    Time  12    Period  Weeks    Status  New      OT LONG TERM GOAL #6   Title  Lymphedema (LE) management/ self-care:  During Management Phase CDT Pt to sustain reduced limb volumes achieved during Intensive Phase CDT at follow up visits during Management Phase CDT within 3% using LE self-care home program components as directed ( simple self MLD, skin care, ther ex and daily/ nightly  compression garments/ devices) .    Baseline  dependent    Time  6    Period  Months    Status  New              Patient will benefit from skilled therapeutic intervention in order to improve the following deficits and impairments:  Abnormal gait, Decreased endurance, Decreased skin integrity, Cardiopulmonary status limiting activity, Decreased knowledge of precautions, Impaired perceived functional ability, Decreased activity tolerance, Decreased knowledge of use of DME, Decreased strength, Decreased balance, Decreased mobility, Difficulty walking, Obesity, Decreased range of motion, Increased edema, Pain  Visit Diagnosis: Lymphedema, not elsewhere classified    Problem List Patient Active Problem List   Diagnosis Date Noted  . AKI (acute kidney injury) ([place])   . Acute CHF (congestive heart failure) ([place]) 08/28/2017  . Hypertensive emergency 08/28/2017  . Obesity, Class III, BMI 40-49.9  (morbid obesity) ([place]) 08/28/2017  . Hypokalemia 08/28/2017  . Acute on chronic combined systolic and diastolic CHF (congestive heart failure) ([place])   . Persistent atrial fibrillation ([place])   . Essential hypertension   . Stage 3 chronic kidney disease ([place])   . Hypercholesterolemia 12/17/2015  . Routine health maintenance 07/23/2015  . 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

## 2017-11-22 ENCOUNTER — Ambulatory Visit: Payer: Medicare Other | Admitting: Occupational Therapy

## 2017-11-22 DIAGNOSIS — I890 Lymphedema, not elsewhere classified: Secondary | ICD-10-CM | POA: Diagnosis not present

## 2017-11-22 NOTE — Therapy (Signed)
[place] MAIN [facility] SERVICES [address] [place], [place], [address] Phone: [phone]   Fax:  [phone]  Occupational Therapy Treatment  Patient Details  Name: [name] Long MRN: [id number] Date of Birth: 12-Dec-1940 Referring Provider: Lowella Petties, MD   Encounter Date: 11/22/2017  OT End of Session - 11/22/17 0945    Visit Number  18    Number of Visits  36    Date for OT Re-Evaluation  01/08/18    OT Start Time  0800    OT Stop Time  0905    OT Time Calculation (min)  65 min    Activity Tolerance  Patient tolerated treatment well;No increased pain    Behavior During Therapy  WFL for tasks assessed/performed       Past Medical History:  Diagnosis Date  . Atrial fibrillation ([place])   . Hypertension     Past Surgical History:  Procedure Laterality Date  . CESAREAN SECTION      There were no vitals filed for this visit.  Subjective Assessment - 11/22/17 0807    Subjective   Pt presents for OT visit 18/36 to address BLE LE. Pt is accompanied by her daughter, [name] Long. Pt reports tenderness at medial L leg is not quite as bad today.    Patient is accompained by:  Family member    Pertinent History  contributing comorbidities include: acute CHF, persistent Afib, HTN, Stage III CKD, obesity, class III    Limitations  difficulty walking, fall risk, chronic, progressive BLE swelling and    pain,  impaired transfers and functional mobility, decreased activity tolerance, impaired balance, limitted standing tolerance- dependent position exacerbates BLE swelling and discomfort, impaired stair climbing (difficulty lifting legs), BLE weakness, difficulty fitting LB clothing and street shoes due to progressive LE swelling, impaired basic ADLs, LB dressing, bathing, skin and nail care, diabetic skin infection)    Currently in Pain?  No/denies    Pain Onset  In the past 7 days   onset May 2019                  OT  Treatments/Exercises (OP) - 11/22/17 0001      ADLs   ADL Education Given  Yes      Manual Therapy   Manual Therapy  Edema management;Manual Lymphatic Drainage (MLD);Compression Bandaging    Edema Management  skin care w/ castor oil throughout MLD    Manual Lymphatic Drainage (MLD)  MLD in supine to LLE using short neck sequence. deep abdominal pathway and functional inguinal LN. Minimal stroked to medial mid to distal L leg due to soreness and pain with palpation.     Compression Bandaging  LLE knee length compression wrap modified to reduce excessive compression at distal leg to improve comfort and decrease soreness in the area. added 2 layers of  artiflex circumferentially at distal leg and ankle in effort to increase circumference at distal leg to alleviate excess compression at narrowest point of inverted triangle shape. Completed LLE knee length wrap with  1 each shrt stretch wrap , 8 and 10 cm, applied in gradient config. RLE compression wraps as established for thigh high coverage.             OT Education - 11/22/17 0945    Education Details  Continued skilled Pt/caregiver education  And LE ADL training throughout visit for lymphedema self care/ home program, including compression wrapping, compression garment and device wear/care, lymphatic pumping ther ex, simple  self-MLD, and skin care. Discussed progress towards goals.     Person(s) Educated  Patient;Child(ren)    Methods  Explanation;Demonstration    Comprehension  Verbalized understanding;Returned demonstration;Need further instruction          OT Long Term Goals - 11/01/17 0953      OT LONG TERM GOAL #1   Title  Pt modified independent w/ lymphedema precautions/prevention principals and using printed reference to limit LE progression and infection risk.    Baseline  Max A Met 10/25/17    Time  2    Period  Weeks    Status  Achieved      OT LONG TERM GOAL #2   Title  Lymphedema (LE) management/ self-care: Pt able  to apply knee length gradient compression wraps with maximum caregiver assistance daily between OT visits using correct gradient techniques within 2 weeks to achieve optimal limb volume reduction during Intensive Phase of CDT and optimal self-management over time.    Baseline  Dependent Goal MET 11/01/17 for full length compression wraps. CG is independent and uses excellent technique.    Time  2    Period  Weeks    Status  Achieved      OT LONG TERM GOAL #3   Title  Lymphedema (LE) management/ self-care:  Pt to achieve at least 10%  limb volume reduction in BLE  during Intensive Phase CDT to limit LE progression, to decrease infection risk and to improve functional mobility and ambulation essential for optimal ADLs performance.    Baseline  dependent  11/01/17 Goal met and exceeded for RLE.     Time  12    Period  Weeks    Status  Partially Met      OT LONG TERM GOAL #4   Title  Lymphedema (LE) management/ self-care:  Pt to tolerate daily compression garments and/ or HOS devices in keeping w/ prescribed wear regime within 1 week of issue date to restore normal limb shape to reduce tissue density and protein build up leading to progression.    Baseline  Dependent    Time  12    Period  Weeks    Status  New      OT LONG TERM GOAL #5   Title  Lymphedema management/ Self-care: Pt able to don and doff compression garments and/ or devices with maximum assistance using assistive devices and extra times PRN in order to ensure optimal lymphedema management over time to limit progression risk and infection.    Baseline  Dependent    Time  12    Period  Weeks    Status  New      OT LONG TERM GOAL #6   Title  Lymphedema (LE) management/ self-care:  During Management Phase CDT Pt to sustain reduced limb volumes achieved during Intensive Phase CDT at follow up visits during Management Phase CDT within 3% using LE self-care home program components as directed ( simple self MLD, skin care, ther ex and  daily/ nightly compression garments/ devices) .    Baseline  dependent    Time  6    Period  Months    Status  New            Plan - 11/22/17 0946    Clinical Impression Statement  Pt tolerated all aspects of OT today, including MLD to LLE, skin care to LLE and compression wraps bilaterally. Pt progressing well with independence with simple seklf-MLD. Daughter remains comitted and dilligent with  assistance between visits for home program. Cont as per POC. Garment order for LLE progressing wituh DME sending invoice today    Occupational Profile and client history currently impacting functional performance  lives with son    as is unable to climb stairs at entrance of daughter's home; plans to return to home in [place] in October 2019; unsure about assistance in [place] as all adults children reside here    Occupational performance deficits (Please refer to evaluation for details):  ADL's;IADL's;Leisure;Social Participation    Rehab Potential  Fair    Current Impairments/barriers affecting progress:  Without max caregiver assistance with LE self-care home program between visits prognosis for improvement is poor    OT Frequency  3x / week    OT Duration  12 weeks   and PRN due to severity of BLE   OT Treatment/Interventions  Self-care/ADL training;Therapeutic exercise;Functional Mobility Training;Manual Therapy;Manual lymph drainage;Therapeutic activities;Coping strategies training;DME and/or AE instruction;Compression bandaging;Other (comment);Patient/family education   fit with custom, BLE compression garments and hours of sleep devices   Plan  CDT includes manual lymphatic drainage (MLD), skin care, compression wrapping and garment fitting, therapeutic exercise and emphasis on Pt and CG self care training    Clinical Decision Making  Multiple treatment options, significant modification of task necessary    Consulted and Agree with Plan of Care  Patient;Family member/caregiver        Patient will benefit from skilled therapeutic intervention in order to improve the following deficits and impairments:  Abnormal gait, Decreased endurance, Decreased skin integrity, Cardiopulmonary status limiting activity, Decreased knowledge of precautions, Impaired perceived functional ability, Decreased activity tolerance, Decreased knowledge of use of DME, Decreased strength, Decreased balance, Decreased mobility, Difficulty walking, Obesity, Decreased range of motion, Increased edema, Pain  Visit Diagnosis: Lymphedema, not elsewhere classified    Problem List Patient Active Problem List   Diagnosis Date Noted  . AKI (acute kidney injury) ([place])   . Acute CHF (congestive heart failure) ([place]) 08/28/2017  . Hypertensive emergency 08/28/2017  . Obesity, Class III, BMI 40-49.9 (morbid obesity) ([place]) 08/28/2017  . Hypokalemia 08/28/2017  . Acute on chronic combined systolic and diastolic CHF (congestive heart failure) ([place])   . Persistent atrial fibrillation ([place])   . Essential hypertension   . Stage 3 chronic kidney disease ([place])   . Hypercholesterolemia 12/17/2015  . Routine health maintenance 07/23/2015  . 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 [place], [place], [address] Phone: [phone]   Fax:  [phone]  Name: [name] Long MRN: [id number] Date of Birth: [birth year]

## 2017-11-26 ENCOUNTER — Ambulatory Visit: Payer: Medicare Other | Admitting: Occupational Therapy

## 2017-11-26 DIAGNOSIS — I890 Lymphedema, not elsewhere classified: Secondary | ICD-10-CM

## 2017-11-26 NOTE — Therapy (Signed)
[place] MAIN [facility] SERVICES [address] [place], [place], [address] Phone: [phone]   Fax:  [phone]  Occupational Therapy Treatment  Patient Details  Name: [name] Long MRN: [id number] Date of Birth: 04/02/1940 Referring Provider: Lowella Petties, MD   Encounter Date: 11/26/2017  OT End of Session - 11/26/17 0928    Visit Number  19    Number of Visits  36    Date for OT Re-Evaluation  01/08/18    OT Start Time  0805    OT Stop Time  0905    OT Time Calculation (min)  60 min    Activity Tolerance  Patient tolerated treatment well;No increased pain    Behavior During Therapy  WFL for tasks assessed/performed       Past Medical History:  Diagnosis Date  . Atrial fibrillation ([place])   . Hypertension     Past Surgical History:  Procedure Laterality Date  . CESAREAN SECTION      There were no vitals filed for this visit.  Subjective Assessment - 11/26/17 0925    Subjective   Pt presents for OT visit 19/36 to address BLE LE. Pt is accompanied by her daughter, [name]. Pt has no new complaints. She reorts she had no difficulty tolerating compression over the weekend. [name] reports, "Mom worked on her massage over the weekend."    Patient is accompained by:  Family member    Pertinent History  contributing comorbidities include: acute CHF, persistent Afib, HTN, Stage III CKD, obesity, class III    Limitations  difficulty walking, fall risk, chronic, progressive BLE swelling and    pain,  impaired transfers and functional mobility, decreased activity tolerance, impaired balance, limitted standing tolerance- dependent position exacerbates BLE swelling and discomfort, impaired stair climbing (difficulty lifting legs), BLE weakness, difficulty fitting LB clothing and street shoes due to progressive LE swelling, impaired basic ADLs, LB dressing, bathing, skin and nail care, diabetic skin infection)    Pain Onset  In the past 7 days   onset  May 2019                  OT Treatments/Exercises (OP) - 11/26/17 0001      ADLs   ADL Education Given  Yes      Manual Therapy   Manual Therapy  Edema management;Manual Lymphatic Drainage (MLD);Compression Bandaging    Edema Management  skin care w/ castor oil throughout MLD    Manual Lymphatic Drainage (MLD)  MLD in supine to LLE using short neck sequence. deep abdominal pathway and functional inguinal LN. Minimal stroked to medial mid to distal L leg due to soreness and pain with palpation.     Compression Bandaging  LLE knee length compression wrap modified to reduce excessive compression at distal leg to improve comfort and decrease soreness in the area. added 2 layers of  artiflex circumferentially at distal leg and ankle in effort to increase circumference at distal leg to alleviate excess compression at narrowest point of inverted triangle shape. Completed LLE knee length wrap with  1 each shrt stretch wrap , 8 and 10 cm, applied in gradient config. RLE compression wraps as established for thigh high coverage.             OT Education - 11/26/17 [phone]    Education Details  Continued skilled Pt/caregiver education  And LE ADL training throughout visit for lymphedema self care/ home program, including compression wrapping, compression garment and device wear/care,  lymphatic pumping ther ex, simple self-MLD, and skin care. Discussed progress towards goals.     Person(s) Educated  Patient;Child(ren)    Methods  Explanation;Demonstration    Comprehension  Verbalized understanding;Returned demonstration;Need further instruction          OT Long Term Goals - 11/01/17 0953      OT LONG TERM GOAL #1   Title  Pt modified independent w/ lymphedema precautions/prevention principals and using printed reference to limit LE progression and infection risk.    Baseline  Max A Met 10/25/17    Time  2    Period  Weeks    Status  Achieved      OT LONG TERM GOAL #2   Title   Lymphedema (LE) management/ self-care: Pt able to apply knee length gradient compression wraps with maximum caregiver assistance daily between OT visits using correct gradient techniques within 2 weeks to achieve optimal limb volume reduction during Intensive Phase of CDT and optimal self-management over time.    Baseline  Dependent Goal MET 11/01/17 for full length compression wraps. CG is independent and uses excellent technique.    Time  2    Period  Weeks    Status  Achieved      OT LONG TERM GOAL #3   Title  Lymphedema (LE) management/ self-care:  Pt to achieve at least 10%  limb volume reduction in BLE  during Intensive Phase CDT to limit LE progression, to decrease infection risk and to improve functional mobility and ambulation essential for optimal ADLs performance.    Baseline  dependent  11/01/17 Goal met and exceeded for RLE.     Time  12    Period  Weeks    Status  Partially Met      OT LONG TERM GOAL #4   Title  Lymphedema (LE) management/ self-care:  Pt to tolerate daily compression garments and/ or HOS devices in keeping w/ prescribed wear regime within 1 week of issue date to restore normal limb shape to reduce tissue density and protein build up leading to progression.    Baseline  Dependent    Time  12    Period  Weeks    Status  New      OT LONG TERM GOAL #5   Title  Lymphedema management/ Self-care: Pt able to don and doff compression garments and/ or devices with maximum assistance using assistive devices and extra times PRN in order to ensure optimal lymphedema management over time to limit progression risk and infection.    Baseline  Dependent    Time  12    Period  Weeks    Status  New      OT LONG TERM GOAL #6   Title  Lymphedema (LE) management/ self-care:  During Management Phase CDT Pt to sustain reduced limb volumes achieved during Intensive Phase CDT at follow up visits during Management Phase CDT within 3% using LE self-care home program components as  directed ( simple self MLD, skin care, ther ex and daily/ nightly compression garments/ devices) .    Baseline  dependent    Time  6    Period  Months    Status  New            Plan - 11/26/17 0929    Clinical Impression Statement  Pt demonstrates habituation of LE self care as evidenced by performing simple self-MLD between clinical sessions.. Daughter diligently assists her with compression wraps as directed  daily between  visits. Pt tolerated MLD, skin care and compression in clinic without difficulty today. Discussed her ability to contribute towards compression garment order. Cont as per POC.    Occupational Profile and client history currently impacting functional performance  lives with son    as is unable to climb stairs at entrance of daughter's home; plans to return to home in [place] in October 2019; unsure about assistance in [place] as all adults children reside here    Occupational performance deficits (Please refer to evaluation for details):  ADL's;IADL's;Leisure;Social Participation    Rehab Potential  Fair    Current Impairments/barriers affecting progress:  Without max caregiver assistance with LE self-care home program between visits prognosis for improvement is poor    OT Frequency  3x / week    OT Duration  12 weeks   and PRN due to severity of BLE   OT Treatment/Interventions  Self-care/ADL training;Therapeutic exercise;Functional Mobility Training;Manual Therapy;Manual lymph drainage;Therapeutic activities;Coping strategies training;DME and/or AE instruction;Compression bandaging;Other (comment);Patient/family education   fit with custom, BLE compression garments and hours of sleep devices   Plan  CDT includes manual lymphatic drainage (MLD), skin care, compression wrapping and garment fitting, therapeutic exercise and emphasis on Pt and CG self care training    Clinical Decision Making  Multiple treatment options, significant modification of task necessary     Consulted and Agree with Plan of Care  Patient;Family member/caregiver       Patient will benefit from skilled therapeutic intervention in order to improve the following deficits and impairments:  Abnormal gait, Decreased endurance, Decreased skin integrity, Cardiopulmonary status limiting activity, Decreased knowledge of precautions, Impaired perceived functional ability, Decreased activity tolerance, Decreased knowledge of use of DME, Decreased strength, Decreased balance, Decreased mobility, Difficulty walking, Obesity, Decreased range of motion, Increased edema, Pain  Visit Diagnosis: Lymphedema, not elsewhere classified    Problem List Patient Active Problem List   Diagnosis Date Noted  . AKI (acute kidney injury) ([place])   . Acute CHF (congestive heart failure) ([place]) 08/28/2017  . Hypertensive emergency 08/28/2017  . Obesity, Class III, BMI 40-49.9 (morbid obesity) ([place]) 08/28/2017  . Hypokalemia 08/28/2017  . Acute on chronic combined systolic and diastolic CHF (congestive heart failure) ([place])   . Persistent atrial fibrillation ([place])   . Essential hypertension   . Stage 3 chronic kidney disease ([place])   . Hypercholesterolemia 12/17/2015  . Routine health maintenance 07/23/2015  . 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

## 2017-11-28 ENCOUNTER — Ambulatory Visit: Payer: Medicare Other | Admitting: Occupational Therapy

## 2017-11-28 DIAGNOSIS — I890 Lymphedema, not elsewhere classified: Secondary | ICD-10-CM | POA: Diagnosis not present

## 2017-11-28 NOTE — Therapy (Signed)
[place] MAIN [facility] SERVICES [address] [place], [place], [address] Phone: [phone]   Fax:  [phone]  Occupational Therapy Treatment Note and Progress Report  Patient Details  Name: [name] Long MRN: [id number] Date of Birth: Jul 07, 1940 Referring Provider: Lowella Petties, MD   Encounter Date: 11/28/2017  OT End of Session - 11/28/17 0951    Visit Number  20    Number of Visits  36    Date for OT Re-Evaluation  01/08/18    OT Start Time  0800    OT Stop Time  0905    OT Time Calculation (min)  65 min    Activity Tolerance  Patient tolerated treatment well;No increased pain    Behavior During Therapy  WFL for tasks assessed/performed       Past Medical History:  Diagnosis Date  . Atrial fibrillation ([place])   . Hypertension     Past Surgical History:  Procedure Laterality Date  . CESAREAN SECTION      There were no vitals filed for this visit.  Subjective Assessment - 11/28/17 0949    Subjective   Pt presents for OT visit 20/36 to address BLE LE. Pt is accompanied by her daughter, [name]. Pt states she would like to contribute $300 towards the cost of her custom compression garments. Pt agrees to send funds directly to DME vendor.    Patient is accompained by:  Family member    Pertinent History  contributing comorbidities include: acute CHF, persistent Afib, HTN, Stage III CKD, obesity, class III    Limitations  difficulty walking, fall risk, chronic, progressive BLE swelling and    pain,  impaired transfers and functional mobility, decreased activity tolerance, impaired balance, limitted standing tolerance- dependent position exacerbates BLE swelling and discomfort, impaired stair climbing (difficulty lifting legs), BLE weakness, difficulty fitting LB clothing and street shoes due to progressive LE swelling, impaired basic ADLs, LB dressing, bathing, skin and nail care, diabetic skin infection)    Pain Onset  In the past 7 days    onset May 2019                  OT Treatments/Exercises (OP) - 11/28/17 0001      ADLs   ADL Education Given  Yes      Manual Therapy   Manual Therapy  Edema management;Manual Lymphatic Drainage (MLD);Compression Bandaging    Edema Management  skin care w/ castor oil throughout MLD    Manual Lymphatic Drainage (MLD)  MLD in supine to LLE using short neck sequence. deep abdominal pathway and functional inguinal LN. Minimal stroked to medial mid to distal L leg due to soreness and pain with palpation.     Compression Bandaging  LLE knee length compression wrap modified to reduce excessive compression at distal leg to improve comfort and decrease soreness in the area. added 2 layers of  artiflex circumferentially at distal leg and ankle in effort to increase circumference at distal leg to alleviate excess compression at narrowest point of inverted triangle shape. Completed LLE knee length wrap with  1 each shrt stretch wrap , 8 and 10 cm, applied in gradient config. RLE compression wraps as established for thigh high coverage.             OT Education - 11/28/17 0951    Education Details  Continued skilled Pt/caregiver education  And LE ADL training throughout visit for lymphedema self care/ home program, including compression wrapping, compression garment  and device wear/care, lymphatic pumping ther ex, simple self-MLD, and skin care. Discussed progress towards goals.     Person(s) Educated  Patient;Child(ren)    Methods  Explanation;Demonstration    Comprehension  Verbalized understanding;Returned demonstration;Need further instruction          OT Long Term Goals - 11/28/17 0951      OT LONG TERM GOAL #1   Title  Pt modified independent w/ lymphedema precautions/prevention principals and using printed reference to limit LE progression and infection risk.    Baseline  Max A Met 10/25/17    Time  2    Period  Weeks    Status  Achieved      OT LONG TERM GOAL #2    Title  Lymphedema (LE) management/ self-care: Pt able to apply knee length gradient compression wraps with maximum caregiver assistance daily between OT visits using correct gradient techniques within 2 weeks to achieve optimal limb volume reduction during Intensive Phase of CDT and optimal self-management over time.    Baseline  Dependent Goal MET 11/01/17 for full length compression wraps. CG is independent and uses excellent technique.    Time  2    Period  Weeks    Status  Achieved      OT LONG TERM GOAL #3   Title  Lymphedema (LE) management/ self-care:  Pt to achieve at least 10%  limb volume reduction in BLE  during Intensive Phase CDT to limit LE progression, to decrease infection risk and to improve functional mobility and ambulation essential for optimal ADLs performance.    Baseline  dependent  11/01/17 Goal met and exceeded for RLE.  11/27/17 : LLE well reduced below the knee.    Time  12    Period  Weeks    Status  Partially Met      OT LONG TERM GOAL #4   Title  Lymphedema (LE) management/ self-care:  Pt to tolerate daily compression garments and/ or HOS devices in keeping w/ prescribed wear regime within 1 week of issue date to restore normal limb shape to reduce tissue density and protein build up leading to progression.    Baseline  Dependent    Time  12    Period  Weeks    Status  New      OT LONG TERM GOAL #5   Title  Lymphedema management/ Self-care: Pt able to don and doff compression garments and/ or devices with maximum assistance using assistive devices and extra times PRN in order to ensure optimal lymphedema management over time to limit progression risk and infection.    Baseline  Dependent    Time  12    Period  Weeks    Status  New      OT LONG TERM GOAL #6   Title  Lymphedema (LE) management/ self-care:  During Management Phase CDT Pt to sustain reduced limb volumes achieved during Intensive Phase CDT at follow up visits during Management Phase CDT within 3%  using LE self-care home program components as directed ( simple self MLD, skin care, ther ex and daily/ nightly compression garments/ devices) .    Baseline  dependent    Time  6    Period  Months    Status  New            Plan - 11/28/17 9326    Clinical Impression Statement  Pt tolerated LLE MLD and skin care, LE self-care edu, and BLE compression therapy in clinic today  with no difficulty. She denies SOB and any difficulty walking with thigh length wraps to RLE and knee length wraps to LLE. Pt and daughter report functional improvements  with edema reduction as OT hs continued, including improved independence with toilet and car transfers, improved   ability to left legs to get into bed and to perform bed mobility, improved knee and ankle AROM ror positioning in bed, improved ability to fit LB clothing and peferred street choes, marked improvement in safe ambulation, increased weight loss with improving movbility and ability to exercise. Pt is 100 compliant w/ LE self care and family members continue to diligently assist with all aspets of home program PRN.  RLE custom compression garment is rdered and LLE is partially decongested. Cont as per POC. Pt demonstrates dramatic progress towards all goals.    Occupational Profile and client history currently impacting functional performance  lives with son    as is unable to climb stairs at entrance of daughter's home; plans to return to home in [place] in October 2019; unsure about assistance in [place] as all adults children reside here    Occupational performance deficits (Please refer to evaluation for details):  ADL's;IADL's;Leisure;Social Participation    Rehab Potential  Fair    Current Impairments/barriers affecting progress:  Without max caregiver assistance with LE self-care home program between visits prognosis for improvement is poor    OT Frequency  3x / week    OT Duration  12 weeks   and PRN due to severity of BLE   OT  Treatment/Interventions  Self-care/ADL training;Therapeutic exercise;Functional Mobility Training;Manual Therapy;Manual lymph drainage;Therapeutic activities;Coping strategies training;DME and/or AE instruction;Compression bandaging;Other (comment);Patient/family education   fit with custom, BLE compression garments and hours of sleep devices   Plan  CDT includes manual lymphatic drainage (MLD), skin care, compression wrapping and garment fitting, therapeutic exercise and emphasis on Pt and CG self care training    Clinical Decision Making  Multiple treatment options, significant modification of task necessary    Consulted and Agree with Plan of Care  Patient;Family member/caregiver       Patient will benefit from skilled therapeutic intervention in order to improve the following deficits and impairments:  Abnormal gait, Decreased endurance, Decreased skin integrity, Cardiopulmonary status limiting activity, Decreased knowledge of precautions, Impaired perceived functional ability, Decreased activity tolerance, Decreased knowledge of use of DME, Decreased strength, Decreased balance, Decreased mobility, Difficulty walking, Obesity, Decreased range of motion, Increased edema, Pain  Visit Diagnosis: Lymphedema, not elsewhere classified    Problem List Patient Active Problem List   Diagnosis Date Noted  . AKI (acute kidney injury) ([place])   . Acute CHF (congestive heart failure) ([place]) 08/28/2017  . Hypertensive emergency 08/28/2017  . Obesity, Class III, BMI 40-49.9 (morbid obesity) ([place]) 08/28/2017  . Hypokalemia 08/28/2017  . Acute on chronic combined systolic and diastolic CHF (congestive heart failure) ([place])   . Persistent atrial fibrillation ([place])   . Essential hypertension   . Stage 3 chronic kidney disease ([place])   . Hypercholesterolemia 12/17/2015  . Routine health maintenance 07/23/2015  . 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

## 2017-11-29 ENCOUNTER — Ambulatory Visit: Payer: Medicare Other | Admitting: Occupational Therapy

## 2017-11-29 DIAGNOSIS — I890 Lymphedema, not elsewhere classified: Secondary | ICD-10-CM | POA: Diagnosis not present

## 2017-11-29 NOTE — Therapy (Signed)
[place] MAIN [facility] SERVICES [address] [place], [place], [address] Phone: [phone]   Fax:  [phone]  Occupational Therapy Treatment  Patient Details  Name: [name] Long MRN: [id number] Date of Birth: [birth year] Referring Provider: Lowella Petties, MD   Encounter Date: 11/29/2017  OT End of Session - 11/29/17 1100    Visit Number  21    Number of Visits  36    Date for OT Re-Evaluation  01/08/18    OT Start Time  0800    OT Stop Time  0907    OT Time Calculation (min)  67 min    Activity Tolerance  Patient tolerated treatment well;No increased pain    Behavior During Therapy  WFL for tasks assessed/performed       Past Medical History:  Diagnosis Date  . Atrial fibrillation ([place])   . Hypertension     Past Surgical History:  Procedure Laterality Date  . CESAREAN SECTION      There were no vitals filed for this visit.  Subjective Assessment - 11/29/17 1052    Subjective   Pt presents for OT visit 21/36 to address BLE LE. Pt is accompanied by her daughter. Pt has no complaints today.     Patient is accompained by:  Family member    Pertinent History  contributing comorbidities include: acute CHF, persistent Afib, HTN, Stage III CKD, obesity, class III    Limitations  difficulty walking, fall risk, chronic, progressive BLE swelling and    pain,  impaired transfers and functional mobility, decreased activity tolerance, impaired balance, limitted standing tolerance- dependent position exacerbates BLE swelling and discomfort, impaired stair climbing (difficulty lifting legs), BLE weakness, difficulty fitting LB clothing and street shoes due to progressive LE swelling, impaired basic ADLs, LB dressing, bathing, skin and nail care, diabetic skin infection)    Pain Onset  In the past 7 days   onset May 2019                  OT Treatments/Exercises (OP) - 11/29/17 0001      ADLs   ADL Education Given  Yes      Manual Therapy   Manual Therapy  Edema management;Manual Lymphatic Drainage (MLD);Compression Bandaging    Edema Management  skin care w/ castor oil throughout MLD    Manual Lymphatic Drainage (MLD)  MLD in supine to LLE using short neck sequence. deep abdominal pathway and functional inguinal LN. Minimal stroked to medial mid to distal L leg due to soreness and pain with palpation.     Compression Bandaging  LLE knee length compression wrap modified to reduce excessive compression at distal leg to improve comfort and decrease soreness in the area. added 2 layers of  artiflex circumferentially at distal leg and ankle in effort to increase circumference at distal leg to alleviate excess compression at narrowest point of inverted triangle shape. Completed LLE knee length wrap with  1 each shrt stretch wrap , 8 and 10 cm, applied in gradient config. RLE compression wraps as established for thigh high coverage.             OT Education - 11/29/17 1100    Education Details  Continued skilled Pt/caregiver education  And LE ADL training throughout visit for lymphedema self care/ home program, including compression wrapping, compression garment and device wear/care, lymphatic pumping ther ex, simple self-MLD, and skin care. Discussed progress towards goals.     Person(s) Educated  Patient;Child(ren)  Methods  Explanation;Demonstration    Comprehension  Verbalized understanding;Returned demonstration;Need further instruction          OT Long Term Goals - 11/28/17 0951      OT LONG TERM GOAL #1   Title  Pt modified independent w/ lymphedema precautions/prevention principals and using printed reference to limit LE progression and infection risk.    Baseline  Max A Met 10/25/17    Time  2    Period  Weeks    Status  Achieved      OT LONG TERM GOAL #2   Title  Lymphedema (LE) management/ self-care: Pt able to apply knee length gradient compression wraps with maximum caregiver assistance daily  between OT visits using correct gradient techniques within 2 weeks to achieve optimal limb volume reduction during Intensive Phase of CDT and optimal self-management over time.    Baseline  Dependent Goal MET 11/01/17 for full length compression wraps. CG is independent and uses excellent technique.    Time  2    Period  Weeks    Status  Achieved      OT LONG TERM GOAL #3   Title  Lymphedema (LE) management/ self-care:  Pt to achieve at least 10%  limb volume reduction in BLE  during Intensive Phase CDT to limit LE progression, to decrease infection risk and to improve functional mobility and ambulation essential for optimal ADLs performance.    Baseline  dependent  11/01/17 Goal met and exceeded for RLE.  11/27/17 : LLE well reduced below the knee.    Time  12    Period  Weeks    Status  Partially Met      OT LONG TERM GOAL #4   Title  Lymphedema (LE) management/ self-care:  Pt to tolerate daily compression garments and/ or HOS devices in keeping w/ prescribed wear regime within 1 week of issue date to restore normal limb shape to reduce tissue density and protein build up leading to progression.    Baseline  Dependent    Time  12    Period  Weeks    Status  New      OT LONG TERM GOAL #5   Title  Lymphedema management/ Self-care: Pt able to don and doff compression garments and/ or devices with maximum assistance using assistive devices and extra times PRN in order to ensure optimal lymphedema management over time to limit progression risk and infection.    Baseline  Dependent    Time  12    Period  Weeks    Status  New      OT LONG TERM GOAL #6   Title  Lymphedema (LE) management/ self-care:  During Management Phase CDT Pt to sustain reduced limb volumes achieved during Intensive Phase CDT at follow up visits during Management Phase CDT within 3% using LE self-care home program components as directed ( simple self MLD, skin care, ther ex and daily/ nightly compression garments/ devices) .     Baseline  dependent    Time  6    Period  Months    Status  New            Plan - 11/29/17 1101    Clinical Impression Statement  Provided MLD and skin are t9o LLE. Provied BLE compression wraps as established. Pt tolerated treatment well. Shwe slept through about 1/3 of session. Family member will call DME provider today w/ CC # to complete garment order for RLE.    Occupational  Profile and client history currently impacting functional performance  lives with son    as is unable to climb stairs at entrance of daughter's home; plans to return to home in [place] in October 2019; unsure about assistance in [place] as all adults children reside here    Occupational performance deficits (Please refer to evaluation for details):  ADL's;IADL's;Leisure;Social Participation    Rehab Potential  Fair    Current Impairments/barriers affecting progress:  Without max caregiver assistance with LE self-care home program between visits prognosis for improvement is poor    OT Frequency  3x / week    OT Duration  12 weeks   and PRN due to severity of BLE   OT Treatment/Interventions  Self-care/ADL training;Therapeutic exercise;Functional Mobility Training;Manual Therapy;Manual lymph drainage;Therapeutic activities;Coping strategies training;DME and/or AE instruction;Compression bandaging;Other (comment);Patient/family education   fit with custom, BLE compression garments and hours of sleep devices   Plan  CDT includes manual lymphatic drainage (MLD), skin care, compression wrapping and garment fitting, therapeutic exercise and emphasis on Pt and CG self care training    Clinical Decision Making  Multiple treatment options, significant modification of task necessary    Consulted and Agree with Plan of Care  Patient;Family member/caregiver       Patient will benefit from skilled therapeutic intervention in order to improve the following deficits and impairments:  Abnormal gait, Decreased endurance,  Decreased skin integrity, Cardiopulmonary status limiting activity, Decreased knowledge of precautions, Impaired perceived functional ability, Decreased activity tolerance, Decreased knowledge of use of DME, Decreased strength, Decreased balance, Decreased mobility, Difficulty walking, Obesity, Decreased range of motion, Increased edema, Pain  Visit Diagnosis: Lymphedema, not elsewhere classified    Problem List Patient Active Problem List   Diagnosis Date Noted  . AKI (acute kidney injury) ([place])   . Acute CHF (congestive heart failure) ([place]) 08/28/2017  . Hypertensive emergency 08/28/2017  . Obesity, Class III, BMI 40-49.9 (morbid obesity) ([place]) 08/28/2017  . Hypokalemia 08/28/2017  . Acute on chronic combined systolic and diastolic CHF (congestive heart failure) ([place])   . Persistent atrial fibrillation ([place])   . Essential hypertension   . Stage 3 chronic kidney disease ([place])   . Hypercholesterolemia 12/17/2015  . Routine health maintenance 07/23/2015  . 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 [place], [place], [address] Phone: [phone]   Fax:  [phone]  Name: [name] Long MRN: [id number] Date of Birth: 1940-09-06

## 2017-12-03 ENCOUNTER — Ambulatory Visit: Payer: Medicare Other | Admitting: Occupational Therapy

## 2017-12-03 DIAGNOSIS — I890 Lymphedema, not elsewhere classified: Secondary | ICD-10-CM | POA: Diagnosis not present

## 2017-12-03 NOTE — Therapy (Signed)
[place] MAIN [facility] SERVICES [address] [place], [place], [address] Phone: [phone]   Fax:  [phone]  Occupational Therapy Treatment  Patient Details  Name: [name] Long MRN: [id number] Date of Birth: [birth year] No data recorded  Encounter Date: 12/03/2017  OT End of Session - 12/03/17 1353    Visit Number  22    Number of Visits  36    Date for OT Re-Evaluation  01/08/18    OT Start Time  0800    OT Stop Time  0905    OT Time Calculation (min)  65 min    Activity Tolerance  Patient tolerated treatment well;No increased pain    Behavior During Therapy  WFL for tasks assessed/performed       Past Medical History:  Diagnosis Date  . Atrial fibrillation ([place])   . Hypertension     Past Surgical History:  Procedure Laterality Date  . CESAREAN SECTION      There were no vitals filed for this visit.  Subjective Assessment - 12/03/17 0810    Subjective   Pt presents for OT visit 20/36 to address BLE LE. Pt is accompanied by her daughter. Pt has no complaints."...no problems this weekend."     Patient is accompained by:  Family member    Pertinent History  contributing comorbidities include: acute CHF, persistent Afib, HTN, Stage III CKD, obesity, class III    Limitations  difficulty walking, fall risk, chronic, progressive BLE swelling and    pain,  impaired transfers and functional mobility, decreased activity tolerance, impaired balance, limitted standing tolerance- dependent position exacerbates BLE swelling and discomfort, impaired stair climbing (difficulty lifting legs), BLE weakness, difficulty fitting LB clothing and street shoes due to progressive LE swelling, impaired basic ADLs, LB dressing, bathing, skin and nail care, diabetic skin infection)    Pain Onset  In the past 7 days   onset May 2019                  OT Treatments/Exercises (OP) - 12/03/17 0001      ADLs   ADL Education Given  Yes      Manual  Therapy   Manual Therapy  Edema management;Manual Lymphatic Drainage (MLD);Compression Bandaging    Edema Management  skin care w/ castor oil throughout MLD    Manual Lymphatic Drainage (MLD)  MLD in supine to LLE using short neck sequence. deep abdominal pathway and functional inguinal LN. Minimal stroked to medial mid to distal L leg due to soreness and pain with palpation.     Compression Bandaging  LLE knee length compression wrap modified to reduce excessive compression at distal leg to improve comfort and decrease soreness in the area. added 2 layers of  artiflex circumferentially at distal leg and ankle in effort to increase circumference at distal leg to alleviate excess compression at narrowest point of inverted triangle shape. Completed LLE knee length wrap with  1 each shrt stretch wrap , 8 and 10 cm, applied in gradient config. RLE compression wraps as established for thigh high coverage.             OT Education - 12/03/17 4967    Education Details  Discussed transition to Management Phase of CDT. Discussed progress towards goals and plan to decrease OT frequency after RLE garment fitted.    Person(s) Educated  Patient;Child(ren)    Methods  Explanation;Demonstration    Comprehension  Verbalized understanding;Returned demonstration;Need further instruction  OT Long Term Goals - 12/03/17 0816      OT LONG TERM GOAL #1   Title  Pt modified independent w/ lymphedema precautions/prevention principals and using printed reference to limit LE progression and infection risk.  (Pended)     Baseline  Max A Met 10/25/17  (Pended)     Time  2  (Pended)     Period  Weeks  (Pended)     Status  Achieved  (Pended)       OT LONG TERM GOAL #2   Title  Lymphedema (LE) management/ self-care: Pt able to apply knee length gradient compression wraps with maximum caregiver assistance daily between OT visits using correct gradient techniques within 2 weeks to achieve optimal limb volume  reduction during Intensive Phase of CDT and optimal self-management over time.  (Pended)     Baseline  Dependent Goal MET 11/01/17 for full length compression wraps. CG is independent and uses excellent technique.  (Pended)     Time  2  (Pended)     Period  Weeks  (Pended)     Status  Achieved  (Pended)       OT LONG TERM GOAL #3   Title  Lymphedema (LE) management/ self-care:  Pt to achieve at least 10%  limb volume reduction in BLE  during Intensive Phase CDT to limit LE progression, to decrease infection risk and to improve functional mobility and ambulation essential for optimal ADLs performance.  (Pended)     Baseline  dependent  11/01/17 Goal met and exceeded for RLE.  11/27/17 : LLE well reduced below the knee.  (Pended)     Time  12  (Pended)     Period  Weeks  (Pended)     Status  Partially Met  (Pended)       OT LONG TERM GOAL #4   Title  Lymphedema (LE) management/ self-care:  Pt to tolerate daily compression garments and/ or HOS devices in keeping w/ prescribed wear regime within 1 week of issue date to restore normal limb shape to reduce tissue density and protein build up leading to progression.  (Pended)     Baseline  Dependent  (Pended)     Time  12  (Pended)     Period  Weeks  (Pended)     Status  New  (Pended)       OT LONG TERM GOAL #5   Title  Lymphedema management/ Self-care: Pt able to don and doff compression garments and/ or devices with maximum assistance using assistive devices and extra times PRN in order to ensure optimal lymphedema management over time to limit progression risk and infection.  (Pended)     Baseline  Dependent  (Pended)     Time  12  (Pended)     Period  Weeks  (Pended)     Status  New  (Pended)       OT LONG TERM GOAL #6   Title  Lymphedema (LE) management/ self-care:  During Management Phase CDT Pt to sustain reduced limb volumes achieved during Intensive Phase CDT at follow up visits during Management Phase CDT within 3% using LE self-care home  program components as directed ( simple self MLD, skin care, ther ex and daily/ nightly compression garments/ devices) .  (Pended)     Baseline  dependent  (Pended)     Time  6  (Pended)     Period  Months  (Pended)     Status  New  (  Pended)             Plan - 12/03/17 1354    Clinical Impression Statement  Pt tolerated BLE skin care, MLD to LLE and BLE compression wrapping as established. Provided Pt and CG edu re self-management pghase of CDT as Pt prepares for that transition. Cont as perr POC.    Occupational Profile and client history currently impacting functional performance  lives with son    as is unable to climb stairs at entrance of daughter's home; plans to return to home in [place] in October 2019; unsure about assistance in [place] as all adults children reside here    Occupational performance deficits (Please refer to evaluation for details):  ADL's;IADL's;Leisure;Social Participation    Rehab Potential  Fair    Current Impairments/barriers affecting progress:  Without max caregiver assistance with LE self-care home program between visits prognosis for improvement is poor    OT Frequency  3x / week    OT Duration  12 weeks   and PRN due to severity of BLE   OT Treatment/Interventions  Self-care/ADL training;Therapeutic exercise;Functional Mobility Training;Manual Therapy;Manual lymph drainage;Therapeutic activities;Coping strategies training;DME and/or AE instruction;Compression bandaging;Other (comment);Patient/family education   fit with custom, BLE compression garments and hours of sleep devices   Plan  CDT includes manual lymphatic drainage (MLD), skin care, compression wrapping and garment fitting, therapeutic exercise and emphasis on Pt and CG self care training    Clinical Decision Making  Multiple treatment options, significant modification of task necessary    Consulted and Agree with Plan of Care  Patient;Family member/caregiver       Patient Long benefit from  skilled therapeutic intervention in order to improve the following deficits and impairments:  Abnormal gait, Decreased endurance, Decreased skin integrity, Cardiopulmonary status limiting activity, Decreased knowledge of precautions, Impaired perceived functional ability, Decreased activity tolerance, Decreased knowledge of use of DME, Decreased strength, Decreased balance, Decreased mobility, Difficulty walking, Obesity, Decreased range of motion, Increased edema, Pain  Visit Diagnosis: Lymphedema, not elsewhere classified    Problem List Patient Active Problem List   Diagnosis Date Noted  . AKI (acute kidney injury) ([place])   . Acute CHF (congestive heart failure) ([place]) 08/28/2017  . Hypertensive emergency 08/28/2017  . Obesity, Class III, BMI 40-49.9 (morbid obesity) ([place]) 08/28/2017  . Hypokalemia 08/28/2017  . Acute on chronic combined systolic and diastolic CHF (congestive heart failure) ([place])   . Persistent atrial fibrillation ([place])   . Essential hypertension   . Stage 3 chronic kidney disease ([place])   . Hypercholesterolemia 12/17/2015  . Routine health maintenance 07/23/2015  . 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 MAIN [facility] SERVICES [address] [place], [place], [address] Phone: [phone]   Fax:  [phone]  Name: [name] Long MRN: [id number] Date of Birth: 22-Nov-1940

## 2017-12-05 ENCOUNTER — Ambulatory Visit: Payer: Medicare Other | Attending: Orthopedic Surgery | Admitting: Occupational Therapy

## 2017-12-05 DIAGNOSIS — I890 Lymphedema, not elsewhere classified: Secondary | ICD-10-CM | POA: Insufficient documentation

## 2017-12-05 NOTE — Therapy (Signed)
[place] MAIN [facility] SERVICES [address] [place], [place], [address] Phone: [phone]   Fax:  [phone]  Occupational Therapy Treatment  Patient Details  Name: [name] Long MRN: [id number] Date of Birth: [birth year] No data recorded  Encounter Date: 12/05/2017  OT End of Session - 12/05/17 1221    Visit Number  23    Number of Visits  36    Date for OT Re-Evaluation  01/08/18    OT Start Time  0800    OT Stop Time  0908    OT Time Calculation (min)  68 min    Activity Tolerance  Patient tolerated treatment well;No increased pain    Behavior During Therapy  WFL for tasks assessed/performed       Past Medical History:  Diagnosis Date  . Atrial fibrillation ([place])   . Hypertension     Past Surgical History:  Procedure Laterality Date  . CESAREAN SECTION      There were no vitals filed for this visit.  Subjective Assessment - 12/05/17 0807    Subjective   Pt presents for OT visit 21/36 to address BLE LE. Pt is accompanied by her daughter. Pt reports her weight is down from 218# when we commenced OT for CDT to 196# this morning.  (Pended)     Patient is accompained by:  Family member  (Pended)     Pertinent History  contributing comorbidities include: acute CHF, persistent Afib, HTN, Stage III CKD, obesity, class III  (Pended)     Limitations  difficulty walking, fall risk, chronic, progressive BLE swelling and    pain,  impaired transfers and functional mobility, decreased activity tolerance, impaired balance, limitted standing tolerance- dependent position exacerbates BLE swelling and discomfort, impaired stair climbing (difficulty lifting legs), BLE weakness, difficulty fitting LB clothing and street shoes due to progressive LE swelling, impaired basic ADLs, LB dressing, bathing, skin and nail care, diabetic skin infection)  (Pended)     Pain Onset  In the past 7 days  (Pended)    onset May 2019                  OT  Treatments/Exercises (OP) - 12/05/17 0001      ADLs   ADL Education Given  Yes      Manual Therapy   Manual Therapy  Edema management;Manual Lymphatic Drainage (MLD);Compression Bandaging    Edema Management  skin care w/ castor oil throughout MLD    Manual Lymphatic Drainage (MLD)  MLD in supine to LLE using short neck sequence. deep abdominal pathway and functional inguinal LN. Minimal stroked to medial mid to distal L leg due to soreness and pain with palpation.     Compression Bandaging  LLE knee length compression wrap modified to reduce excessive compression at distal leg to improve comfort and decrease soreness in the area. added 2 layers of  artiflex circumferentially at distal leg and ankle in effort to increase circumference at distal leg to alleviate excess compression at narrowest point of inverted triangle shape. Completed LLE knee length wrap with  1 each shrt stretch wrap , 8 and 10 cm, applied in gradient config. RLE compression wraps as established for thigh high coverage.             OT Education - 12/05/17 1221    Education Details  Continued skilled Pt/caregiver education  And LE ADL training throughout visit for lymphedema self care/ home program, including compression wrapping, compression garment and  device wear/care, lymphatic pumping ther ex, simple self-MLD, and skin care. Discussed progress towards goals.     Person(s) Educated  Patient;Child(ren)    Methods  Explanation;Demonstration    Comprehension  Verbalized understanding;Returned demonstration;Need further instruction          OT Long Term Goals - 12/03/17 0816      OT LONG TERM GOAL #1   Title  Pt modified independent w/ lymphedema precautions/prevention principals and using printed reference to limit LE progression and infection risk.  (Pended)     Baseline  Max A Met 10/25/17  (Pended)     Time  2  (Pended)     Period  Weeks  (Pended)     Status  Achieved  (Pended)       OT LONG TERM GOAL #2    Title  Lymphedema (LE) management/ self-care: Pt able to apply knee length gradient compression wraps with maximum caregiver assistance daily between OT visits using correct gradient techniques within 2 weeks to achieve optimal limb volume reduction during Intensive Phase of CDT and optimal self-management over time.  (Pended)     Baseline  Dependent Goal MET 11/01/17 for full length compression wraps. CG is independent and uses excellent technique.  (Pended)     Time  2  (Pended)     Period  Weeks  (Pended)     Status  Achieved  (Pended)       OT LONG TERM GOAL #3   Title  Lymphedema (LE) management/ self-care:  Pt to achieve at least 10%  limb volume reduction in BLE  during Intensive Phase CDT to limit LE progression, to decrease infection risk and to improve functional mobility and ambulation essential for optimal ADLs performance.  (Pended)     Baseline  dependent  11/01/17 Goal met and exceeded for RLE.  11/27/17 : LLE well reduced below the knee.  (Pended)     Time  12  (Pended)     Period  Weeks  (Pended)     Status  Partially Met  (Pended)       OT LONG TERM GOAL #4   Title  Lymphedema (LE) management/ self-care:  Pt to tolerate daily compression garments and/ or HOS devices in keeping w/ prescribed wear regime within 1 week of issue date to restore normal limb shape to reduce tissue density and protein build up leading to progression.  (Pended)     Baseline  Dependent  (Pended)     Time  12  (Pended)     Period  Weeks  (Pended)     Status  New  (Pended)       OT LONG TERM GOAL #5   Title  Lymphedema management/ Self-care: Pt able to don and doff compression garments and/ or devices with maximum assistance using assistive devices and extra times PRN in order to ensure optimal lymphedema management over time to limit progression risk and infection.  (Pended)     Baseline  Dependent  (Pended)     Time  12  (Pended)     Period  Weeks  (Pended)     Status  New  (Pended)       OT LONG  TERM GOAL #6   Title  Lymphedema (LE) management/ self-care:  During Management Phase CDT Pt to sustain reduced limb volumes achieved during Intensive Phase CDT at follow up visits during Management Phase CDT within 3% using LE self-care home program components as directed ( simple self MLD,  skin care, ther ex and daily/ nightly compression garments/ devices) .  (Pended)     Baseline  dependent  (Pended)     Time  6  (Pended)     Period  Months  (Pended)     Status  New  (Pended)             Plan - 12/05/17 1221    Clinical Impression Statement  Provided MLD and skin are to LLE. Provied BLE compression wraps as established. Pt tolerated treatment without difficulty. Cont as per POC. Pt demonstrates ongoing and steady progress towards goals.    Occupational Profile and client history currently impacting functional performance  lives with son    as is unable to climb stairs at entrance of daughter's home; plans to return to home in [place] in October 2019; unsure about assistance in [place] as all adults children reside here    Occupational performance deficits (Please refer to evaluation for details):  ADL's;IADL's;Leisure;Social Participation    Rehab Potential  Fair    Current Impairments/barriers affecting progress:  Without max caregiver assistance with LE self-care home program between visits prognosis for improvement is poor    OT Frequency  3x / week    OT Duration  12 weeks   and PRN due to severity of BLE   OT Treatment/Interventions  Self-care/ADL training;Therapeutic exercise;Functional Mobility Training;Manual Therapy;Manual lymph drainage;Therapeutic activities;Coping strategies training;DME and/or AE instruction;Compression bandaging;Other (comment);Patient/family education   fit with custom, BLE compression garments and hours of sleep devices   Plan  CDT includes manual lymphatic drainage (MLD), skin care, compression wrapping and garment fitting, therapeutic exercise and  emphasis on Pt and CG self care training    Clinical Decision Making  Multiple treatment options, significant modification of task necessary    Consulted and Agree with Plan of Care  Patient;Family member/caregiver       Patient will benefit from skilled therapeutic intervention in order to improve the following deficits and impairments:  Abnormal gait, Decreased endurance, Decreased skin integrity, Cardiopulmonary status limiting activity, Decreased knowledge of precautions, Impaired perceived functional ability, Decreased activity tolerance, Decreased knowledge of use of DME, Decreased strength, Decreased balance, Decreased mobility, Difficulty walking, Obesity, Decreased range of motion, Increased edema, Pain  Visit Diagnosis: Lymphedema, not elsewhere classified    Problem List Patient Active Problem List   Diagnosis Date Noted  . AKI (acute kidney injury) ([place])   . Acute CHF (congestive heart failure) ([place]) 08/28/2017  . Hypertensive emergency 08/28/2017  . Obesity, Class III, BMI 40-49.9 (morbid obesity) ([place]) 08/28/2017  . Hypokalemia 08/28/2017  . Acute on chronic combined systolic and diastolic CHF (congestive heart failure) ([place])   . Persistent atrial fibrillation   . Essential hypertension   . Stage 3 chronic kidney disease ([place])   . Hypercholesterolemia 12/17/2015  . Routine health maintenance 07/23/2015  . 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

## 2017-12-06 ENCOUNTER — Ambulatory Visit: Payer: Medicare Other | Admitting: Occupational Therapy

## 2017-12-06 DIAGNOSIS — I890 Lymphedema, not elsewhere classified: Secondary | ICD-10-CM | POA: Diagnosis not present

## 2017-12-06 NOTE — Therapy (Signed)
[place] MAIN [facility] SERVICES [address] [place], [place], [address] Phone: [phone]   Fax:  [phone]  Occupational Therapy Treatment  Patient Details  Name: [name] Long MRN: [id number] Date of Birth: [birth year] No data recorded  Encounter Date: 12/06/2017  OT End of Session - 12/06/17 1535    Visit Number  24    Number of Visits  36    Date for OT Re-Evaluation  01/08/18    OT Start Time  0801    OT Stop Time  0908    OT Time Calculation (min)  67 min    Activity Tolerance  Patient tolerated treatment well;No increased pain    Behavior During Therapy  WFL for tasks assessed/performed       Past Medical History:  Diagnosis Date  . Atrial fibrillation ([place])   . Hypertension     Past Surgical History:  Procedure Laterality Date  . CESAREAN SECTION      There were no vitals filed for this visit.  Subjective Assessment - 12/06/17 0808    Subjective   Pt presents for OT visit 24/36 to address BLE LE. Pt is accompanied by her daughter. Pt states, "i'm ok, no pain."    Patient is accompained by:  Family member    Pertinent History  contributing comorbidities include: acute CHF, persistent Afib, HTN, Stage III CKD, obesity, class III    Limitations  difficulty walking, fall risk, chronic, progressive BLE swelling and    pain,  impaired transfers and functional mobility, decreased activity tolerance, impaired balance, limitted standing tolerance- dependent position exacerbates BLE swelling and discomfort, impaired stair climbing (difficulty lifting legs), BLE weakness, difficulty fitting LB clothing and street shoes due to progressive LE swelling, impaired basic ADLs, LB dressing, bathing, skin and nail care, diabetic skin infection)    Pain Onset  In the past 7 days   onset May 2019                  OT Treatments/Exercises (OP) - 12/06/17 0001      ADLs   ADL Education Given  Yes  (Pended)       Manual Therapy    Manual Therapy  Edema management;Manual Lymphatic Drainage (MLD);Compression Bandaging  (Pended)     Edema Management  skin care w/ castor oil throughout MLD  (Pended)     Manual Lymphatic Drainage (MLD)  MLD in supine to LLE using short neck sequence. deep abdominal pathway and functional inguinal LN. Minimal stroked to medial mid to distal L leg due to soreness and pain with palpation.   (Pended)     Compression Bandaging  LLE knee length compression wrap modified to reduce excessive compression at distal leg to improve comfort and decrease soreness in the area. added 2 layers of  artiflex circumferentially at distal leg and ankle in effort to increase circumference at distal leg to alleviate excess compression at narrowest point of inverted triangle shape. Completed LLE knee length wrap with  1 each shrt stretch wrap , 8 and 10 cm, applied in gradient config. RLE compression wraps as established for thigh high coverage.  (Pended)              OT Education - 12/06/17 1533    Education Details  Pt and CG edu re proper skin care regime to limit infection risk. Discussed signs and symptoms of cellulitis and reviewed instructions for seeking medical assistance iof infection is suspected.     Person(s)  Educated  Patient;Child(ren)    Methods  Explanation;Demonstration;Handout    Comprehension  Verbalized understanding;Returned demonstration;Need further instruction          OT Long Term Goals - 12/03/17 0816      OT LONG TERM GOAL #1   Title  Pt modified independent w/ lymphedema precautions/prevention principals and using printed reference to limit LE progression and infection risk.  (Pended)     Baseline  Max A Met 10/25/17  (Pended)     Time  2  (Pended)     Period  Weeks  (Pended)     Status  Achieved  (Pended)       OT LONG TERM GOAL #2   Title  Lymphedema (LE) management/ self-care: Pt able to apply knee length gradient compression wraps with maximum caregiver assistance daily  between OT visits using correct gradient techniques within 2 weeks to achieve optimal limb volume reduction during Intensive Phase of CDT and optimal self-management over time.  (Pended)     Baseline  Dependent Goal MET 11/01/17 for full length compression wraps. CG is independent and uses excellent technique.  (Pended)     Time  2  (Pended)     Period  Weeks  (Pended)     Status  Achieved  (Pended)       OT LONG TERM GOAL #3   Title  Lymphedema (LE) management/ self-care:  Pt to achieve at least 10%  limb volume reduction in BLE  during Intensive Phase CDT to limit LE progression, to decrease infection risk and to improve functional mobility and ambulation essential for optimal ADLs performance.  (Pended)     Baseline  dependent  11/01/17 Goal met and exceeded for RLE.  11/27/17 : LLE well reduced below the knee.  (Pended)     Time  12  (Pended)     Period  Weeks  (Pended)     Status  Partially Met  (Pended)       OT LONG TERM GOAL #4   Title  Lymphedema (LE) management/ self-care:  Pt to tolerate daily compression garments and/ or HOS devices in keeping w/ prescribed wear regime within 1 week of issue date to restore normal limb shape to reduce tissue density and protein build up leading to progression.  (Pended)     Baseline  Dependent  (Pended)     Time  12  (Pended)     Period  Weeks  (Pended)     Status  New  (Pended)       OT LONG TERM GOAL #5   Title  Lymphedema management/ Self-care: Pt able to don and doff compression garments and/ or devices with maximum assistance using assistive devices and extra times PRN in order to ensure optimal lymphedema management over time to limit progression risk and infection.  (Pended)     Baseline  Dependent  (Pended)     Time  12  (Pended)     Period  Weeks  (Pended)     Status  New  (Pended)       OT LONG TERM GOAL #6   Title  Lymphedema (LE) management/ self-care:  During Management Phase CDT Pt to sustain reduced limb volumes achieved during  Intensive Phase CDT at follow up visits during Management Phase CDT within 3% using LE self-care home program components as directed ( simple self MLD, skin care, ther ex and daily/ nightly compression garments/ devices) .  (Pended)     Baseline  dependent  (  Pended)     Time  6  (Pended)     Period  Months  (Pended)     Status  New  (Pended)             Plan - 12/06/17 1536    Clinical Impression Statement  Emphasis of Pt and CG edu today on recommended daily skin care regime to limit infection risk, to [facility] correct skin hydration      and flexibility. Recommended Eucerin lotion or cream as this is low pH  w/ les bacterial growth.  Reviewed simple self-MLD sequences and Pt had opportunity to practice in clinic. She uses correct techniques. Provided LLE MLD and BLE compression wraps as established. Fit custom RLE compression garment as soon as it is fdeliverwed and teach donning and doffing w/ assistive devices.    Occupational Profile and client history currently impacting functional performance  lives with son    as is unable to climb stairs at entrance of daughter's home; plans to return to home in [place] in October 2019; unsure about assistance in [place] as all adults children reside here    Occupational performance deficits (Please refer to evaluation for details):  ADL's;IADL's;Leisure;Social Participation    Rehab Potential  Fair    Current Impairments/barriers affecting progress:  Without max caregiver assistance with LE self-care home program between visits prognosis for improvement is poor    OT Frequency  3x / week    OT Duration  12 weeks   and PRN due to severity of BLE   OT Treatment/Interventions  Self-care/ADL training;Therapeutic exercise;Functional Mobility Training;Manual Therapy;Manual lymph drainage;Therapeutic activities;Coping strategies training;DME and/or AE instruction;Compression bandaging;Other (comment);Patient/family education   fit with custom, BLE  compression garments and hours of sleep devices   Plan  CDT includes manual lymphatic drainage (MLD), skin care, compression wrapping and garment fitting, therapeutic exercise and emphasis on Pt and CG self care training    Clinical Decision Making  Multiple treatment options, significant modification of task necessary    Consulted and Agree with Plan of Care  Patient;Family member/caregiver       Patient will benefit from skilled therapeutic intervention in order to improve the following deficits and impairments:  Abnormal gait, Decreased endurance, Decreased skin integrity, Cardiopulmonary status limiting activity, Decreased knowledge of precautions, Impaired perceived functional ability, Decreased activity tolerance, Decreased knowledge of use of DME, Decreased strength, Decreased balance, Decreased mobility, Difficulty walking, Obesity, Decreased range of motion, Increased edema, Pain  Visit Diagnosis: Lymphedema, not elsewhere classified    Problem List Patient Active Problem List   Diagnosis Date Noted  . AKI (acute kidney injury) ([place])   . Acute CHF (congestive heart failure) ([place]) 08/28/2017  . Hypertensive emergency 08/28/2017  . Obesity, Class III, BMI 40-49.9 (morbid obesity) ([place]) 08/28/2017  . Hypokalemia 08/28/2017  . Acute on chronic combined systolic and diastolic CHF (congestive heart failure) ([place])   . Persistent atrial fibrillation   . Essential hypertension   . Stage 3 chronic kidney disease ([place])   . Hypercholesterolemia 12/17/2015  . Routine health maintenance 07/23/2015  . 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 SERVICES [address] [place], [place], [address] Phone: [phone]   Fax:  [phone]  Name: [name] Long MRN: [id number] Date of Birth: April 08, 1940

## 2017-12-10 ENCOUNTER — Ambulatory Visit: Payer: Medicare Other | Admitting: Occupational Therapy

## 2017-12-12 ENCOUNTER — Ambulatory Visit: Payer: Medicare Other | Admitting: Occupational Therapy

## 2017-12-12 DIAGNOSIS — I890 Lymphedema, not elsewhere classified: Secondary | ICD-10-CM | POA: Diagnosis not present

## 2017-12-12 NOTE — Therapy (Signed)
[place] MAIN [facility] SERVICES [address] [place], [place], [address] Phone: [phone]   Fax:  [phone]  Occupational Therapy Treatment  Patient Details  Name: [name] Long MRN: [id number] Date of Birth: [birth year] No data recorded  Encounter Date: 12/12/2017  OT End of Session - 12/12/17 0918    Visit Number  25    Number of Visits  36    Date for OT Re-Evaluation  01/08/18    OT Start Time  0805    OT Stop Time  0906    OT Time Calculation (min)  61 min    Activity Tolerance  Patient tolerated treatment well;No increased pain    Behavior During Therapy  WFL for tasks assessed/performed       Past Medical History:  Diagnosis Date  . Atrial fibrillation ([place])   . Hypertension     Past Surgical History:  Procedure Laterality Date  . CESAREAN SECTION      There were no vitals filed for this visit.  Subjective Assessment - 12/12/17 0914    Subjective   Pt presents for OT visit 25/36 to address BLE LE. Pt is accompanied by her daughter. Pt had to reschedule last appointment due to facility issue. Pt and daughter report no difficulty managing sefd care during interval.     Patient is accompained by:  Family member    Pertinent History  contributing comorbidities include: acute CHF, persistent Afib, HTN, Stage III CKD, obesity, class III    Limitations  difficulty walking, fall risk, chronic, progressive BLE swelling and    pain,  impaired transfers and functional mobility, decreased activity tolerance, impaired balance, limitted standing tolerance- dependent position exacerbates BLE swelling and discomfort, impaired stair climbing (difficulty lifting legs), BLE weakness, difficulty fitting LB clothing and street shoes due to progressive LE swelling, impaired basic ADLs, LB dressing, bathing, skin and nail care, diabetic skin infection)    Currently in Pain?  Yes    Pain Score  --   not rated   Pain Orientation  Left    Pain  Descriptors / Indicators  Tender    Pain Type  Acute pain    Pain Onset  In the past 7 days   onset May 2019   Pain Frequency  Intermittent                   OT Treatments/Exercises (OP) - 12/12/17 0001      ADLs   ADL Education Given  Yes      Manual Therapy   Manual Therapy  Edema management;Manual Lymphatic Drainage (MLD);Compression Bandaging    Edema Management  skin care to B legs and feet w/ low ph castor oil and Eucerin lotion    Manual Lymphatic Drainage (MLD)  MLD in supine to LLE using short neck sequence. deep abdominal pathway and functional inguinal LN. Minimal stroked to medial mid to distal L leg due to soreness and pain with palpation.     Compression Bandaging  LLE knee length compression wrap modified to reduce excessive compression at distal leg to improve comfort and decrease soreness in the area. added 2 layers of  artiflex circumferentially at distal leg and ankle in effort to increase circumference at distal leg to alleviate excess compression at narrowest point of inverted triangle shape. Completed LLE knee length wrap with  1 each shrt stretch wrap , 8 and 10 cm, applied in gradient config. RLE compression wraps as established for thigh high coverage.  OT Education - 12/12/17 [phone]    Education Details  Continued skilled Pt/caregiver education  And LE ADL training throughout visit for lymphedema self care/ home program, including compression wrapping, compression garment and device wear/care, lymphatic pumping ther ex, simple self-MLD, and skin care. Discussed progress towards goals.     Person(s) Educated  Patient;Child(ren)    Methods  Explanation;Demonstration    Comprehension  Verbalized understanding;Returned demonstration;Need further instruction          OT Long Term Goals - 12/03/17 0816      OT LONG TERM GOAL #1   Title  Pt modified independent w/ lymphedema precautions/prevention principals and using printed reference  to limit LE progression and infection risk.  (Pended)     Baseline  Max A Met 10/25/17  (Pended)     Time  2  (Pended)     Period  Weeks  (Pended)     Status  Achieved  (Pended)       OT LONG TERM GOAL #2   Title  Lymphedema (LE) management/ self-care: Pt able to apply knee length gradient compression wraps with maximum caregiver assistance daily between OT visits using correct gradient techniques within 2 weeks to achieve optimal limb volume reduction during Intensive Phase of CDT and optimal self-management over time.  (Pended)     Baseline  Dependent Goal MET 11/01/17 for full length compression wraps. CG is independent and uses excellent technique.  (Pended)     Time  2  (Pended)     Period  Weeks  (Pended)     Status  Achieved  (Pended)       OT LONG TERM GOAL #3   Title  Lymphedema (LE) management/ self-care:  Pt to achieve at least 10%  limb volume reduction in BLE  during Intensive Phase CDT to limit LE progression, to decrease infection risk and to improve functional mobility and ambulation essential for optimal ADLs performance.  (Pended)     Baseline  dependent  11/01/17 Goal met and exceeded for RLE.  11/27/17 : LLE well reduced below the knee.  (Pended)     Time  12  (Pended)     Period  Weeks  (Pended)     Status  Partially Met  (Pended)       OT LONG TERM GOAL #4   Title  Lymphedema (LE) management/ self-care:  Pt to tolerate daily compression garments and/ or HOS devices in keeping w/ prescribed wear regime within 1 week of issue date to restore normal limb shape to reduce tissue density and protein build up leading to progression.  (Pended)     Baseline  Dependent  (Pended)     Time  12  (Pended)     Period  Weeks  (Pended)     Status  New  (Pended)       OT LONG TERM GOAL #5   Title  Lymphedema management/ Self-care: Pt able to don and doff compression garments and/ or devices with maximum assistance using assistive devices and extra times PRN in order to ensure optimal  lymphedema management over time to limit progression risk and infection.  (Pended)     Baseline  Dependent  (Pended)     Time  12  (Pended)     Period  Weeks  (Pended)     Status  New  (Pended)       OT LONG TERM GOAL #6   Title  Lymphedema (LE) management/ self-care:  During Management Phase  CDT Pt to sustain reduced limb volumes achieved during Intensive Phase CDT at follow up visits during Management Phase CDT within 3% using LE self-care home program components as directed ( simple self MLD, skin care, ther ex and daily/ nightly compression garments/ devices) .  (Pended)     Baseline  dependent  (Pended)     Time  6  (Pended)     Period  Months  (Pended)     Status  New  (Pended)             Plan - 12/12/17 0919    Clinical Impression Statement  Emphasis of Pt and CG edu today on recommended daily skin care regime to limit infection risk, to [facility] correct skin hydration      and flexibility. Recommended Eucerin lotion or cream as this is low pH  w/ les bacterial growth.  Reviewed simple self-MLD sequences and Pt had opportunity to practice in clinic. She uses correct techniques. Provided LLE MLD and BLE compression wraps as established. Fit custom RLE compression garment as soon as it is fdeliverwed and teach donning and doffing w/ assistive devices.  (Pended)     Occupational Profile and client history currently impacting functional performance  lives with son    as is unable to climb stairs at entrance of daughter's home; plans to return to home in [place] in October 2019; unsure about assistance in [place] as all adults children reside here  [employer])     Occupational performance deficits (Please refer to evaluation for details):  ADL's;IADL's;Leisure;Social Participation  (Pended)     Rehab Potential  Fair  (Pended)     Current Impairments/barriers affecting progress:  Without max caregiver assistance with LE self-care home program between visits prognosis for improvement is  poor  (Pended)     OT Frequency  3x / week  (Pended)     OT Duration  12 weeks  (Pended)    and PRN due to severity of BLE   OT Treatment/Interventions  Self-care/ADL training;Therapeutic exercise;Functional Mobility Training;Manual Therapy;Manual lymph drainage;Therapeutic activities;Coping strategies training;DME and/or AE instruction;Compression bandaging;Other (comment);Patient/family education  (Pended)    fit with custom, BLE compression garments and hours of sleep devices   Plan  CDT includes manual lymphatic drainage (MLD), skin care, compression wrapping and garment fitting, therapeutic exercise and emphasis on Pt and CG self care training  (Pended)     Clinical Decision Making  Multiple treatment options, significant modification of task necessary  (Pended)     Consulted and Agree with Plan of Care  Patient;Family member/caregiver  (Pended)        Patient will benefit from skilled therapeutic intervention in order to improve the following deficits and impairments:  (P) Abnormal gait, Decreased endurance, Decreased skin integrity, Cardiopulmonary status limiting activity, Decreased knowledge of precautions, Impaired perceived functional ability, Decreased activity tolerance, Decreased knowledge of use of DME, Decreased strength, Decreased balance, Decreased mobility, Difficulty walking, Obesity, Decreased range of motion, Increased edema, Pain  Visit Diagnosis: Lymphedema, not elsewhere classified    Problem List Patient Active Problem List   Diagnosis Date Noted  . AKI (acute kidney injury) ([place])   . Acute CHF (congestive heart failure) ([place]) 08/28/2017  . Hypertensive emergency 08/28/2017  . Obesity, Class III, BMI 40-49.9 (morbid obesity) ([place]) 08/28/2017  . Hypokalemia 08/28/2017  . Acute on chronic combined systolic and diastolic CHF (congestive heart failure) ([place])   . Persistent atrial fibrillation   . Essential hypertension   . Stage  3 chronic kidney disease ([place])   .  Hypercholesterolemia 12/17/2015  . Routine health maintenance 07/23/2015  . 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

## 2017-12-13 ENCOUNTER — Ambulatory Visit: Payer: Medicare Other | Admitting: Occupational Therapy

## 2017-12-13 DIAGNOSIS — I890 Lymphedema, not elsewhere classified: Secondary | ICD-10-CM

## 2017-12-13 NOTE — Therapy (Signed)
[place] MAIN [facility] SERVICES [address] [place], [place], [address] Phone: [phone]   Fax:  [phone]  Occupational Therapy Treatment  Patient Details  Name: [name] Long MRN: [id number] Date of Birth: 1941/02/21 No data recorded  Encounter Date: 12/13/2017  OT End of Session - 12/13/17 0915    Visit Number  26    Number of Visits  36    Date for OT Re-Evaluation  01/08/18    OT Start Time  0800    OT Stop Time  0903    OT Time Calculation (min)  63 min    Activity Tolerance  Patient tolerated treatment well;No increased pain    Behavior During Therapy  WFL for tasks assessed/performed       Past Medical History:  Diagnosis Date  . Atrial fibrillation ([place])   . Hypertension     Past Surgical History:  Procedure Laterality Date  . CESAREAN SECTION      There were no vitals filed for this visit.  Subjective Assessment - 12/13/17 0912    Subjective   Pt presents for OT visit 25/36 to address BLE LE. Pt is accompanied by her daughter. Pt reports 27 lb. weight loss todate since sommencing OT for CDT on 10/25/17. Pt has no new complaints today. She states she is anxious to receive her compression garments.     Patient is accompained by:  Family member    Pertinent History  contributing comorbidities include: acute CHF, persistent Afib, HTN, Stage III CKD, obesity, class III    Limitations  difficulty walking, fall risk, chronic, progressive BLE swelling and    pain,  impaired transfers and functional mobility, decreased activity tolerance, impaired balance, limitted standing tolerance- dependent position exacerbates BLE swelling and discomfort, impaired stair climbing (difficulty lifting legs), BLE weakness, difficulty fitting LB clothing and street shoes due to progressive LE swelling, impaired basic ADLs, LB dressing, bathing, skin and nail care, diabetic skin infection)    Pain Onset  In the past 7 days   onset May 2019                   OT Treatments/Exercises (OP) - 12/13/17 0001      ADLs   ADL Education Given  Yes      Manual Therapy   Manual Therapy  Edema management;Manual Lymphatic Drainage (MLD);Compression Bandaging    Edema Management  skin care to LLE w/ low ph castor oil and Eucerin lotion. Skin on bottom of R foot is sloughing off.    Manual Lymphatic Drainage (MLD)  MLD in supine to LLE using short neck sequence. deep abdominal pathway and functional inguinal LN. Minimal stroked to medial mid to distal L leg due to soreness and pain with palpation.     Compression Bandaging  LLE knee length compression wrap modified to reduce excessive compression at distal leg to improve comfort and decrease soreness in the area. added 2 layers of  artiflex circumferentially at distal leg and ankle in effort to increase circumference at distal leg to alleviate excess compression at narrowest point of inverted triangle shape. Completed LLE knee length wrap with  1 each shrt stretch wrap , 8 and 10 cm, applied in gradient config. RLE compression wraps as established for thigh high coverage.             OT Education - 12/13/17 0915    Education Details  Continued skilled Pt/caregiver education  And LE ADL training throughout visit  for lymphedema self care/ home program, including compression wrapping, compression garment and device wear/care, lymphatic pumping ther ex, simple self-MLD, and skin care. Discussed progress towards goals.     Person(s) Educated  Patient;Child(ren)    Methods  Explanation;Demonstration    Comprehension  Verbalized understanding;Returned demonstration;Need further instruction          OT Long Term Goals - 12/13/17 0809      OT LONG TERM GOAL #1   Title  Pt modified independent w/ lymphedema precautions/prevention principals and using printed reference to limit LE progression and infection risk.    Baseline  Max A Met 10/25/17    Time  2    Period  Weeks    Status   Achieved      OT LONG TERM GOAL #2   Title  Lymphedema (LE) management/ self-care: Pt able to apply knee length gradient compression wraps with maximum caregiver assistance daily between OT visits using correct gradient techniques within 2 weeks to achieve optimal limb volume reduction during Intensive Phase of CDT and optimal self-management over time.    Baseline  Dependent Goal MET 11/01/17 for full length compression wraps. CG is independent and uses excellent technique.    Time  2    Period  Weeks    Status  Achieved      OT LONG TERM GOAL #3   Title  Lymphedema (LE) management/ self-care:  Pt to achieve at least 10%  limb volume reduction in BLE  during Intensive Phase CDT to limit LE progression, to decrease infection risk and to improve functional mobility and ambulation essential for optimal ADLs performance.    Baseline  dependent  11/01/17 Goal met and exceeded for RLE.  11/27/17 : LLE well reduced below the knee.    Time  12    Period  Weeks    Status  Partially Met      OT LONG TERM GOAL #4   Title  Lymphedema (LE) management/ self-care:  Pt to tolerate daily compression garments and/ or HOS devices in keeping w/ prescribed wear regime within 1 week of issue date to restore normal limb shape to reduce tissue density and protein build up leading to progression.    Baseline  Dependent    Time  12    Period  Weeks    Status  New      OT LONG TERM GOAL #5   Title  Lymphedema management/ Self-care: Pt able to don and doff compression garments and/ or devices with maximum assistance using assistive devices and extra times PRN in order to ensure optimal lymphedema management over time to limit progression risk and infection.    Baseline  Dependent    Time  12    Period  Weeks    Status  New      OT LONG TERM GOAL #6   Title  Lymphedema (LE) management/ self-care:  During Management Phase CDT Pt to sustain reduced limb volumes achieved during Intensive Phase CDT at follow up visits  during Management Phase CDT within 3% using LE self-care home program components as directed ( simple self MLD, skin care, ther ex and daily/ nightly compression garments/ devices) .    Baseline  dependent    Time  6    Period  Months    Status  New            Plan - 12/13/17 0916    Clinical Impression Statement  Reviewed progress towards goals and plan  going forward. Pt and daughter are in agreement w/ plan to decrease OT frequency to 2 x weekly tin an effort to conserve visits since LLE CDT is yet to be completed.. Skin condition continues to improve. Skinon sole of R foot , including a thick medial callous,  continues to is slough off. No signs/ symptoms of open wound or infection observed.  Pt tolerated LLE MLD and skin care, abd BLE compression wrapping as established without difficulty today.  Still awaiting status update from DME vendor on overdue LLE compression stocking.     Occupational Profile and client history currently impacting functional performance  lives with son    as is unable to climb stairs at entrance of daughter's home; plans to return to home in [place] in October 2019; unsure about assistance in [place] as all adults children reside here    Occupational performance deficits (Please refer to evaluation for details):  ADL's;IADL's;Leisure;Social Participation    Rehab Potential  Fair    Current Impairments/barriers affecting progress:  Without max caregiver assistance with LE self-care home program between visits prognosis for improvement is poor    OT Frequency  2x / week    OT Duration  12 weeks   and PRN due to severity of BLE   OT Treatment/Interventions  Self-care/ADL training;Therapeutic exercise;Functional Mobility Training;Manual Therapy;Manual lymph drainage;Therapeutic activities;Coping strategies training;DME and/or AE instruction;Compression bandaging;Other (comment);Patient/family education   fit with custom, BLE compression garments and hours of sleep  devices   Plan  CDT includes manual lymphatic drainage (MLD), skin care, compression wrapping and garment fitting, therapeutic exercise and emphasis on Pt and CG self care training    Clinical Decision Making  Multiple treatment options, significant modification of task necessary    Consulted and Agree with Plan of Care  Patient;Family member/caregiver       Patient will benefit from skilled therapeutic intervention in order to improve the following deficits and impairments:  Abnormal gait, Decreased endurance, Decreased skin integrity, Cardiopulmonary status limiting activity, Decreased knowledge of precautions, Impaired perceived functional ability, Decreased activity tolerance, Decreased knowledge of use of DME, Decreased strength, Decreased balance, Decreased mobility, Difficulty walking, Obesity, Decreased range of motion, Increased edema, Pain  Visit Diagnosis: Lymphedema, not elsewhere classified    Problem List Patient Active Problem List   Diagnosis Date Noted  . AKI (acute kidney injury) ([place])   . Acute CHF (congestive heart failure) ([place]) 08/28/2017  . Hypertensive emergency 08/28/2017  . Obesity, Class III, BMI 40-49.9 (morbid obesity) ([place]) 08/28/2017  . Hypokalemia 08/28/2017  . Acute on chronic combined systolic and diastolic CHF (congestive heart failure) ([place])   . Persistent atrial fibrillation   . Essential hypertension   . Stage 3 chronic kidney disease ([place])   . Hypercholesterolemia 12/17/2015  . Routine health maintenance 07/23/2015  . 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 [place], [place], [address] Phone: [phone]   Fax:  [phone]  Name: [name] Long MRN: [id number] Date of Birth: 07/07/1940

## 2017-12-17 ENCOUNTER — Ambulatory Visit: Payer: Medicare Other | Admitting: Occupational Therapy

## 2017-12-17 DIAGNOSIS — I890 Lymphedema, not elsewhere classified: Secondary | ICD-10-CM

## 2017-12-17 NOTE — Therapy (Signed)
[place] MAIN [place] [facility] SERVICES [address] [place], [place], [address] Phone: [phone]   Fax:  [phone]  Occupational Therapy Treatment  Patient Details  Name: [name] Long MRN: [id number] Date of Birth: 10/21/1940 No data recorded  Encounter Date: 12/17/2017  OT End of Session - 12/17/17 1131    Visit Number  27    Number of Visits  36    Date for OT Re-Evaluation  01/08/18    OT Start Time  0805    OT Stop Time  0905    OT Time Calculation (min)  60 min    Activity Tolerance  Patient tolerated treatment well;No increased pain    Behavior During Therapy  WFL for tasks assessed/performed       Past Medical History:  Diagnosis Date  . Atrial fibrillation ([place])   . Hypertension     Past Surgical History:  Procedure Laterality Date  . CESAREAN SECTION      There were no vitals filed for this visit.  Subjective Assessment - 12/17/17 1124    Subjective   Pt presents for OT visit 27/36 to address BLE LE. Pt is accompanied by her daughter. Pt and daughter report they had a nice weekend without difficulty managing LE at home. Pt reports  she is about to lose a toenail on the L  great toe.     Patient is accompained by:  Family member    Pertinent History  contributing comorbidities include: acute CHF, persistent Afib, HTN, Stage III CKD, obesity, class III    Limitations  difficulty walking, fall risk, chronic, progressive BLE swelling and    pain,  impaired transfers and functional mobility, decreased activity tolerance, impaired balance, limitted standing tolerance- dependent position exacerbates BLE swelling and discomfort, impaired stair climbing (difficulty lifting legs), BLE weakness, difficulty fitting LB clothing and street shoes due to progressive LE swelling, impaired basic ADLs, LB dressing, bathing, skin and nail care, diabetic skin infection)    Currently in Pain?  No/denies    Pain Onset  In the past 7 days   onset May  2019                  OT Treatments/Exercises (OP) - 12/17/17 0001      ADLs   ADL Education Given  Yes      Manual Therapy   Manual Therapy  Edema management;Manual Lymphatic Drainage (MLD);Compression Bandaging    Edema Management  skin care to LLE w/ low ph castor oil and Eucerin lotion. Skin on bottom of R foot is sloughing off.    Manual Lymphatic Drainage (MLD)  MLD in supine to LLE using short neck sequence. deep abdominal pathway and functional inguinal LN. Minimal stroked to medial mid to distal L leg due to soreness and pain with palpation.     Compression Bandaging  LLE knee length compression wrap modified to reduce excessive compression at distal leg to improve comfort and decrease soreness in the area. added 2 layers of  artiflex circumferentially at distal leg and ankle in effort to increase circumference at distal leg to alleviate excess compression at narrowest point of inverted triangle shape. Completed LLE knee length wrap with  1 each shrt stretch wrap , 8 and 10 cm, applied in gradient config. RLE compression wraps as established for thigh high coverage.             OT Education - 12/17/17 1126    Education Details  Continued skilled  Pt/caregiver education  And LE ADL training throughout visit for lymphedema self care/ home program, including compression wrapping, compression garment and device wear/care, lymphatic pumping ther ex, simple self-MLD, and skin care. Discussed progress towards goals.     Person(s) Educated  Patient;Child(ren)    Methods  Explanation;Demonstration    Comprehension  Verbalized understanding;Returned demonstration;Need further instruction          OT Long Term Goals - 12/13/17 0809      OT LONG TERM GOAL #1   Title  Pt modified independent w/ lymphedema precautions/prevention principals and using printed reference to limit LE progression and infection risk.    Baseline  Max A Met 10/25/17    Time  2    Period  Weeks     Status  Achieved      OT LONG TERM GOAL #2   Title  Lymphedema (LE) management/ self-care: Pt able to apply knee length gradient compression wraps with maximum caregiver assistance daily between OT visits using correct gradient techniques within 2 weeks to achieve optimal limb volume reduction during Intensive Phase of CDT and optimal self-management over time.    Baseline  Dependent Goal MET 11/01/17 for full length compression wraps. CG is independent and uses excellent technique.    Time  2    Period  Weeks    Status  Achieved      OT LONG TERM GOAL #3   Title  Lymphedema (LE) management/ self-care:  Pt to achieve at least 10%  limb volume reduction in BLE  during Intensive Phase CDT to limit LE progression, to decrease infection risk and to improve functional mobility and ambulation essential for optimal ADLs performance.    Baseline  dependent  11/01/17 Goal met and exceeded for RLE.  11/27/17 : LLE well reduced below the knee.    Time  12    Period  Weeks    Status  Partially Met      OT LONG TERM GOAL #4   Title  Lymphedema (LE) management/ self-care:  Pt to tolerate daily compression garments and/ or HOS devices in keeping w/ prescribed wear regime within 1 week of issue date to restore normal limb shape to reduce tissue density and protein build up leading to progression.    Baseline  Dependent    Time  12    Period  Weeks    Status  New      OT LONG TERM GOAL #5   Title  Lymphedema management/ Self-care: Pt able to don and doff compression garments and/ or devices with maximum assistance using assistive devices and extra times PRN in order to ensure optimal lymphedema management over time to limit progression risk and infection.    Baseline  Dependent    Time  12    Period  Weeks    Status  New      OT LONG TERM GOAL #6   Title  Lymphedema (LE) management/ self-care:  During Management Phase CDT Pt to sustain reduced limb volumes achieved during Intensive Phase CDT at follow  up visits during Management Phase CDT within 3% using LE self-care home program components as directed ( simple self MLD, skin care, ther ex and daily/ nightly compression garments/ devices) .    Baseline  dependent    Time  6    Period  Months    Status  New            Plan - 12/17/17 1126    Clinical  Impression Statement  Lobule at medial L thigh noticably more swollen and dense today compared with volume and density at last visit. No other vulume increases noted bilaterally. L great toe nail in detatched except to meial edge at cuticle. Applied bandaid during MLD. Pt had no difficulty tolerating MLD, SKIN care and compression wrapping today. Garment order is to be expedited by DME vendor since order to manufacturer was somehow lost in translation, delaying fitting. Pt and CG in agreement w/ plan to decrease OT frequency to 2 x weekly to conserve visits.    Occupational Profile and client history currently impacting functional performance  lives with son    as is unable to climb stairs at entrance of daughter's home; plans to return to home in [place] in October 2019; unsure about assistance in [place] as all adults children reside here    Occupational performance deficits (Please refer to evaluation for details):  ADL's;IADL's;Leisure;Social Participation    Rehab Potential  Fair    Current Impairments/barriers affecting progress:  Without max caregiver assistance with LE self-care home program between visits prognosis for improvement is poor    OT Frequency  2x / week    OT Duration  12 weeks   and PRN due to severity of BLE   OT Treatment/Interventions  Self-care/ADL training;Therapeutic exercise;Functional Mobility Training;Manual Therapy;Manual lymph drainage;Therapeutic activities;Coping strategies training;DME and/or AE instruction;Compression bandaging;Other (comment);Patient/family education   fit with custom, BLE compression garments and hours of sleep devices   Plan  CDT  includes manual lymphatic drainage (MLD), skin care, compression wrapping and garment fitting, therapeutic exercise and emphasis on Pt and CG self care training    Clinical Decision Making  Multiple treatment options, significant modification of task necessary    Consulted and Agree with Plan of Care  Patient;Family member/caregiver       Patient will benefit from skilled therapeutic intervention in order to improve the following deficits and impairments:  Abnormal gait, Decreased endurance, Decreased skin integrity, Cardiopulmonary status limiting activity, Decreased knowledge of precautions, Impaired perceived functional ability, Decreased activity tolerance, Decreased knowledge of use of DME, Decreased strength, Decreased balance, Decreased mobility, Difficulty walking, Obesity, Decreased range of motion, Increased edema, Pain  Visit Diagnosis: Lymphedema, not elsewhere classified    Problem List Patient Active Problem List   Diagnosis Date Noted  . AKI (acute kidney injury) ([place])   . Acute CHF (congestive heart failure) ([place]) 08/28/2017  . Hypertensive emergency 08/28/2017  . Obesity, Class III, BMI 40-49.9 (morbid obesity) ([place]) 08/28/2017  . Hypokalemia 08/28/2017  . Acute on chronic combined systolic and diastolic CHF (congestive heart failure) ([place])   . Persistent atrial fibrillation   . Essential hypertension   . Stage 3 chronic kidney disease ([place])   . Hypercholesterolemia 12/17/2015  . Routine health maintenance 07/23/2015  . 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

## 2017-12-19 ENCOUNTER — Ambulatory Visit: Payer: Medicare Other | Admitting: Occupational Therapy

## 2017-12-20 ENCOUNTER — Ambulatory Visit: Payer: Medicare Other | Admitting: Occupational Therapy

## 2017-12-20 DIAGNOSIS — I890 Lymphedema, not elsewhere classified: Secondary | ICD-10-CM

## 2017-12-20 NOTE — Therapy (Signed)
[place] MAIN [facility] SERVICES [address] [place], [place], [address] Phone: [phone]   Fax:  [phone]  Occupational Therapy Treatment  Patient Details  Name: [name] Long MRN: [id number] Date of Birth: [birth year] No data recorded  Encounter Date: 12/20/2017  OT End of Session - 12/20/17 0805    Visit Number  29    Number of Visits  36    Date for OT Re-Evaluation  01/08/18    OT Start Time  0800    OT Stop Time  0900    OT Time Calculation (min)  60 min    Activity Tolerance  Patient tolerated treatment well;No increased pain    Behavior During Therapy  WFL for tasks assessed/performed       Past Medical History:  Diagnosis Date  . Atrial fibrillation ([place])   . Hypertension     Past Surgical History:  Procedure Laterality Date  . CESAREAN SECTION      There were no vitals filed for this visit.  Subjective Assessment - 12/20/17 0925    Subjective   Pt presents for OT visit 29/36 to address BLE LE. Pt is accompanied by her daughter. Team discussed concern for conserving visits and all agree with plan to reduce frequency to 1 x weekly. Custom compression garmenrt has not yet arrived. [name] will call DME vendor today to request tracking.Pt is hanging in there patiently, but  she is also anxious to receive LLE garment.     Patient is accompained by:  Family member    Pertinent History  contributing comorbidities include: acute CHF, persistent Afib, HTN, Stage III CKD, obesity, class III    Limitations  difficulty walking, fall risk, chronic, progressive BLE swelling and    pain,  impaired transfers and functional mobility, decreased activity tolerance, impaired balance, limitted standing tolerance- dependent position exacerbates BLE swelling and discomfort, impaired stair climbing (difficulty lifting legs), BLE weakness, difficulty fitting LB clothing and street shoes due to progressive LE swelling, impaired basic ADLs, LB dressing,  bathing, skin and nail care, diabetic skin infection)    Currently in Pain?  No/denies    Pain Onset  In the past 7 days   onset May 2019                  OT Treatments/Exercises (OP) - 12/20/17 0001      ADLs   ADL Education Given  Yes      Manual Therapy   Manual Therapy  Edema management;Manual Lymphatic Drainage (MLD);Compression Bandaging    Edema Management  skin care to LLE w/ low ph castor oil and Eucerin lotion. Skin on bottom of R foot is sloughing off.    Manual Lymphatic Drainage (MLD)  MLD in supine to LLE using short neck sequence. deep abdominal pathway and functional inguinal LN. Minimal stroked to medial mid to distal L leg due to soreness and pain with palpation.     Compression Bandaging  LLE knee length compression wrap modified to reduce excessive compression at distal leg to improve comfort and decrease soreness in the area. added 2 layers of  artiflex circumferentially at distal leg and ankle in effort to increase circumference at distal leg to alleviate excess compression at narrowest point of inverted triangle shape. Completed LLE knee length wrap with  1 each shrt stretch wrap , 8 and 10 cm, applied in gradient config. RLE compression wraps as established for thigh high coverage.  OT Education - 12/20/17 0929    Education Details  Pt and CG edu for donning custom thigh length compression garment using assistive tech.    Person(s) Educated  Patient;Child(ren)    Methods  Explanation;Demonstration    Comprehension  Verbalized understanding;Returned demonstration;Need further instruction          OT Long Term Goals - 12/13/17 0809      OT LONG TERM GOAL #1   Title  Pt modified independent w/ lymphedema precautions/prevention principals and using printed reference to limit LE progression and infection risk.    Baseline  Max A Met 10/25/17    Time  2    Period  Weeks    Status  Achieved      OT LONG TERM GOAL #2   Title   Lymphedema (LE) management/ self-care: Pt able to apply knee length gradient compression wraps with maximum caregiver assistance daily between OT visits using correct gradient techniques within 2 weeks to achieve optimal limb volume reduction during Intensive Phase of CDT and optimal self-management over time.    Baseline  Dependent Goal MET 11/01/17 for full length compression wraps. CG is independent and uses excellent technique.    Time  2    Period  Weeks    Status  Achieved      OT LONG TERM GOAL #3   Title  Lymphedema (LE) management/ self-care:  Pt to achieve at least 10%  limb volume reduction in BLE  during Intensive Phase CDT to limit LE progression, to decrease infection risk and to improve functional mobility and ambulation essential for optimal ADLs performance.    Baseline  dependent  11/01/17 Goal met and exceeded for RLE.  11/27/17 : LLE well reduced below the knee.    Time  12    Period  Weeks    Status  Partially Met      OT LONG TERM GOAL #4   Title  Lymphedema (LE) management/ self-care:  Pt to tolerate daily compression garments and/ or HOS devices in keeping w/ prescribed wear regime within 1 week of issue date to restore normal limb shape to reduce tissue density and protein build up leading to progression.    Baseline  Dependent    Time  12    Period  Weeks    Status  New      OT LONG TERM GOAL #5   Title  Lymphedema management/ Self-care: Pt able to don and doff compression garments and/ or devices with maximum assistance using assistive devices and extra times PRN in order to ensure optimal lymphedema management over time to limit progression risk and infection.    Baseline  Dependent    Time  12    Period  Weeks    Status  New      OT LONG TERM GOAL #6   Title  Lymphedema (LE) management/ self-care:  During Management Phase CDT Pt to sustain reduced limb volumes achieved during Intensive Phase CDT at follow up visits during Management Phase CDT within 3% using LE  self-care home program components as directed ( simple self MLD, skin care, ther ex and daily/ nightly compression garments/ devices) .    Baseline  dependent    Time  6    Period  Months    Status  New            Plan - 12/20/17 0930    Clinical Impression Statement  Pt tolerated MLD, skin care ,  compression therapy  and edu for donning custom garment  without difficulty. Pt agrees with plan to wrap one leg at a time on alternating nights if custom garment has to be remade as a knee high.  Decrease OT frequency to 1 x weekly to conserve visits.     Occupational Profile and client history currently impacting functional performance  lives with son    as is unable to climb stairs at entrance of daughter's home; plans to return to home in [place] in October 2019; unsure about assistance in [place] as all adults children reside here    Occupational performance deficits (Please refer to evaluation for details):  ADL's;IADL's;Leisure;Social Participation    Rehab Potential  Fair    Current Impairments/barriers affecting progress:  Without max caregiver assistance with LE self-care home program between visits prognosis for improvement is poor    OT Frequency  2x / week    OT Duration  12 weeks   and PRN due to severity of BLE   OT Treatment/Interventions  Self-care/ADL training;Therapeutic exercise;Functional Mobility Training;Manual Therapy;Manual lymph drainage;Therapeutic activities;Coping strategies training;DME and/or AE instruction;Compression bandaging;Other (comment);Patient/family education   fit with custom, BLE compression garments and hours of sleep devices   Plan  CDT includes manual lymphatic drainage (MLD), skin care, compression wrapping and garment fitting, therapeutic exercise and emphasis on Pt and CG self care training    Clinical Decision Making  Multiple treatment options, significant modification of task necessary    Consulted and Agree with Plan of Care  Patient;Family  member/caregiver       Patient will benefit from skilled therapeutic intervention in order to improve the following deficits and impairments:  Abnormal gait, Decreased endurance, Decreased skin integrity, Cardiopulmonary status limiting activity, Decreased knowledge of precautions, Impaired perceived functional ability, Decreased activity tolerance, Decreased knowledge of use of DME, Decreased strength, Decreased balance, Decreased mobility, Difficulty walking, Obesity, Decreased range of motion, Increased edema, Pain  Visit Diagnosis: Lymphedema, not elsewhere classified    Problem List Patient Active Problem List   Diagnosis Date Noted  . AKI (acute kidney injury) ([place])   . Acute CHF (congestive heart failure) ([place]) 08/28/2017  . Hypertensive emergency 08/28/2017  . Obesity, Class III, BMI 40-49.9 (morbid obesity) ([place]) 08/28/2017  . Hypokalemia 08/28/2017  . Acute on chronic combined systolic and diastolic CHF (congestive heart failure) ([place])   . Persistent atrial fibrillation   . Essential hypertension   . Stage 3 chronic kidney disease ([place])   . Hypercholesterolemia 12/17/2015  . Routine health maintenance 07/23/2015  . 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 [place], [place], [address] Phone: [phone]   Fax:  [phone]  Name: [name] Long MRN: [id number] Date of Birth: 12-03-1940

## 2017-12-25 ENCOUNTER — Ambulatory Visit: Payer: Medicare Other | Admitting: Occupational Therapy

## 2017-12-25 DIAGNOSIS — I890 Lymphedema, not elsewhere classified: Secondary | ICD-10-CM

## 2017-12-25 NOTE — Therapy (Signed)
[place] MAIN [facility] SERVICES [address] [place], [place], [address] Phone: [phone]   Fax:  [phone]  Occupational Therapy Treatment  Patient Details  Name: [name] Long MRN: [id number] Date of Birth: [birth year] No data recorded  Encounter Date: 12/25/2017  OT End of Session - 12/25/17 0924    Visit Number  30    Number of Visits  36    Date for OT Re-Evaluation  01/08/18    OT Start Time  0805    OT Stop Time  0907    OT Time Calculation (min)  62 min    Activity Tolerance  Patient tolerated treatment well;No increased pain    Behavior During Therapy  WFL for tasks assessed/performed       Past Medical History:  Diagnosis Date  . Atrial fibrillation ([place])   . Hypertension     Past Surgical History:  Procedure Laterality Date  . CESAREAN SECTION      There were no vitals filed for this visit.  Subjective Assessment - 12/25/17 0925    Subjective   Pt presents for OT visit 30/36 to address BLE LE. Pt is accompanied by her daughter. Pt brings RLE custom compression thigh-length garment to session for fitting and initial  assessment.    Patient is accompained by:  Family member    Pertinent History  contributing comorbidities include: acute CHF, persistent Afib, HTN, Stage III CKD, obesity, class III    Limitations  difficulty walking, fall risk, chronic, progressive BLE swelling and    pain,  impaired transfers and functional mobility, decreased activity tolerance, impaired balance, limitted standing tolerance- dependent position exacerbates BLE swelling and discomfort, impaired stair climbing (difficulty lifting legs), BLE weakness, difficulty fitting LB clothing and street shoes due to progressive LE swelling, impaired basic ADLs, LB dressing, bathing, skin and nail care, diabetic skin infection)    Currently in Pain?  No/denies    Pain Onset  In the past 7 days   onset May 2019                  OT  Treatments/Exercises (OP) - 12/25/17 0001      ADLs   ADL Education Given  Yes      Manual Therapy   Manual Therapy  Edema management;Manual Lymphatic Drainage (MLD);Compression Bandaging    Edema Management  skin care to LLE w/ low ph castor oil and Eucerin lotion during MLD    Manual Lymphatic Drainage (MLD)  MLD in supine to LLE using short neck sequence. deep abdominal pathway and functional inguinal LN. Minimal stroked to medial mid to distal L leg due to soreness and pain with palpation.     Compression Bandaging  LLE thigh  length compression wrap as established. Costom thigh length compression garment to RLE             OT Education - 12/25/17 0929    Education Details  Pt and CG edu for donning custom thigh length compression garment using friction gloves and [employer]vek footies    Person(s) Educated  Patient;Child(ren)    Methods  Explanation;Demonstration    Comprehension  Verbalized understanding;Returned demonstration;Need further instruction          OT Long Term Goals - 12/13/17 0809      OT LONG TERM GOAL #1   Title  Pt modified independent w/ lymphedema precautions/prevention principals and using printed reference to limit LE progression and infection risk.    Baseline  Max  A Met 10/25/17    Time  2    Period  Weeks    Status  Achieved      OT LONG TERM GOAL #2   Title  Lymphedema (LE) management/ self-care: Pt able to apply knee length gradient compression wraps with maximum caregiver assistance daily between OT visits using correct gradient techniques within 2 weeks to achieve optimal limb volume reduction during Intensive Phase of CDT and optimal self-management over time.    Baseline  Dependent Goal MET 11/01/17 for full length compression wraps. CG is independent and uses excellent technique.    Time  2    Period  Weeks    Status  Achieved      OT LONG TERM GOAL #3   Title  Lymphedema (LE) management/ self-care:  Pt to achieve at least 10%  limb volume  reduction in BLE  during Intensive Phase CDT to limit LE progression, to decrease infection risk and to improve functional mobility and ambulation essential for optimal ADLs performance.    Baseline  dependent  11/01/17 Goal met and exceeded for RLE.  11/27/17 : LLE well reduced below the knee.    Time  12    Period  Weeks    Status  Partially Met      OT LONG TERM GOAL #4   Title  Lymphedema (LE) management/ self-care:  Pt to tolerate daily compression garments and/ or HOS devices in keeping w/ prescribed wear regime within 1 week of issue date to restore normal limb shape to reduce tissue density and protein build up leading to progression.    Baseline  Dependent    Time  12    Period  Weeks    Status  New      OT LONG TERM GOAL #5   Title  Lymphedema management/ Self-care: Pt able to don and doff compression garments and/ or devices with maximum assistance using assistive devices and extra times PRN in order to ensure optimal lymphedema management over time to limit progression risk and infection.    Baseline  Dependent    Time  12    Period  Weeks    Status  New      OT LONG TERM GOAL #6   Title  Lymphedema (LE) management/ self-care:  During Management Phase CDT Pt to sustain reduced limb volumes achieved during Intensive Phase CDT at follow up visits during Management Phase CDT within 3% using LE self-care home program components as directed ( simple self MLD, skin care, ther ex and daily/ nightly compression garments/ devices) .    Baseline  dependent    Time  6    Period  Months    Status  New            Plan - 12/25/17 0930    Clinical Impression Statement  Majority of session devoted to Pt and family edu for donning and doffing, wear and care of RLE custom, thigh-length compression stocking. By end of session Pt was able to don garment with moderate assistance and using friction gloves.  By initial assessment RLE custom garment  may be a little too lose at all landmarks.  Alternatively, if garment were much tighter it may roll down due to very soft, redundant tissue from knee to groin. Pt will wear garment daily during visit interval and report any difficulties with fit and function at next visit. Provided MLD and ompression therapy to LLY for remainder of session. Cont as per POC.  Occupational Profile and client history currently impacting functional performance  lives with son    as is unable to climb stairs at entrance of daughter's home; plans to return to home in [place] in October 2019; unsure about assistance in [place] as all adults children reside here    Occupational performance deficits (Please refer to evaluation for details):  ADL's;IADL's;Leisure;Social Participation    Rehab Potential  Fair    Current Impairments/barriers affecting progress:  Without max caregiver assistance with LE self-care home program between visits prognosis for improvement is poor    OT Frequency  2x / week    OT Duration  12 weeks   and PRN due to severity of BLE   OT Treatment/Interventions  Self-care/ADL training;Therapeutic exercise;Functional Mobility Training;Manual Therapy;Manual lymph drainage;Therapeutic activities;Coping strategies training;DME and/or AE instruction;Compression bandaging;Other (comment);Patient/family education   fit with custom, BLE compression garments and hours of sleep devices   Plan  CDT includes manual lymphatic drainage (MLD), skin care, compression wrapping and garment fitting, therapeutic exercise and emphasis on Pt and CG self care training    Clinical Decision Making  Multiple treatment options, significant modification of task necessary    Consulted and Agree with Plan of Care  Patient;Family member/caregiver       Patient will benefit from skilled therapeutic intervention in order to improve the following deficits and impairments:  Abnormal gait, Decreased endurance, Decreased skin integrity, Cardiopulmonary status limiting activity,  Decreased knowledge of precautions, Impaired perceived functional ability, Decreased activity tolerance, Decreased knowledge of use of DME, Decreased strength, Decreased balance, Decreased mobility, Difficulty walking, Obesity, Decreased range of motion, Increased edema, Pain  Visit Diagnosis: Lymphedema, not elsewhere classified    Problem List Patient Active Problem List   Diagnosis Date Noted  . AKI (acute kidney injury) ([place])   . Acute CHF (congestive heart failure) ([place]) 08/28/2017  . Hypertensive emergency 08/28/2017  . Obesity, Class III, BMI 40-49.9 (morbid obesity) ([place]) 08/28/2017  . Hypokalemia 08/28/2017  . Acute on chronic combined systolic and diastolic CHF (congestive heart failure) ([place])   . Persistent atrial fibrillation   . Essential hypertension   . Stage 3 chronic kidney disease ([place])   . Hypercholesterolemia 12/17/2015  . Routine health maintenance 07/23/2015  . 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

## 2017-12-27 ENCOUNTER — Ambulatory Visit: Payer: Medicare Other | Admitting: Occupational Therapy

## 2017-12-27 DIAGNOSIS — I890 Lymphedema, not elsewhere classified: Secondary | ICD-10-CM | POA: Diagnosis not present

## 2017-12-27 NOTE — Therapy (Signed)
[place] MAIN [facility] SERVICES [address] [place], [place], [address] Phone: [phone]   Fax:  [phone]  Occupational Therapy Treatment  Patient Details  Name: [name] Long MRN: [id number] Date of Birth: [birth year] No data recorded  Encounter Date: 12/27/2017  OT End of Session - 12/27/17 1628    Visit Number  31    Number of Visits  36    Date for OT Re-Evaluation  01/08/18    OT Start Time  0800    OT Stop Time  0900    OT Time Calculation (min)  60 min    Activity Tolerance  Patient tolerated treatment well;No increased pain    Behavior During Therapy  WFL for tasks assessed/performed       Past Medical History:  Diagnosis Date  . Atrial fibrillation ([place])   . Hypertension     Past Surgical History:  Procedure Laterality Date  . CESAREAN SECTION      There were no vitals filed for this visit.  Subjective Assessment - 12/27/17 1623    Subjective   Pt presents for OT visit 31/36 to address BLE LE. Pt is accompanied by her daughter. Pt is wearing custom RLE, thigh high compression stocking to clinic. Pt reports that garment will not stay up at top edge.    Patient is accompained by:  Family member    Pertinent History  contributing comorbidities include: acute CHF, persistent Afib, HTN, Stage III CKD, obesity, class III    Limitations  difficulty walking, fall risk, chronic, progressive BLE swelling and    pain,  impaired transfers and functional mobility, decreased activity tolerance, impaired balance, limitted standing tolerance- dependent position exacerbates BLE swelling and discomfort, impaired stair climbing (difficulty lifting legs), BLE weakness, difficulty fitting LB clothing and street shoes due to progressive LE swelling, impaired basic ADLs, LB dressing, bathing, skin and nail care, diabetic skin infection)    Currently in Pain?  No/denies    Pain Onset  In the past 7 days   onset May 2019                   OT Treatments/Exercises (OP) - 12/27/17 0001      ADLs   ADL Education Given  Yes      Manual Therapy   Manual Therapy  Edema management;Manual Lymphatic Drainage (MLD);Compression Bandaging    Manual therapy comments  Repeated RLE anatomical measurements for custom compression knee highs to replace thigh length garments thatfall down from top edge.    Edema Management  skin care to LLE w/ low ph castor oil and Eucerin lotion during MLD    Manual Lymphatic Drainage (MLD)  MLD in supine to LLE using short neck sequence. deep abdominal pathway and functional inguinal LN. Minimal stroked to medial mid to distal L leg due to soreness and pain with palpation.     Compression Bandaging  LLE thigh  length compression wrap as established. Costom thigh length compression garment to RLE w/ garment adhesive on a trial basis.              OT Education - 12/27/17 1624    Education Details  Continued skilled Pt/caregiver education  And LE ADL training throughout visit for lymphedema self care/ home program, including compression wrapping, compression garment and device wear/care, lymphatic pumping ther ex, simple self-MLD, and skin care. Discussed progress towards goals.     Person(s) Educated  Patient;Child(ren)    Methods  Explanation;Demonstration  Comprehension  Verbalized understanding;Returned demonstration;Need further instruction          OT Long Term Goals - 12/13/17 0809      OT LONG TERM GOAL #1   Title  Pt modified independent w/ lymphedema precautions/prevention principals and using printed reference to limit LE progression and infection risk.    Baseline  Max A Met 10/25/17    Time  2    Period  Weeks    Status  Achieved      OT LONG TERM GOAL #2   Title  Lymphedema (LE) management/ self-care: Pt able to apply knee length gradient compression wraps with maximum caregiver assistance daily between OT visits using correct gradient techniques within  2 weeks to achieve optimal limb volume reduction during Intensive Phase of CDT and optimal self-management over time.    Baseline  Dependent Goal MET 11/01/17 for full length compression wraps. CG is independent and uses excellent technique.    Time  2    Period  Weeks    Status  Achieved      OT LONG TERM GOAL #3   Title  Lymphedema (LE) management/ self-care:  Pt to achieve at least 10%  limb volume reduction in BLE  during Intensive Phase CDT to limit LE progression, to decrease infection risk and to improve functional mobility and ambulation essential for optimal ADLs performance.    Baseline  dependent  11/01/17 Goal met and exceeded for RLE.  11/27/17 : LLE well reduced below the knee.    Time  12    Period  Weeks    Status  Partially Met      OT LONG TERM GOAL #4   Title  Lymphedema (LE) management/ self-care:  Pt to tolerate daily compression garments and/ or HOS devices in keeping w/ prescribed wear regime within 1 week of issue date to restore normal limb shape to reduce tissue density and protein build up leading to progression.    Baseline  Dependent    Time  12    Period  Weeks    Status  New      OT LONG TERM GOAL #5   Title  Lymphedema management/ Self-care: Pt able to don and doff compression garments and/ or devices with maximum assistance using assistive devices and extra times PRN in order to ensure optimal lymphedema management over time to limit progression risk and infection.    Baseline  Dependent    Time  12    Period  Weeks    Status  New      OT LONG TERM GOAL #6   Title  Lymphedema (LE) management/ self-care:  During Management Phase CDT Pt to sustain reduced limb volumes achieved during Intensive Phase CDT at follow up visits during Management Phase CDT within 3% using LE self-care home program components as directed ( simple self MLD, skin care, ther ex and daily/ nightly compression garments/ devices) .    Baseline  dependent    Time  6    Period  Months     Status  New            Plan - 12/27/17 1628    Clinical Impression Statement  Remeasures RLE for knee length compression garment to replace thigh length garment that will not stay in place at top edge. Pt borrowed garment adhesis to  use on trial basis over the weekend, but team isn't optimistic that this will secure top edge of garment. Provided MLD and LLE  thigh length compression wrap. Sent new measurements to vendor  after visit via fax. Cont1 x weekly until LLE garment fitting is complete.    Occupational Profile and client history currently impacting functional performance  lives with son    as is unable to climb stairs at entrance of daughter's home; plans to return to home in [place] in October 2019; unsure about assistance in [place] as all adults children reside here    Occupational performance deficits (Please refer to evaluation for details):  ADL's;IADL's;Leisure;Social Participation    Rehab Potential  Fair    Current Impairments/barriers affecting progress:  Without max caregiver assistance with LE self-care home program between visits prognosis for improvement is poor    OT Frequency  2x / week    OT Duration  12 weeks   and PRN due to severity of BLE   OT Treatment/Interventions  Self-care/ADL training;Therapeutic exercise;Functional Mobility Training;Manual Therapy;Manual lymph drainage;Therapeutic activities;Coping strategies training;DME and/or AE instruction;Compression bandaging;Other (comment);Patient/family education   fit with custom, BLE compression garments and hours of sleep devices   Plan  CDT includes manual lymphatic drainage (MLD), skin care, compression wrapping and garment fitting, therapeutic exercise and emphasis on Pt and CG self care training    Clinical Decision Making  Multiple treatment options, significant modification of task necessary    Consulted and Agree with Plan of Care  Patient;Family member/caregiver       Patient will benefit from  skilled therapeutic intervention in order to improve the following deficits and impairments:  Abnormal gait, Decreased endurance, Decreased skin integrity, Cardiopulmonary status limiting activity, Decreased knowledge of precautions, Impaired perceived functional ability, Decreased activity tolerance, Decreased knowledge of use of DME, Decreased strength, Decreased balance, Decreased mobility, Difficulty walking, Obesity, Decreased range of motion, Increased edema, Pain  Visit Diagnosis: Lymphedema, not elsewhere classified    Problem List Patient Active Problem List   Diagnosis Date Noted  . AKI (acute kidney injury) ([place])   . Acute CHF (congestive heart failure) ([place]) 08/28/2017  . Hypertensive emergency 08/28/2017  . Obesity, Class III, BMI 40-49.9 (morbid obesity) ([place]) 08/28/2017  . Hypokalemia 08/28/2017  . Acute on chronic combined systolic and diastolic CHF (congestive heart failure) ([place])   . Persistent atrial fibrillation   . Essential hypertension   . Stage 3 chronic kidney disease ([place])   . Hypercholesterolemia 12/17/2015  . Routine health maintenance 07/23/2015  . 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

## 2018-01-03 ENCOUNTER — Ambulatory Visit: Payer: Medicare Other | Admitting: Occupational Therapy

## 2018-01-03 DIAGNOSIS — I890 Lymphedema, not elsewhere classified: Secondary | ICD-10-CM

## 2018-01-03 NOTE — Therapy (Signed)
[place] MAIN [facility] SERVICES [address] [place], [place], [address] Phone: [phone]   Fax:  [phone]  Occupational Therapy Treatment  Patient Details  Name: [name] Long MRN: [id number] Date of Birth: 1940-04-03 No data recorded  Encounter Date: 01/03/2018  OT End of Session - 01/03/18 0804    Visit Number  32    Number of Visits  36    Date for OT Re-Evaluation  01/08/18    OT Start Time  0800    OT Stop Time  0900    OT Time Calculation (min)  60 min    Activity Tolerance  Patient tolerated treatment well;No increased pain    Behavior During Therapy  WFL for tasks assessed/performed       Past Medical History:  Diagnosis Date  . Atrial fibrillation ([place])   . Hypertension     Past Surgical History:  Procedure Laterality Date  . CESAREAN SECTION      There were no vitals filed for this visit.  Subjective Assessment - 01/03/18 0910    Subjective   Pt presents for OT visit 32/36 to address BLE LE. Pt is accompanied by her daughter. Pt was last seen 1 week ago. Pt reports she and [name] have been doing all home program componenets daily. Pt reports RLE thigh length stocking will not stay in place at top band.    Patient is accompained by:  Family member    Pertinent History  contributing comorbidities include: acute CHF, persistent Afib, HTN, Stage III CKD, obesity, class III    Limitations  difficulty walking, fall risk, chronic, progressive BLE swelling and    pain,  impaired transfers and functional mobility, decreased activity tolerance, impaired balance, limitted standing tolerance- dependent position exacerbates BLE swelling and discomfort, impaired stair climbing (difficulty lifting legs), BLE weakness, difficulty fitting LB clothing and street shoes due to progressive LE swelling, impaired basic ADLs, LB dressing, bathing, skin and nail care, diabetic skin infection)    Currently in Pain?  No/denies    Pain Onset  In the  past 7 days   onset May 2019         LYMPHEDEMA/ONCOLOGY QUESTIONNAIRE - 01/03/18 0921      Left Lower Extremity Lymphedema   Other  LLE A-D segement is decreased by 42.93% since commencing OT for CDT. LLE E-G segment is decreased by 24.78% overall. LLE A-G (full leg ) volume is reduced by 32.03%. tHESE VALUES MEET AND EXCEED 10% LIMB VOLUME REDUCTION GOAL!              OT Treatments/Exercises (OP) - 01/03/18 0001      ADLs   ADL Education Given  Yes      Manual Therapy   Manual Therapy  Edema management;Manual Lymphatic Drainage (MLD);Compression Bandaging    Manual therapy comments  completed LLE comparative limb volumetrics    Edema Management  skin care to LLE w/ low ph castor oil and Eucerin lotion during MLD    Manual Lymphatic Drainage (MLD)  MLD in supine to LLE using short neck sequence. deep abdominal pathway and functional inguinal LN. Minimal stroked to medial mid to distal L leg due to soreness and pain with palpation.     Compression Bandaging  LLE thigh  length compression wrap as established. Costom thigh length compression garment to RLE w/ garment adhesive on a trial basis.              OT Education - 01/03/18  1217    Education Details  Continued skilled Pt/caregiver education  And LE ADL training throughout visit for lymphedema self care/ home program, including compression wrapping, compression garment and device wear/care, lymphatic pumping ther ex, simple self-MLD, and skin care. Discussed progress towards goals.     Person(s) Educated  Patient;Child(ren)    Methods  Explanation;Demonstration    Comprehension  Verbalized understanding;Returned demonstration;Need further instruction          OT Long Term Goals - 12/13/17 0809      OT LONG TERM GOAL #1   Title  Pt modified independent w/ lymphedema precautions/prevention principals and using printed reference to limit LE progression and infection risk.    Baseline  Max A Met 10/25/17     Time  2    Period  Weeks    Status  Achieved      OT LONG TERM GOAL #2   Title  Lymphedema (LE) management/ self-care: Pt able to apply knee length gradient compression wraps with maximum caregiver assistance daily between OT visits using correct gradient techniques within 2 weeks to achieve optimal limb volume reduction during Intensive Phase of CDT and optimal self-management over time.    Baseline  Dependent Goal MET 11/01/17 for full length compression wraps. CG is independent and uses excellent technique.    Time  2    Period  Weeks    Status  Achieved      OT LONG TERM GOAL #3   Title  Lymphedema (LE) management/ self-care:  Pt to achieve at least 10%  limb volume reduction in BLE  during Intensive Phase CDT to limit LE progression, to decrease infection risk and to improve functional mobility and ambulation essential for optimal ADLs performance.    Baseline  dependent  11/01/17 Goal met and exceeded for RLE.  11/27/17 : LLE well reduced below the knee.    Time  12    Period  Weeks    Status  Partially Met      OT LONG TERM GOAL #4   Title  Lymphedema (LE) management/ self-care:  Pt to tolerate daily compression garments and/ or HOS devices in keeping w/ prescribed wear regime within 1 week of issue date to restore normal limb shape to reduce tissue density and protein build up leading to progression.    Baseline  Dependent    Time  12    Period  Weeks    Status  New      OT LONG TERM GOAL #5   Title  Lymphedema management/ Self-care: Pt able to don and doff compression garments and/ or devices with maximum assistance using assistive devices and extra times PRN in order to ensure optimal lymphedema management over time to limit progression risk and infection.    Baseline  Dependent    Time  12    Period  Weeks    Status  New      OT LONG TERM GOAL #6   Title  Lymphedema (LE) management/ self-care:  During Management Phase CDT Pt to sustain reduced limb volumes achieved during  Intensive Phase CDT at follow up visits during Management Phase CDT within 3% using LE self-care home program components as directed ( simple self MLD, skin care, ther ex and daily/ nightly compression garments/ devices) .    Baseline  dependent    Time  6    Period  Months    Status  New  Plan - 01/03/18 0915    Clinical Impression Statement  Pt 100% compliant w/ LE home program during 1 week visit interval. Completed LLE comparative limb volumetrics to assess volume reduction achieved since initially me4asuring LLE on 10/15/17. LLE ankle to below knee (A-D) limb volume = 3767.34. LLE knee to groin (E-G) volume = 7453.66l. LLE full leg (A-G) volume =11223.0 ML. Applied cmopression wraps to LLE as establishe4d after Rx. Pt tolerated all aspects of CDT today without difficulty. She continues to make excellent progress towards all goals. Complete custom LLE knee length compression garments next session if volumes are stable.    Occupational Profile and client history currently impacting functional performance  lives with son    as is unable to climb stairs at entrance of daughter's home; plans to return to home in [place] in October 2019; unsure about assistance in [place] as all adults children reside here    Occupational performance deficits (Please refer to evaluation for details):  ADL's;IADL's;Leisure;Social Participation    Rehab Potential  Fair    Current Impairments/barriers affecting progress:  Without max caregiver assistance with LE self-care home program between visits prognosis for improvement is poor    OT Frequency  2x / week    OT Duration  12 weeks   and PRN due to severity of BLE   OT Treatment/Interventions  Self-care/ADL training;Therapeutic exercise;Functional Mobility Training;Manual Therapy;Manual lymph drainage;Therapeutic activities;Coping strategies training;DME and/or AE instruction;Compression bandaging;Other (comment);Patient/family education   fit with  custom, BLE compression garments and hours of sleep devices   Plan  CDT includes manual lymphatic drainage (MLD), skin care, compression wrapping and garment fitting, therapeutic exercise and emphasis on Pt and CG self care training    Clinical Decision Making  Multiple treatment options, significant modification of task necessary    Consulted and Agree with Plan of Care  Patient;Family member/caregiver       Patient will benefit from skilled therapeutic intervention in order to improve the following deficits and impairments:  Abnormal gait, Decreased endurance, Decreased skin integrity, Cardiopulmonary status limiting activity, Decreased knowledge of precautions, Impaired perceived functional ability, Decreased activity tolerance, Decreased knowledge of use of DME, Decreased strength, Decreased balance, Decreased mobility, Difficulty walking, Obesity, Decreased range of motion, Increased edema, Pain  Visit Diagnosis: Lymphedema, not elsewhere classified    Problem List Patient Active Problem List   Diagnosis Date Noted  . AKI (acute kidney injury) ([place])   . Acute CHF (congestive heart failure) ([place]) 08/28/2017  . Hypertensive emergency 08/28/2017  . Obesity, Class III, BMI 40-49.9 (morbid obesity) ([place]) 08/28/2017  . Hypokalemia 08/28/2017  . Acute on chronic combined systolic and diastolic CHF (congestive heart failure) ([place])   . Persistent atrial fibrillation   . Essential hypertension   . Stage 3 chronic kidney disease ([place])   . Hypercholesterolemia 12/17/2015  . Routine health maintenance 07/23/2015  . 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 [place], [place], [address] Phone: [phone]   Fax:  [phone]  Name: [name] Long MRN: [id number] Date of Birth: 1940-12-22

## 2018-01-08 ENCOUNTER — Encounter: Payer: Medicare Other | Admitting: Occupational Therapy

## 2018-01-10 ENCOUNTER — Ambulatory Visit: Payer: Medicare Other | Attending: Orthopedic Surgery | Admitting: Occupational Therapy

## 2018-01-10 DIAGNOSIS — I890 Lymphedema, not elsewhere classified: Secondary | ICD-10-CM | POA: Diagnosis present

## 2018-01-10 NOTE — Therapy (Signed)
[place] MAIN [facility] SERVICES [address] [place], [place], [address] Phone: [phone]   Fax:  [phone]  Occupational Therapy Treatment  Patient Details  Name: [name] Long MRN: [id number] Date of Birth: May 13, 1940 No data recorded  Encounter Date: 01/10/2018  OT End of Session - 01/10/18 1218    Visit Number  33    Number of Visits  36    Date for OT Re-Evaluation  01/08/18    OT Start Time  0801    OT Stop Time  0900    OT Time Calculation (min)  59 min    Activity Tolerance  Patient tolerated treatment well;No increased pain    Behavior During Therapy  WFL for tasks assessed/performed       Past Medical History:  Diagnosis Date  . Atrial fibrillation ([place])   . Hypertension     Past Surgical History:  Procedure Laterality Date  . CESAREAN SECTION      There were no vitals filed for this visit.  Subjective Assessment - 01/10/18 1215    Subjective   Pt presents for OT visit 33/36 to address BLE LE. Pt is accompanied by her daughter.Pt presents wearing remade RLE compression garment which arrived yesterday. Pt and daughter report that they have been managing LE very well everyday during 1 week visit intervals.    Patient is accompained by:  Family member    Pertinent History  contributing comorbidities include: acute CHF, persistent Afib, HTN, Stage III CKD, obesity, class III    Limitations  difficulty walking, fall risk, chronic, progressive BLE swelling and    pain,  impaired transfers and functional mobility, decreased activity tolerance, impaired balance, limitted standing tolerance- dependent position exacerbates BLE swelling and discomfort, impaired stair climbing (difficulty lifting legs), BLE weakness, difficulty fitting LB clothing and street shoes due to progressive LE swelling, impaired basic ADLs, LB dressing, bathing, skin and nail care, diabetic skin infection)    Currently in Pain?  No/denies    Pain Onset  In the  past 7 days   onset May 2019                  OT Treatments/Exercises (OP) - 01/10/18 0001      ADLs   ADL Education Given  Yes      Manual Therapy   Manual Therapy  Edema management;Compression Bandaging    Manual therapy comments  Completed initial anatomical measurements for LLE compr4ession stocking. New fitting and assessment for remade  RLE compression knee high.     Edema Management  skin care to LLE w/ low ph castor oil and Eucerin lotion during MLD    Compression Bandaging  LLE thigh  length compression wrap as established. Costom thigh length compression garment to RLE w/ garment adhesive on a trial basis.              OT Education - 01/10/18 1217    Education Details  Pt and family edu for donning and doffing custom knee length copmpressi9on garment using assistive devices and seated positi9oning.     Person(s) Educated  Patient;Child(ren)    Methods  Explanation;Demonstration    Comprehension  Verbalized understanding;Returned demonstration;Need further instruction          OT Long Term Goals - 12/13/17 0809      OT LONG TERM GOAL #1   Title  Pt modified independent w/ lymphedema precautions/prevention principals and using printed reference to limit LE progression and infection risk.  Baseline  Max A Met 10/25/17    Time  2    Period  Weeks    Status  Achieved      OT LONG TERM GOAL #2   Title  Lymphedema (LE) management/ self-care: Pt able to apply knee length gradient compression wraps with maximum caregiver assistance daily between OT visits using correct gradient techniques within 2 weeks to achieve optimal limb volume reduction during Intensive Phase of CDT and optimal self-management over time.    Baseline  Dependent Goal MET 11/01/17 for full length compression wraps. CG is independent and uses excellent technique.    Time  2    Period  Weeks    Status  Achieved      OT LONG TERM GOAL #3   Title  Lymphedema (LE) management/ self-care:   Pt to achieve at least 10%  limb volume reduction in BLE  during Intensive Phase CDT to limit LE progression, to decrease infection risk and to improve functional mobility and ambulation essential for optimal ADLs performance.    Baseline  dependent  11/01/17 Goal met and exceeded for RLE.  11/27/17 : LLE well reduced below the knee.    Time  12    Period  Weeks    Status  Partially Met      OT LONG TERM GOAL #4   Title  Lymphedema (LE) management/ self-care:  Pt to tolerate daily compression garments and/ or HOS devices in keeping w/ prescribed wear regime within 1 week of issue date to restore normal limb shape to reduce tissue density and protein build up leading to progression.    Baseline  Dependent    Time  12    Period  Weeks    Status  New      OT LONG TERM GOAL #5   Title  Lymphedema management/ Self-care: Pt able to don and doff compression garments and/ or devices with maximum assistance using assistive devices and extra times PRN in order to ensure optimal lymphedema management over time to limit progression risk and infection.    Baseline  Dependent    Time  12    Period  Weeks    Status  New      OT LONG TERM GOAL #6   Title  Lymphedema (LE) management/ self-care:  During Management Phase CDT Pt to sustain reduced limb volumes achieved during Intensive Phase CDT at follow up visits during Management Phase CDT within 3% using LE self-care home program components as directed ( simple self MLD, skin care, ther ex and daily/ nightly compression garments/ devices) .    Baseline  dependent    Time  6    Period  Months    Status  New            Plan - 01/10/18 1219    Clinical Impression Statement  New remade RLE compression garment fits very well. Pt is comfortable in garment and is able to don and doff using assistive devices with extra time. Knee length garment stays ion place with 2.5 silicone top band, vs thigh length original which would not remain in place at top edge.  Completed initial anatomical measurements to LLE knee length garment and submitted to DME vendor. Applied LLE thigh length compression wraps. as established. Pt will return when garment arrives in effort to conserve visits.    Occupational Profile and client history currently impacting functional performance  lives with son    as is unable to climb stairs  at entrance of daughter's home; plans to return to home in [facility] in October 2019; unsure about assistance in [place] as all adults children reside here    Occupational performance deficits (Please refer to evaluation for details):  ADL's;IADL's;Leisure;Social Participation    Rehab Potential  Fair    Current Impairments/barriers affecting progress:  Without max caregiver assistance with LE self-care home program between visits prognosis for improvement is poor    OT Frequency  2x / week    OT Duration  12 weeks   and PRN due to severity of BLE   OT Treatment/Interventions  Self-care/ADL training;Therapeutic exercise;Functional Mobility Training;Manual Therapy;Manual lymph drainage;Therapeutic activities;Coping strategies training;DME and/or AE instruction;Compression bandaging;Other (comment);Patient/family education   fit with custom, BLE compression garments and hours of sleep devices   Plan  CDT includes manual lymphatic drainage (MLD), skin care, compression wrapping and garment fitting, therapeutic exercise and emphasis on Pt and CG self care training    Clinical Decision Making  Multiple treatment options, significant modification of task necessary    Consulted and Agree with Plan of Care  Patient;Family member/caregiver       Patient will benefit from skilled therapeutic intervention in order to improve the following deficits and impairments:  Abnormal gait, Decreased endurance, Decreased skin integrity, Cardiopulmonary status limiting activity, Decreased knowledge of precautions, Impaired perceived functional ability, Decreased activity  tolerance, Decreased knowledge of use of DME, Decreased strength, Decreased balance, Decreased mobility, Difficulty walking, Obesity, Decreased range of motion, Increased edema, Pain  Visit Diagnosis: Lymphedema, not elsewhere classified    Problem List Patient Active Problem List   Diagnosis Date Noted  . AKI (acute kidney injury) ([place])   . Acute CHF (congestive heart failure) ([place]) 08/28/2017  . Hypertensive emergency 08/28/2017  . Obesity, Class III, BMI 40-49.9 (morbid obesity) ([place]) 08/28/2017  . Hypokalemia 08/28/2017  . Acute on chronic combined systolic and diastolic CHF (congestive heart failure) ([place])   . Persistent atrial fibrillation   . Essential hypertension   . Stage 3 chronic kidney disease ([place])   . Hypercholesterolemia 12/17/2015  . Routine health maintenance 07/23/2015  . 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 [place], [place], [address] Phone: [phone]   Fax:  [phone]  Name: Jennye Runquist MRN: [id number] Date of Birth: 1940-12-30

## 2018-01-17 ENCOUNTER — Ambulatory Visit: Payer: Medicare Other | Admitting: Occupational Therapy

## 2018-01-22 ENCOUNTER — Ambulatory Visit: Payer: Medicare Other | Admitting: Occupational Therapy

## 2018-01-22 DIAGNOSIS — I890 Lymphedema, not elsewhere classified: Secondary | ICD-10-CM

## 2018-01-22 NOTE — Patient Instructions (Signed)

## 2018-01-22 NOTE — Therapy (Signed)
[place] MAIN [facility] SERVICES [address] [place], [place], [address] Phone: [phone]   Fax:  [phone]  Occupational Therapy Treatment Note and Discharge Summary  Patient Details  Name: [name] Long MRN: [id number] Date of Birth: 1940-09-11 No data recorded  Encounter Date: 01/22/2018  OT End of Session - 01/22/18 1650    Visit Number  34    Number of Visits  36    Date for OT Re-Evaluation  01/08/18    OT Start Time  0800    OT Stop Time  0900    OT Time Calculation (min)  60 min    Activity Tolerance  Patient tolerated treatment well;No increased pain    Behavior During Therapy  WFL for tasks assessed/performed       Past Medical History:  Diagnosis Date  . Atrial fibrillation ([place])   . Hypertension     Past Surgical History:  Procedure Laterality Date  . CESAREAN SECTION      There were no vitals filed for this visit.  Subjective Assessment - 01/22/18 1647    Subjective   Pt presents for OT visit 34/36 to address BLE LE. Pt is accompanied by her daughter.Pt presents wearing new LLE knee length compression garment. "It came last Thursday and she has been wearing it ever since. She can get them on and off by herself. "?    Patient is accompained by:  Family member    Pertinent History  contributing comorbidities include: acute CHF, persistent Afib, HTN, Stage III CKD, obesity, class III    Limitations  difficulty walking, fall risk, chronic, progressive BLE swelling and    pain,  impaired transfers and functional mobility, decreased activity tolerance, impaired balance, limitted standing tolerance- dependent position exacerbates BLE swelling and discomfort, impaired stair climbing (difficulty lifting legs), BLE weakness, difficulty fitting LB clothing and street shoes due to progressive LE swelling, impaired basic ADLs, LB dressing, bathing, skin and nail care, diabetic skin infection)    Currently in Pain?  No/denies    Pain  Onset  In the past 7 days   onset May 2019         LYMPHEDEMA/ONCOLOGY QUESTIONNAIRE - 01/22/18 1651      Right Lower Extremity Lymphedema   Other  RLE ankle to below knee (A-D) limb volume = 4337.90. ml. RLE knee to groin (E-G) volume = 6746.30 ml. RLE full leg (A-G) volume = 10223.49 ML.    Other  R leg (A-D)   total limb vol reduction  measures 30.61% since commencing OT for CDT on 10/15/2017.  R thigh (E-G) reduction measures 39.73% overall, and full leg limb volume reduction measures 41.39% overall since commencing CDT.  Pt met and exceeded 10% volume reduction goal.       Left Lower Extremity Lymphedema   Other  LLE ankle to below knee (A-D) limb volume =3722.71 ml. LLE knee to groin (E-G) volume = 8154.43 ml. RLE full leg (A-G) volume =16512.99 ml. . LLE leg volume is reduced by 43.60% overall since commencing OT for CDT. LLE thigh segment (E-G) is decreased by 17.73%; and LLE full leg volume reduction measures 28.07%.              OT Treatments/Exercises (OP) - 01/22/18 0001      ADLs   ADL Education Given  Yes      Manual Therapy   Manual Therapy  Edema management;Compression Bandaging    Manual therapy comments  Completed final BLE  comparative limb volumetrics.    Compression Bandaging  BLE , custom, flat knit, Jobst ELVAREX, ccl 2 ( 25-35 mmHg) compression knee highs             OT Education - 01/22/18 1718    Education Details  Continued skilled Pt/caregiver education  And LE ADL training throughout visit for lymphedema self care/ home program, including compression wrapping, compression garment and device wear/care, lymphatic pumping ther ex, simple self-MLD, and skin care. Discussed progress towards goals.     Person(s) Educated  Patient;Child(ren)    Methods  Explanation;Demonstration    Comprehension  Verbalized understanding;Returned demonstration;Need further instruction          OT Long Term Goals - 01/22/18 1719      OT LONG TERM GOAL #1    Title  Pt modified independent w/ lymphedema precautions/prevention principals and using printed reference to limit LE progression and infection risk.    Baseline  goal met    Time  2    Period  Weeks    Status  Achieved      OT LONG TERM GOAL #2   Title  Lymphedema (LE) management/ self-care: Pt able to apply knee length gradient compression wraps with maximum caregiver assistance daily between OT visits using correct gradient techniques within 2 weeks to achieve optimal limb volume reduction during Intensive Phase of CDT and optimal self-management over time.    Baseline  Goal met    Time  2    Period  Weeks    Status  Achieved      OT LONG TERM GOAL #3   Title  Lymphedema (LE) management/ self-care:  Pt to achieve at least 10%  limb volume reduction in BLE  during Intensive Phase CDT to limit LE progression, to decrease infection risk and to improve functional mobility and ambulation essential for optimal ADLs performance.    Baseline  Goal met     Time  12    Period  Weeks    Status  Achieved      OT LONG TERM GOAL #4   Title  Lymphedema (LE) management/ self-care:  Pt to tolerate daily compression garments and/ or HOS devices in keeping w/ prescribed wear regime within 1 week of issue date to restore normal limb shape to reduce tissue density and protein build up leading to progression.    Baseline  Dependent    Time  12    Period  Weeks    Status  Achieved      OT LONG TERM GOAL #5   Title  Lymphedema management/ Self-care: Pt able to don and doff compression garments and/ or devices with maximum assistance using assistive devices and extra times PRN in order to ensure optimal lymphedema management over time to limit progression risk and infection.    Baseline  Dependent  Met and exceeded. Goal met with modified independence    Time  12    Period  Weeks    Status  Achieved      OT LONG TERM GOAL #6   Title  Lymphedema (LE) management/ self-care:  During Management Phase CDT  Pt to sustain reduced limb volumes achieved during Intensive Phase CDT at follow up visits during Management Phase CDT within 3% using LE self-care home program components as directed ( simple self MLD, skin care, ther ex and daily/ nightly compression garments/ devices) .    Baseline  dependent    Time  6    Period  Months    Status  On-going            Plan - 01/22/18 1719    Clinical Impression Statement  RLE ankle to below knee (A-D) limb volume = 4337.90. ml. RLE knee to groin (E-G) volume = 6746.30 ml. RLE full leg (A-G) volume = 10223.49 ml.  R leg (A-D)   total limb vol reduction measures 30.61% since commencing OT for CDT on 10/15/2017.  R thigh (E-G) reduction measures 39.73% overall, and R full leg limb volume reduction measures 41.39% overall since commencing CDT . LLE ankle to below knee (A-D) limb volume =3722.71 ml. LLE knee to groin (E-G) volume = 8154.43 ml. LLE full leg (A-G) volume =16512.99 ml.  L leg volume is reduced by 43.60% overall since commencing OT for CDT. L thigh (E-G) volume is decreased by 17.73%; and L full leg volume reduction measures 28.07%. Pt has met and exceeded 10% limb volume reduction goals by remarkable amounts. In terms of functional gains   secondary to these volume reductions, Pt reports she is able to stand for longer and with less discomfort when cooking and completing household chores. She and her daughter are now able to go shopping together, a favorite pastime.  Pt demonstrates more fluid gait, improved balance , increased safety and independence with transfers, improved bed mobility and decreased falls risk. She is able to don and doff compression garments with modified independence using assistive devices. She is independent and 100% compliant with all LE self-care protocols, including compression, ther ex, skin care and simple self-MLD. Pt has increased her activity level and has reduced her weight by 14 pounds. She is fit with appropriate flat knit,  custom compression garments which provide adequate control of swelling. Pt and family are in agreement with plan to discharge OT today. All goals met. Should Pt need follow-along OT care for LE management, I am happy to assist her. I wish Mrs Myren the best of health  in the future.    Occupational Profile and client history currently impacting functional performance  lives with son    as is unable to climb stairs at entrance of daughter's home; plans to return to home in [place] in October 2019; unsure about assistance in [place] as all adults children reside here    Occupational performance deficits (Please refer to evaluation for details):  ADL's;IADL's;Leisure;Social Participation    Rehab Potential  Fair    Current Impairments/barriers affecting progress:  Without max caregiver assistance with LE self-care home program between visits prognosis for improvement is poor    OT Frequency  2x / week    OT Duration  12 weeks   and PRN due to severity of BLE   OT Treatment/Interventions  Self-care/ADL training;Therapeutic exercise;Functional Mobility Training;Manual Therapy;Manual lymph drainage;Therapeutic activities;Coping strategies training;DME and/or AE instruction;Compression bandaging;Other (comment);Patient/family education   fit with custom, BLE compression garments and hours of sleep devices   Plan  CDT includes manual lymphatic drainage (MLD), skin care, compression wrapping and garment fitting, therapeutic exercise and emphasis on Pt and CG self care training    Clinical Decision Making  Multiple treatment options, significant modification of task necessary    Consulted and Agree with Plan of Care  Patient;Family member/caregiver       Patient will benefit from skilled therapeutic intervention in order to improve the following deficits and impairments:  Abnormal gait, Decreased endurance, Decreased skin integrity, Cardiopulmonary status limiting activity, Decreased knowledge of  precautions, Impaired perceived functional  ability, Decreased activity tolerance, Decreased knowledge of use of DME, Decreased strength, Decreased balance, Decreased mobility, Difficulty walking, Obesity, Decreased range of motion, Increased edema, Pain  Visit Diagnosis: Lymphedema, not elsewhere classified    Problem List Patient Active Problem List   Diagnosis Date Noted  . AKI (acute kidney injury) ([place])   . Acute CHF (congestive heart failure) ([place]) 08/28/2017  . Hypertensive emergency 08/28/2017  . Obesity, Class III, BMI 40-49.9 (morbid obesity) ([place]) 08/28/2017  . Hypokalemia 08/28/2017  . Acute on chronic combined systolic and diastolic CHF (congestive heart failure) ([place])   . Persistent atrial fibrillation   . Essential hypertension   . Stage 3 chronic kidney disease ([place])   . Hypercholesterolemia 12/17/2015  . Routine health maintenance 07/23/2015  . 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 [place], [place], [address] Phone: [phone]   Fax:  [phone]  Name: Santa Abdelrahman MRN: [id number] Date of Birth: [birth year]

## 2018-01-24 ENCOUNTER — Ambulatory Visit: Payer: Medicare Other | Admitting: Occupational Therapy

## 2019-01-03 ENCOUNTER — Other Ambulatory Visit: Payer: Self-pay

## 2019-01-03 DIAGNOSIS — Z20822 Contact with and (suspected) exposure to covid-19: Secondary | ICD-10-CM

## 2019-01-05 LAB — NOVEL CORONAVIRUS, NAA: SARS-CoV-2, NAA: NOT DETECTED

## 2019-08-28 IMAGING — US US RENAL
1 series · 14 of 25 positions shown · non-contrast
Comparison: None.

CLINICAL DATA: Acute kidney injury.

EXAM:
RENAL / URINARY TRACT ULTRASOUND COMPLETE

[Series 1: us renal · 0.22mm/px · 14 of 65 slices shown]
[im 1/65]
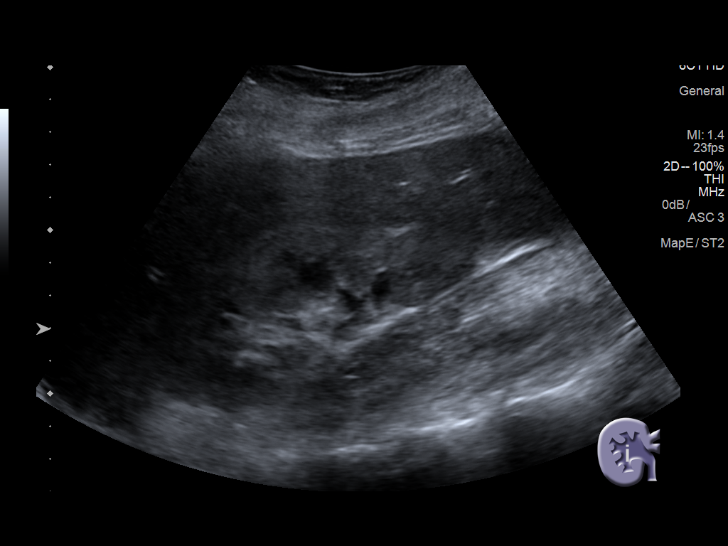
[im 6/65]
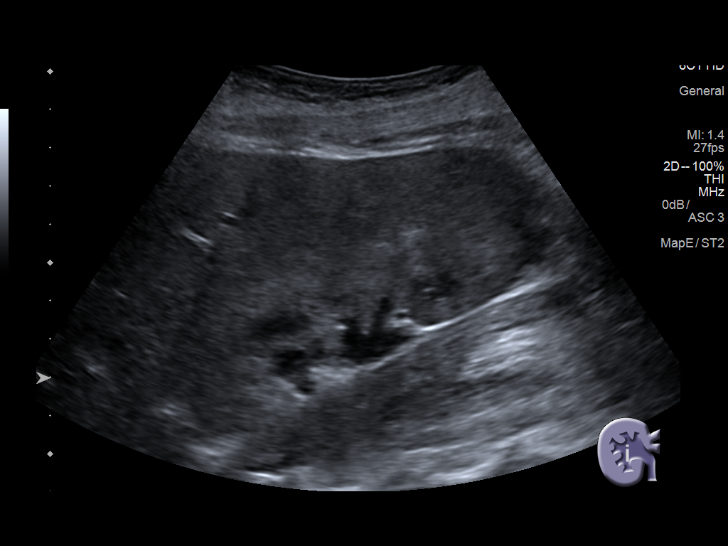
[im 11/65]
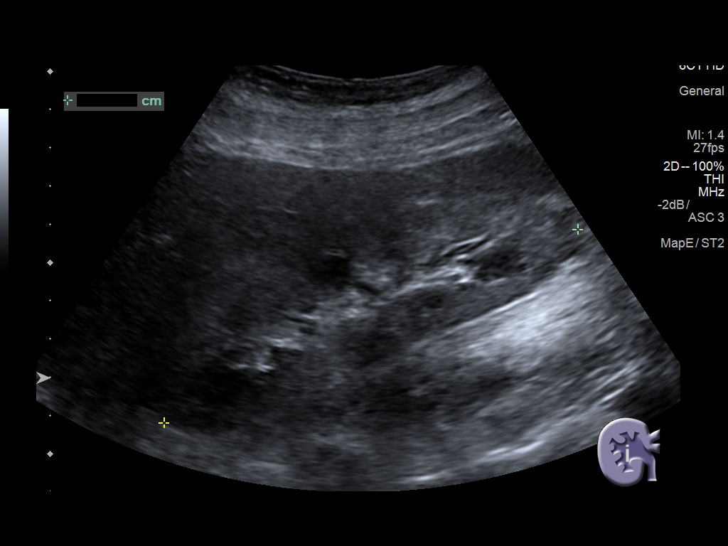
[im 17/65]
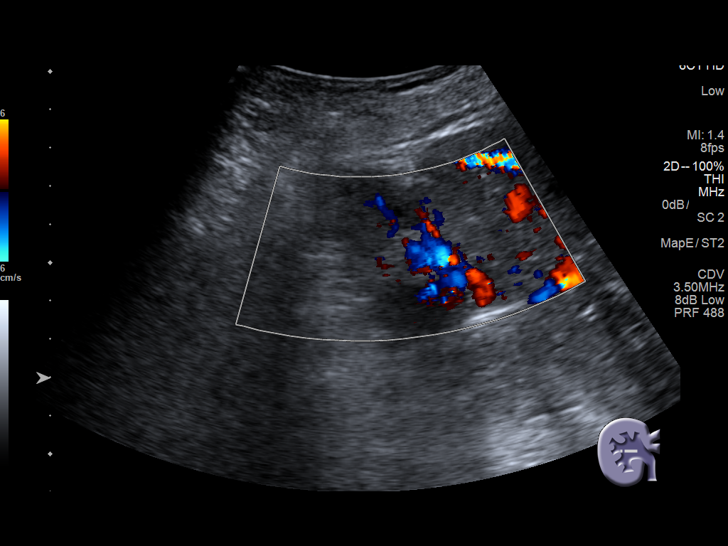
[im 22/65]
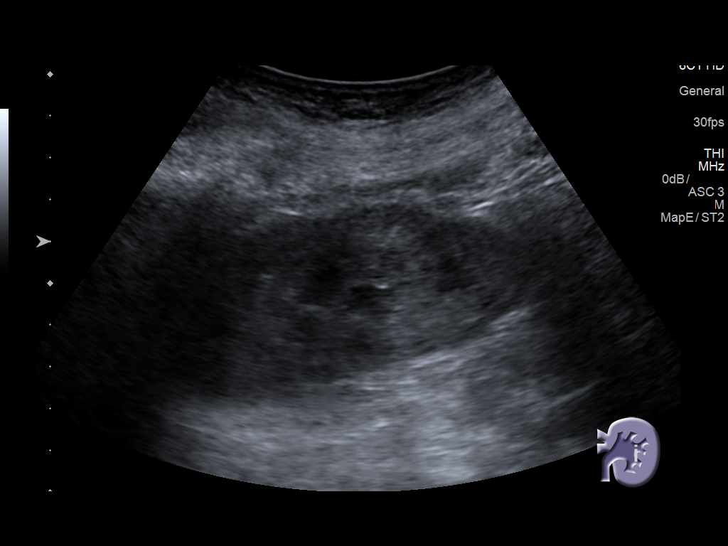
[im 25/65]
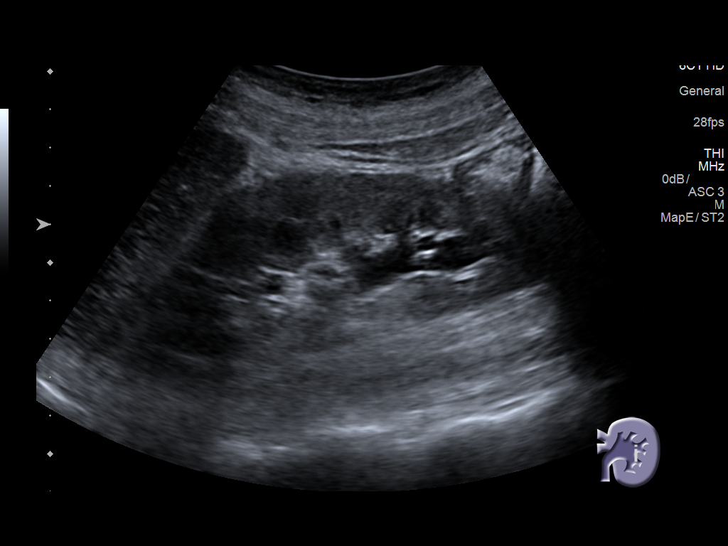
[im 30/65]
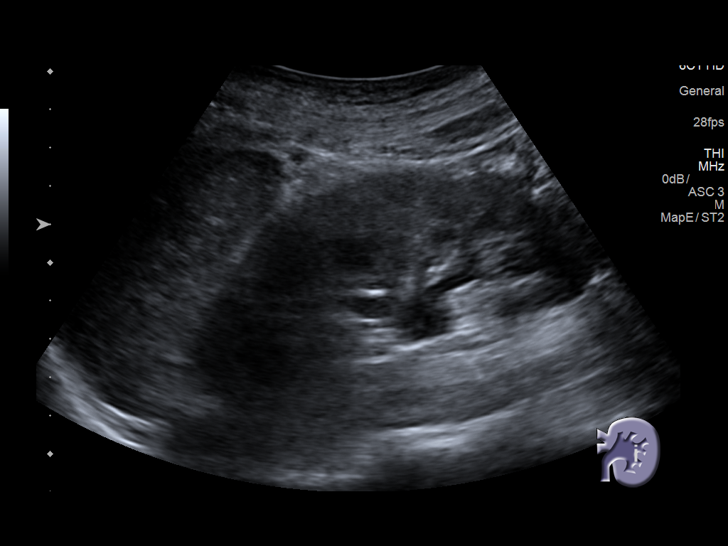
[im 35/65]
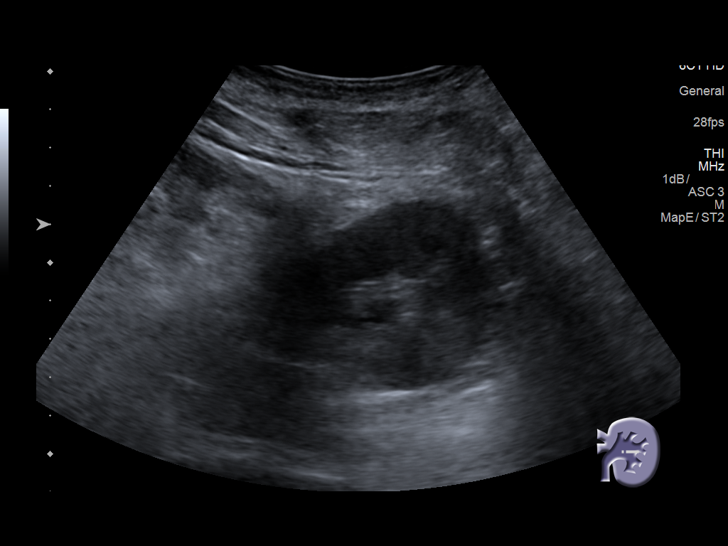
[im 41/65]
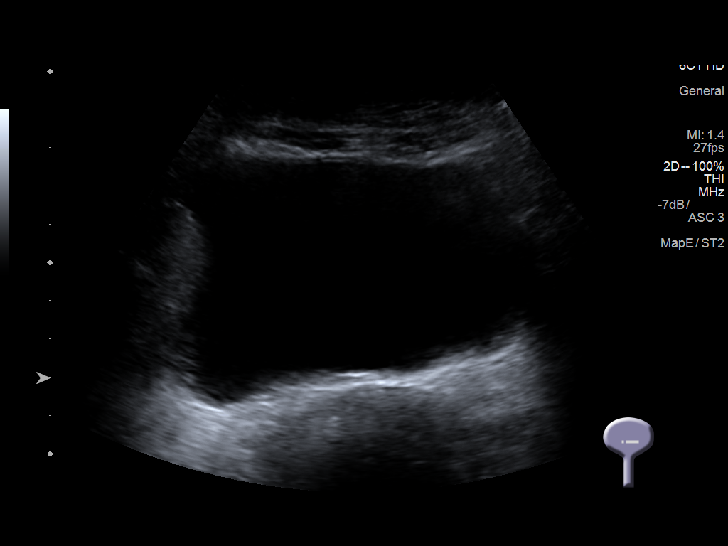
[im 43/65]
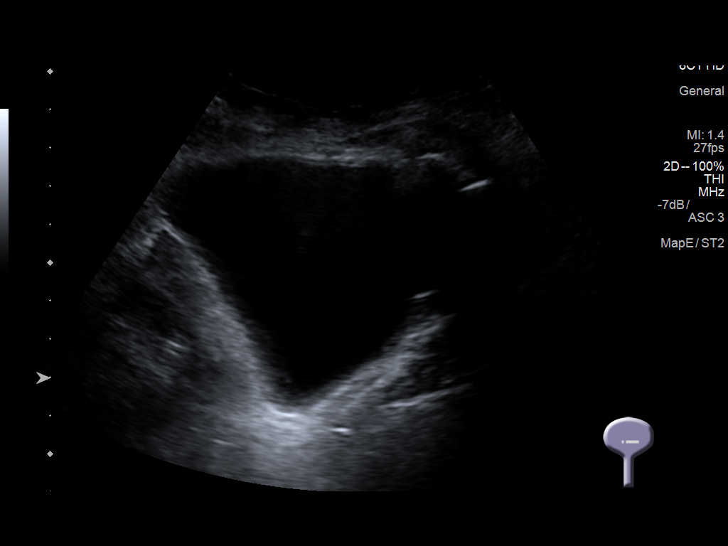
[im 49/65]
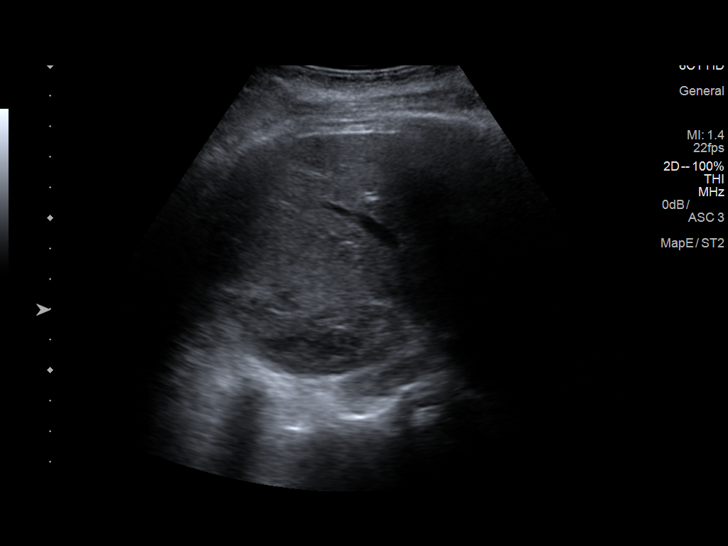
[im 54/65]
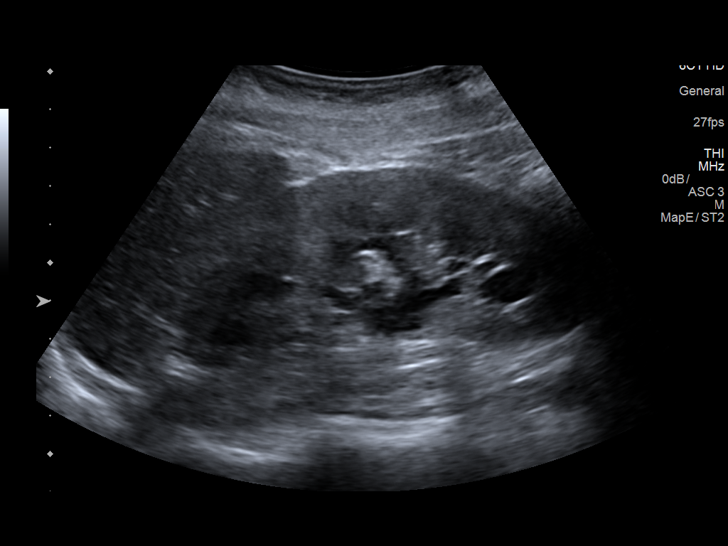
[im 59/65]
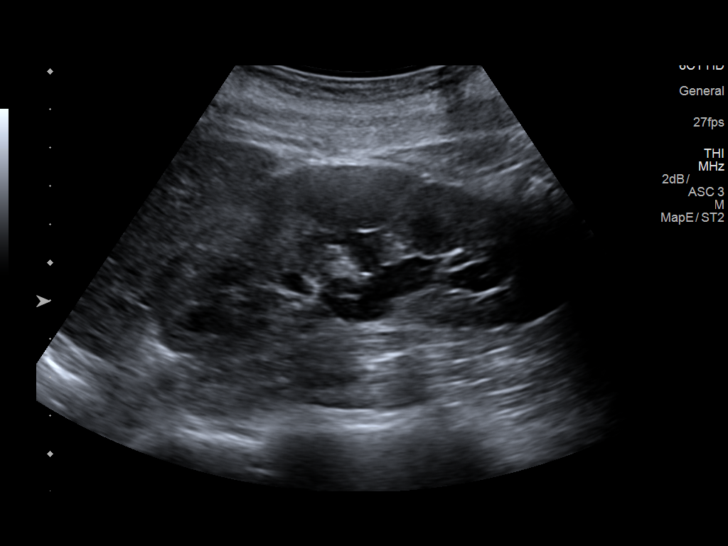
[im 65/65]
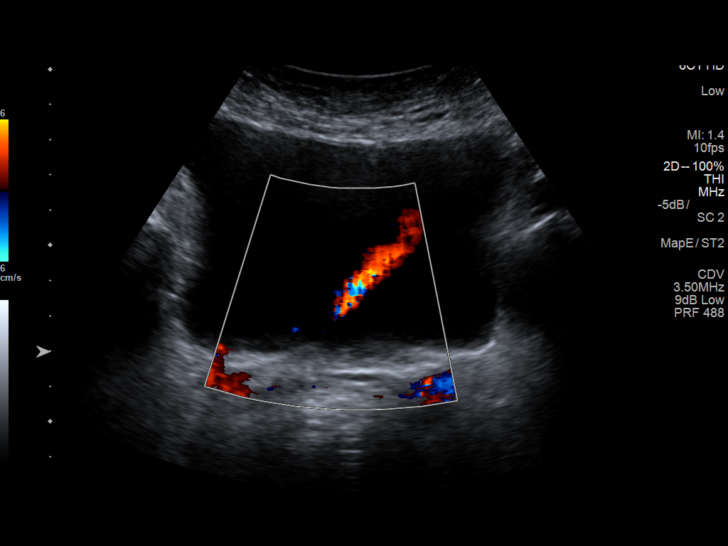

[14 of 25 positions shown; findings below may reference images not displayed]

FINDINGS: Right Kidney:

Length: 12.0 cm. No hydronephrosis. No focal renal parenchymal
lesion. Echogenicity slightly increased.

Left Kidney:

Length: 10.9 cm. Mild fullness of the renal collecting system. No
focal renal lesions seen. Echogenicity slightly increased.

Bladder:

Bilateral ureteral jets evident.
IMPRESSION: Kidneys show normal size but slightly increased echogenicity.

Mild fullness of the renal collecting system on the left,
significance doubtful, particularly given the presence of bilateral
ureteral jets.

## 2019-09-15 ENCOUNTER — Telehealth: Payer: Self-pay

## 2019-09-15 NOTE — Telephone Encounter (Signed)
NOTES ON FILE FROM  [facility] [phone] SENT REFERRAL TO SCHEDULING

## 2019-09-18 ENCOUNTER — Telehealth: Payer: Self-pay

## 2019-09-18 NOTE — Telephone Encounter (Signed)
 [place], SENT REFERRAL TO [place]

## 2019-09-22 ENCOUNTER — Other Ambulatory Visit: Payer: Self-pay | Admitting: Physician Assistant

## 2019-09-22 DIAGNOSIS — I059 Rheumatic mitral valve disease, unspecified: Secondary | ICD-10-CM

## 2019-09-24 ENCOUNTER — Ambulatory Visit (HOSPITAL_COMMUNITY): Payer: Medicare Other | Attending: Cardiovascular Disease

## 2019-09-24 ENCOUNTER — Other Ambulatory Visit: Payer: Self-pay

## 2019-09-24 DIAGNOSIS — I059 Rheumatic mitral valve disease, unspecified: Secondary | ICD-10-CM | POA: Insufficient documentation

## 2019-09-24 LAB — ECHOCARDIOGRAM COMPLETE: S' Lateral: 4 cm

## 2019-09-25 ENCOUNTER — Encounter: Payer: Self-pay | Admitting: Cardiovascular Disease

## 2019-09-25 ENCOUNTER — Ambulatory Visit (INDEPENDENT_AMBULATORY_CARE_PROVIDER_SITE_OTHER): Payer: Medicare Other | Admitting: Cardiovascular Disease

## 2019-09-25 VITALS — BP 122/82 | HR 66 | Ht 64.0 in | Wt 171.4 lb

## 2019-09-25 DIAGNOSIS — I059 Rheumatic mitral valve disease, unspecified: Secondary | ICD-10-CM | POA: Diagnosis not present

## 2019-09-25 DIAGNOSIS — I071 Rheumatic tricuspid insufficiency: Secondary | ICD-10-CM

## 2019-09-25 NOTE — Progress Notes (Signed)
Cardiology Office Note:    Date:  09/26/2019   ID:  [name] Long, DOB 10/28/1940, MRN [id number]  PCP:  Patient, No Pcp Per  [place] Cardiologist:  No primary care provider on file.  [facility] Electrophysiologist:  None   Referring MD: No ref. provider found   Chief Complaint  Patient presents with  . Shortness of Breath    History of Present Illness:    [name] Long is a 79 y.o. female referred for evaluation of mitral and tricuspid regurgitation.   The patient is here with her daughter today. The patient lives in [place], [place], and her daughter lives here in [place]. The patient has longstanding HTN and diabetes, both under good control per her report. Within the past few years she has been diagnosed with atrial fibrillation and she is anticoagulated with apixaban. She denies any bleeding problems and is tolerating long term anticoagulation. She has undergone treatment of chronic lymphedema in her legs with good success. She now treats this with compression stocking and states that it is 'under control.' She has been followed at the [facility] for MGUS but hasn't required any specific treatment and hasn't been seen there in the past few years.   She lives with her husband in [place], and remains functionally independent. She tells me that she cooks and cleans without shortness of breath. She denies chest pain or pressure. She can go up her stairs in her home without symptoms. She sleeps on 1 pillow and denies orthopnea or PND.   The patient was hospitalized at [facility] with heart failure in 2019.  These records are reviewed today.  She was admitted with marked edema and responded well to IV diuresis.  She was noted to have cardiomyopathy and atrial fibrillation at that time.  The patient was initiated on oral anticoagulation with apixaban.  She has not been previously considered for cardioversion.  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  In  reviewing the notes, because of marked biatrial enlargement she was felt to be a better candidate for rate control and anticoagulation rather than trying to restore sinus rhythm.  She has been followed by her home cardiologist since that time and really has done quite well.  Past Medical History:  Diagnosis Date  . Atrial fibrillation ([place])   . 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 DM (diabetes mellitus) ([place])   . Dyspnea   . Hypertension   . Lymphedema   . MGUS (monoclonal gammopathy of unknown significance)   . Mitral regurgitation   . Pedal edema   . PVC (premature ventricular contraction)     Past Surgical History:  Procedure Laterality Date  . CESAREAN SECTION      Current Medications: Current Meds  Medication Sig  . apixaban (ELIQUIS) 5 MG TABS tablet Take 5 mg by mouth 2 (two) times daily.  . brimonidine (ALPHAGAN) 0.2 % ophthalmic solution Place 1 drop into both eyes 2 (two) times daily.  . carvedilol (COREG) 25 MG tablet Take 25 mg by mouth 2 (two) times daily with a meal.  . furosemide (LASIX) 80 MG tablet Take 1 tablet (80 mg total) by mouth daily.  . isosorbide-hydrALAZINE (BIDIL) 20-37.5 MG tablet Take 1 tablet by mouth 3 (three) times daily.  Marland Kitchen latanoprost (XALATAN) 0.005 % ophthalmic solution Place 1 drop into both eyes at bedtime.  . sacubitril-valsartan (ENTRESTO) 49-51 MG Take 1 tablet by mouth daily in the afternoon.  . [  DISCONTINUED] metoprolol tartrate (LOPRESSOR) 50 MG tablet Take 1 tablet (50 mg total) by mouth 2 (two) times daily.  . [DISCONTINUED] potassium chloride SA (K-DUR,KLOR-CON) 20 MEQ tablet Take 2 tablets (40 mEq total) by mouth daily.     Allergies:   Patient has no known allergies.   Social History   Socioeconomic History  . Marital status: Widowed    Spouse name: Not on file  . Number of children: Not on file  . Years of education: Not on file  . Highest education level: Not on file  Occupational History  . Not on  file  Tobacco Use  . Smoking status: Never Smoker  . Smokeless tobacco: Never Used  Substance and Sexual Activity  . Alcohol use: Never  . Drug use: Never  . Sexual activity: Not on file  Other Topics Concern  . Not on file  Social History Narrative  . Not on file   Social Determinants of Health   Financial Resource Strain:   . Difficulty of Paying Living Expenses:   Food Insecurity:   . Worried About Charity fundraiser in the Last Year:   . Arboriculturist in the Last Year:   Transportation Needs:   . Film/video editor (Medical):   Marland Kitchen Lack of Transportation (Non-Medical):   Physical Activity:   . Days of Exercise per Week:   . Minutes of Exercise per Session:   Stress:   . Feeling of Stress :   Social Connections:   . Frequency of Communication with Friends and Family:   . Frequency of Social Gatherings with Friends and Family:   . Attends Religious Services:   . Active Member of Clubs or Organizations:   . Attends Archivist Meetings:   Marland Kitchen Marital Status:      Family History: The patient's family history is not on file.  ROS:   Please see the history of present illness.    All other systems reviewed and are negative.  EKGs/Labs/Other Studies Reviewed:    The following studies were reviewed today: Echo: IMPRESSIONS    1. Sigmoid septum noted. Left ventricular ejection fraction, by  estimation, is 55 to 60%. The left ventricle has normal function. The left  ventricle has no regional wall motion abnormalities. There is moderate  asymmetric left ventricular hypertrophy of  the basal-septal segment. Left ventricular diastolic function could not be  evaluated.  2. Right ventricular systolic function is low normal. The right  ventricular size is moderately enlarged. There is moderately elevated  pulmonary artery systolic pressure. The estimated right ventricular  systolic pressure is 27.5 mmHg.  3. Left atrial size was severely dilated.  4.  Right atrial size was severely dilated.  5. The mitral valve is grossly normal. Mild to moderate mitral valve  regurgitation.  6. The tricuspid valve is abnormal. Tricuspid valve regurgitation is  severe.  7. The LCC appears to have calcified leaflet which appears mass-like in  certain views. It was present on the study from 2019. The aortic valve is  tricuspid. Aortic valve regurgitation is not visualized. Mild aortic valve  sclerosis is present, with no  evidence of aortic valve stenosis.  8. The inferior vena cava is normal in size with greater than 50%  respiratory variability, suggesting right atrial pressure of 3 mmHg.   Comparison(s): Changes from prior study are noted. EF is now 55-60%. TR  remains severe. Similar appearance of calcified LCC of AoV.    Recent Labs: No  results found for requested labs within last 8760 hours.  Recent Lipid Panel    Component Value Date/Time   CHOL 126 08/28/2017 1511   CHOL 189 12/17/2015 0753   TRIG 51 08/28/2017 1511   TRIG 66 12/17/2015 0753   HDL 38 (L) 08/28/2017 1511   HDL 53 12/17/2015 0753   CHOLHDL 3.3 08/28/2017 1511   VLDL 10 08/28/2017 1511   LDLCALC 78 08/28/2017 1511   LDLCALC 123 (H) 12/17/2015 0753    Physical Exam:    VS:  BP 122/82   Pulse 66   Ht 5\' 4"  (1.626 m)   Wt 171 lb 6.4 oz (77.7 kg)   SpO2 97%   BMI 29.42 kg/m     Wt Readings from Last 3 Encounters:  09/25/19 171 lb 6.4 oz (77.7 kg)  09/02/17 205 lb (93 kg)  12/24/15 215 lb 4.8 oz (97.7 kg)     GEN:  Well nourished, well developed in no acute distress HEENT: Normal NECK: No JVD; No carotid bruits LYMPHATICS: No lymphadenopathy CARDIAC: RRR, 2/6 holosystolic murmur at the apex RESPIRATORY:  Clear to auscultation without rales, wheezing or rhonchi  ABDOMEN: Soft, non-tender, non-distended MUSCULOSKELETAL:  Trace bilateral pretibial edema, compression stockings in place; No deformity  SKIN: Warm and dry NEUROLOGIC:  Alert and oriented x  3 PSYCHIATRIC:  Normal affect   ASSESSMENT:    1. Mitral valve disorder   2. Severe tricuspid regurgitation    PLAN:    In order of problems listed above:  1. The patient and her daughter are counseled at length today.  I reviewed echo images with them.  Based on my review of her echocardiogram, she has normalization of her LV function on medical therapy.  I conveyed to them that this is a very good prognostic sign.  She does have marked biatrial enlargement with mild to moderate mitral regurgitation and severe tricuspid regurgitation.  I suspect the primary mechanism of her tricuspid regurgitation is related to annular dilatation.  The patient has New York Heart Association functional class II symptoms of chronic diastolic heart failure, but appears to be well compensated on her current medical regimen.  I discussed transcatheter valve therapies with them today.  I do not think her symptoms or severity of mitral regurgitation warrant intervention at this time.  She could potentially be a candidate for the Triluminate Study evaluating edge-to-edge repair of the tricuspid valve.  However, considering her well compensated clinical state, I would favor continuing medical therapy.  The patient and her daughter prefer this approach as well.  We will plan to see them back in 1 year with a repeat echocardiogram at that time.  As part of her evaluation today, I reviewed images of her current and previous echo studies, demonstrated these images to the patient and her daughter, discussed natural history of valvular heart disease, and contrasted treatment options of medical therapy versus interventional therapies.   Medication Adjustments/Labs and Tests Ordered: Current medicines are reviewed at length with the patient today.  Concerns regarding medicines are outlined above.  Orders Placed This Encounter  Procedures  . EKG 12-Lead   No orders of the defined types were placed in this encounter.   Patient  Instructions  Medication Instructions:  Your provider recommends that you continue on your current medications as directed. Please refer to the Current Medication list given to you today.    Follow-Up: You will be called to arrange your 1 year echo and office visit with Dr. Burt Knack.  Signed, Sherren Mocha, MD  09/26/2019 2:17 PM    [facility]

## 2019-09-25 NOTE — Patient Instructions (Signed)
Medication Instructions:  Your provider recommends that you continue on your current medications as directed. Please refer to the Current Medication list given to you today.    Follow-Up: You will be called to arrange your 1 year echo and office visit with Dr. Burt Knack.

## 2020-02-12 IMAGING — DX DG CHEST 2V
2 series · 2 of 2 positions shown · non-contrast
Comparison: None.

CLINICAL DATA: Pt reports central chest pain x2 days. Pt endorses
some sob and leg swelling. Hx of afib per pt. Hx of HTN. Nonsmoker.

EXAM:
CHEST - 2 VIEW

[chest ap]
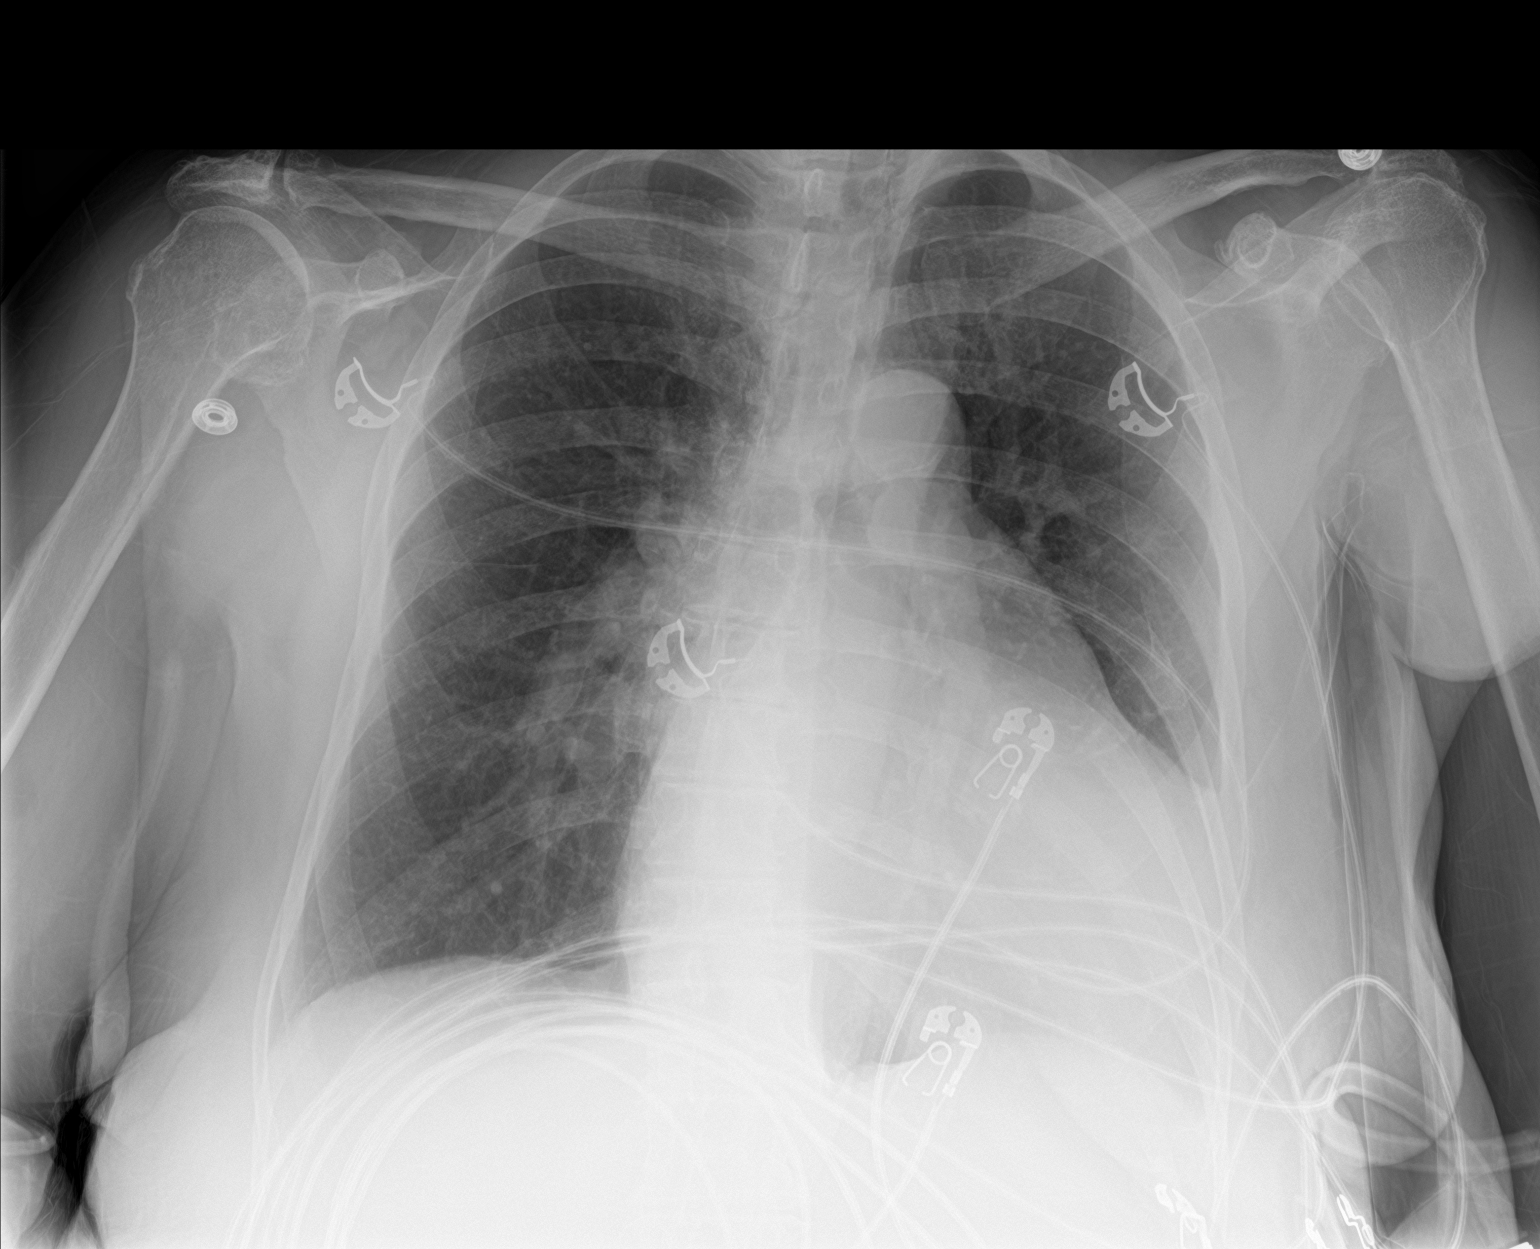

[chest lat]
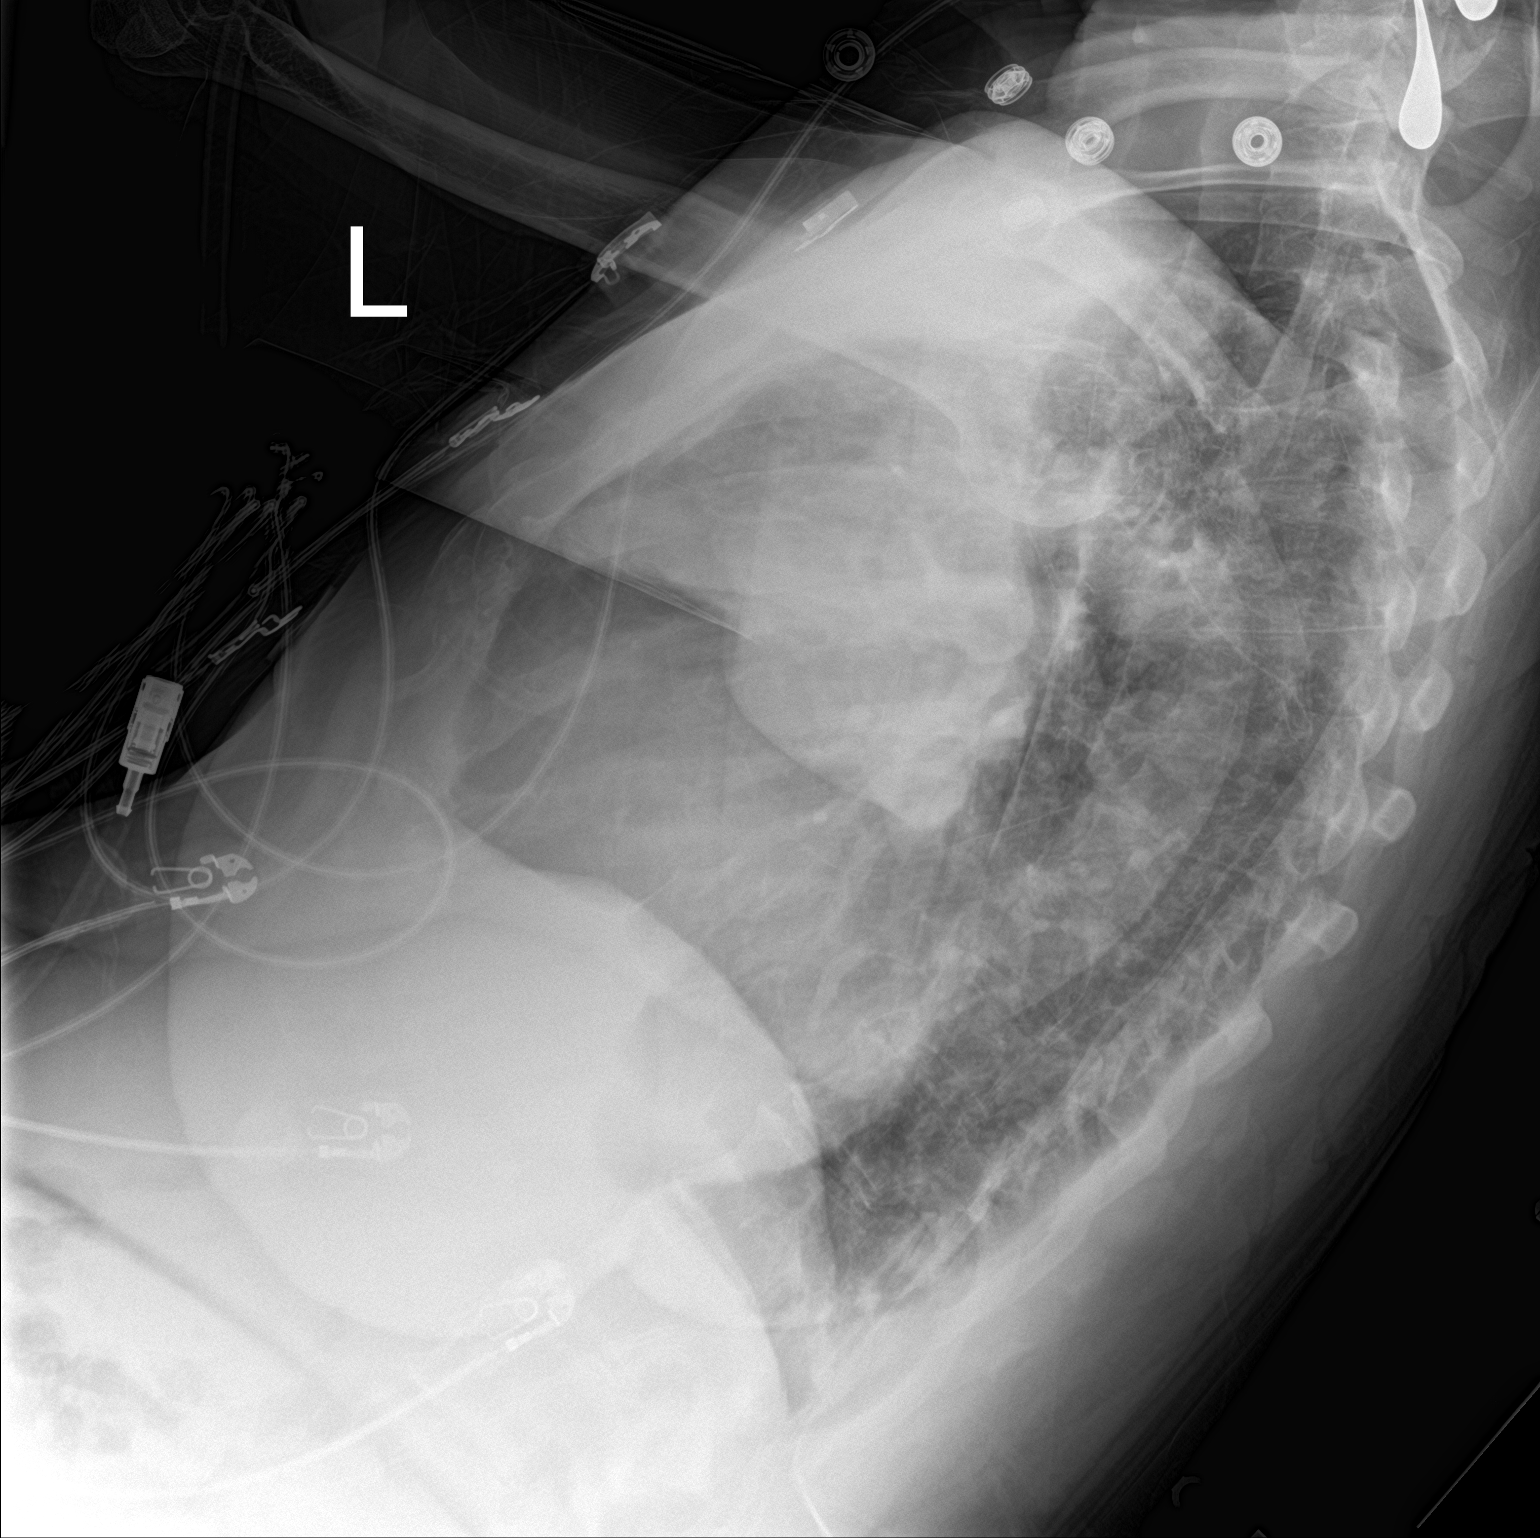

[2 of 2 positions shown; findings below may reference images not displayed]

FINDINGS: Enlarged cardiac silhouette. RIGHT hilar fullness. No effusion,
infiltrate or pneumothorax. No acute osseous abnormality.
IMPRESSION: 1. Cardiomegaly without acute cardiopulmonary findings.
2. RIGHT hilar prominence may represent adenopathy versus prominent
vascular structures. No comparison available. Recommend CT thorax
without contrast for further evaluation

## 2020-05-26 ENCOUNTER — Telehealth: Payer: Self-pay

## 2020-05-26 DIAGNOSIS — I059 Rheumatic mitral valve disease, unspecified: Secondary | ICD-10-CM

## 2020-05-26 DIAGNOSIS — I071 Rheumatic tricuspid insufficiency: Secondary | ICD-10-CM

## 2020-05-26 NOTE — Telephone Encounter (Signed)
   [name] Long returning [place] call

## 2020-05-26 NOTE — Telephone Encounter (Signed)
Attempted to call and arrange 1 year echo/same day visit with Dr. Burt Knack to continue discussion about MR (due 09/24/2020). The number on file for the patient is not a working number.  Left message for her daughter to call back to discuss.

## 2020-05-26 NOTE — Telephone Encounter (Signed)
Spoke with the patient's daughter, [name] ([place]). Scheduled her for 1 year echo and visit with Dr. Burt Knack 09/27/20. She was grateful for call and agrees with plan.

## 2020-09-26 NOTE — H&P (View-Only) (Signed)
Cardiology Office Note:    Date:  09/27/2020   ID:  [name] Long, DOB Aug 31, 1940, MRN [id number]  PCP:  Patient, No Pcp Per (Inactive)   [facility] Providers Cardiologist:  None     Referring MD: No ref. provider found   Chief Complaint  Patient presents with   Shortness of Breath    History of Present Illness:    [name] Long is a 80 y.o. female with a hx of mitral and tricuspid regurgitation, presenting for follow-up evaluation.  The patient lives in [place] and has been functionally independent.  Her daughter lives locally in [place].  The patient was seen 1 year ago in initial consultation with medical therapy recommended for treatment of her cardiac disease.  She has been felt to have nonischemic cardiomyopathy with marked biatrial enlargement and permanent atrial fibrillation.  She was initially diagnosed with heart failure in 2019.  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  The patient is here with her daughter today. She spends half of the year in [place] and half of the year (April-October) here in [place]. Over the past year, she reports some progression of her chronic dyspnea. She is dyspneic with one flight of stairs or with walking less than one block on level ground. She denies chest pain, palpitations, lightheadedness, or syncope. No orthopnea or PND. She does report nocturnal cough. She has chronic leg swelling unchanged over the past and well-managed with compression stockings.   Past Medical History:  Diagnosis Date   Atrial fibrillation ([place])    Cardiomyopathy ([place])    due to tachycardia induced    DM (diabetes mellitus) ([place])    Dyspnea    Hypertension    Lymphedema    MGUS (monoclonal gammopathy of unknown significance)    Mitral regurgitation    Pedal edema    PVC (premature ventricular contraction)     Past Surgical  History:  Procedure Laterality Date   CESAREAN SECTION      Current Medications: Current Meds  Medication Sig   apixaban (ELIQUIS) 5 MG TABS tablet Take 5 mg by mouth 2 (two) times daily.   brimonidine (ALPHAGAN) 0.2 % ophthalmic solution Place 1 drop into both eyes 2 (two) times daily.   carvedilol (COREG) 25 MG tablet Take 25 mg by mouth 2 (two) times daily with a meal.   furosemide (LASIX) 80 MG tablet Take 1 tablet (80 mg total) by mouth daily.   isosorbide-hydrALAZINE (BIDIL) 20-37.5 MG tablet Take 1 tablet by mouth 3 (three) times daily.   JARDIANCE 10 MG TABS tablet Take 10 mg by mouth daily.   latanoprost (XALATAN) 0.005 % ophthalmic solution Place 1 drop into both eyes at bedtime.   sacubitril-valsartan (ENTRESTO) 49-51 MG Take 1 tablet by mouth daily in the afternoon.     Allergies:   Patient has no known allergies.   Social History   Socioeconomic History   Marital status: Widowed    Spouse name: Not on file   Number of children: Not on file   Years of education: Not on file   Highest education level: Not on file  Occupational History   Not on file  Tobacco Use   Smoking status: Never   Smokeless tobacco: Never  Substance and Sexual Activity   Alcohol use: Never   Drug use: Never   Sexual activity: Not on file  Other Topics Concern  Not on file  Social History Narrative   Not on file   Social Determinants of Health   Financial Resource Strain: Not on file  Food Insecurity: Not on file  Transportation Needs: Not on file  Physical Activity: Not on file  Stress: Not on file  Social Connections: Not on file     Family History: The patient's family history is not on file.  ROS:   Please see the history of present illness.    All other systems reviewed and are negative.  EKGs/Labs/Other Studies Reviewed:    The following studies were reviewed today: An echocardiogram is reviewed from June 11, 2020.  This demonstrates a maximum TR velocity of 3.75  m/s with an RV systolic pressure of 65 mmHg.  The patient's LV function is noted to be normal with an ejection fraction of 55 to 60%.  The RV is moderately enlarged with mild RV dysfunction.  The left atrium is mildly dilated.  The right atrium is moderately dilated.  There is mild to moderate mitral regurgitation reported.  There is moderate to severe tricuspid regurgitation reported with severe pulmonary hypertension.  Today's echo:  1. Left ventricular ejection fraction, by estimation, is 55 to 60%. The  left ventricle has normal function. The left ventricle has no regional  wall motion abnormalities. There is mild left ventricular hypertrophy.  Left ventricular diastolic parameters  are indeterminate.   2. Right ventricular systolic function is mildly reduced. The right  ventricular size is moderately enlarged. There is severely elevated  pulmonary artery systolic pressure. The estimated right ventricular  systolic pressure is [id number] mmHg.   3. Left atrial size was severely dilated.   4. Right atrial size was severely dilated.   5. The mitral valve is grossly normal. Mild to moderate mitral valve  regurgitation. No evidence of mitral stenosis.   6. The tricuspid valve is abnormal. Tricuspid valve regurgitation is  severe.   7. Unchanged nodular calcification of the left coronary cusp at the point  of coaptation. The aortic valve is grossly normal. There is mild  calcification of the aortic valve. Aortic valve regurgitation is not  visualized. Mild aortic valve sclerosis is  present, with no evidence of aortic valve stenosis.   8. The inferior vena cava is dilated in size with >50% respiratory  variability, suggesting right atrial pressure of 8 mmHg.   Comparison(s): A prior study was performed on 09/24/19. No significant  change from prior study.   EKG:  EKG is ordered today.  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  Recent Labs: No results found for requested labs within last 8760 hours.  Recent Lipid Panel    Component Value Date/Time   CHOL 126 08/28/2017 1511   CHOL 189 12/17/2015 0753   TRIG 51 08/28/2017 1511   TRIG 66 12/17/2015 0753   HDL 38 (L) 08/28/2017 1511   HDL 53 12/17/2015 0753   CHOLHDL 3.3 08/28/2017 1511   VLDL 10 08/28/2017 1511   LDLCALC 78 08/28/2017 1511   LDLCALC 123 (H) 12/17/2015 0753     Risk Assessment/Calculations:    CHA2DS2-VASc Score = 5  This indicates a 7.2% annual risk of stroke. The patient's score is based upon: CHF History: Yes HTN History: Yes Diabetes History: No Stroke History: No Vascular Disease History: No Age Score: 2 Gender Score: 1  Physical Exam:    VS:  BP 130/70   Pulse 71   Ht '5\' 4"'$  (1.626 m)   Wt 167 lb 6.4 oz (75.9 kg)   SpO2 98%   BMI 28.73 kg/m     Wt Readings from Last 3 Encounters:  09/27/20 167 lb 6.4 oz (75.9 kg)  09/25/19 171 lb 6.4 oz (77.7 kg)  09/02/17 205 lb (93 kg)     GEN: Well nourished, well developed in no acute distress HEENT: Normal NECK: No JVD; No carotid bruits LYMPHATICS: No lymphadenopathy CARDIAC: Irregularly irregular, grade 2/6 systolic murmur at the left lower sternal border, I cannot appreciate a murmur at the apex or axilla. RESPIRATORY:  Clear to auscultation with inspiration with end expiratory wheezing bilaterally ABDOMEN: Soft, non-tender, non-distended MUSCULOSKELETAL: Compression wraps in place, mild edema noted; No deformity  SKIN: Warm and dry NEUROLOGIC:  Alert and oriented x 3 PSYCHIATRIC:  Normal affect   ASSESSMENT:    1. Mitral valve disorder   2. Severe tricuspid regurgitation   3. Permanent atrial fibrillation ([place])   4. SOB (shortness of breath)   5. Pulmonary hypertension, unspecified ([place])   6. Chronic diastolic heart failure ([facility])     PLAN:    In order of problems listed above:  Mild to moderate mitral regurgitation noted on previous echo studies, with 1 echo demonstrating moderately severe MR in the past.  Suspect functional mitral regurgitation in this patient with history of cardiomyopathy and permanent atrial fibrillation.  2D echocardiogram from this morning demonstrates stable, mild to moderate mitral regurgitation. Reviewed notes from the patient's cardiologist in [facility].  I am concerned that she has progressive pulmonary hypertension and now describes New York Heart Association functional class IIIb symptoms.  Today's echo demonstrates severe but stable tricuspid regurgitation.  There is now severe pulmonary hypertension present.  We will check a metabolic panel, CBC, and BNP.  Will consider right and left heart catheterization pending evaluation of her renal function.  I discussed all of this at length with the patient and her daughter today who understand.  I will be in touch with them once her echocardiogram and lab work is completed. The patient is anticoagulated with apixaban.  Her heart rate is well controlled.  She has longstanding atrial fibrillation. Suspect multifactorial but certainly her cardiac condition is contributing with New York Heart Association functional class IIIb symptoms of chronic diastolic heart failure related to valvular heart disease, permanent atrial fibrillation, and pulmonary hypertension. Repeat echocardiogram as above.  Likely proceed with at least right heart catheterization in the relatively near future.  Consider referral to advanced heart failure clinic for pulmonary hypertension evaluation. Past history of cardiomyopathy/systolic heart failure, normalized LV function on medical therapy on goal doses of BiDil, Entresto, carvedilol, and Jardiance.  Addendum: Labs are reviewed.  They are in an acceptable range for left and right heart catheterization with a creatinine of 1.39.  BNP is  elevated at 1248 as expected.  Recommend right and left heart catheterization.  Again, this was discussed at the patient's office visit in detail.  We were awaiting labs to make sure she is a suitable candidate for right and left heart catheterization.  We will contact her to schedule the procedure as long as she remains agreeable.  Medication Adjustments/Labs and Tests Ordered: Current medicines are reviewed at length with the patient today.  Concerns regarding medicines are outlined above.  Orders Placed This Encounter  Procedures   CBC   Basic metabolic panel  Hepatic function panel   Pro b natriuretic peptide   ECHOCARDIOGRAM COMPLETE    No orders of the defined types were placed in this encounter.   Patient Instructions  Medication Instructions:  Your physician recommends that you continue on your current medications as directed. Please refer to the Current Medication list given to you today.  *If you need a refill on your cardiac medications before your next appointment, please call your pharmacy*   Lab Work: BMET, Liver, CBC and Pro BNP today  If you have labs (blood work) drawn today and your tests are completely normal, you will receive your results only by: [place] (if you have MyChart) OR A paper copy in the mail If you have any lab test that is abnormal or we need to change your treatment, we will call you to review the results.   Testing/Procedures: Your physician has requested that you have an echocardiogram. Echocardiography is a painless test that uses sound waves to create images of your heart. It provides your doctor with information about the size and shape of your heart and how well your heart's chambers and valves are working. This procedure takes approximately one hour. There are no restrictions for this procedure.   Follow-Up: At [facility], you and your health needs are our priority.  As part of our continuing mission to provide you with  exceptional heart care, we have created designated Provider Care Teams.  These Care Teams include your primary Cardiologist (physician) and Advanced Practice Providers (APPs -  Physician Assistants and Nurse Practitioners) who all work together to provide you with the care you need, when you need it.  We recommend signing up for the patient portal called "MyChart".  Sign up information is provided on this After Visit Summary.  MyChart is used to connect with patients for Virtual Visits (Telemedicine).  Patients are able to view lab/test results, encounter notes, upcoming appointments, etc.  Non-urgent messages can be sent to your provider as well.   To learn more about what you can do with MyChart, go to [email or web address].    Your next appointment:   1 year(s)  The format for your next appointment:   In Person  Provider:   You may see Dr. Sherren Mocha or one of the following Advanced Practice Providers on your designated Care Team:   Richardson Dopp, PA-C Robbie Lis, [place]   Other Instructions     Signed, Sherren Mocha, MD  09/27/2020 9:08 AM    [place]

## 2020-09-26 NOTE — Progress Notes (Signed)
Cardiology Office Note:    Date:  09/27/2020   ID:  [name] Long, DOB [birth year], MRN [id number]  PCP:  Patient, No Pcp Per (Inactive)   [facility] Providers Cardiologist:  None     Referring MD: No ref. provider found   Chief Complaint  Patient presents with   Shortness of Breath    History of Present Illness:    [name] Long is a 80 y.o. female with a hx of mitral and tricuspid regurgitation, presenting for follow-up evaluation.  The patient lives in [place] and has been functionally independent.  Her daughter lives locally in [place].  The patient was seen 1 year ago in initial consultation with medical therapy recommended for treatment of her cardiac disease.  She has been felt to have nonischemic cardiomyopathy with marked biatrial enlargement and permanent atrial fibrillation.  She was initially diagnosed with heart failure in 2019.  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  The patient is here with her daughter today. She spends half of the year in [place] and half of the year (April-October) here in [place]. Over the past year, she reports some progression of her chronic dyspnea. She is dyspneic with one flight of stairs or with walking less than one block on level ground. She denies chest pain, palpitations, lightheadedness, or syncope. No orthopnea or PND. She does report nocturnal cough. She has chronic leg swelling unchanged over the past and well-managed with compression stockings.   Past Medical History:  Diagnosis Date   Atrial fibrillation ([place])    Cardiomyopathy ([place])    due to tachycardia induced    DM (diabetes mellitus) ([place])    Dyspnea    Hypertension    Lymphedema    MGUS (monoclonal gammopathy of unknown significance)    Mitral regurgitation    Pedal edema    PVC (premature ventricular contraction)     Past Surgical  History:  Procedure Laterality Date   CESAREAN SECTION      Current Medications: Current Meds  Medication Sig   apixaban (ELIQUIS) 5 MG TABS tablet Take 5 mg by mouth 2 (two) times daily.   brimonidine (ALPHAGAN) 0.2 % ophthalmic solution Place 1 drop into both eyes 2 (two) times daily.   carvedilol (COREG) 25 MG tablet Take 25 mg by mouth 2 (two) times daily with a meal.   furosemide (LASIX) 80 MG tablet Take 1 tablet (80 mg total) by mouth daily.   isosorbide-hydrALAZINE (BIDIL) 20-37.5 MG tablet Take 1 tablet by mouth 3 (three) times daily.   JARDIANCE 10 MG TABS tablet Take 10 mg by mouth daily.   latanoprost (XALATAN) 0.005 % ophthalmic solution Place 1 drop into both eyes at bedtime.   sacubitril-valsartan (ENTRESTO) 49-51 MG Take 1 tablet by mouth daily in the afternoon.     Allergies:   Patient has no known allergies.   Social History   Socioeconomic History   Marital status: Widowed    Spouse name: Not on file   Number of children: Not on file   Years of education: Not on file   Highest education level: Not on file  Occupational History   Not on file  Tobacco Use   Smoking status: Never   Smokeless tobacco: Never  Substance and Sexual Activity   Alcohol use: Never   Drug use: Never   Sexual activity: Not on file  Other Topics Concern  Not on file  Social History Narrative   Not on file   Social Determinants of Health   Financial Resource Strain: Not on file  Food Insecurity: Not on file  Transportation Needs: Not on file  Physical Activity: Not on file  Stress: Not on file  Social Connections: Not on file     Family History: The patient's family history is not on file.  ROS:   Please see the history of present illness.    All other systems reviewed and are negative.  EKGs/Labs/Other Studies Reviewed:    The following studies were reviewed today: An echocardiogram is reviewed from June 11, 2020.  This demonstrates a maximum TR velocity of 3.75  m/s with an RV systolic pressure of 65 mmHg.  The patient's LV function is noted to be normal with an ejection fraction of 55 to 60%.  The RV is moderately enlarged with mild RV dysfunction.  The left atrium is mildly dilated.  The right atrium is moderately dilated.  There is mild to moderate mitral regurgitation reported.  There is moderate to severe tricuspid regurgitation reported with severe pulmonary hypertension.  Today's echo:  1. Left ventricular ejection fraction, by estimation, is 55 to 60%. The  left ventricle has normal function. The left ventricle has no regional  wall motion abnormalities. There is mild left ventricular hypertrophy.  Left ventricular diastolic parameters  are indeterminate.   2. Right ventricular systolic function is mildly reduced. The right  ventricular size is moderately enlarged. There is severely elevated  pulmonary artery systolic pressure. The estimated right ventricular  systolic pressure is [id number] mmHg.   3. Left atrial size was severely dilated.   4. Right atrial size was severely dilated.   5. The mitral valve is grossly normal. Mild to moderate mitral valve  regurgitation. No evidence of mitral stenosis.   6. The tricuspid valve is abnormal. Tricuspid valve regurgitation is  severe.   7. Unchanged nodular calcification of the left coronary cusp at the point  of coaptation. The aortic valve is grossly normal. There is mild  calcification of the aortic valve. Aortic valve regurgitation is not  visualized. Mild aortic valve sclerosis is  present, with no evidence of aortic valve stenosis.   8. The inferior vena cava is dilated in size with >50% respiratory  variability, suggesting right atrial pressure of 8 mmHg.   Comparison(s): A prior study was performed on 09/24/19. No significant  change from prior study.   EKG:  EKG is ordered today.  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  Recent Labs: No results found for requested labs within last 8760 hours.  Recent Lipid Panel    Component Value Date/Time   CHOL 126 08/28/2017 1511   CHOL 189 12/17/2015 0753   TRIG 51 08/28/2017 1511   TRIG 66 12/17/2015 0753   HDL 38 (L) 08/28/2017 1511   HDL 53 12/17/2015 0753   CHOLHDL 3.3 08/28/2017 1511   VLDL 10 08/28/2017 1511   LDLCALC 78 08/28/2017 1511   LDLCALC 123 (H) 12/17/2015 0753     Risk Assessment/Calculations:    CHA2DS2-VASc Score = 5  This indicates a 7.2% annual risk of stroke. The patient's score is based upon: CHF History: Yes HTN History: Yes Diabetes History: No Stroke History: No Vascular Disease History: No Age Score: 2 Gender Score: 1  Physical Exam:    VS:  BP 130/70   Pulse 71   Ht '5\' 4"'$  (1.626 m)   Wt 167 lb 6.4 oz (75.9 kg)   SpO2 98%   BMI 28.73 kg/m     Wt Readings from Last 3 Encounters:  09/27/20 167 lb 6.4 oz (75.9 kg)  09/25/19 171 lb 6.4 oz (77.7 kg)  09/02/17 205 lb (93 kg)     GEN: Well nourished, well developed in no acute distress HEENT: Normal NECK: No JVD; No carotid bruits LYMPHATICS: No lymphadenopathy CARDIAC: Irregularly irregular, grade 2/6 systolic murmur at the left lower sternal border, I cannot appreciate a murmur at the apex or axilla. RESPIRATORY:  Clear to auscultation with inspiration with end expiratory wheezing bilaterally ABDOMEN: Soft, non-tender, non-distended MUSCULOSKELETAL: Compression wraps in place, mild edema noted; No deformity  SKIN: Warm and dry NEUROLOGIC:  Alert and oriented x 3 PSYCHIATRIC:  Normal affect   ASSESSMENT:    1. Mitral valve disorder   2. Severe tricuspid regurgitation   3. Permanent atrial fibrillation ([place])   4. SOB (shortness of breath)   5. Pulmonary hypertension, unspecified ([place])   6. Chronic diastolic heart failure (H[facility])     PLAN:    In order of problems listed above:  Mild to moderate mitral regurgitation noted on previous echo studies, with 1 echo demonstrating moderately severe MR in the past.  Suspect functional mitral regurgitation in this patient with history of cardiomyopathy and permanent atrial fibrillation.  2D echocardiogram from this morning demonstrates stable, mild to moderate mitral regurgitation. Reviewed notes from the patient's cardiologist in [facility].  I am concerned that she has progressive pulmonary hypertension and now describes New York Heart Association functional class IIIb symptoms.  Today's echo demonstrates severe but stable tricuspid regurgitation.  There is now severe pulmonary hypertension present.  We will check a metabolic panel, CBC, and BNP.  Will consider right and left heart catheterization pending evaluation of her renal function.  I discussed all of this at length with the patient and her daughter today who understand.  I will be in touch with them once her echocardiogram and lab work is completed. The patient is anticoagulated with apixaban.  Her heart rate is well controlled.  She has longstanding atrial fibrillation. Suspect multifactorial but certainly her cardiac condition is contributing with New York Heart Association functional class IIIb symptoms of chronic diastolic heart failure related to valvular heart disease, permanent atrial fibrillation, and pulmonary hypertension. Repeat echocardiogram as above.  Likely proceed with at least right heart catheterization in the relatively near future.  Consider referral to advanced heart failure clinic for pulmonary hypertension evaluation. Past history of cardiomyopathy/systolic heart failure, normalized LV function on medical therapy on goal doses of BiDil, Entresto, carvedilol, and Jardiance.  Addendum: Labs are reviewed.  They are in an acceptable range for left and right heart catheterization with a creatinine of 1.39.  BNP is  elevated at 1248 as expected.  Recommend right and left heart catheterization.  Again, this was discussed at the patient's office visit in detail.  We were awaiting labs to make sure she is a suitable candidate for right and left heart catheterization.  We will contact her to schedule the procedure as long as she remains agreeable.  Medication Adjustments/Labs and Tests Ordered: Current medicines are reviewed at length with the patient today.  Concerns regarding medicines are outlined above.  Orders Placed This Encounter  Procedures   CBC   Basic metabolic panel  Hepatic function panel   Pro b natriuretic peptide   ECHOCARDIOGRAM COMPLETE    No orders of the defined types were placed in this encounter.   Patient Instructions  Medication Instructions:  Your physician recommends that you continue on your current medications as directed. Please refer to the Current Medication list given to you today.  *If you need a refill on your cardiac medications before your next appointment, please call your pharmacy*   Lab Work: BMET, Liver, CBC and Pro BNP today  If you have labs (blood work) drawn today and your tests are completely normal, you will receive your results only by: [place] (if you have MyChart) OR A paper copy in the mail If you have any lab test that is abnormal or we need to change your treatment, we will call you to review the results.   Testing/Procedures: Your physician has requested that you have an echocardiogram. Echocardiography is a painless test that uses sound waves to create images of your heart. It provides your doctor with information about the size and shape of your heart and how well your heart's chambers and valves are working. This procedure takes approximately one hour. There are no restrictions for this procedure.   Follow-Up: At [facility], you and your health needs are our priority.  As part of our continuing mission to provide you with  exceptional heart care, we have created designated Provider Care Teams.  These Care Teams include your primary Cardiologist (physician) and Advanced Practice Providers (APPs -  Physician Assistants and Nurse Practitioners) who all work together to provide you with the care you need, when you need it.  We recommend signing up for the patient portal called "MyChart".  Sign up information is provided on this After Visit Summary.  MyChart is used to connect with patients for Virtual Visits (Telemedicine).  Patients are able to view lab/test results, encounter notes, upcoming appointments, etc.  Non-urgent messages can be sent to your provider as well.   To learn more about what you can do with MyChart, go to [email or web address].    Your next appointment:   1 year(s)  The format for your next appointment:   In Person  Provider:   You may see Dr. Sherren Mocha or one of the following Advanced Practice Providers on your designated Care Team:   Richardson Dopp, PA-C Robbie Lis, [place]   Other Instructions     Signed, Sherren Mocha, MD  09/27/2020 9:08 AM    [name] Long

## 2020-09-27 ENCOUNTER — Other Ambulatory Visit: Payer: Self-pay

## 2020-09-27 ENCOUNTER — Ambulatory Visit (INDEPENDENT_AMBULATORY_CARE_PROVIDER_SITE_OTHER): Payer: Medicare Other | Admitting: Cardiovascular Disease

## 2020-09-27 ENCOUNTER — Encounter: Payer: Self-pay | Admitting: Cardiovascular Disease

## 2020-09-27 ENCOUNTER — Ambulatory Visit (HOSPITAL_COMMUNITY): Payer: Medicare Other | Attending: Internal Medicine

## 2020-09-27 VITALS — BP 130/70 | HR 71 | Ht 64.0 in | Wt 167.4 lb

## 2020-09-27 DIAGNOSIS — I5032 Chronic diastolic (congestive) heart failure: Secondary | ICD-10-CM

## 2020-09-27 DIAGNOSIS — R0602 Shortness of breath: Secondary | ICD-10-CM

## 2020-09-27 DIAGNOSIS — I071 Rheumatic tricuspid insufficiency: Secondary | ICD-10-CM

## 2020-09-27 DIAGNOSIS — I059 Rheumatic mitral valve disease, unspecified: Secondary | ICD-10-CM

## 2020-09-27 DIAGNOSIS — I272 Pulmonary hypertension, unspecified: Secondary | ICD-10-CM

## 2020-09-27 DIAGNOSIS — I4821 Permanent atrial fibrillation: Secondary | ICD-10-CM | POA: Diagnosis not present

## 2020-09-27 LAB — ECHOCARDIOGRAM COMPLETE
Area-P 1/2: 3.21 cm2
MV M vel: 4.93 m/s
MV Peak grad: 97.2 mmHg
Radius: 0.3 cm
S' Lateral: 2.7 cm

## 2020-09-27 NOTE — Patient Instructions (Signed)
Medication Instructions:  Your physician recommends that you continue on your current medications as directed. Please refer to the Current Medication list given to you today.  *If you need a refill on your cardiac medications before your next appointment, please call your pharmacy*   Lab Work: BMET, Liver, CBC and Pro BNP today  If you have labs (blood work) drawn today and your tests are completely normal, you will receive your results only by: [place] (if you have MyChart) OR A paper copy in the mail If you have any lab test that is abnormal or we need to change your treatment, we will call you to review the results.   Testing/Procedures: Your physician has requested that you have an echocardiogram. Echocardiography is a painless test that uses sound waves to create images of your heart. It provides your doctor with information about the size and shape of your heart and how well your heart's chambers and valves are working. This procedure takes approximately one hour. There are no restrictions for this procedure.   Follow-Up: At [facility], you and your health needs are our priority.  As part of our continuing mission to provide you with exceptional heart care, we have created designated Provider Care Teams.  These Care Teams include your primary Cardiologist (physician) and Advanced Practice Providers (APPs -  Physician Assistants and Nurse Practitioners) who all work together to provide you with the care you need, when you need it.  We recommend signing up for the patient portal called "MyChart".  Sign up information is provided on this After Visit Summary.  MyChart is used to connect with patients for Virtual Visits (Telemedicine).  Patients are able to view lab/test results, encounter notes, upcoming appointments, etc.  Non-urgent messages can be sent to your provider as well.   To learn more about what you can do with MyChart, go to [email or web address].    Your next  appointment:   1 year(s)  The format for your next appointment:   In Person  Provider:   You may see Dr. Sherren Mocha or one of the following Advanced Practice Providers on your designated Care Team:   Richardson Dopp, PA-C Robbie Lis, [place]   Other Instructions

## 2020-09-28 ENCOUNTER — Telehealth: Payer: Self-pay

## 2020-09-28 LAB — HEPATIC FUNCTION PANEL
ALT: 11 IU/L (ref 0–32)
AST: 17 IU/L (ref 0–40)
Albumin: 4 g/dL (ref 3.7–4.7)
Alkaline Phosphatase: 67 IU/L (ref 44–121)
Bilirubin Total: 1 mg/dL (ref 0.0–1.2)
Bilirubin, Direct: 0.26 mg/dL (ref 0.00–0.40)
Total Protein: 8.5 g/dL (ref 6.0–8.5)

## 2020-09-28 LAB — CBC
Hematocrit: 47.4 % — ABNORMAL HIGH (ref 34.0–46.6)
Hemoglobin: 16 g/dL — ABNORMAL HIGH (ref 11.1–15.9)
MCH: 31.9 pg (ref 26.6–33.0)
MCHC: 33.8 g/dL (ref 31.5–35.7)
MCV: 95 fL (ref 79–97)
Platelets: 165 10*3/uL (ref 150–450)
RBC: 5.01 x10E6/uL (ref 3.77–5.28)
RDW: 13.7 % (ref 11.7–15.4)
WBC: 5.2 10*3/uL (ref 3.4–10.8)

## 2020-09-28 LAB — BASIC METABOLIC PANEL
BUN/Creatinine Ratio: 22 (ref 12–28)
BUN: 31 mg/dL — ABNORMAL HIGH (ref 8–27)
CO2: 23 mmol/L (ref 20–29)
Calcium: 9.5 mg/dL (ref 8.7–10.3)
Chloride: 93 mmol/L — ABNORMAL LOW (ref 96–106)
Creatinine, Ser: 1.39 mg/dL — ABNORMAL HIGH (ref 0.57–1.00)
Glucose: 131 mg/dL — ABNORMAL HIGH (ref 65–99)
Potassium: 4.1 mmol/L (ref 3.5–5.2)
Sodium: 134 mmol/L (ref 134–144)
eGFR: 39 mL/min/{1.73_m2} — ABNORMAL LOW (ref >59)

## 2020-09-28 LAB — PRO B NATRIURETIC PEPTIDE: NT-Pro BNP: 1248 pg/mL — ABNORMAL HIGH (ref 0–738)

## 2020-09-28 NOTE — Telephone Encounter (Signed)
The patient's daughter agrees to [facility] [place] [facility] 10/13/20 with Dr. Burt Knack. Scheduled cath and instructions sent via MyChart. She was grateful for call and agrees with plan.

## 2020-09-28 NOTE — Telephone Encounter (Signed)
Confirmed instructions with patient's daughter over the phone.  She was grateful for call and agrees with plan.

## 2020-10-11 ENCOUNTER — Telehealth: Payer: Self-pay | Admitting: *Deleted

## 2020-10-11 NOTE — Telephone Encounter (Signed)
Cardiac catheterization scheduled at [facility] for: Wednesday October 13, 2020 2 PM [facility] Main Entrance [facility]) at: 9 AM-pre-procedure hydration   No solid food after midnight prior to cath, clear liquids until 5 AM day of procedure.  Medication instructions: Hold: -Eliquis-none 10/11/20 until post procedure -Lasix-hold day before and day of procedure-GFR 39 -Entresto-hold day before and day of procedure-GFR 39 -Jardiance-AM of procedure   Except hold medications AM meds can be  taken pre-cath with sips of water including aspirin 81 mg.   Confirmed patient has responsible adult to drive home post procedure and be with patient first 24 hours after arriving home.  Patients are allowed one visitor in the waiting room during the time they are at the hospital for their procedure. Both patient and visitor must wear a mask once they enter the hospital.   Patient reports does not currently have any symptoms concerning for COVID-19 and no household members with COVID-19 like illness.      Reviewed procedure/mask/visitor instructions with patient's daughter (DPR), [name].

## 2020-10-13 ENCOUNTER — Ambulatory Visit (HOSPITAL_COMMUNITY)
Admission: RE | Admit: 2020-10-13 | Discharge: 2020-10-13 | Disposition: A | Discharge status: Home / Self Care | Payer: Medicare Other | Attending: Cardiovascular Disease | Admitting: Cardiovascular Disease

## 2020-10-13 ENCOUNTER — Encounter (HOSPITAL_COMMUNITY)
Admission: RE | Disposition: A | Discharge status: Home / Self Care | Payer: Self-pay | Source: Home / Self Care | Attending: Cardiovascular Disease

## 2020-10-13 ENCOUNTER — Other Ambulatory Visit: Payer: Self-pay | Admitting: *Deleted

## 2020-10-13 ENCOUNTER — Other Ambulatory Visit: Payer: Self-pay

## 2020-10-13 DIAGNOSIS — I059 Rheumatic mitral valve disease, unspecified: Secondary | ICD-10-CM

## 2020-10-13 DIAGNOSIS — Z79899 Other long term (current) drug therapy: Secondary | ICD-10-CM | POA: Insufficient documentation

## 2020-10-13 DIAGNOSIS — I5043 Acute on chronic combined systolic (congestive) and diastolic (congestive) heart failure: Secondary | ICD-10-CM | POA: Diagnosis not present

## 2020-10-13 DIAGNOSIS — I5042 Chronic combined systolic (congestive) and diastolic (congestive) heart failure: Secondary | ICD-10-CM | POA: Diagnosis not present

## 2020-10-13 DIAGNOSIS — Z7984 Long term (current) use of oral hypoglycemic drugs: Secondary | ICD-10-CM | POA: Insufficient documentation

## 2020-10-13 DIAGNOSIS — I071 Rheumatic tricuspid insufficiency: Secondary | ICD-10-CM

## 2020-10-13 DIAGNOSIS — I4821 Permanent atrial fibrillation: Secondary | ICD-10-CM | POA: Insufficient documentation

## 2020-10-13 DIAGNOSIS — I272 Pulmonary hypertension, unspecified: Secondary | ICD-10-CM | POA: Insufficient documentation

## 2020-10-13 DIAGNOSIS — I081 Rheumatic disorders of both mitral and tricuspid valves: Secondary | ICD-10-CM | POA: Insufficient documentation

## 2020-10-13 DIAGNOSIS — I110 Hypertensive heart disease with heart failure: Secondary | ICD-10-CM | POA: Insufficient documentation

## 2020-10-13 DIAGNOSIS — Z7901 Long term (current) use of anticoagulants: Secondary | ICD-10-CM | POA: Insufficient documentation

## 2020-10-13 DIAGNOSIS — R0602 Shortness of breath: Secondary | ICD-10-CM | POA: Insufficient documentation

## 2020-10-13 DIAGNOSIS — I429 Cardiomyopathy, unspecified: Secondary | ICD-10-CM | POA: Insufficient documentation

## 2020-10-13 HISTORY — PX: RIGHT/LEFT HEART CATH AND CORONARY ANGIOGRAPHY: CATH118266

## 2020-10-13 LAB — POCT I-STAT EG7
Acid-Base Excess: 3 mmol/L — ABNORMAL HIGH (ref 0.0–2.0)
Acid-Base Excess: 4 mmol/L — ABNORMAL HIGH (ref 0.0–2.0)
Bicarbonate: 30.9 mmol/L — ABNORMAL HIGH (ref 20.0–28.0)
Bicarbonate: 31.7 mmol/L — ABNORMAL HIGH (ref 20.0–28.0)
Calcium, Ion: 1.1 mmol/L — ABNORMAL LOW (ref 1.15–1.40)
Calcium, Ion: 1.2 mmol/L (ref 1.15–1.40)
HCT: 42 % (ref 36.0–46.0)
HCT: 43 % (ref 36.0–46.0)
Hemoglobin: 14.3 g/dL (ref 12.0–15.0)
Hemoglobin: 14.6 g/dL (ref 12.0–15.0)
O2 Saturation: 81 %
O2 Saturation: 85 %
Potassium: 3 mmol/L — ABNORMAL LOW (ref 3.5–5.1)
Potassium: 3.1 mmol/L — ABNORMAL LOW (ref 3.5–5.1)
Sodium: 145 mmol/L (ref 135–145)
Sodium: 146 mmol/L — ABNORMAL HIGH (ref 135–145)
TCO2: 33 mmol/L — ABNORMAL HIGH (ref 22–32)
TCO2: 33 mmol/L — ABNORMAL HIGH (ref 22–32)
pCO2, Ven: 57.9 mmHg (ref 44.0–60.0)
pCO2, Ven: 57.9 mmHg (ref 44.0–60.0)
pH, Ven: 7.336 (ref 7.250–7.430)
pH, Ven: 7.346 (ref 7.250–7.430)
pO2, Ven: 49 mmHg — ABNORMAL HIGH (ref 32.0–45.0)
pO2, Ven: 54 mmHg — ABNORMAL HIGH (ref 32.0–45.0)

## 2020-10-13 LAB — GLUCOSE, CAPILLARY
Glucose-Capillary: 105 mg/dL — ABNORMAL HIGH (ref 70–99)
Glucose-Capillary: 60 mg/dL — ABNORMAL LOW (ref 70–99)
Glucose-Capillary: 119 mg/dL — ABNORMAL HIGH (ref 70–99)

## 2020-10-13 LAB — POCT I-STAT 7, (LYTES, BLD GAS, ICA,H+H)
Acid-Base Excess: 4 mmol/L — ABNORMAL HIGH (ref 0.0–2.0)
Bicarbonate: 31 mmol/L — ABNORMAL HIGH (ref 20.0–28.0)
Calcium, Ion: 1.13 mmol/L — ABNORMAL LOW (ref 1.15–1.40)
HCT: 43 % (ref 36.0–46.0)
Hemoglobin: 14.6 g/dL (ref 12.0–15.0)
O2 Saturation: 100 %
Potassium: 3 mmol/L — ABNORMAL LOW (ref 3.5–5.1)
Sodium: 145 mmol/L (ref 135–145)
TCO2: 33 mmol/L — ABNORMAL HIGH (ref 22–32)
pCO2 arterial: 54.3 mmHg — ABNORMAL HIGH (ref 32.0–48.0)
pH, Arterial: 7.364 (ref 7.350–7.450)
pO2, Arterial: 205 mmHg — ABNORMAL HIGH (ref 83.0–108.0)

## 2020-10-13 SURGERY — RIGHT/LEFT HEART CATH AND CORONARY ANGIOGRAPHY
Anesthesia: LOCAL

## 2020-10-13 MED ORDER — LABETALOL HCL 5 MG/ML IV SOLN: 10.0000 mg | INTRAVENOUS | Status: DC | PRN

## 2020-10-13 MED ORDER — ONDANSETRON HCL 4 MG/2ML IJ SOLN: 4.0000 mg | Freq: Four times a day (QID) | INTRAMUSCULAR | Status: DC | PRN

## 2020-10-13 MED ORDER — ACETAMINOPHEN 325 MG PO TABS: 650.0000 mg | ORAL_TABLET | ORAL | Status: DC | PRN

## 2020-10-13 MED ORDER — ASPIRIN 81 MG PO CHEW: 81.0000 mg | CHEWABLE_TABLET | ORAL | Status: DC

## 2020-10-13 MED ORDER — SODIUM CHLORIDE 0.9% FLUSH: 3.0000 mL | Freq: Two times a day (BID) | INTRAVENOUS | Status: DC

## 2020-10-13 MED ORDER — SODIUM CHLORIDE 0.9 % WEIGHT BASED INFUSION: 1.0000 mL/kg/h | INTRAVENOUS | Status: DC

## 2020-10-13 MED ORDER — HEPARIN (PORCINE) IN NACL 1000-0.9 UT/500ML-% IV SOLN
INTRAVENOUS | Status: AC
  Filled 2020-10-13: qty 1000

## 2020-10-13 MED ORDER — VERAPAMIL HCL 2.5 MG/ML IV SOLN
INTRAVENOUS | Status: AC
  Filled 2020-10-13: qty 2

## 2020-10-13 MED ORDER — LIDOCAINE HCL (PF) 1 % IJ SOLN
INTRAMUSCULAR | Status: AC
  Filled 2020-10-13: qty 30

## 2020-10-13 MED ORDER — SODIUM CHLORIDE 0.9 % IV SOLN: 250.0000 mL | INTRAVENOUS | Status: DC | PRN

## 2020-10-13 MED ORDER — MIDAZOLAM HCL 2 MG/2ML IJ SOLN
INTRAMUSCULAR | Status: DC | PRN
  Administered 2020-10-13: 1 mg via INTRAVENOUS

## 2020-10-13 MED ORDER — SODIUM CHLORIDE 0.9 % WEIGHT BASED INFUSION
3.0000 mL/kg/h | INTRAVENOUS | Status: AC
  Administered 2020-10-13: 3 mL/kg/h via INTRAVENOUS

## 2020-10-13 MED ORDER — FENTANYL CITRATE (PF) 100 MCG/2ML IJ SOLN
INTRAMUSCULAR | Status: DC | PRN
  Administered 2020-10-13: 25 ug via INTRAVENOUS

## 2020-10-13 MED ORDER — LIDOCAINE HCL (PF) 1 % IJ SOLN
INTRAMUSCULAR | Status: DC | PRN
  Administered 2020-10-13 (×2): 2 mL

## 2020-10-13 MED ORDER — FENTANYL CITRATE (PF) 100 MCG/2ML IJ SOLN
INTRAMUSCULAR | Status: AC
  Filled 2020-10-13: qty 2

## 2020-10-13 MED ORDER — VERAPAMIL HCL 2.5 MG/ML IV SOLN
INTRAVENOUS | Status: DC | PRN
  Administered 2020-10-13: 10 mL via INTRA_ARTERIAL

## 2020-10-13 MED ORDER — SODIUM CHLORIDE 0.9% FLUSH: 3.0000 mL | INTRAVENOUS | Status: DC | PRN

## 2020-10-13 MED ORDER — HEPARIN (PORCINE) IN NACL 1000-0.9 UT/500ML-% IV SOLN
INTRAVENOUS | Status: DC | PRN
  Administered 2020-10-13 (×2): 500 mL

## 2020-10-13 MED ORDER — MIDAZOLAM HCL 2 MG/2ML IJ SOLN
INTRAMUSCULAR | Status: AC
  Filled 2020-10-13: qty 2

## 2020-10-13 MED ORDER — IOHEXOL 350 MG/ML SOLN
INTRAVENOUS | Status: DC | PRN
  Administered 2020-10-13: 30 mL

## 2020-10-13 MED ORDER — HEPARIN SODIUM (PORCINE) 1000 UNIT/ML IJ SOLN
INTRAMUSCULAR | Status: AC
  Filled 2020-10-13: qty 1

## 2020-10-13 MED ORDER — HEPARIN SODIUM (PORCINE) 1000 UNIT/ML IJ SOLN
INTRAMUSCULAR | Status: DC | PRN
  Administered 2020-10-13: 4000 [IU] via INTRAVENOUS

## 2020-10-13 MED ORDER — SODIUM CHLORIDE 0.9 % IV SOLN: INTRAVENOUS | Status: DC

## 2020-10-13 MED ORDER — HYDRALAZINE HCL 20 MG/ML IJ SOLN: 10.0000 mg | INTRAMUSCULAR | Status: DC | PRN

## 2020-10-13 SURGICAL SUPPLY — 12 items
CATH 5FR JL3.5 JR4 ANG PIG MP (CATHETERS) ×2 IMPLANT
CATH BALLN WEDGE 5F 110CM (CATHETERS) ×2 IMPLANT
CATH INFINITI 5FR JL4 (CATHETERS) ×2 IMPLANT
DEVICE RAD TR BAND REGULAR (VASCULAR PRODUCTS) ×2 IMPLANT
GLIDESHEATH SLEND SS 6F .021 (SHEATH) ×2 IMPLANT
GUIDEWIRE INQWIRE 1.5J.035X260 (WIRE) ×1 IMPLANT
INQWIRE 1.5J .035X260CM (WIRE) ×2
KIT HEART LEFT (KITS) ×2 IMPLANT
PACK CARDIAC CATHETERIZATION (CUSTOM PROCEDURE TRAY) ×2 IMPLANT
SHEATH GLIDE SLENDER 4/5FR (SHEATH) ×2 IMPLANT
TRANSDUCER W/STOPCOCK (MISCELLANEOUS) ×2 IMPLANT
TUBING CIL FLEX 10 FLL-RA (TUBING) ×2 IMPLANT

## 2020-10-13 NOTE — Interval H&P Note (Signed)
History and Physical Interval Note:  10/13/2020 1:57 PM  [name] Long  has presented today for surgery, with the diagnosis of mr.  The various methods of treatment have been discussed with the patient and family. After consideration of risks, benefits and other options for treatment, the patient has consented to  Procedure(s): RIGHT/LEFT HEART CATH AND CORONARY ANGIOGRAPHY (N/A) as a surgical intervention.  The patient's history has been reviewed, patient examined, no change in status, stable for surgery.  I have reviewed the patient's chart and labs.  Questions were answered to the patient's satisfaction.     Sherren Mocha

## 2020-10-13 NOTE — Discharge Instructions (Signed)
Radial Site Care  This sheet gives you information about how to care for yourself after your procedure. Your health care provider may also give you more specific instructions. If you have problems or questions, contact your health care provider. What can I expect after the procedure? After the procedure, it is common to have: Bruising and tenderness at the catheter insertion area. Follow these instructions at home: Medicines Take over-the-counter and prescription medicines only as told by your health care provider. Insertion site care Follow instructions from your health care provider about how to take care of your insertion site. Make sure you: Wash your hands with soap and water before you remove your bandage (dressing). If soap and water are not available, use hand sanitizer. May remove dressing in 24 hours. Check your insertion site every day for signs of infection. Check for: Redness, swelling, or pain. Fluid or blood. Pus or a bad smell. Warmth. Do no take baths, swim, or use a hot tub for 5 days. You may shower 24-48 hours after the procedure. Remove the dressing and gently wash the site with plain soap and water. Pat the area dry with a clean towel. Do not rub the site. That could cause bleeding. Do not apply powder or lotion to the site. Activity  For 24 hours after the procedure, or as directed by your health care provider: Do not flex or bend the affected arm. Do not push or pull heavy objects with the affected arm. Do not drive yourself home from the hospital or clinic. You may drive 24 hours after the procedure. Do not operate machinery or power tools. KEEP ARM ELEVATED THE REMAINDER OF THE DAY. Do not push, pull or lift anything that is heavier than 10 lb for 5 days. Ask your health care provider when it is okay to: Return to work or school. Resume usual physical activities or sports. Resume sexual activity. General instructions If the catheter site starts to  bleed, raise your arm and put firm pressure on the site. If the bleeding does not stop, get help right away. This is a medical emergency. DRINK PLENTY OF FLUIDS FOR THE NEXT 2-3 DAYS. No alcohol consumption for 24 hours after receiving sedation. If you went home on the same day as your procedure, a responsible adult should be with you for the first 24 hours after you arrive home. Keep all follow-up visits as told by your health care provider. This is important. Contact a health care provider if: You have a fever. You have redness, swelling, or yellow drainage around your insertion site. Get help right away if: You have unusual pain at the radial site. The catheter insertion area swells very fast. The insertion area is bleeding, and the bleeding does not stop when you hold steady pressure on the area. Your arm or hand becomes pale, cool, tingly, or numb. These symptoms may represent a serious problem that is an emergency. Do not wait to see if the symptoms will go away. Get medical help right away. Call your local emergency services (911 in the [place] Do not drive yourself to the hospital. Summary After the procedure, it is common to have bruising and tenderness at the site. Follow instructions from your health care provider about how to take care of your radial site wound. Check the wound every day for signs of infection.  This information is not intended to replace advice given to you by your health care provider. Make sure you discuss any questions you have with   your health care provider. Document Revised: 03/28/2017 Document Reviewed: 03/28/2017 Elsevier Patient Education  2020 [employer]vier Inc.  

## 2020-10-14 ENCOUNTER — Encounter (HOSPITAL_COMMUNITY): Payer: Self-pay | Admitting: Cardiovascular Disease

## 2021-07-29 ENCOUNTER — Encounter: Payer: Self-pay | Admitting: Cardiovascular Disease

## 2021-07-29 ENCOUNTER — Ambulatory Visit (INDEPENDENT_AMBULATORY_CARE_PROVIDER_SITE_OTHER): Payer: Medicare Other | Admitting: Cardiovascular Disease

## 2021-07-29 ENCOUNTER — Ambulatory Visit (HOSPITAL_COMMUNITY): Payer: Medicare Other | Attending: Cardiovascular Disease

## 2021-07-29 VITALS — BP 120/84 | HR 58 | Ht 62.0 in | Wt 160.0 lb

## 2021-07-29 DIAGNOSIS — I5032 Chronic diastolic (congestive) heart failure: Secondary | ICD-10-CM

## 2021-07-29 DIAGNOSIS — I071 Rheumatic tricuspid insufficiency: Secondary | ICD-10-CM | POA: Diagnosis not present

## 2021-07-29 DIAGNOSIS — I272 Pulmonary hypertension, unspecified: Secondary | ICD-10-CM

## 2021-07-29 DIAGNOSIS — I340 Nonrheumatic mitral (valve) insufficiency: Secondary | ICD-10-CM

## 2021-07-29 DIAGNOSIS — I361 Nonrheumatic tricuspid (valve) insufficiency: Secondary | ICD-10-CM

## 2021-07-29 DIAGNOSIS — I4821 Permanent atrial fibrillation: Secondary | ICD-10-CM

## 2021-07-29 DIAGNOSIS — I059 Rheumatic mitral valve disease, unspecified: Secondary | ICD-10-CM | POA: Diagnosis present

## 2021-07-29 LAB — COMPREHENSIVE METABOLIC PANEL
ALT: 10 IU/L (ref 0–32)
AST: 17 IU/L (ref 0–40)
Albumin/Globulin Ratio: 1 — ABNORMAL LOW (ref 1.2–2.2)
Albumin: 4.2 g/dL (ref 3.7–4.7)
Alkaline Phosphatase: 76 IU/L (ref 44–121)
BUN/Creatinine Ratio: 23 (ref 12–28)
BUN: 27 mg/dL (ref 8–27)
Bilirubin Total: 0.8 mg/dL (ref 0.0–1.2)
CO2: 28 mmol/L (ref 20–29)
Calcium: 9.3 mg/dL (ref 8.7–10.3)
Chloride: 95 mmol/L — ABNORMAL LOW (ref 96–106)
Creatinine, Ser: 1.15 mg/dL — ABNORMAL HIGH (ref 0.57–1.00)
Globulin, Total: 4.3 g/dL (ref 1.5–4.5)
Glucose: 112 mg/dL — ABNORMAL HIGH (ref 70–99)
Potassium: 4.4 mmol/L (ref 3.5–5.2)
Sodium: 139 mmol/L (ref 134–144)
Total Protein: 8.5 g/dL (ref 6.0–8.5)
eGFR: 48 mL/min/{1.73_m2} — ABNORMAL LOW (ref >59)

## 2021-07-29 LAB — ECHOCARDIOGRAM COMPLETE
Area-P 1/2: 3.31 cm2
Calc EF: 64.3 %
Height: 62 in
MV M vel: 4.49 m/s
MV Peak grad: 80.6 mmHg
Radius: 0.4 cm
S' Lateral: 3.5 cm
Single Plane A2C EF: 63 %
Single Plane A4C EF: 67.4 %
Weight: 2560 oz

## 2021-07-29 LAB — CBC
Hematocrit: 48 % — ABNORMAL HIGH (ref 34.0–46.6)
Hemoglobin: 16.2 g/dL — ABNORMAL HIGH (ref 11.1–15.9)
MCH: 31.2 pg (ref 26.6–33.0)
MCHC: 33.8 g/dL (ref 31.5–35.7)
MCV: 93 fL (ref 79–97)
Platelets: 187 10*3/uL (ref 150–450)
RBC: 5.19 x10E6/uL (ref 3.77–5.28)
RDW: 13.6 % (ref 11.7–15.4)
WBC: 5.2 10*3/uL (ref 3.4–10.8)

## 2021-07-29 NOTE — Patient Instructions (Addendum)
Medication Instructions:  Your physician recommends that you continue on your current medications as directed. Please refer to the Current Medication list given to you today.  *If you need a refill on your cardiac medications before your next appointment, please call your pharmacy*   Lab Work: CBC, CMET Today If you have labs (blood work) drawn today and your tests are completely normal, you will receive your results only by: [place] (if you have MyChart) OR A paper copy in the mail If you have any lab test that is abnormal or we need to change your treatment, we will call you to review the results.   Testing/Procedures: NONE (Echo today)   Follow-Up: At [facility], you and your health needs are our priority.  As part of our continuing mission to provide you with exceptional heart care, we have created designated Provider Care Teams.  These Care Teams include your primary Cardiologist (physician) and Advanced Practice Providers (APPs -  Physician Assistants and Nurse Practitioners) who all work together to provide you with the care you need, when you need it.  We recommend signing up for the patient portal called "MyChart".  Sign up information is provided on this After Visit Summary.  MyChart is used to connect with patients for Virtual Visits (Telemedicine).  Patients are able to view lab/test results, encounter notes, upcoming appointments, etc.  Non-urgent messages can be sent to your provider as well.   To learn more about what you can do with MyChart, go to [email or web address].    Your next appointment:   1 year(s)  The format for your next appointment:   In Person  Provider:   Sherren Mocha, MD   Important Information About Sugar

## 2021-07-29 NOTE — Progress Notes (Signed)
Cardiology Office Note:    Date:  07/29/2021   ID:  [name] Long, DOB [birth year], MRN [id number]  PCP:  Patient, No Pcp Per (Inactive)   [facility] Providers Cardiologist:  Sherren Mocha, MD     Referring MD: No ref. provider found   Chief Complaint  Patient presents with   Shortness of Breath    History of Present Illness:    [name] Long is a 81 y.o. female with a hx of mitral and tricuspid regurgitation, presenting for follow-up evaluation.  The patient lives in [place] and has been functionally independent.  Her daughter lives locally in [place].  The patient was seen 1 year ago in initial consultation with medical therapy recommended for treatment of her cardiac disease.  She has been felt to have nonischemic cardiomyopathy with marked biatrial enlargement and permanent atrial fibrillation.  She was initially diagnosed with heart failure in 2019.  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  The patient is here with her daughter today.  She reports no significant change in symptoms over the past year.  Her daughter feels like she is walking a little more slowly but otherwise no change in symptoms noted.  The patient has mild dyspnea with any moderate level activities such as walking upstairs.  She denies edema, orthopnea, or PND.  No chest pain or pressure.  No heart palpitations.  She continues to spend half the year in [place] and half the year locally here in [place].  Past Medical History:  Diagnosis Date   Atrial fibrillation ([place])    Cardiomyopathy ([place])    due to tachycardia induced    DM (diabetes mellitus) ([place])    Dyspnea    Hypertension    Lymphedema    MGUS (monoclonal gammopathy of unknown significance)    Mitral regurgitation    Pedal edema    PVC (premature ventricular contraction)     Past Surgical History:  Procedure  Laterality Date   CESAREAN SECTION     RIGHT/LEFT HEART CATH AND CORONARY ANGIOGRAPHY N/A 10/13/2020   Procedure: RIGHT/LEFT HEART CATH AND CORONARY ANGIOGRAPHY;  Surgeon: Sherren Mocha, MD;  Location: [facility];  Service: Cardiovascular;  Laterality: N/A;    Current Medications: Current Meds  Medication Sig   apixaban (ELIQUIS) 5 MG TABS tablet Take 5 mg by mouth 2 (two) times daily.   brimonidine (ALPHAGAN) 0.2 % ophthalmic solution Place 1 drop into both eyes 2 (two) times daily.   carvedilol (COREG) 25 MG tablet Take 25 mg by mouth 2 (two) times daily with a meal.   ENTRESTO 97-103 MG Take 1 tablet by mouth 2 (two) times daily.   furosemide (LASIX) 80 MG tablet Take 1 tablet (80 mg total) by mouth daily.   isosorbide-hydrALAZINE (BIDIL) 20-37.5 MG tablet Take 1 tablet by mouth 3 (three) times daily.   JARDIANCE 10 MG TABS tablet Take 10 mg by mouth in the morning.   latanoprost (XALATAN) 0.005 % ophthalmic solution Place 1 drop into both eyes at bedtime.     Allergies:   Patient has no known allergies.   Social History   Socioeconomic History   Marital status: Widowed    Spouse name: Not on file   Number of children: Not on file   Years of education: Not on file   Highest education level: Not on file  Occupational History   Not  on file  Tobacco Use   Smoking status: Never   Smokeless tobacco: Never  Substance and Sexual Activity   Alcohol use: Never   Drug use: Never   Sexual activity: Not on file  Other Topics Concern   Not on file  Social History Narrative   Not on file   Social Determinants of Health   Financial Resource Strain: Not on file  Food Insecurity: Not on file  Transportation Needs: Not on file  Physical Activity: Not on file  Stress: Not on file  Social Connections: Not on file     Family History: The patient's family history is not on file.  ROS:   Please see the history of present illness.     All other systems reviewed and are  negative.  EKGs/Labs/Other Studies Reviewed:    The following studies were reviewed today: Cardiac Cath 10-13-20: Diagnostic Dominance: Left Left Main  The vessel exhibits minimal luminal irregularities. Patent vessel with minimal irregularity, divides into the LAD and left circumflex. No stenosis.    Left Anterior Descending  There is mild diffuse disease throughout the vessel. Large vessel with mild diffuse irregularity, wraps the LV apex and supplies the distal inferoapical wall. No significant stenosis throughout the LAD or its diagonal branches.    Left Circumflex  The vessel exhibits minimal luminal irregularities. Large, dominant circumflex with no stenosis. Multiple OM branches are widely patent with no stenosis. Left PDA is patent.    Right Coronary Artery  Vessel is moderate in size. The vessel exhibits minimal luminal irregularities. Moderate-sized, nondominant RCA without significant stenosis    Intervention   No interventions have been documented.   Coronary Diagrams  Diagnostic Dominance: Left Intervention  Implants     No implant documentation for this case.   Syngo Images   Show images for CARDIAC CATHETERIZATION Images on Long Term Storage   Show images for [name], Long to Procedure Log  Procedure Log    Hemo Data  Flowsheet Row Most Recent Value  Fick Cardiac Output 7.37 L/min  Fick Cardiac Output Index 4.07 (L/min)/BSA  RA A Wave 7 mmHg  RA V Wave 9 mmHg  RA Mean 6 mmHg  RV Systolic Pressure 45 mmHg  RV Diastolic Pressure -4 mmHg  RV EDP 3 mmHg  PA Systolic Pressure 50 mmHg  PA Diastolic Pressure 11 mmHg  PA Mean 25 mmHg  PW A Wave 16 mmHg  PW V Wave 21 mmHg  PW Mean 13 mmHg  AO Systolic Pressure 536 mmHg  AO Diastolic Pressure 38 mmHg  AO Mean 68 mmHg  LV Systolic Pressure 144 mmHg  LV Diastolic Pressure 0 mmHg  LV EDP 10 mmHg  AOp Systolic Pressure 315 mmHg  AOp Diastolic Pressure 49 mmHg  AOp Mean Pressure 75 mmHg  LVp  Systolic Pressure 400 mmHg  LVp Diastolic Pressure 0 mmHg  LVp EDP Pressure 9 mmHg  QP/QS 0.79  TPVR Index 6.13 HRUI  TSVR Index 16.68 HRUI  PVR SVR Ratio 0.19  TPVR/TSVR Ratio 0.37   Echo 09/27/20: 1. Left ventricular ejection fraction, by estimation, is 55 to 60%. The  left ventricle has normal function. The left ventricle has no regional  wall motion abnormalities. There is mild left ventricular hypertrophy.  Left ventricular diastolic parameters  are indeterminate.   2. Right ventricular systolic function is mildly reduced. The right  ventricular size is moderately enlarged. There is severely elevated  pulmonary artery systolic pressure. The estimated right ventricular  systolic pressure  is 67.9 mmHg.   3. Left atrial size was severely dilated.   4. Right atrial size was severely dilated.   5. The mitral valve is grossly normal. Mild to moderate mitral valve  regurgitation. No evidence of mitral stenosis.   6. The tricuspid valve is abnormal. Tricuspid valve regurgitation is  severe.   7. Unchanged nodular calcification of the left coronary cusp at the point  of coaptation. The aortic valve is grossly normal. There is mild  calcification of the aortic valve. Aortic valve regurgitation is not  visualized. Mild aortic valve sclerosis is  present, with no evidence of aortic valve stenosis.   8. The inferior vena cava is dilated in size with >50% respiratory  variability, suggesting right atrial pressure of 8 mmHg.   Comparison(s): A prior study was performed on 09/24/19. No significant  change from prior study.   EKG:  EKG is not ordered today.    Recent Labs: 09/27/2020: ALT 11; BUN 31; Creatinine, Ser 1.39; NT-Pro BNP 1,248; Platelets 165 10/13/2020: Hemoglobin 14.3; Potassium 3.0; Sodium 146  Recent Lipid Panel    Component Value Date/Time   CHOL 126 08/28/2017 1511   CHOL 189 12/17/2015 0753   TRIG 51 08/28/2017 1511   TRIG 66 12/17/2015 0753   HDL 38 (L) 08/28/2017  1511   HDL 53 12/17/2015 0753   CHOLHDL 3.3 08/28/2017 1511   VLDL 10 08/28/2017 1511   LDLCALC 78 08/28/2017 1511   LDLCALC 123 (H) 12/17/2015 0753     Risk Assessment/Calculations:    CHA2DS2-VASc Score = 5   This indicates a 7.2% annual risk of stroke. The patient's score is based upon: CHF History: 1 HTN History: 1 Diabetes History: 0 Stroke History: 0 Vascular Disease History: 0 Age Score: 2 Gender Score: 1          Physical Exam:    VS:  BP 120/84   Pulse (!) 58   Ht '5\' 2"'$  (1.575 m)   Wt 160 lb (72.6 kg)   SpO2 95%   BMI 29.26 kg/m     Wt Readings from Last 3 Encounters:  07/29/21 160 lb (72.6 kg)  10/13/20 167 lb (75.8 kg)  09/27/20 167 lb 6.4 oz (75.9 kg)     GEN:  Well nourished, well developed in no acute distress HEENT: Normal NECK: No JVD; No carotid bruits LYMPHATICS: No lymphadenopathy CARDIAC: RRR, 2/6 systolic murmur at the LLSB RESPIRATORY:  Clear to auscultation without rales, wheezing or rhonchi  ABDOMEN: Soft, non-tender, non-distended MUSCULOSKELETAL:  No edema; No deformity  SKIN: Warm and dry NEUROLOGIC:  Alert and oriented x 3 PSYCHIATRIC:  Normal affect   ASSESSMENT:    1. Severe tricuspid regurgitation   2. Permanent atrial fibrillation ([place])   3. Chronic diastolic heart failure ([place])   4. Pulmonary hypertension, unspecified ([place])   5. Nonrheumatic mitral valve insufficiency    PLAN:    In order of problems listed above:  The patient has moderate to severe tricuspid regurgitation.  She appears well compensated on her current medical regimen.  I reviewed today's echo which shows no significant change from previous. Heart rate is well controlled.  Patient is anticoagulated with apixaban. Medical regimen includes carvedilol, Jardiance, furosemide, BiDil, and Entresto.  No changes are recommended as she is on an excellent medical program and appears clinically well compensated.  She has New York Heart Association functional  class II symptoms. Primarily related to left heart disease based on cardiac cath findings from last year  when she had a PVR less than 2 [place] units. Moderate mitral regurgitation noted.  Suspect secondary MR from atrial dilatation.  Continue clinical and echo surveillance annually.           Medication Adjustments/Labs and Tests Ordered: Current medicines are reviewed at length with the patient today.  Concerns regarding medicines are outlined above.  Orders Placed This Encounter  Procedures   CBC   Comprehensive metabolic panel   No orders of the defined types were placed in this encounter.   Patient Instructions  Medication Instructions:  Your physician recommends that you continue on your current medications as directed. Please refer to the Current Medication list given to you today.  *If you need a refill on your cardiac medications before your next appointment, please call your pharmacy*   Lab Work: CBC, CMET Today If you have labs (blood work) drawn today and your tests are completely normal, you will receive your results only by: [facility] (if you have MyChart) OR A paper copy in the mail If you have any lab test that is abnormal or we need to change your treatment, we will call you to review the results.   Testing/Procedures: NONE (Echo today)   Follow-Up: At [facility], you and your health needs are our priority.  As part of our continuing mission to provide you with exceptional heart care, we have created designated Provider Care Teams.  These Care Teams include your primary Cardiologist (physician) and Advanced Practice Providers (APPs -  Physician Assistants and Nurse Practitioners) who all work together to provide you with the care you need, when you need it.  We recommend signing up for the patient portal called "MyChart".  Sign up information is provided on this After Visit Summary.  MyChart is used to connect with patients for Virtual Visits  (Telemedicine).  Patients are able to view lab/test results, encounter notes, upcoming appointments, etc.  Non-urgent messages can be sent to your provider as well.   To learn more about what you can do with MyChart, go to [email or web address].    Your next appointment:   1 year(s)  The format for your next appointment:   In Person  Provider:   Sherren Mocha, MD   Important Information About Sugar        Signed, Sherren Mocha, MD  07/29/2021 2:10 PM    [place]

## 2021-08-10 ENCOUNTER — Telehealth (HOSPITAL_COMMUNITY): Payer: Self-pay | Admitting: Emergency Medicine

## 2021-08-10 ENCOUNTER — Encounter (HOSPITAL_COMMUNITY): Payer: Self-pay

## 2021-08-10 ENCOUNTER — Ambulatory Visit (HOSPITAL_COMMUNITY)
Admission: EM | Admit: 2021-08-10 | Discharge: 2021-08-10 | Disposition: A | Discharge status: Home / Self Care | Payer: Medicare Other | Attending: Internal Medicine | Admitting: Internal Medicine

## 2021-08-10 ENCOUNTER — Ambulatory Visit (INDEPENDENT_AMBULATORY_CARE_PROVIDER_SITE_OTHER): Payer: Medicare Other

## 2021-08-10 DIAGNOSIS — N183 Chronic kidney disease, stage 3 unspecified: Secondary | ICD-10-CM | POA: Insufficient documentation

## 2021-08-10 DIAGNOSIS — Z20822 Contact with and (suspected) exposure to covid-19: Secondary | ICD-10-CM | POA: Diagnosis not present

## 2021-08-10 DIAGNOSIS — I130 Hypertensive heart and chronic kidney disease with heart failure and stage 1 through stage 4 chronic kidney disease, or unspecified chronic kidney disease: Secondary | ICD-10-CM | POA: Diagnosis not present

## 2021-08-10 DIAGNOSIS — E1122 Type 2 diabetes mellitus with diabetic chronic kidney disease: Secondary | ICD-10-CM | POA: Diagnosis not present

## 2021-08-10 DIAGNOSIS — R059 Cough, unspecified: Secondary | ICD-10-CM

## 2021-08-10 DIAGNOSIS — J069 Acute upper respiratory infection, unspecified: Secondary | ICD-10-CM | POA: Diagnosis present

## 2021-08-10 DIAGNOSIS — I071 Rheumatic tricuspid insufficiency: Secondary | ICD-10-CM | POA: Insufficient documentation

## 2021-08-10 DIAGNOSIS — I5043 Acute on chronic combined systolic (congestive) and diastolic (congestive) heart failure: Secondary | ICD-10-CM | POA: Diagnosis not present

## 2021-08-10 DIAGNOSIS — Z7901 Long term (current) use of anticoagulants: Secondary | ICD-10-CM | POA: Diagnosis not present

## 2021-08-10 DIAGNOSIS — I4819 Other persistent atrial fibrillation: Secondary | ICD-10-CM | POA: Diagnosis not present

## 2021-08-10 DIAGNOSIS — Z7984 Long term (current) use of oral hypoglycemic drugs: Secondary | ICD-10-CM | POA: Insufficient documentation

## 2021-08-10 MED ORDER — BENZONATATE 100 MG PO CAPS: 100.0000 mg | ORAL_CAPSULE | Freq: Three times a day (TID) | ORAL | 0 refills | Status: DC | PRN

## 2021-08-10 NOTE — ED Triage Notes (Signed)
Pt c/o cough since Friday, now having runny nose and chest congestion. Taking cough syrup with little relief.

## 2021-08-10 NOTE — Discharge Instructions (Signed)
We have tested you for COVID and the results will likely come back tomorrow.  Your chest x-ray did not show any extra fluid or pneumonia.  I have sent a prescription to the pharmacy for cough medicine.  If you have difficulty breathing, chest pain, you are vomiting and can't keep any liquids down and you aren't urinating at least 50% of your normal amount, you should be seen at the emergency room right away.  If you aren't improving over the next week, please follow up with your regular medical provider.  For your congestion, you can use nasal saline spray.  You can also use a humidifier.  You can also use honey as needed for cough by the spoonful or in a warm liquid (do not give honey to an infant less than a year old).   Monitor your blood pressure over the next few days and make sure you take your medications.  If it remains consistently elevated, cardio your cardiologist.  If you are having worsening of symptoms including chest pain, difficulty breathing, numbness and tingling on one side of your body or the other, weakness on one side of your body or the other, changes in your vision, you should be seen at the emergency room right away.

## 2021-08-10 NOTE — ED Provider Notes (Signed)
[facility]    CSN: [id number] Arrival date & time: 08/10/21  8119      History   Chief Complaint Chief Complaint  Patient presents with   Cough    HPI [name] Long is a 81 y.o. female.   Cough Started 5 days ago No fevers  Endorses congestion, rhinorrhea Has been coughing up clear mucus Tried OTC cough syrup without improvement Denies sore throat, headache, fatigue, myalgias, nausea, vomiting, diarrhea, chest pain, shortness of breath Has been drinking normally, has normal UOP  She saw her grandchild recently who had runny nose as well  History of severe tricuspid regurg with A-fib and CHF Reports compliance with Lasix Does not do daily weights Has not noticed any new leg swelling, chest pain, orthopnea, dyspnea on exertion  Reports taking one BP med this morning before arrival Other than above, has been in normal state of health   Past Medical History:  Diagnosis Date   Atrial fibrillation ([place])    Cardiomyopathy ([place])    due to tachycardia induced    DM (diabetes mellitus) ([place])    Dyspnea    Hypertension    Lymphedema    MGUS (monoclonal gammopathy of unknown significance)    Mitral regurgitation    Pedal edema    PVC (premature ventricular contraction)     Patient Active Problem List   Diagnosis Date Noted   AKI (acute kidney injury) ([place])    Acute CHF (congestive heart failure) ([place]) 08/28/2017   Hypertensive emergency 08/28/2017   Obesity, Class III, BMI 40-49.9 (morbid obesity) ([place]) 08/28/2017   Hypokalemia 08/28/2017   Acute on chronic combined systolic and diastolic CHF (congestive heart failure) (H[facility])    Persistent atrial fibrillation ([facility])    Essential hypertension    Stage 3 chronic kidney disease ([place])    Hypercholesterolemia 12/17/2015   Routine health maintenance 07/23/2015   MGUS (monoclonal gammopathy of unknown significance) 07/31/2014    Past Surgical History:  Procedure Laterality Date   CESAREAN SECTION      RIGHT/LEFT HEART CATH AND CORONARY ANGIOGRAPHY N/A 10/13/2020   Procedure: RIGHT/LEFT HEART CATH AND CORONARY ANGIOGRAPHY;  Surgeon: Sherren Mocha, MD;  Location: [facility];  Service: Cardiovascular;  Laterality: N/A;    OB History   No obstetric history on file.      Home Medications    Prior to Admission medications   Medication Sig Start Date End Date Taking? Authorizing Provider  benzonatate (TESSALON) 100 MG capsule Take 1 capsule (100 mg total) by mouth 3 (three) times daily as needed for cough. 08/10/21  Yes Vilda Zollner, Bernita Raisin, DO  apixaban (ELIQUIS) 5 MG TABS tablet Take 5 mg by mouth 2 (two) times daily.    [provider]  aspirin EC 81 MG tablet Take 81 mg by mouth once. Swallow whole. Take pre cath Patient not taking: Reported on 07/29/2021    [provider]  brimonidine (ALPHAGAN) 0.2 % ophthalmic solution Place 1 drop into both eyes 2 (two) times daily.    [provider]  carvedilol (COREG) 25 MG tablet Take 25 mg by mouth 2 (two) times daily with a meal.    [provider]  ENTRESTO 97-103 MG Take 1 tablet by mouth 2 (two) times daily. 05/03/20   [provider]  furosemide (LASIX) 80 MG tablet Take 1 tablet (80 mg total) by mouth daily. 09/02/17   Patrecia Pour, MD  isosorbide-hydrALAZINE (BIDIL) 20-37.5 MG tablet Take 1 tablet by mouth 3 (  three) times daily. 09/02/17   Patrecia Pour, MD  JARDIANCE 10 MG TABS tablet Take 10 mg by mouth in the morning. 07/20/20   [provider]  latanoprost (XALATAN) 0.005 % ophthalmic solution Place 1 drop into both eyes at bedtime.    [provider]    Family History History reviewed. No pertinent family history.  Social History Social History   Tobacco Use   Smoking status: Never   Smokeless tobacco: Never  Substance Use Topics   Alcohol use: Never   Drug use: Never     Allergies   Patient has no known allergies.   Review of Systems Review of  Systems  All other systems reviewed and are negative. Per HPI  Physical Exam Triage Vital Signs ED Triage Vitals  Enc Vitals Group     BP 08/10/21 0830 (!) 181/82     Pulse Rate 08/10/21 0830 72     Resp 08/10/21 0830 18     Temp 08/10/21 0830 98.6 F (37 C)     Temp Source 08/10/21 0830 Oral     SpO2 08/10/21 0830 94 %     Weight --      Height --      Head Circumference --      Peak Flow --      Pain Score 08/10/21 0831 0     Pain Loc --      Pain Edu? --      Excl. in [place]? --    No data found.  Updated Vital Signs BP (!) 181/82 (BP Location: Right Arm)   Pulse 72   Temp 98.6 F (37 C) (Oral)   Resp 18   SpO2 94%   Visual Acuity Right Eye Distance:   Left Eye Distance:   Bilateral Distance:    Right Eye Near:   Left Eye Near:    Bilateral Near:     Physical Exam Constitutional:      General: She is not in acute distress.    Appearance: She is well-developed. She is not ill-appearing or toxic-appearing.  HENT:     Head: Normocephalic and atraumatic.     Right Ear: Tympanic membrane, ear canal and external ear normal.     Left Ear: Tympanic membrane, ear canal and external ear normal.     Nose: Nose normal. No congestion or rhinorrhea.     Mouth/Throat:     Mouth: Mucous membranes are moist.     Pharynx: No oropharyngeal exudate or posterior oropharyngeal erythema.  Eyes:     Conjunctiva/sclera: Conjunctivae normal.  Cardiovascular:     Rate and Rhythm: Normal rate.     Heart sounds: Murmur heard.  Pulmonary:     Effort: Pulmonary effort is normal. No respiratory distress.     Breath sounds: Rhonchi (left lung base) present. No wheezing or rales.  Musculoskeletal:     Cervical back: Neck supple. No rigidity or tenderness.     Right lower leg: No edema.     Left lower leg: No edema.  Lymphadenopathy:     Cervical: No cervical adenopathy.  Skin:    General: Skin is warm and dry.     Capillary Refill: Capillary refill takes less than 2 seconds.   Neurological:     Mental Status: She is alert and oriented to person, place, and time.     UC Treatments / Results  Labs (all labs ordered are listed, but only abnormal results are displayed) Labs Reviewed  SARS  CORONAVIRUS 2 (TAT 6-24 HRS)    EKG   Radiology DG Chest 2 View  Result Date: 08/10/2021 CLINICAL DATA:  cough, focal rhonchi EXAM: CHEST - 2 VIEW COMPARISON:  August 28, 2017 FINDINGS: Moderately large cardiomegaly. Atheromatous calcifications of the arch of the aorta. Lungs are clear without focal consolidation, pleural effusion or significant vascular congestion. Moderate arthrosis of the visualized shoulder joints. IMPRESSION: Cardiomegaly.  No acute pleural pulmonary process seen in the lungs. Electronically Signed   By: Frazier Richards M.D.   On: 08/10/2021 09:10    Procedures Procedures (including critical care time)  Medications Ordered in UC Medications - No data to display  Initial Impression / Assessment and Plan / UC Course  I have reviewed the triage vital signs and the nursing notes.  Pertinent labs & imaging results that were available during my care of the patient were reviewed by me and considered in my medical decision making (see chart for details).     Chest x-ray with cardiomegaly but no acute abnormalities.  Discussed monitoring of blood pressure and given ED precautions for this.  Advised that if her blood pressure remains elevated at home, to call her cardiologist.  COVID test performed, Tessalon Perles sent.  Also recommended over-the-counter treatments.  See AVS for return precautions.  Recommended following up over the next few days if not making improvement.   Final Clinical Impressions(s) / UC Diagnoses   Final diagnoses:  Viral URI with cough     Discharge Instructions      We have tested you for COVID and the results will likely come back tomorrow.  Your chest x-ray did not show any extra fluid or pneumonia.  I have sent a prescription  to the pharmacy for cough medicine.  If you have difficulty breathing, chest pain, you are vomiting and can't keep any liquids down and you aren't urinating at least 50% of your normal amount, you should be seen at the emergency room right away.  If you aren't improving over the next week, please follow up with your regular medical provider.  For your congestion, you can use nasal saline spray.  You can also use a humidifier.  You can also use honey as needed for cough by the spoonful or in a warm liquid (do not give honey to an infant less than a year old).   Monitor your blood pressure over the next few days and make sure you take your medications.  If it remains consistently elevated, cardio your cardiologist.  If you are having worsening of symptoms including chest pain, difficulty breathing, numbness and tingling on one side of your body or the other, weakness on one side of your body or the other, changes in your vision, you should be seen at the emergency room right away.     ED Prescriptions     Medication Sig Dispense Auth. Provider   benzonatate (TESSALON) 100 MG capsule Take 1 capsule (100 mg total) by mouth 3 (three) times daily as needed for cough. 21 capsule Camyra Vaeth, Bernita Raisin, DO      PDMP not reviewed this encounter.   [place], Bernita Raisin, DO 08/10/21 [phone]

## 2021-08-11 LAB — SARS CORONAVIRUS 2 (TAT 6-24 HRS): SARS Coronavirus 2: NEGATIVE

## 2022-08-14 ENCOUNTER — Ambulatory Visit (HOSPITAL_COMMUNITY)
Admission: EM | Admit: 2022-08-14 | Discharge: 2022-08-14 | Disposition: A | Discharge status: Home / Self Care | Payer: Medicare Other | Attending: Family Medicine | Admitting: Family Medicine

## 2022-08-14 ENCOUNTER — Encounter (HOSPITAL_COMMUNITY): Payer: Self-pay | Admitting: *Deleted

## 2022-08-14 ENCOUNTER — Emergency Department (HOSPITAL_COMMUNITY)
Admission: EM | Admit: 2022-08-14 | Discharge: 2022-08-14 | Disposition: A | Discharge status: Home / Self Care | Payer: Medicare Other | Attending: Emergency Medicine | Admitting: Emergency Medicine

## 2022-08-14 ENCOUNTER — Emergency Department (HOSPITAL_BASED_OUTPATIENT_CLINIC_OR_DEPARTMENT_OTHER): Payer: Medicare Other

## 2022-08-14 ENCOUNTER — Emergency Department (HOSPITAL_COMMUNITY): Payer: Medicare Other

## 2022-08-14 ENCOUNTER — Other Ambulatory Visit: Payer: Self-pay

## 2022-08-14 DIAGNOSIS — I4891 Unspecified atrial fibrillation: Secondary | ICD-10-CM | POA: Diagnosis not present

## 2022-08-14 DIAGNOSIS — S81802D Unspecified open wound, left lower leg, subsequent encounter: Secondary | ICD-10-CM | POA: Diagnosis not present

## 2022-08-14 DIAGNOSIS — Z7901 Long term (current) use of anticoagulants: Secondary | ICD-10-CM | POA: Insufficient documentation

## 2022-08-14 DIAGNOSIS — S8012XA Contusion of left lower leg, initial encounter: Secondary | ICD-10-CM | POA: Insufficient documentation

## 2022-08-14 DIAGNOSIS — I80292 Phlebitis and thrombophlebitis of other deep vessels of left lower extremity: Secondary | ICD-10-CM | POA: Diagnosis not present

## 2022-08-14 DIAGNOSIS — Z7982 Long term (current) use of aspirin: Secondary | ICD-10-CM | POA: Diagnosis not present

## 2022-08-14 DIAGNOSIS — M7989 Other specified soft tissue disorders: Secondary | ICD-10-CM | POA: Diagnosis not present

## 2022-08-14 DIAGNOSIS — L03116 Cellulitis of left lower limb: Secondary | ICD-10-CM

## 2022-08-14 DIAGNOSIS — W228XXA Striking against or struck by other objects, initial encounter: Secondary | ICD-10-CM | POA: Insufficient documentation

## 2022-08-14 DIAGNOSIS — S8992XA Unspecified injury of left lower leg, initial encounter: Secondary | ICD-10-CM | POA: Diagnosis present

## 2022-08-14 LAB — CBC WITH DIFFERENTIAL/PLATELET
Abs Immature Granulocytes: 0.02 10*3/uL (ref 0.00–0.07)
Basophils Absolute: 0 10*3/uL (ref 0.0–0.1)
Basophils Relative: 0 %
Eosinophils Absolute: 0 10*3/uL (ref 0.0–0.5)
Eosinophils Relative: 0 %
HCT: 32.5 % — ABNORMAL LOW (ref 36.0–46.0)
Hemoglobin: 10.4 g/dL — ABNORMAL LOW (ref 12.0–15.0)
Immature Granulocytes: 0 %
Lymphocytes Relative: 11 %
Lymphs Abs: 0.8 10*3/uL (ref 0.7–4.0)
MCH: 31 pg (ref 26.0–34.0)
MCHC: 32 g/dL (ref 30.0–36.0)
MCV: 97 fL (ref 80.0–100.0)
Monocytes Absolute: 0.7 10*3/uL (ref 0.1–1.0)
Monocytes Relative: 10 %
Neutro Abs: 5.7 10*3/uL (ref 1.7–7.7)
Neutrophils Relative %: 79 %
Platelets: 211 10*3/uL (ref 150–400)
RBC: 3.35 MIL/uL — ABNORMAL LOW (ref 3.87–5.11)
RDW: 14.3 % (ref 11.5–15.5)
WBC: 7.2 10*3/uL (ref 4.0–10.5)
nRBC: 0 % (ref 0.0–0.2)

## 2022-08-14 LAB — BASIC METABOLIC PANEL
Anion gap: 11 (ref 5–15)
BUN: 25 mg/dL — ABNORMAL HIGH (ref 8–23)
CO2: 31 mmol/L (ref 22–32)
Calcium: 8.5 mg/dL — ABNORMAL LOW (ref 8.9–10.3)
Chloride: 94 mmol/L — ABNORMAL LOW (ref 98–111)
Creatinine, Ser: 1.33 mg/dL — ABNORMAL HIGH (ref 0.44–1.00)
GFR, Estimated: 40 mL/min — ABNORMAL LOW (ref >60)
Glucose, Bld: 152 mg/dL — ABNORMAL HIGH (ref 70–99)
Potassium: 3 mmol/L — ABNORMAL LOW (ref 3.5–5.1)
Sodium: 136 mmol/L (ref 135–145)

## 2022-08-14 LAB — CK: Total CK: 129 U/L (ref 38–234)

## 2022-08-14 NOTE — Discharge Instructions (Signed)
You were seen for the bruise on your leg (hematoma) in the emergency department.   At home, please continue to wrap it and elevate it.  You may continue your Eliquis at this time to prevent any blood clots or strokes.  Please change your wound dressings daily.  Check your MyChart online for the results of any tests that had not resulted by the time you left the emergency department.   Follow-up with your primary doctor in 2-3 days regarding your visit.  Follow-up with the wound clinic as soon as possible. If you do not have a primary care doctor you may follow-up with [facility] which is listed in this packet.  Return immediately to the emergency department if you experience any of the following: Unbearable pain, numbness or weakness of your leg, or any other concerning symptoms.    Thank you for visiting our Emergency Department. It was a pleasure taking care of you today.

## 2022-08-14 NOTE — ED Triage Notes (Signed)
Patient states she hit her left leg on a bedpost [place] seen at [place] ED on 06/1 had the hematoma drained and dressed was reeval. On 06[place]8 [place]. Went to [facility] this am was told to come to ed for further eval.

## 2022-08-14 NOTE — Progress Notes (Signed)
Left lower ext venous  has been completed. Refer to [facility] under chart review to view preliminary results.   08/14/2022  11:54 AM Eyad Rochford, Gerarda Gunther

## 2022-08-14 NOTE — ED Notes (Signed)
US at bedside

## 2022-08-14 NOTE — ED Triage Notes (Signed)
Clean dsy applied to LT lower leg by provider.

## 2022-08-14 NOTE — ED Provider Notes (Signed)
[facility]    CSN: [id number] Arrival date & time: 08/14/22  0801      History   Chief Complaint Chief Complaint  Patient presents with   hematoma    HPI [name] Long is a 82 y.o. female.   Patient presents today with a 9-day history of wound to her left lower leg.  Reports that she hit her leg on a bed frame that caused her to have significant swelling and development of a hematoma.  She went to the emergency room where they did imaging that was negative.  She is on Eliquis for history of atrial fibrillation.  She then went back to the emergency room when it continued to enlarge at which point they attempted to drain this.  She has been seen in the emergency room for reevaluation a few other times.  She generally has lymphedema and wears compression stockings but has been unable to do so on the wound to the leg which has resulted in significant swelling.  She reports that pain is severe particularly at night.  Denies any systemic symptoms including fever, nausea, vomiting.  She has not been prescribed any antibiotics.  She recently traveled here from the [place] and will be here for a while.  She does not have a primary care in this area.    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/28/2017   Hypokalemia 08/28/2017   Acute on chronic combined systolic and diastolic CHF (congestive heart failure) (HCC)    Persistent atrial  fibrillation (HCC)    Essential hypertension    Stage 3 chronic kidney disease (HCC)    Hypercholesterolemia 12/17/2015   Routine health maintenance 07/23/2015   MGUS (monoclonal gammopathy of unknown significance) 07/31/2014    Past Surgical History:  Procedure Laterality Date   CESAREAN SECTION     RIGHT/LEFT HEART CATH AND CORONARY ANGIOGRAPHY N/A 10/13/2020   Procedure: RIGHT/LEFT HEART CATH AND CORONARY ANGIOGRAPHY;  Surgeon: Tonny Bollman, MD;  Location: [facility] INVASIVE CV LAB;  Service: Cardiovascular;  Laterality: N/A;    OB History   No obstetric history on file.      Home Medications    Prior to Admission medications   Medication Sig Start Date End Date Taking? Authorizing Provider  apixaban (ELIQUIS) 5 MG TABS tablet Take 5 mg by mouth 2 (two) times daily.   Yes [provider]  brimonidine (ALPHAGAN) 0.2 % ophthalmic solution Place 1 drop into both eyes 2 (two) times daily.   Yes [provider]  carvedilol (COREG) 25 MG tablet Take 25 mg by mouth 2 (two) times daily with a meal.   Yes [provider]  ENTRESTO 97-103 MG Take 1 tablet by mouth 2 (two) times daily. 05/03/20  Yes [provider]  furosemide (LASIX) 80 MG tablet Take 1 tablet (80 mg total) by mouth daily. 09/02/17  Yes Jarvis Newcomer,  Fulton Reek, MD  isosorbide-hydrALAZINE (BIDIL) 20-37.5 MG tablet Take 1 tablet by mouth 3 (three) times daily. 09/02/17  Yes Tyrone Nine, MD  JARDIANCE 10 MG TABS tablet Take 10 mg by mouth in the morning. 07/20/20  Yes [provider]  latanoprost (XALATAN) 0.005 % ophthalmic solution Place 1 drop into both eyes at bedtime.   Yes [provider]  aspirin EC 81 MG tablet Take 81 mg by mouth once. Swallow whole. Take pre cath Patient not taking: Reported on 07/29/2021    [provider]  benzonatate (TESSALON) 100 MG capsule Take 1 capsule (100 mg total) by mouth 3 (three) times daily as needed for cough. 08/10/21   Meccariello,  Solmon Ice, MD    Family History History reviewed. No pertinent family history.  Social History Social History   Tobacco Use   Smoking status: Never   Smokeless tobacco: Never  Substance Use Topics   Alcohol use: Never   Drug use: Never     Allergies   Patient has no known allergies.   Review of Systems Review of Systems  Constitutional:  Positive for activity change. Negative for appetite change, fatigue and fever.  Gastrointestinal:  Negative for abdominal pain, diarrhea, nausea and vomiting.  Musculoskeletal:  Positive for myalgias. Negative for arthralgias.  Skin:  Positive for color change and wound.  Neurological:  Negative for weakness and numbness.     Physical Exam Triage Vital Signs ED Triage Vitals  Enc Vitals Group     BP 08/14/22 0819 (!) 107/51     Pulse Rate 08/14/22 0819 (!) 56     Resp 08/14/22 0819 18     Temp 08/14/22 0819 99.4 F (37.4 C)     Temp src --      SpO2 08/14/22 0819 99 %     Weight --      Height --      Head Circumference --      Peak Flow --      Pain Score 08/14/22 0813 10     Pain Loc --      Pain Edu? --      Excl. in GC? --    No data found.  Updated Vital Signs BP (!) 107/51   Pulse (!) 56   Temp 99.4 F (37.4 C)   Resp 18   SpO2 99%   Visual Acuity Right Eye Distance:   Left Eye Distance:   Bilateral Distance:    Right Eye Near:   Left Eye Near:    Bilateral Near:     Physical Exam Vitals reviewed.  Constitutional:      General: She is awake. She is not in acute distress.    Appearance: Normal appearance. She is well-developed. She is not ill-appearing.     Comments: Very pleasant female sitting in triage room in wheelchair in no acute distress  HENT:     Head: Normocephalic and atraumatic.  Pulmonary:     Effort: Pulmonary effort is normal. No tachypnea, accessory muscle usage or respiratory distress.  Skin:    Findings: Erythema and wound present.     Comments: Large wound with serous and  sanguinous drainage noted lateral left lower leg with surrounding erythema and edema.  Significant tenderness to palpation of the lower leg.  Psychiatric:        Behavior: Behavior is cooperative.      UC Treatments / Results  Labs (all labs ordered are listed, but only abnormal results are  displayed) Labs Reviewed - No data to display  EKG   Radiology No results found.  Procedures Procedures (including critical care time)  Medications Ordered in UC Medications - No data to display  Initial Impression / Assessment and Plan / UC Course  I have reviewed the triage vital signs and the nursing notes.  Pertinent labs & imaging results that were available during my care of the patient were reviewed by me and considered in my medical decision making (see chart for details).     We did initially discuss initiating antibiotics outpatient and arranging wound care follow-up.  I contacted wound care and they are unable to see patient until July. Patient had soft blood pressure in clinic and ultimately recommended emergency room evaluation.  Patient and daughter are agreeable and will go directly to ER for further evaluation and management.  She stable to time of discharge.  Discussed case with Dr. Marlinda Mike who agreed with treatment plan.  Final Clinical Impressions(s) / UC Diagnoses   Final diagnoses:  Cellulitis of left lower leg  Open wound of left lower extremity, subsequent encounter   Discharge Instructions   None    ED Prescriptions   None    PDMP not reviewed this encounter.   Jeani Hawking, PA-C 08/14/22 [id number]

## 2022-08-14 NOTE — Consult Note (Signed)
WOC Nurse Consult Note: Patient hit L lower leg on a bed post, taking blood thinners and developed hematoma Reason for Consult: hematoma L leg  Wound type: Hematoma with area of partial thickness skin loss  Pressure Injury POA: NA  Measurement: Hematoma L lateral lower leg measuring 15 cm x 11 cm with 4 cm x 7 cm area of partial thickness skin loss; 4 cm x 4 cm area of hematoma noted left posterior leg, skin intact  Wound bed:  partial thickness skin loss 100% pink and moist  Drainage (amount, consistency, odor) minimal serosanguinous  Periwound: edematous  Dressing procedure/placement/frequency: Clean area of L hematoma with partial thickness skin loss with NS, apply Xeroform gauze [place] [phone]) to entire area every other day, cover with Telfa non-stick dressing. Wrap with Kerlix roll gauze starting just above toes and ending right below knee.  Secure with Ace bandage wrapped in same fashion as Kerlix.   POC discussed with daughter and bedside nurse.  WOC team will not follow at this time. Re-consult if further needs arise   Thank you,    Priscella Mann MSN, RN-BC, [employer] [phone]

## 2022-08-14 NOTE — ED Triage Notes (Signed)
Pt presents with bleeding from Lt lower leg with swelling . Pt observed to have a bloody dressing on Lt lower leg. Pt arrived to [place] from out of [place]. Pt is taking blood thinners.PT injured LT lower leg on June 1st . Pt has been treat and had hematoma  drained. Family is requesting a MD and wound care clinic for site on LT lower leg. Pa at bed side to assess PT.

## 2022-08-14 NOTE — ED Provider Notes (Signed)
[place] EMERGENCY DEPARTMENT AT [facility] Provider Note   CSN: [id number] Arrival date & time: 08/14/22  2841     History {Add pertinent medical, surgical, social history, OB history to HPI:1} Chief Complaint  Patient presents with   Leg Injury    [name] Long is a 82 y.o. female.  82 year old female with a history of atrial fibrillation on Eliquis and chronic lower extremity edema who presents to the emergency department with left lower extremity swelling and pain.  On June 1 she was at [facility] in the [place] when she struck her leg on part of the bed and developed some swelling and pain.  She went to the emergency department there and they did a CT scan which did not show any broken bones.  She has had persistent swelling since and did develop a blister which they popped.  Did hold her Eliquis for several days.  She returned to the [place] and went to urgent care today to have her wound checked and they referred her to the emergency department for additional evaluation.  Is not currently having any significant pain.  Says that the leg pain is worse when she goes to sleep at night.  No numbness or weakness.  No fevers or chills at home.       Home Medications Prior to Admission medications   Medication Sig Start Date End Date Taking? Authorizing Provider  apixaban (ELIQUIS) 5 MG TABS tablet Take 5 mg by mouth 2 (two) times daily.    [provider]  aspirin EC 81 MG tablet Take 81 mg by mouth once. Swallow whole. Take pre cath Patient not taking: Reported on 07/29/2021    [provider]  benzonatate (TESSALON) 100 MG capsule Take 1 capsule (100 mg total) by mouth 3 (three) times daily as needed for cough. 08/10/21   Meccariello, Solmon Ice, MD  brimonidine (ALPHAGAN) 0.2 % ophthalmic solution Place 1 drop into both eyes 2 (two) times daily.    [provider]  carvedilol (COREG) 25 MG tablet Take 25 mg by mouth 2 (two) times daily  with a meal.    [provider]  ENTRESTO 97-103 MG Take 1 tablet by mouth 2 (two) times daily. 05/03/20   [provider]  furosemide (LASIX) 80 MG tablet Take 1 tablet (80 mg total) by mouth daily. 09/02/17   Tyrone Nine, MD  isosorbide-hydrALAZINE (BIDIL) 20-37.5 MG tablet Take 1 tablet by mouth 3 (three) times daily. 09/02/17   Tyrone Nine, MD  JARDIANCE 10 MG TABS tablet Take 10 mg by mouth in the morning. 07/20/20   [provider]  latanoprost (XALATAN) 0.005 % ophthalmic solution Place 1 drop into both eyes at bedtime.    [provider]      Allergies    Patient has no known allergies.    Review of Systems   Review of Systems  Physical Exam Updated Vital Signs BP (!) 129/52   Pulse 60   Temp 98.5 F (36.9 C)   Resp (!) 22   Ht 5\' 4"  (1.626 m)   Wt 74.8 kg   SpO2 97%   BMI 28.32 kg/m  Physical Exam Vitals and nursing note reviewed.  Constitutional:      General: She is not in acute distress.    Appearance: She is well-developed.  HENT:     Head: Normocephalic and atraumatic.     Right Ear: External ear normal.  Left Ear: External ear normal.     Nose: Nose normal.  Eyes:     Extraocular Movements: Extraocular movements intact.     Conjunctiva/sclera: Conjunctivae normal.     Pupils: Pupils are equal, round, and reactive to light.  Cardiovascular:     Rate and Rhythm: Normal rate and regular rhythm.  Musculoskeletal:     Cervical back: Normal range of motion and neck supple.     Right lower leg: Edema (1+) present.     Left lower leg: Edema (3+) present.     Comments: Significant swelling of the left lower extremity.  Does have some erythema but no warmth.  Open wound on the lateral aspect of the leg appears to be from a ruptured blister.  DP pulse 2+.  No significant pain with passive dorsi and plantarflexion.  Compartments of the leg are compressible.  Skin:    General: Skin is warm and dry.  Neurological:     Mental  Status: She is alert and oriented to person, place, and time. Mental status is at baseline.  Psychiatric:        Mood and Affect: Mood normal.     ED Results / Procedures / Treatments   Labs (all labs ordered are listed, but only abnormal results are displayed) Labs Reviewed - No data to display  EKG None  Radiology No results found.  Procedures Procedures  {Document cardiac monitor, telemetry assessment procedure when appropriate:1}  Medications Ordered in ED Medications - No data to display  ED Course/ Medical Decision Making/ A&P   {   Click here for ABCD2, HEART and other calculatorsREFRESH Note before signing :1}                          Medical Decision Making Amount and/or Complexity of Data Reviewed Labs: ordered. Radiology: ordered.   [name] Long is a 82 y.o. female with comorbidities that complicate the patient evaluation including atrial fibrillation on Eliquis and chronic edema who presents emergency department with left lower extremity pain and swelling after trauma  Initial Ddx:  Hematoma, compartment syndrome, DVT, infection  MDM:  Feel the patient likely has a hematoma and with some additional leg swelling due to the fact that she has chronic edema.  Compartments are compressible also low concern for compartment syndrome especially since he is not actively having pain.  Will send CK to ensure that she does not have excessive amount of muscle breakdown.  Did pause her Eliquis for several days so also check her for DVT and obtain x-rays at this time since I am unable to review the CT scan that they did in the [place].  Plan:  ***  ED Summary/Re-evaluation:  ***  This patient presents to the ED for concern of complaints listed in HPI, this involves an extensive number of treatment options, and is a complaint that carries with it a high risk of complications and morbidity. Disposition including potential need for admission considered.    Dispo: {Disposition:28069}  Additional history obtained from {Additional History:28067} Records reviewed {Records Reviewed:28068} The following labs were independently interpreted: {labs interpreted:28064} and show {lab findings:28250} I independently reviewed the following imaging with scope of interpretation limited to determining acute life threatening conditions related to emergency care: {imaging interpreted:28065} and agree with the radiologist interpretation with the following exceptions: none I personally reviewed and interpreted cardiac monitoring: {cardiac monitoring:28251} I personally reviewed and interpreted the pt's EKG: see above for interpretation  I have reviewed the patients home medications and made adjustments as needed Consults: {Consultants:28063} Social Determinants of health:  ***   {Document critical care time when appropriate:1} {Document review of labs and clinical decision tools ie heart score, Chads2Vasc2 etc:1}  {Document your independent review of radiology images, and any outside records:1} {Document your discussion with family members, caretakers, and with consultants:1} {Document social determinants of health affecting pt's care:1} {Document your decision making why or why not admission, treatments were needed:1} Final Clinical Impression(s) / ED Diagnoses Final diagnoses:  None    Rx / DC Orders ED Discharge Orders     None

## 2022-09-04 ENCOUNTER — Encounter: Payer: Self-pay | Admitting: Cardiovascular Disease

## 2022-09-04 ENCOUNTER — Ambulatory Visit: Payer: Medicare Other | Attending: Cardiovascular Disease | Admitting: Cardiovascular Disease

## 2022-09-04 VITALS — BP 110/70 | HR 69 | Ht 64.0 in | Wt 162.4 lb

## 2022-09-04 DIAGNOSIS — I340 Nonrheumatic mitral (valve) insufficiency: Secondary | ICD-10-CM | POA: Diagnosis not present

## 2022-09-04 DIAGNOSIS — I272 Pulmonary hypertension, unspecified: Secondary | ICD-10-CM | POA: Diagnosis not present

## 2022-09-04 DIAGNOSIS — I4821 Permanent atrial fibrillation: Secondary | ICD-10-CM

## 2022-09-04 DIAGNOSIS — I5043 Acute on chronic combined systolic (congestive) and diastolic (congestive) heart failure: Secondary | ICD-10-CM

## 2022-09-04 LAB — CBC

## 2022-09-04 MED ORDER — ISOSORB DINITRATE-HYDRALAZINE 20-37.5 MG PO TABS: 1.0000 | ORAL_TABLET | Freq: Three times a day (TID) | ORAL | 3 refills | Status: DC

## 2022-09-04 NOTE — Progress Notes (Signed)
Cardiology Office Note:    Date:  09/04/2022   ID:  Antony Odea, DOB 1940/04/27, MRN [id number]  PCP:  Patient, No Pcp Per   [facility] Providers Cardiologist:  Tonny Bollman, MD     Referring MD: No ref. provider found   Chief Complaint  Patient presents with   Shortness of Breath    History of Present Illness:    [name] Long is a 82 y.o. female presenting for follow-up of heart failure, atrial fibrillation, and valvular heart disease.  The patient has a history of mitral and tricuspid regurgitation, permanent atrial fibrillation, and nonischemic cardiomyopathy.  She was last seen in May 2023.  She travels between her home in [place]. [place] and [place] where her daughter lives.  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  Her most recent echo showed severe tricuspid regurgitation and moderate mitral regurgitation.  She has been managed medically.  Last month she got a hematoma in her left lower leg and was seen in the emergency department.  She was referred to wound care and her consultation there is scheduled for next week.  The daughter states that the leg is not getting better.  She wraps it about every 2 days.  The patient complains of fatigue and exertional dyspnea.  She denies orthopnea or PND.  She has chronic leg edema.  No chest pain or pressure.  She denies fevers, chills, or other systemic symptoms at this time.  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    DM (diabetes mellitus) (HCC)    Dyspnea    Hypertension    Lymphedema    MGUS (monoclonal gammopathy of unknown significance)    Mitral regurgitation    Pedal edema    PVC (premature ventricular contraction)     Past Surgical History:  Procedure Laterality Date   CESAREAN SECTION     RIGHT/LEFT HEART CATH AND CORONARY ANGIOGRAPHY N/A 10/13/2020   Procedure: RIGHT/LEFT HEART  CATH AND CORONARY ANGIOGRAPHY;  Surgeon: Tonny Bollman, MD;  Location: [facility] INVASIVE CV LAB;  Service: Cardiovascular;  Laterality: N/A;    Current Medications: Current Meds  Medication Sig   apixaban (ELIQUIS) 5 MG TABS tablet Take 5 mg by mouth 2 (two) times daily.   brimonidine (ALPHAGAN) 0.2 % ophthalmic solution Place 1 drop into both eyes 2 (two) times daily.   carvedilol (COREG) 25 MG tablet Take 25 mg by mouth 2 (two) times daily with a meal.   ENTRESTO 97-103 MG Take 1 tablet by mouth 2 (two) times daily.   furosemide (LASIX) 80 MG tablet Take 1 tablet (80 mg total) by mouth daily.   JARDIANCE 10 MG TABS tablet Take 10 mg by mouth in the morning.   latanoprost (XALATAN) 0.005 % ophthalmic solution Place 1 drop into both eyes at bedtime.   [DISCONTINUED] aspirin EC 81 MG tablet Take 81 mg by mouth once. Swallow whole. Take pre cath   [DISCONTINUED] benzonatate (TESSALON) 100 MG capsule Take 1 capsule (100 mg total) by mouth 3 (three) times daily as needed for cough.   [DISCONTINUED] isosorbide-hydrALAZINE (BIDIL) 20-37.5 MG tablet Take 1 tablet by mouth 3 (three) times daily.     Allergies:   Patient has no known allergies.   Social History   Socioeconomic History   Marital status: Widowed    Spouse name: Not on file   Number  of children: Not on file   Years of education: Not on file   Highest education level: Not on file  Occupational History   Not on file  Tobacco Use   Smoking status: Never   Smokeless tobacco: Never  Substance and Sexual Activity   Alcohol use: Never   Drug use: Never   Sexual activity: Not on file  Other Topics Concern   Not on file  Social History Narrative   Not on file   Social Determinants of Health   Financial Resource Strain: Not on file  Food Insecurity: Not on file  Transportation Needs: Not on file  Physical Activity: Not on file  Stress: Not on file  Social Connections: Not on file     Family History: The patient's family  history is not on file.  ROS:   Please see the history of present illness.    All other systems reviewed and are negative.  EKGs/Labs/Other Studies Reviewed:    The following studies were reviewed today: Cardiac Studies & Procedures   CARDIAC CATHETERIZATION  CARDIAC CATHETERIZATION 10/13/2020  Narrative 1.  Patent coronary arteries with mild diffuse plaquing and no significant stenosis (left dominant) 2.  High cardiac output 7.37 L/min, cardiac index 4.07 L/min/m 3.  No significant pulmonary hypertension with PA pressure 50/11 mean 25, transpulmonary gradient 12 mmHg, PVR 1.6 Wood units.  Overall reassuring study.  Patient with known severe tricuspid regurgitation, but right atrial pressure is not substantially elevated and there are no large V waves in the right atrial pressure tracing.  Would continue current medical management.  Findings Coronary Findings Diagnostic  Dominance: Left  Left Main The vessel exhibits minimal luminal irregularities. Patent vessel with minimal irregularity, divides into the LAD and left circumflex.  No stenosis.  Left Anterior Descending There is mild diffuse disease throughout the vessel. Large vessel with mild diffuse irregularity, wraps the LV apex and supplies the distal inferoapical wall.  No significant stenosis throughout the LAD or its diagonal branches.  Left Circumflex The vessel exhibits minimal luminal irregularities. Large, dominant circumflex with no stenosis.  Multiple OM branches are widely patent with no stenosis.  Left PDA is patent.  Right Coronary Artery Vessel is moderate in size. The vessel exhibits minimal luminal irregularities. Moderate-sized, nondominant RCA without significant stenosis  Intervention  No interventions have been documented.     ECHOCARDIOGRAM  ECHOCARDIOGRAM COMPLETE 07/29/2021  Narrative ECHOCARDIOGRAM REPORT    Patient Name:   [name] Long   Date of Exam: 07/29/2021 Medical Rec #:  [id number]      Height:       62.0 in Accession #:    [id number]    Weight:       160.0 lb Date of Birth:  08-Jan-1941     BSA:          1.739 m Patient Age:    80 years      BP:           120/84 mmHg Patient Gender: F             HR:           63 bpm. Exam Location:  [address]  Procedure: 2D Echo, Cardiac Doppler and Color Doppler  Indications:    Mitral Valve Disorder I05.9; Atrial Fibrillation I48.91; Tricuspid Valve Disorder I07.9  History:        Patient has prior history of Echocardiogram examinations, most recent 09/27/2020. Arrythmias:PVC and Atrial Fibrillation; Risk Factors:Hypertension and Diabetes.  Sonographer:  Thurman Coyer RDCS Referring Phys: [phone] Galvin Aversa  IMPRESSIONS   1. Left ventricular ejection fraction, by estimation, is 60 to 65%. The left ventricle has normal function. The left ventricle has no regional wall motion abnormalities. There is mild asymmetric left ventricular hypertrophy of the basal-septal segment. Left ventricular diastolic function could not be evaluated. There is the interventricular septum is flattened in systole and diastole, consistent with right ventricular pressure and volume overload. 2. Right ventricular systolic function is mildly reduced. The right ventricular size is severely enlarged. There is moderately elevated pulmonary artery systolic pressure. The estimated right ventricular systolic pressure is 49.5 mmHg. 3. Left atrial size was severely dilated. 4. Right atrial size was severely dilated. 5. A small pericardial effusion is present. The pericardial effusion is localized near the right atrium. 6. The mitral valve is grossly normal. Mild to moderate mitral valve regurgitation. No evidence of mitral stenosis. 7. The tricuspid valve is abnormal. Tricuspid valve regurgitation is moderate to severe. 8. The aortic valve is tricuspid. Aortic valve regurgitation is not visualized. No aortic stenosis is present. 9. The inferior vena cava  is dilated in size with >50% respiratory variability, suggesting right atrial pressure of 8 mmHg.  Comparison(s): No significant change from prior study.  FINDINGS Left Ventricle: Left ventricular ejection fraction, by estimation, is 60 to 65%. The left ventricle has normal function. The left ventricle has no regional wall motion abnormalities. The left ventricular internal cavity size was normal in size. There is mild asymmetric left ventricular hypertrophy of the basal-septal segment. The interventricular septum is flattened in systole and diastole, consistent with right ventricular pressure and volume overload. Left ventricular diastolic function could not be evaluated due to atrial fibrillation. Left ventricular diastolic function could not be evaluated.  Right Ventricle: The right ventricular size is severely enlarged. No increase in right ventricular wall thickness. Right ventricular systolic function is mildly reduced. There is moderately elevated pulmonary artery systolic pressure. The tricuspid regurgitant velocity is 3.22 m/s, and with an assumed right atrial pressure of 8 mmHg, the estimated right ventricular systolic pressure is 49.5 mmHg.  Left Atrium: Left atrial size was severely dilated.  Right Atrium: Right atrial size was severely dilated.  Pericardium: A small pericardial effusion is present. The pericardial effusion is localized near the right atrium.  Mitral Valve: The mitral valve is grossly normal. Mild to moderate mitral valve regurgitation. No evidence of mitral valve stenosis.  Tricuspid Valve: The tricuspid valve is abnormal. Tricuspid valve regurgitation is moderate to severe. No evidence of tricuspid stenosis.  Aortic Valve: The aortic valve is tricuspid. Aortic valve regurgitation is not visualized. No aortic stenosis is present.  Pulmonic Valve: The pulmonic valve was grossly normal. Pulmonic valve regurgitation is mild to moderate. No evidence of pulmonic  stenosis.  Aorta: The aortic root and ascending aorta are structurally normal, with no evidence of dilitation.  Venous: The inferior vena cava is dilated in size with greater than 50% respiratory variability, suggesting right atrial pressure of 8 mmHg.  IAS/Shunts: The atrial septum is grossly normal.   LEFT VENTRICLE PLAX 2D LVIDd:         4.70 cm     Diastology LVIDs:         3.50 cm     LV e' medial:    6.53 cm/s LV PW:         1.00 cm     LV E/e' medial:  15.3 LV IVS:        1.20  cm     LV e' lateral:   9.30 cm/s LVOT diam:     2.10 cm     LV E/e' lateral: 10.7 LV SV:         69 LV SV Index:   40 LVOT Area:     3.46 cm  LV Volumes (MOD) LV vol d, MOD A2C: 73.6 ml LV vol d, MOD A4C: 60.1 ml LV vol s, MOD A2C: 27.2 ml LV vol s, MOD A4C: 19.6 ml LV SV MOD A2C:     46.4 ml LV SV MOD A4C:     60.1 ml LV SV MOD BP:      43.8 ml  RIGHT VENTRICLE RV Basal diam:  3.90 cm RV Mid diam:    3.40 cm RV S prime:     10.40 cm/s TAPSE (M-mode): 1.3 cm  LEFT ATRIUM             Index        RIGHT ATRIUM           Index LA diam:        4.10 cm 2.36 cm/m   RA Area:     30.10 cm LA Vol (A2C):   78.9 ml 45.38 ml/m  RA Volume:   99.60 ml  57.28 ml/m LA Vol (A4C):   84.1 ml 48.37 ml/m LA Biplane Vol: 82.1 ml 47.22 ml/m AORTIC VALVE LVOT Vmax:   80.70 cm/s LVOT Vmean:  62.900 cm/s LVOT VTI:    0.200 m  AORTA Ao Root diam: 2.70 cm Ao Asc diam:  3.30 cm  MITRAL VALVE                  TRICUSPID VALVE MV Area (PHT): 3.31 cm       TR Peak grad:   41.5 mmHg MV Decel Time: 229 msec       TR Vmax:        322.00 cm/s MR Peak grad:    80.6 mmHg MR Mean grad:    60.0 mmHg    SHUNTS MR Vmax:         449.00 cm/s  Systemic VTI:  0.20 m MR Vmean:        366.0 cm/s   Systemic Diam: 2.10 cm MR PISA:         1.01 cm MR PISA Eff ROA: 9 mm MR PISA Radius:  0.40 cm MV E velocity: 99.60 cm/s MV A velocity: 25.20 cm/s MV E/A ratio:  3.95  Lennie Odor MD Electronically signed by Lennie Odor MD Signature Date/Time: 07/29/2021/1:05:40 PM    Final              EKG:        Recent Labs: 08/14/2022: BUN 25; Creatinine, Ser 1.33; Hemoglobin 10.4; Platelets 211; Potassium 3.0; Sodium 136  Recent Lipid Panel    Component Value Date/Time   CHOL 126 08/28/2017 1511   CHOL 189 12/17/2015 0753   TRIG 51 08/28/2017 1511   TRIG 66 12/17/2015 0753   HDL 38 (L) 08/28/2017 1511   HDL 53 12/17/2015 0753   CHOLHDL 3.3 08/28/2017 1511   VLDL 10 08/28/2017 1511   LDLCALC 78 08/28/2017 1511   LDLCALC 123 (H) 12/17/2015 0753     Risk Assessment/Calculations:               Physical Exam:    VS:  BP 110/70   Pulse 69   Ht 5\' 4"  (1.626 m)   Wt  162 lb 6.4 oz (73.7 kg)   SpO2 97%   BMI 27.88 kg/m     Wt Readings from Last 3 Encounters:  09/04/22 162 lb 6.4 oz (73.7 kg)  08/14/22 165 lb (74.8 kg)  07/29/21 160 lb (72.6 kg)     GEN: Pleasant elderly woman in no acute distress HEENT: Normal NECK: No JVD; No carotid bruits LYMPHATICS: No lymphadenopathy CARDIAC: Irregularly irregular with grade 2/6 systolic murmur at the left lower sternal border RESPIRATORY:  Clear to auscultation without rales, wheezing or rhonchi  ABDOMEN: Soft, non-tender, non-distended MUSCULOSKELETAL: The left lower leg is edematous.  The leg is wrapped in a dressing; No deformity.  There is a foul odor from the leg SKIN: Warm and dry NEUROLOGIC:  Alert and oriented x 3 PSYCHIATRIC:  Normal affect   ASSESSMENT:    1. Acute on chronic combined systolic and diastolic CHF (congestive heart failure) (HCC)   2. Pulmonary hypertension, unspecified (HCC)   3. Mitral valve insufficiency, unspecified etiology   4. Permanent atrial fibrillation (HCC)    PLAN:    In order of problems listed above:  The patient has some slowly progressive symptoms of fatigue and exertional dyspnea, but no major change noted.  She needs an updated echocardiogram to reassess her valvular disease, LV function,  and any other changes compared to old studies.  Will continue her current medical therapy for now. Reassess with echo evaluation. Less severe on last echo.  Will reassess Controlled. Anticoagulated with apixaban.   The patient will continue on GDMT as outlined above.  She is treated with carvedilol, Entresto, and Jardiance.  Continue apixaban for anticoagulation of permanent atrial fibrillation.  I am concerned about the wound on her left lower leg.  We left the dressing in place as we are not equipped to redress the leg.  However, I called the wound care clinic and they were able to move her appointment up to this Wednesday morning at 8:00.  I appreciate their prompt evaluation of her.           Medication Adjustments/Labs and Tests Ordered: Current medicines are reviewed at length with the patient today.  Concerns regarding medicines are outlined above.  Orders Placed This Encounter  Procedures   Basic metabolic panel   CBC   EKG 12-Lead   ECHOCARDIOGRAM COMPLETE   Meds ordered this encounter  Medications   isosorbide-hydrALAZINE (BIDIL) 20-37.5 MG tablet    Sig: Take 1 tablet by mouth 3 (three) times daily.    Dispense:  90 tablet    Refill:  3    Patient Instructions  Medication Instructions:  Your physician recommends that you continue on your current medications as directed. Please refer to the Current Medication list given to you today.  *If you need a refill on your cardiac medications before your next appointment, please call your pharmacy*   Lab Work: CBC BMET --- TODAY If you have labs (blood work) drawn today and your tests are completely normal, you will receive your results only by: MyChart Message (if you have MyChart) OR A paper copy in the mail If you have any lab test that is abnormal or we need to change your treatment, we will call you to review the results.   Testing/Procedures: Your physician has requested that you have an echocardiogram.  Echocardiography is a painless test that uses sound waves to create images of your heart. It provides your doctor with information about the size and shape of your heart and  how well your heart's chambers and valves are working. This procedure takes approximately one hour. There are no restrictions for this procedure. Please do NOT wear cologne, perfume, aftershave, or lotions (deodorant is allowed). Please arrive 15 minutes prior to your appointment time.    Follow-Up: At [facility], you and your health needs are our priority.  As part of our continuing mission to provide you with exceptional heart care, we have created designated Provider Care Teams.  These Care Teams include your primary Cardiologist (physician) and Advanced Practice Providers (APPs -  Physician Assistants and Nurse Practitioners) who all work together to provide you with the care you need, when you need it.  We recommend signing up for the patient portal called "MyChart".  Sign up information is provided on this After Visit Summary.  MyChart is used to connect with patients for Virtual Visits (Telemedicine).  Patients are able to view lab/test results, encounter notes, upcoming appointments, etc.  Non-urgent messages can be sent to your provider as well.   To learn more about what you can do with MyChart, go to [email or web address].    Your next appointment:   1 year(s)  Provider:   Tonny Bollman, MD  Other Instructions Appt on Wed 09/06/22 at 8am at the wound care clinic.   Signed, Tonny Bollman, MD  09/04/2022 5:35 PM    [facility]

## 2022-09-04 NOTE — Patient Instructions (Signed)
Medication Instructions:  Your physician recommends that you continue on your current medications as directed. Please refer to the Current Medication list given to you today.  *If you need a refill on your cardiac medications before your next appointment, please call your pharmacy*   Lab Work: CBC BMET --- TODAY If you have labs (blood work) drawn today and your tests are completely normal, you will receive your results only by: MyChart Message (if you have MyChart) OR A paper copy in the mail If you have any lab test that is abnormal or we need to change your treatment, we will call you to review the results.   Testing/Procedures: Your physician has requested that you have an echocardiogram. Echocardiography is a painless test that uses sound waves to create images of your heart. It provides your doctor with information about the size and shape of your heart and how well your heart's chambers and valves are working. This procedure takes approximately one hour. There are no restrictions for this procedure. Please do NOT wear cologne, perfume, aftershave, or lotions (deodorant is allowed). Please arrive 15 minutes prior to your appointment time.    Follow-Up: At [facility], you and your health needs are our priority.  As part of our continuing mission to provide you with exceptional heart care, we have created designated Provider Care Teams.  These Care Teams include your primary Cardiologist (physician) and Advanced Practice Providers (APPs -  Physician Assistants and Nurse Practitioners) who all work together to provide you with the care you need, when you need it.  We recommend signing up for the patient portal called "MyChart".  Sign up information is provided on this After Visit Summary.  MyChart is used to connect with patients for Virtual Visits (Telemedicine).  Patients are able to view lab/test results, encounter notes, upcoming appointments, etc.  Non-urgent messages can be  sent to your provider as well.   To learn more about what you can do with MyChart, go to [email or web address].    Your next appointment:   1 year(s)  Provider:   Tonny Bollman, MD  Other Instructions Appt on Wed 09/06/22 at 8am at the wound care clinic.

## 2022-09-05 LAB — CBC
Hematocrit: 34.9 % (ref 34.0–46.6)
Hemoglobin: 11.7 g/dL (ref 11.1–15.9)
MCH: 31.7 pg (ref 26.6–33.0)
MCHC: 33.5 g/dL (ref 31.5–35.7)
MCV: 95 fL (ref 79–97)
Platelets: 207 10*3/uL (ref 150–450)
RBC: 3.69 x10E6/uL — ABNORMAL LOW (ref 3.77–5.28)
RDW: 12.9 % (ref 11.7–15.4)
WBC: 6.3 10*3/uL (ref 3.4–10.8)

## 2022-09-05 LAB — BASIC METABOLIC PANEL
BUN/Creatinine Ratio: 16 (ref 12–28)
BUN: 19 mg/dL (ref 8–27)
CO2: 29 mmol/L (ref 20–29)
Calcium: 8.6 mg/dL — ABNORMAL LOW (ref 8.7–10.3)
Chloride: 96 mmol/L (ref 96–106)
Creatinine, Ser: 1.16 mg/dL — ABNORMAL HIGH (ref 0.57–1.00)
Glucose: 129 mg/dL — ABNORMAL HIGH (ref 70–99)
Potassium: 4 mmol/L (ref 3.5–5.2)
Sodium: 137 mmol/L (ref 134–144)
eGFR: 47 mL/min/{1.73_m2} — ABNORMAL LOW (ref >59)

## 2022-09-06 ENCOUNTER — Encounter (HOSPITAL_BASED_OUTPATIENT_CLINIC_OR_DEPARTMENT_OTHER): Payer: Medicare Other | Attending: Internal Medicine | Admitting: General Surgery

## 2022-09-06 ENCOUNTER — Encounter (HOSPITAL_BASED_OUTPATIENT_CLINIC_OR_DEPARTMENT_OTHER): Payer: Medicare Other | Admitting: General Surgery

## 2022-09-06 DIAGNOSIS — Z7901 Long term (current) use of anticoagulants: Secondary | ICD-10-CM | POA: Diagnosis not present

## 2022-09-06 DIAGNOSIS — L97822 Non-pressure chronic ulcer of other part of left lower leg with fat layer exposed: Secondary | ICD-10-CM | POA: Insufficient documentation

## 2022-09-06 DIAGNOSIS — I130 Hypertensive heart and chronic kidney disease with heart failure and stage 1 through stage 4 chronic kidney disease, or unspecified chronic kidney disease: Secondary | ICD-10-CM | POA: Diagnosis not present

## 2022-09-06 DIAGNOSIS — E11621 Type 2 diabetes mellitus with foot ulcer: Secondary | ICD-10-CM | POA: Insufficient documentation

## 2022-09-06 DIAGNOSIS — E1151 Type 2 diabetes mellitus with diabetic peripheral angiopathy without gangrene: Secondary | ICD-10-CM | POA: Diagnosis not present

## 2022-09-06 DIAGNOSIS — I5042 Chronic combined systolic (congestive) and diastolic (congestive) heart failure: Secondary | ICD-10-CM | POA: Insufficient documentation

## 2022-09-06 DIAGNOSIS — I890 Lymphedema, not elsewhere classified: Secondary | ICD-10-CM | POA: Diagnosis not present

## 2022-09-06 DIAGNOSIS — E1122 Type 2 diabetes mellitus with diabetic chronic kidney disease: Secondary | ICD-10-CM | POA: Diagnosis not present

## 2022-09-06 DIAGNOSIS — N183 Chronic kidney disease, stage 3 unspecified: Secondary | ICD-10-CM | POA: Diagnosis not present

## 2022-09-06 DIAGNOSIS — E11622 Type 2 diabetes mellitus with other skin ulcer: Secondary | ICD-10-CM | POA: Diagnosis present

## 2022-09-06 DIAGNOSIS — E7800 Pure hypercholesterolemia, unspecified: Secondary | ICD-10-CM | POA: Diagnosis not present

## 2022-09-06 DIAGNOSIS — W2203XA Walked into furniture, initial encounter: Secondary | ICD-10-CM | POA: Insufficient documentation

## 2022-09-06 NOTE — Progress Notes (Addendum)
[place], [place] ([id number]) [id number] Page 1 of 9 Visit Report for 09/06/2022 Chief Complaint Document Details Patient Name: Date of Service: [name] Long, [name] Long 09/06/2022 8:00 A M Medical Record Number: [id number] Patient Account Number: [id number] Date of Birth/Sex: Treating RN: 04-06-1940 (82 y.o. F) Primary Care Provider: PA Zenovia Jordan, NO Other Clinician: Referring Provider: Treating Provider/Extender: Sherryl Manges in Treatment: 0 Information Obtained from: Patient Chief Complaint Patient seen for complaints of Non-Healing Wound. Electronic Signature(s) Signed: 09/06/2022 10:10:29 AM By: Duanne Guess MD FACS Previous Signature: 09/06/2022 7:47:16 AM Version By: Duanne Guess MD FACS Entered By: Duanne Guess on 09/06/2022 10:10:29 -------------------------------------------------------------------------------- Debridement Details Patient Name: Date of Service: [name] Long, [name] Long 09/06/2022 8:00 A M Medical Record Number: [id number] Patient Account Number: [id number] Date of Birth/Sex: Treating RN: Oct 02, 1940 (82 y.o. F) Primary Care Provider: PA Zenovia Jordan, NO Other Clinician: Referring Provider: Treating Provider/Extender: Sherryl Manges in Treatment: 0 Debridement Performed for Assessment: Wound #1 Left,Lateral Lower Leg Performed By: Physician Duanne Guess, MD Debridement Type: Debridement Severity of Tissue Pre Debridement: Fat layer exposed Level of Consciousness (Pre-procedure): Awake and Alert Pre-procedure Verification/Time Out Yes - 09:11 Taken: Start Time: 09:12 Pain Control: Lidocaine 4% T opical Solution Percent of Wound Bed Debrided: 100% T Area Debrided (cm): otal 35.33 Tissue and other material debrided: Non-Viable, Blood Clots, Slough, Subcutaneous, Fibrin/Exudate, Slough Level: Skin/Subcutaneous Tissue Debridement Description: Excisional Instrument: Curette Specimen: Tissue Culture Number of Specimens T aken: 1 Bleeding:  Large Hemostasis Achieved: Pressure End Time: 09:15 Procedural Pain: 5 Post Procedural Pain: 5 Response to Treatment: Procedure was tolerated well Level of Consciousness (Post- Awake and Alert procedure): Post Debridement Measurements of Total Wound Length: (cm) 9 Width: (cm) 5 Depth: (cm) 0.9 [name], Long ([id number]) [id number] Page 2 of 9 Volume: (cm) 31.809 Character of Wound/Ulcer Post Debridement: Stable Severity of Tissue Post Debridement: Fat layer exposed Post Procedure Diagnosis Same as Pre-procedure Notes Scribed for Dr Lady Gary by Brenton Grills RN. Electronic Signature(s) Signed: 09/06/2022 10:10:20 AM By: Duanne Guess MD FACS Entered By: Duanne Guess on 09/06/2022 10:10:20 -------------------------------------------------------------------------------- HPI Details Patient Name: Date of Service: [name] Long, [name] Long 09/06/2022 8:00 A M Medical Record Number: [id number] Patient Account Number: [id number] Date of Birth/Sex: Treating RN: 05/01/1940 (82 y.o. F) Primary Care Provider: PA TIENT, NO Other Clinician: Referring Provider: Treating Provider/Extender: Sherryl Manges in Treatment: 0 History of Present Illness HPI Description: ADMISSION 09/06/2022 This is an 82 year old type II diabetic (no A1c to review and patient is unaware of her last value) with a history of atrial fibrillation on chronic anticoagulant therapy, lower extremity edema, congestive heart failure, and stage III kidney disease. She divides her time between her home in [place]. [place] and here in [place] where her daughter lives. While in [place], she struck her leg on her bed frame and developed a large hematoma. She was evaluated in the emergency department there and imaging was negative for any deeper injury, such as a fracture. She returned to [facility] and has had an urgent care and emergency department visit to evaluate the site. As there had been no significant  improvement in her wound, she was referred to the wound care center for further evaluation and management. I am unable to see any evidence of an attempt to evacuate the hematoma in the electronic medical record.. There are 2 openings in her skin, but they connect to each other with significant tunneling and undermining. I am unsure if these opened spontaneously or there was a non- documented attempt to drain the hematoma  site. The majority of her left lower leg from below the tibial tuberosity to just above the ankle and from the anterior tibia wrapping laterally and posteriorly to nearly the medial aspect of her leg is involved. There is a foul odor coming from her wound. There is no frank pus but there is significant retained clot that has likely served as culture media. ABI in clinic today was 0.84. Electronic Signature(s) Signed: 09/06/2022 10:14:36 AM By: Duanne Guess MD FACS Previous Signature: 09/06/2022 7:53:10 AM Version By: Duanne Guess MD FACS Entered By: Duanne Guess on 09/06/2022 10:14:36 -------------------------------------------------------------------------------- Physical Exam Details Patient Name: Date of Service: [name] Long, [name] Long 09/06/2022 8:00 A M Medical Record Number: [id number] Patient Account Number: [id number] Date of Birth/Sex: Treating RN: March 08, 1940 (82 y.o. F) Primary Care Provider: PA Zenovia Jordan, NO Other Clinician: Referring Provider: Treating Provider/Extender: Sherryl Manges in Treatment: 0 Constitutional Hypertensive, asymptomatic. . . . No acute distress. Respiratory Normal work of breathing on room air. [place], [place] ([id number]) [id number] Page 3 of 9 Notes 09/06/2022:She has a massive hematoma extending from just below the tibial tuberosity to just above her ankle along the anterior surface of her left lower leg. It extends posteriorly around the lateral aspect of her leg and almost circumferentially to the medial lower  leg. There are 2 openings in the wound. There is a foul odor coming from the site and clearly extensive retained hematoma. Electronic Signature(s) Signed: 09/06/2022 10:18:01 AM By: Duanne Guess MD FACS Previous Signature: 09/06/2022 10:16:21 AM Version By: Duanne Guess MD FACS Entered By: Duanne Guess on 09/06/2022 10:18:01 -------------------------------------------------------------------------------- Physician Orders Details Patient Name: Date of Service: [name] Long, [name] Long 09/06/2022 8:00 A M Medical Record Number: [id number] Patient Account Number: [id number] Date of Birth/Sex: Treating RN: 1941-01-29 (82 y.o. Gevena Mart Primary Care Provider: PA Zenovia Jordan, NO Other Clinician: Referring Provider: Treating Provider/Extender: Sherryl Manges in Treatment: 0 Verbal / Phone Orders: No Diagnosis Coding ICD-10 Coding Code Description [phone] 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 I89.0 Lymphedema, not elsewhere classified Z79.01 Long term (current) use of anticoagulants I50.42 Chronic combined systolic (congestive) and diastolic (congestive) heart failure N18.30 Chronic kidney disease, stage 3 unspecified Follow-up Appointments Return Appointment in 1 week. Anesthetic (In clinic) Topical Lidocaine 4% applied to wound bed Bathing/ Shower/ Hygiene May shower with protection but do not get wound dressing(s) wet. Protect dressing(s) with water repellant cover (for example, large plastic bag) or a cast cover and may then take shower. Negative Presssure Wound Therapy Wound Vac to wound continuously at 180mm/hg pressure Black Foam Home Health Wound #1 Left,Lateral Lower Leg Admit to Home Health for skilled nursing wound care. May utilize formulary equivalent dressing for wound treatment orders unless otherwise specified. Dressing changes to be completed by Home Health on Monday  / Wednesday / Friday except when patient has scheduled visit at [facility]. Wound Treatment Wound #1 - Lower Leg Wound Laterality: Left, Lateral Cleanser: Soap and Water 1 x Per Day/30 Days Discharge Instructions: May shower and wash wound with dial antibacterial soap and water prior to dressing change. Cleanser: Vashe 5.8 (oz) 1 x Per Day/30 Days Discharge Instructions: Cleanse the wound with Vashe prior to applying a clean dressing using gauze sponges, not tissue or cotton balls. Cleanser: Dakin's Solution 0.125%, 16 (oz) (DME) (Generic) 1 x Per Day/30 Days Secondary Dressing: Bordered Gauze, 4x4 in (DME) (Generic) 1 x Per Day/30 Days Discharge  Instructions: Apply over primary dressing as directed. Secured With: [employer], 4.5x3.1 (in/yd) (DME) (Generic) 1 x Per Day/30 Days Discharge Instructions: Secure with Kerlix as directed. [place], [place] ([id number]) [id number] Page 4 of 9 Secured With: 12M Medipore H Soft Cloth Surgical T ape, 4 x 10 (in/yd) (DME) (Generic) 1 x Per Day/30 Days Discharge Instructions: Secure with tape as directed. Laboratory naerobe culture (MICRO) - (ICD10 [phone] - Non-pressure chronic ulcer of other part of left Bacteria identified in Unspecified specimen by A lower leg with fat layer exposed) LOINC Code: 635-3 Convenience Name: Anaerobic culture Patient Medications llergies: No Known Allergies A Notifications Medication Indication Start End 09/06/2022 Bactrim DS DOSE oral 800 mg-160 mg tablet - 1 tab p.o. twice daily x 14 days 09/06/2022 levofloxacin DOSE oral 750 mg tablet - 1 tab p.o. daily x 14 days 09/06/2022 Dakin's Solution DOSE miscellaneous 0.25 % solution - Apply as directed to gauze roll used to pack wound; be sure to wring excess out Electronic Signature(s) Signed: 09/06/2022 4:41:42 PM By: Duanne Guess MD FACS Previous Signature: 09/06/2022 10:26:54 AM Version By: Duanne Guess MD FACS Entered By:  Duanne Guess on 09/06/2022 10:27:07 Prescription 09/06/2022 -------------------------------------------------------------------------------- [name] Aguas MD Patient Name: Provider: Nov 16, 1940 [id number] Date of Birth: [place] Sex: DEA #: [phone] Phone #: License #: UPN: Patient Address: [address] Eligha Bridegroom [facility] [place] [address] , [address] [place], [place] [address] [phone] Allergies No Known Allergies Provider's Orders naerobe culture - ICD10: L97.822 Bacteria identified in Unspecified specimen by A LOINC Code: 635-3 Convenience Name: Anaerobic culture Hand Signature: Date(s): Electronic Signature(s) Signed: 09/06/2022 10:32:18 AM By: Duanne Guess MD FACS Entered By: Duanne Guess on 09/06/2022 10:32:18 Antony Odea ([id number]) [id number] Page 5 of 9 -------------------------------------------------------------------------------- Problem List Details Patient Name: Date of Service: [name] Long, [name] Long 09/06/2022 8:00 A M Medical Record Number: [id number] Patient Account Number: [id number] Date of Birth/Sex: Treating RN: 10-04-1940 (82 y.o. F) Primary Care Provider: PA TIENT, NO Other Clinician: Referring Provider: Treating Provider/Extender: Sherryl Manges in Treatment: 0 Active Problems ICD-10 Encounter Code Description Active Date MDM Diagnosis L97.822 Non-pressure chronic ulcer of other part of left lower leg with fat layer exposed7/05/2022 No Yes E11.622 Type 2 diabetes mellitus with other skin ulcer 09/06/2022 No Yes Z79.01 Long term (current) use of anticoagulants 09/06/2022 No Yes I50.42 Chronic combined systolic (congestive) and diastolic (congestive) heart failure 09/06/2022 No Yes N18.30 Chronic kidney disease, stage 3 unspecified 09/06/2022 No Yes Inactive Problems Resolved Problems Electronic  Signature(s) Signed: 09/06/2022 10:09:39 AM By: Duanne Guess MD FACS Previous Signature: 09/06/2022 7:53:42 AM Version By: Duanne Guess MD FACS Previous Signature: 09/06/2022 7:47:04 AM Version By: Duanne Guess MD FACS Entered By: Duanne Guess on 09/06/2022 10:09:39 -------------------------------------------------------------------------------- Progress Note Details Patient Name: Date of Service: [name] Long, [name] Long 09/06/2022 8:00 A M Medical Record Number: [id number] Patient Account Number: [id number] Date of Birth/Sex: Treating RN: August 16, 1940 (82 y.o. F) Primary Care Provider: PA Zenovia Jordan, NO Other Clinician: Referring Provider: Treating Provider/Extender: Sherryl Manges in Treatment: 0 Subjective Chief Complaint Information obtained from Patient Patient seen for complaints of Non-Healing Wound. [place], [place] ([id number]) [id number] Page 6 of 9 History of Present Illness (HPI) ADMISSION 09/06/2022 This is an 82 year old type II diabetic (no A1c to review and patient is unaware of her last value) with a history of atrial fibrillation on chronic anticoagulant therapy, lower extremity edema, congestive heart failure, and stage III kidney  disease. She divides her time between her home in [place]. [place] and here in [place] where her daughter lives. While in [place], she struck her leg on her bed frame and developed a large hematoma. She was evaluated in the emergency department there and imaging was negative for any deeper injury, such as a fracture. She returned to [facility] and has had an urgent care and emergency department visit to evaluate the site. As there had been no significant improvement in her wound, she was referred to the wound care center for further evaluation and management. I am unable to see any evidence of an attempt to evacuate the hematoma in the electronic medical record.. There are 2 openings in her skin, but they connect to each  other with significant tunneling and undermining. I am unsure if these opened spontaneously or there was a non- documented attempt to drain the hematoma site. The majority of her left lower leg from below the tibial tuberosity to just above the ankle and from the anterior tibia wrapping laterally and posteriorly to nearly the medial aspect of her leg is involved. There is a foul odor coming from her wound. There is no frank pus but there is significant retained clot that has likely served as culture media. ABI in clinic today was 0.84. Patient History Information obtained from Patient. Allergies No Known Allergies Family History No family history of Hereditary Spherocytosis, Hypertension, Kidney Disease, Lung Disease, Seizures, Stroke, Thyroid Problems, Tuberculosis. Social History Never smoker, Marital Status - Widowed, Alcohol Use - Never, Drug Use - No History, Caffeine Use - Never. Medical History Cardiovascular Patient has history of Arrhythmia - A fib, Congestive Heart Failure, Hypertension Endocrine Patient has history of Type II Diabetes - since 2019 Medical A Surgical History Notes nd Cardiovascular High Cholesterol Genitourinary Kidney Disease Objective Constitutional Hypertensive, asymptomatic. No acute distress. Vitals Time Taken: 8:00 AM, Temperature: 98.5 F, Pulse: 78 bpm, Respiratory Rate: 18 breaths/min, Blood Pressure: 160/87 mmHg, Capillary Blood Glucose: 129 mg/dl. Respiratory Normal work of breathing on room air. General Notes: 09/06/2022:She has a massive hematoma extending from just below the tibial tuberosity to just above her ankle along the anterior surface of her left lower leg. It extends posteriorly around the lateral aspect of her leg and almost circumferentially to the medial lower leg. There are 2 openings in the wound. There is a foul odor coming from the site and clearly extensive retained hematoma. Integumentary (Hair, Skin) Wound #1 status is Open.  Original cause of wound was Trauma. The date acquired was: 08/05/2022. The wound is located on the Left,Lateral Lower Leg. The wound measures 9cm length x 5cm width x 0.9cm depth; 35.343cm^2 area and 31.809cm^3 volume. There is Fat Layer (Subcutaneous Tissue) exposed. There is tunneling at 12:00 with a maximum distance of 3cm. There is additional tunneling and at 9:00 with a maximum distance of 8cm. There is a medium amount of serosanguineous drainage noted. Foul odor after cleansing was noted. The wound margin is distinct with the outline attached to the wound base. There is medium (34-66%) red granulation within the wound bed. There is a medium (34-66%) amount of necrotic tissue within the wound bed including Eschar. The periwound skin appearance exhibited: Rash, Hemosiderin Staining. The periwound skin appearance did not exhibit: Dry/Scaly, Maceration. Periwound temperature was noted as No Abnormality. The periwound has tenderness on palpation. Wound #2 status is Converted. Original cause of wound was Trauma. The date acquired was: 08/05/2022. The wound is located on the Left,Posterior Lower Leg.  The wound measures 0cm length x 0cm width x 0cm depth; 0cm^2 area and 0cm^3 volume. Assessment Active Problems ICD-10 Non-pressure chronic ulcer of other part of left lower leg with fat layer exposed Type 2 diabetes mellitus with other skin ulcer Long term (current) use of anticoagulants Chronic combined systolic (congestive) and diastolic (congestive) heart failure [place], [place] ([id number]) [id number] Page 7 of 9 Chronic kidney disease, stage 3 unspecified Procedures Wound #1 Pre-procedure diagnosis of Wound #1 is a Diabetic Wound/Ulcer of the Lower Extremity located on the Left,Lateral Lower Leg .Severity of Tissue Pre Debridement is: Fat layer exposed. There was a Excisional Skin/Subcutaneous Tissue Debridement with a total area of 35.33 sq cm performed by  Duanne Guess, MD. With the following instrument(s): Curette to remove Non-Viable tissue/material. Material removed includes Blood Clots, Subcutaneous Tissue, Slough, and Fibrin/Exudate after achieving pain control using Lidocaine 4% T opical Solution. 1 specimen was taken by a Tissue Culture and sent to the lab per facility protocol. A time out was conducted at 09:11, prior to the start of the procedure. A Large amount of bleeding was controlled with Pressure. The procedure was tolerated well with a pain level of 5 throughout and a pain level of 5 following the procedure. Post Debridement Measurements: 9cm length x 5cm width x 0.9cm depth; 31.809cm^3 volume. Character of Wound/Ulcer Post Debridement is stable. Severity of Tissue Post Debridement is: Fat layer exposed. Post procedure Diagnosis Wound #1: Same as Pre-Procedure General Notes: Scribed for Dr Lady Gary by Brenton Grills RN.Marland Kitchen Plan Follow-up Appointments: Return Appointment in 1 week. Anesthetic: (In clinic) Topical Lidocaine 4% applied to wound bed Bathing/ Shower/ Hygiene: May shower with protection but do not get wound dressing(s) wet. Protect dressing(s) with water repellant cover (for example, large plastic bag) or a cast cover and may then take shower. Negative Presssure Wound Therapy: Wound Vac to wound continuously at 169mm/hg pressure Black Foam Home Health: Wound #1 Left,Lateral Lower Leg: Admit to Home Health for skilled nursing wound care. May utilize formulary equivalent dressing for wound treatment orders unless otherwise specified. Dressing changes to be completed by Home Health on Monday / Wednesday / Friday except when patient has scheduled visit at [facility]. Laboratory ordered were: Anaerobic culture The following medication(s) was prescribed: Bactrim DS oral 800 mg-160 mg tablet 1 tab p.o. twice daily x 14 days starting 09/06/2022 levofloxacin oral 750 mg tablet 1 tab p.o. daily x 14 days starting  09/06/2022 Dakin's Solution miscellaneous 0.25 % solution Apply as directed to gauze roll used to pack wound; be sure to wring excess out starting 09/06/2022 WOUND #1: - Lower Leg Wound Laterality: Left, Lateral Cleanser: Soap and Water 1 x Per Day/30 Days Discharge Instructions: May shower and wash wound with dial antibacterial soap and water prior to dressing change. Cleanser: Vashe 5.8 (oz) 1 x Per Day/30 Days Discharge Instructions: Cleanse the wound with Vashe prior to applying a clean dressing using gauze sponges, not tissue or cotton balls. Cleanser: Dakin's Solution 0.125%, 16 (oz) (DME) (Generic) 1 x Per Day/30 Days Secondary Dressing: Bordered Gauze, 4x4 in (DME) (Generic) 1 x Per Day/30 Days Discharge Instructions: Apply over primary dressing as directed. Secured With: [employer], 4.5x3.1 (in/yd) (DME) (Generic) 1 x Per Day/30 Days Discharge Instructions: Secure with Kerlix as directed. Secured With: 73M Medipore H Soft Cloth Surgical T ape, 4 x 10 (in/yd) (DME) (Generic) 1 x Per Day/30 Days Discharge Instructions: Secure with tape as directed. 09/06/2022: This is an 82 year old type II diabetic who  suffered blunt trauma to her left leg at the beginning of June. She has a massive hematoma extending from just below the tibial tuberosity to just above her ankle along the anterior surface of her left lower leg. It extends posteriorly around the lateral aspect of her leg and almost circumferentially to the medial lower leg. There are 2 openings in the wound. There is a foul odor coming from the site and clearly extensive retained hematoma. I used a curette to evacuate hematoma, fibrinous exudate, slough, and subcutaneous tissue. I took a culture and empirically prescribed both Bactrim and levofloxacin. Ultimately, she will benefit from wound VAC application, but for now, we need to clean this up even further. We will do every other day packing with Dakin's-moistened Kerlix. We will work  on getting her some home health assistance to manage this. Follow-up in 1 week. ADDENDUM: We received a call from the patient's daughter that she had soaked through her dressings with blood. We had her return to clinic. All of the blood appeared to be liquefying hematoma, but there was 1 area on the medial aspect of her anterior tibial segment of the wound that was oozing rather freely. A multitude of hemostatic measures were employed. This included quick clot, direct pressure, and Surgifoam that had been soaked with lidocaine with epinephrine. Ultimately, the efforts were successful. The Surgifoam was left inside and the wound repacked with Dakin's moistened gauze. For additional compression, I elected to wrap her leg with Kerlix and Coban. Mild further evidence of bleeding or oozing was appreciated. I did caution her that the disruption of the clot and the high likelihood of bacterial infection in the wound could potentially result in bacteremia. If she begins having fevers, chills, or other symptoms of systemic infection, she should go to the emergency room to be admitted for IV antibiotics. Electronic Signature(s) Signed: 09/06/2022 3:56:23 PM By: Duanne Guess MD FACS Previous Signature: 09/06/2022 10:28:46 AM Version By: Duanne Guess MD FACS [place], [name] Long [place] ([id number]) [id number] Page 8 of 9 Entered By: Duanne Guess on 09/06/2022 15:56:23 -------------------------------------------------------------------------------- HxROS Details Patient Name: Date of Service: [name] Long, [name] Long 09/06/2022 8:00 A M Medical Record Number: [id number] Patient Account Number: [id number] Date of Birth/Sex: Treating RN: [birth year] (82 y.o. Gevena Mart Primary Care Provider: PA Zenovia Jordan, NO Other Clinician: Referring Provider: Treating Provider/Extender: Sherryl Manges in Treatment: 0 Information Obtained From Patient Cardiovascular Medical History: Positive for:  Arrhythmia - A fib; Congestive Heart Failure; Hypertension Past Medical History Notes: High Cholesterol Endocrine Medical History: Positive for: Type II Diabetes - since 2019 Genitourinary Medical History: Past Medical History Notes: Kidney Disease Immunizations Pneumococcal Vaccine: Received Pneumococcal Vaccination: No Implantable Devices No devices added Family and Social History Hereditary Spherocytosis: No; Hypertension: No; Kidney Disease: No; Lung Disease: No; Seizures: No; Stroke: No; Thyroid Problems: No; Tuberculosis: No; Never smoker; Marital Status - Widowed; Alcohol Use: Never; Drug Use: No History; Caffeine Use: Never; Financial Concerns: No; Food, Clothing or Shelter Needs: No; Support System Lacking: No; Transportation Concerns: No Electronic Signature(s) Signed: 09/06/2022 4:41:42 PM By: Duanne Guess MD FACS Signed: 09/20/2022 7:59:22 AM By: Brenton Grills Entered By: Brenton Grills on 09/06/2022 08:00:37 -------------------------------------------------------------------------------- SuperBill Details Patient Name: Date of Service: [name] Long, [name] Long 09/06/2022 Medical Record Number: [id number] Patient Account Number: [id number] Date of Birth/Sex: Treating RN: 12/04/1940 (82 y.o. 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 Z79.01 Long term (current) use of anticoagulants I50.42 Chronic combined systolic (congestive) and diastolic (congestive) heart failure N18.30 Chronic kidney disease, stage 3 unspecified Facility Procedures : CPT4 Code: [id number] Description: 28413 - WOUND CARE  VISIT-LEV 2 EST PT Modifier: Quantity: 1 : CPT4 Code: [id number] Description: 97597 - DEBRIDE WOUND 1ST 20 SQ CM OR < ICD-10 Diagnosis Description L97.822 Non-pressure chronic ulcer of other part of left lower leg with fat layer exposed Modifier: Quantity: 1 : CPT4 Code: [id number] Description: 97598 - DEBRIDE WOUND EA ADDL 20 SQ CM ICD-10 Diagnosis Description L97.822 Non-pressure chronic ulcer of other part of left lower leg with fat layer exposed Modifier: Quantity: 1 Physician Procedures : CPT4 Code Description Modifier [id number] - WC PHYS LEVEL 4 - NEW PT 25 ICD-10 Diagnosis Description L97.822 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  Z79.01 Long term (current) use of anticoagulants I50.42 Chronic combined systolic (congestive) and diastolic (congestive) heart failure Quantity: 1 : [id number] 97597 - WC PHYS DEBR WO ANESTH 20 SQ CM ICD-10 Diagnosis Description L97.822 Non-pressure chronic ulcer of other part of left lower leg with fat layer exposed Quantity: 1 : [id number] 97598 - WC PHYS DEBR WO ANESTH EA ADD 20 CM ICD-10 Diagnosis Description L97.822 Non-pressure chronic ulcer of other part of left lower leg with fat layer exposed Quantity: 1 Electronic Signature(s) Signed: 09/28/2022 9:01:50 AM By: Pearletha Alfred Signed: 09/28/2022 3:34:47 PM By: Duanne Guess MD FACS Previous Signature: 09/06/2022 10:31:30 AM Version By: Duanne Guess MD FACS Entered By: Pearletha Alfred on 09/28/2022 09:01:50

## 2022-09-08 ENCOUNTER — Encounter (HOSPITAL_BASED_OUTPATIENT_CLINIC_OR_DEPARTMENT_OTHER): Payer: Medicare Other | Admitting: General Surgery

## 2022-09-08 DIAGNOSIS — E11622 Type 2 diabetes mellitus with other skin ulcer: Secondary | ICD-10-CM | POA: Diagnosis not present

## 2022-09-08 NOTE — Progress Notes (Signed)
[place], [place] ([id number]) [id number] Page 1 of 8 Visit Report for 09/08/2022 Chief Complaint Document Details Patient Name: Date of Service: [name] Long, [name] Long 09/08/2022 1:30 PM Medical Record Number: [id number] Patient Account Number: [id number] Date of Birth/Sex: Treating RN: 26-Dec-1940 (82 y.o. F) Primary Care Provider: PA Zenovia Jordan, NO Other Clinician: Referring Provider: Treating Provider/Extender: Sherryl Manges in Treatment: 0 Information Obtained from: Patient Chief Complaint Patient seen for complaints of Non-Healing Wound. Electronic Signature(s) Signed: 09/08/2022 1:51:06 PM By: Duanne Guess MD FACS Entered By: Duanne Guess on 09/08/2022 13:51:06 -------------------------------------------------------------------------------- Debridement Details Patient Name: Date of Service: [name] Long, [name] 09/08/2022 1:30 PM Medical Record Number: [id number] Patient Account Number: [id number] Date of Birth/Sex: Treating RN: [birth year] (82 y.o. F) Primary Care Provider: PA Zenovia Jordan, NO Other Clinician: Referring Provider: Treating Provider/Extender: Sherryl Manges in Treatment: 0 Debridement Performed for Assessment: Wound #1 Left,Lateral Lower Leg Performed By: Physician Duanne Guess, MD Debridement Type: Debridement Severity of Tissue Pre Debridement: Fat layer exposed Level of Consciousness (Pre-procedure): Awake and Alert Pre-procedure Verification/Time Out Yes - 13:21 Taken: Start Time: 13:21 Percent of Wound Bed Debrided: 100% T Area Debrided (cm): otal 35.33 Tissue and other material debrided: Non-Viable, Blood Clots, Fat, Subcutaneous Level: Skin/Subcutaneous Tissue Debridement Description: Excisional Instrument: Curette Bleeding: Moderate Hemostasis Achieved: Gel Foam Response to Treatment: Procedure was tolerated well Level of Consciousness (Post- Awake and Alert procedure): Post Debridement Measurements of Total Wound Length: (cm)  9 Width: (cm) 5 Depth: (cm) 0.9 Volume: (cm) 31.809 Character of Wound/Ulcer Post Debridement: Improved Severity of Tissue Post Debridement: Fat layer exposed Post Procedure Diagnosis Same as [name] Long, [name] Long ([id number]) [phone].pdf Page 2 of 8 Notes scribed for Dr. Lady Gary by Shawn Stall, RN Electronic Signature(s) Signed: 09/08/2022 1:50:58 PM By: Duanne Guess MD FACS Entered By: Duanne Guess on 09/08/2022 13:50:58 -------------------------------------------------------------------------------- HPI Details Patient Name: Date of Service: [name] Long, [name] 09/08/2022 1:30 PM Medical Record Number: [id number] Patient Account Number: [id number] Date of Birth/Sex: Treating RN: [birth year] (82 y.o. F) Primary Care Provider: PA TIENT, NO Other Clinician: Referring Provider: Treating Provider/Extender: Sherryl Manges in Treatment: 0 History of Present Illness HPI Description: ADMISSION 09/06/2022 This is an 82 year old type II diabetic (no A1c to review and patient is unaware of her last value) with a history of atrial fibrillation on chronic anticoagulant therapy, lower extremity edema, congestive heart failure, and stage III kidney disease. She divides her time between her home in [place] and here in [place] where her daughter lives. While in [place], she struck her leg on her bed frame and developed a large hematoma. She was evaluated in the emergency department there and imaging was negative for any deeper injury, such as a fracture. She returned to [facility] and has had an urgent care and emergency department visit to evaluate the site. As there had been no significant improvement in her wound, she was referred to the wound care center for further evaluation and management. I am unable to see any evidence of an attempt to evacuate the hematoma in the electronic medical record.. There are 2 openings in her skin, but they connect to each  other with significant tunneling and undermining. I am unsure if these opened spontaneously or there was a non- documented attempt to drain the hematoma site. The majority of her left lower leg from below the tibial tuberosity to just above the ankle and from the anterior tibia wrapping laterally and posteriorly to nearly the medial aspect of her leg is involved. There is a foul  odor coming from her wound. There is no frank pus but there is significant retained clot that has likely served as culture media. ABI in clinic today was 0.84. 09/08/2022: The patient came in today as an add-on due to bloody drainage that was saturating her dressing. On inspection, it appeared that there was no frank fresh blood and no active site of bleeding identified; it appears that more of the hematoma was liquefied and draining from her wound. Electronic Signature(s) Signed: 09/08/2022 1:52:20 PM By: Duanne Guess MD FACS Entered By: Duanne Guess on 09/08/2022 13:52:20 -------------------------------------------------------------------------------- Physical Exam Details Patient Name: Date of Service: [name] Long, [name] Long 09/08/2022 1:30 PM Medical Record Number: [id number] Patient Account Number: [id number] Date of Birth/Sex: Treating RN: [birth year] (82 y.o. F) Primary Care Provider: PA Zenovia Jordan, NO Other Clinician: Referring Provider: Treating Provider/Extender: Sherryl Manges in Treatment: 0 Constitutional . . . . no acute distress. Respiratory Normal work of breathing on room air. Notes 09/08/2022: On inspection, it appeared that there was no frank fresh blood and no active site of bleeding identified; it appears that more of the hematoma was liquefied and draining from her wound. [place], [place] ([id number]) [id number] Page 3 of 8 Electronic Signature(s) Signed: 09/08/2022 1:53:14 PM By: Duanne Guess MD FACS Entered By: Duanne Guess on 09/08/2022  13:53:14 -------------------------------------------------------------------------------- Physician Orders Details Patient Name: Date of Service: [name] Long, [name] Long 09/08/2022 1:30 PM Medical Record Number: [id number] Patient Account Number: [id number] Date of Birth/Sex: Treating RN: [birth year] (82 y.o. Debara Pickett, [name] Long Primary Care Provider: PA Zenovia Jordan, [place] Other Clinician: Referring Provider: Treating Provider/Extender: Sherryl Manges in Treatment: 0 Verbal / Phone Orders: No Diagnosis Coding ICD-10 Coding Code Description [phone] 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 Z79.01 Long term (current) use of anticoagulants I50.42 Chronic combined systolic (congestive) and diastolic (congestive) heart failure N18.30 Chronic kidney disease, stage 3 unspecified Follow-up Appointments ppointment in 1 week. - Dr. Lady Gary - room 3 Return A Bathing/ Shower/ Hygiene May shower with protection but do not get wound dressing(s) wet. Protect dressing(s) with water repellant cover (for example, large plastic bag) or a cast cover and may then take shower. Negative Presssure Wound Therapy Wound Vac to wound continuously at 174mm/hg pressure Black Foam Edema Control - Lymphedema / SCD / Other Elevate legs to the level of the heart or above for 30 minutes daily and/or when sitting for 3-4 times a day throughout the day. Avoid standing for long periods of time. Home Health Wound #1 Left,Lateral Lower Leg Admit to Home Health for skilled nursing wound care. May utilize formulary equivalent dressing for wound treatment orders unless otherwise specified. Dressing changes to be completed by Home Health on Monday / Wednesday / Friday except when patient has scheduled visit at [facility]. Wound Treatment Wound #1 - Lower Leg Wound Laterality: Left, Lateral Cleanser: Soap and Water 1 x Per Day/30  Days Discharge Instructions: May shower and wash wound with dial antibacterial soap and water prior to dressing change. Cleanser: Vashe 5.8 (oz) 1 x Per Day/30 Days Discharge Instructions: Cleanse the wound with Vashe prior to applying a clean dressing using gauze sponges, not tissue or cotton balls. Cleanser: Dakin's Solution 0.125%, 16 (oz) (Generic) 1 x Per Day/30 Days Secondary Dressing: ABD Pad, 8x10 1 x Per Day/30 Days Discharge Instructions: Apply over primary dressing as directed. Secondary Dressing: Zetuvit Plus 4x8 in 1 x Per Day/30 Days Discharge Instructions:  Apply over primary dressing as directed. Secured With: [employer], 4.5x3.1 (in/yd) 1 x Per Day/30 Days Discharge Instructions: Secure with Kerlix as directed. Compression Wrap: Kerlix Roll 4.5x3.1 (in/yd) 1 x Per Day/30 Days Discharge Instructions: Apply Kerlix and Coban compression as directed. [place], [place] ([id number]) [id number] Page 4 of 8 Compression Wrap: Coban Self-Adherent Wrap 4x5 (in/yd) 1 x Per Day/30 Days Discharge Instructions: Apply over Kerlix as directed. Electronic Signature(s) Signed: 09/08/2022 1:57:36 PM By: Duanne Guess MD FACS Entered By: Duanne Guess on 09/08/2022 13:53:29 -------------------------------------------------------------------------------- Problem List Details Patient Name: Date of Service: [name] Long, [name] Long 09/08/2022 1:30 PM Medical Record Number: [id number] Patient Account Number: [id number] Date of Birth/Sex: Treating RN: January 23, 1941 (82 y.o. F) Primary Care Provider: PA TIENT, NO Other Clinician: Referring Provider: Treating Provider/Extender: Sherryl Manges in Treatment: 0 Active Problems ICD-10 Encounter Code Description Active Date MDM Diagnosis L97.822 Non-pressure chronic ulcer of other part of left lower leg with fat layer exposed7/05/2022 No Yes E11.622 Type 2 diabetes mellitus with other skin ulcer 09/06/2022 No Yes Z79.01  Long term (current) use of anticoagulants 09/06/2022 No Yes I50.42 Chronic combined systolic (congestive) and diastolic (congestive) heart failure 09/06/2022 No Yes N18.30 Chronic kidney disease, stage 3 unspecified 09/06/2022 No Yes Inactive Problems Resolved Problems Electronic Signature(s) Signed: 09/08/2022 1:50:34 PM By: Duanne Guess MD FACS Entered By: Duanne Guess on 09/08/2022 13:50:34 -------------------------------------------------------------------------------- Progress Note Details Patient Name: Date of Service: [name] Long, Aily 09/08/2022 1:30 PM Medical Record Number: [id number] Patient Account Number: [id number] Date of Birth/Sex: Treating RN: [birth year] (82 y.o. Tollie Eth, Andreyah ([id number]) [id number] Page 5 of 8 Primary Care Provider: PA TIENT, NO Other Clinician: Referring Provider: Treating Provider/Extender: Sherryl Manges in Treatment: 0 Subjective Chief Complaint Information obtained from Patient Patient seen for complaints of Non-Healing Wound. History of Present Illness (HPI) ADMISSION 09/06/2022 This is an 82 year old type II diabetic (no A1c to review and patient is unaware of her last value) with a history of atrial fibrillation on chronic anticoagulant therapy, lower extremity edema, congestive heart failure, and stage III kidney disease. She divides her time between her home in [place]. [place] and here in [place] where her daughter lives. While in [place], she struck her leg on her bed frame and developed a large hematoma. She was evaluated in the emergency department there and imaging was negative for any deeper injury, such as a fracture. She returned to [facility] and has had an urgent care and emergency department visit to evaluate the site. As there had been no significant improvement in her wound, she was referred to the wound care center for further evaluation and management. I am unable to see any evidence of an attempt  to evacuate the hematoma in the electronic medical record.. There are 2 openings in her skin, but they connect to each other with significant tunneling and undermining. I am unsure if these opened spontaneously or there was a non- documented attempt to drain the hematoma site. The majority of her left lower leg from below the tibial tuberosity to just above the ankle and from the anterior tibia wrapping laterally and posteriorly to nearly the medial aspect of her leg is involved. There is a foul odor coming from her wound. There is no frank pus but there is significant retained clot that has likely served as culture media. ABI in clinic today was 0.84. 09/08/2022: The patient came in today as an add-on due to bloody drainage that was saturating her dressing. On inspection, it appeared that there  was no frank fresh blood and no active site of bleeding identified; it appears that more of the hematoma was liquefied and draining from her wound. Patient History Information obtained from Patient. Family History No family history of Hereditary Spherocytosis, Hypertension, Kidney Disease, Lung Disease, Seizures, Stroke, Thyroid Problems, Tuberculosis. Social History Never smoker, Marital Status - Widowed, Alcohol Use - Never, Drug Use - No History, Caffeine Use - Never. Medical History Cardiovascular Patient has history of Arrhythmia - A fib, Congestive Heart Failure, Hypertension Endocrine Patient has history of Type II Diabetes - since 2019 Medical A Surgical History Notes nd Cardiovascular High Cholesterol Genitourinary Kidney Disease Objective Constitutional no acute distress. Vitals Time Taken: 1:13 AM, Height: 64 in, Weight: 162 lbs, BMI: 27.8, Temperature: 98.3 F, Pulse: 78 bpm, Respiratory Rate: 18 breaths/min, Blood Pressure: 117/66 mmHg, Capillary Blood Glucose: 129 mg/dl. Respiratory Normal work of breathing on room air. General Notes: 09/08/2022: On inspection, it appeared that there  was no frank fresh blood and no active site of bleeding identified; it appears that more of the hematoma was liquefied and draining from her wound. Integumentary (Hair, Skin) Wound #1 status is Open. Original cause of wound was Trauma. The date acquired was: 08/05/2022. The wound is located on the Left,Lateral Lower Leg. The wound measures 9cm length x 5cm width x 0.9cm depth; 35.343cm^2 area and 31.809cm^3 volume. There is Fat Layer (Subcutaneous Tissue) exposed. There is a medium amount of serosanguineous drainage noted. Foul odor after cleansing was noted. The wound margin is distinct with the outline attached to the wound base. There is medium (34-66%) red granulation within the wound bed. There is a medium (34-66%) amount of necrotic tissue within the wound bed including Eschar. The periwound skin appearance exhibited: Rash, Hemosiderin Staining. The periwound skin appearance did not exhibit: Dry/Scaly, Maceration. Periwound temperature was noted as No Abnormality. The periwound has tenderness on palpation. Assessment DONNISE, ELMI ([id number]) [id number] Page 6 of 8 Active Problems ICD-10 Non-pressure chronic ulcer of other part of left lower leg with fat layer exposed Type 2 diabetes mellitus with other skin ulcer Long term (current) use of anticoagulants Chronic combined systolic (congestive) and diastolic (congestive) heart failure Chronic kidney disease, stage 3 unspecified Procedures Wound #1 Pre-procedure diagnosis of Wound #1 is a Diabetic Wound/Ulcer of the Lower Extremity located on the Left,Lateral Lower Leg .Severity of Tissue Pre Debridement is: Fat layer exposed. There was a Excisional Skin/Subcutaneous Tissue Debridement with a total area of 35.33 sq cm performed by Duanne Guess, MD. With the following instrument(s): Curette to remove Non-Viable tissue/material. Material removed includes Blood Clots, Fat, and Subcutaneous Tissue. No specimens  were taken. A time out was conducted at 13:21, prior to the start of the procedure. A Moderate amount of bleeding was controlled with Gel Foam. The procedure was tolerated well. Post Debridement Measurements: 9cm length x 5cm width x 0.9cm depth; 31.809cm^3 volume. Character of Wound/Ulcer Post Debridement is improved. Severity of Tissue Post Debridement is: Fat layer exposed. Post procedure Diagnosis Wound #1: Same as Pre-Procedure General Notes: scribed for Dr. Lady Gary by Shawn Stall, RN. Plan Follow-up Appointments: Return Appointment in 1 week. - Dr. Lady Gary - room 3 Bathing/ Shower/ Hygiene: May shower with protection but do not get wound dressing(s) wet. Protect dressing(s) with water repellant cover (for example, large plastic bag) or a cast cover and may then take shower. Negative Presssure Wound Therapy: Wound Vac to wound continuously at 159mm/hg pressure Black Foam Edema Control - Lymphedema / SCD / Other: Elevate  legs to the level of the heart or above for 30 minutes daily and/or when sitting for 3-4 times a day throughout the day. Avoid standing for long periods of time. Home Health: Wound #1 Left,Lateral Lower Leg: Admit to Home Health for skilled nursing wound care. May utilize formulary equivalent dressing for wound treatment orders unless otherwise specified. Dressing changes to be completed by Home Health on Monday / Wednesday / Friday except when patient has scheduled visit at [facility]. WOUND #1: - Lower Leg Wound Laterality: Left, Lateral Cleanser: Soap and Water 1 x Per Day/30 Days Discharge Instructions: May shower and wash wound with dial antibacterial soap and water prior to dressing change. Cleanser: Vashe 5.8 (oz) 1 x Per Day/30 Days Discharge Instructions: Cleanse the wound with Vashe prior to applying a clean dressing using gauze sponges, not tissue or cotton balls. Cleanser: Dakin's Solution 0.125%, 16 (oz) (Generic) 1 x Per Day/30 Days Secondary  Dressing: ABD Pad, 8x10 1 x Per Day/30 Days Discharge Instructions: Apply over primary dressing as directed. Secondary Dressing: Zetuvit Plus 4x8 in 1 x Per Day/30 Days Discharge Instructions: Apply over primary dressing as directed. Secured With: [employer], 4.5x3.1 (in/yd) 1 x Per Day/30 Days Discharge Instructions: Secure with Kerlix as directed. Com pression Wrap: Kerlix Roll 4.5x3.1 (in/yd) 1 x Per Day/30 Days Discharge Instructions: Apply Kerlix and Coban compression as directed. Com pression Wrap: Coban Self-Adherent Wrap 4x5 (in/yd) 1 x Per Day/30 Days Discharge Instructions: Apply over Kerlix as directed. 09/08/2022: The patient came in today as an add-on due to bloody drainage that was saturating her dressing. On inspection, it appeared that there was no frank fresh blood and no active site of bleeding identified; it appears that more of the hematoma was liquefied and draining from her wound. I used a large scoop curette to debride more of the hematoma. I think I got the majority of it out today. I also debrided necrotic fat and subcutaneous tissue. There is still a strong foul odor coming from the wound. She has started taking the Bactrim and levofloxacin that I prescribed. We will continue to pack the wound with Dakin's moistened Kerlix and use Kerlix and Coban wrap as a source of compression. She will have a nurse visit on Monday to reevaluate the area; if there is still bleeding present we will convert this to a doctor visit. Otherwise I plan to see her a week from today. Electronic Signature(s) Signed: 09/08/2022 1:54:40 PM By: Duanne Guess MD FACS Entered By: Duanne Guess on 09/08/2022 13:54:40 [place], [place] ([id number]) [id number] Page 7 of 8 -------------------------------------------------------------------------------- HxROS Details Patient Name: Date of Service: AZARAH, SUTTLE 09/08/2022 1:30 PM Medical Record Number:  [id number] Patient Account Number: [id number] Date of Birth/Sex: Treating RN: 1940-09-11 (82 y.o. F) Primary Care Provider: PA Zenovia Jordan, NO Other Clinician: Referring Provider: Treating Provider/Extender: Sherryl Manges in Treatment: 0 Information Obtained From Patient Cardiovascular Medical History: Positive for: Arrhythmia - A fib; Congestive Heart Failure; Hypertension Past Medical History Notes: High Cholesterol Endocrine Medical History: Positive for: Type II Diabetes - since 2019 Genitourinary Medical History: Past Medical History Notes: Kidney Disease Immunizations Pneumococcal Vaccine: Received Pneumococcal Vaccination: No Implantable Devices No devices added Family and Social History Hereditary Spherocytosis: No; Hypertension: No; Kidney Disease: No; Lung Disease: No; Seizures: No; Stroke: No; Thyroid Problems: No; Tuberculosis: No; Never smoker; Marital Status - Widowed; Alcohol Use: Never; Drug Use: No History; Caffeine Use: Never; Financial Concerns: No; Food, Clothing or Shelter Needs: No; Support System  Lacking: No; Transportation Concerns: No Electronic Signature(s) Signed: 09/08/2022 1:57:36 PM By: Duanne Guess MD FACS Entered By: Duanne Guess on 09/08/2022 13:52:34 -------------------------------------------------------------------------------- SuperBill Details Patient Name: Date of Service: [name] Long, [place] 09/08/2022 Medical Record Number: [id number] Patient Account Number: [id number] Date of Birth/Sex: Treating RN: Jan 09, 1941 (82 y.o. F) Primary Care Provider: PA Zenovia Jordan, NO Other Clinician: Referring Provider: Treating Provider/Extender: Sherryl Manges in Treatment: 0 Diagnosis Coding ICD-10 Codes Code Description TANGULA, HETMAN ([id number]) [id number] Page 8 of 8 [phone] Non-pressure chronic ulcer of other part of left lower leg with fat layer exposed E11.622 Type 2 diabetes mellitus with other skin  ulcer Z79.01 Long term (current) use of anticoagulants I50.42 Chronic combined systolic (congestive) and diastolic (congestive) heart failure N18.30 Chronic kidney disease, stage 3 unspecified Facility Procedures : CPT4 Code: [id number] Description: 11042 - DEB SUBQ TISSUE 20 SQ CM/< ICD-10 Diagnosis Description L97.822 Non-pressure chronic ulcer of other part of left lower leg with fat layer expo Modifier: sed Quantity: 1 : CPT4 Code: [id number] Description: 11045 - DEB SUBQ TISS EA ADDL 20CM ICD-10 Diagnosis Description L97.822 Non-pressure chronic ulcer of other part of left lower leg with fat layer expo Modifier: sed Quantity: 1 Physician Procedures : CPT4 Code Description Modifier [id number] 99214 - WC PHYS LEVEL 4 - EST PT 25 ICD-10 Diagnosis Description L97.822 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  Z79.01 Long term (current) use of anticoagulants I50.42 Chronic combined systolic (congestive) and diastolic (congestive) heart failure Quantity: 1 : [phone] 11042 - WC PHYS SUBQ TISS 20 SQ CM ICD-10 Diagnosis Description L97.822 Non-pressure chronic ulcer of other part of left lower leg with fat layer exposed Quantity: 1 : [phone] 11045 - WC PHYS SUBQ TISS EA ADDL 20 CM ICD-10 Diagnosis Description L97.822 Non-pressure chronic ulcer of other part of left lower leg with fat layer exposed Quantity: 1 Electronic Signature(s) Signed: 09/08/2022 1:54:59 PM By: Duanne Guess MD FACS Entered By: Duanne Guess on 09/08/2022 13:54:58

## 2022-09-08 NOTE — Progress Notes (Signed)
[place], [place] ([id number]) [id number] Page 1 of 7 Visit Report for 09/08/2022 Arrival Information Details Patient Name: Date of Service: [name] Long, [name] Long 09/08/2022 1:30 PM Medical Record Number: [id number] Patient Account Number: [id number] Date of Birth/Sex: Treating RN: 1940/05/21 (82 y.o. [name] Long, [name].Long Primary Care Kimaya Whitlatch: PA Zenovia Jordan, NO Other Clinician: Referring Brina Umeda: Treating Shauna Bodkins/Extender: Sherryl Manges in Treatment: 0 Visit Information History Since Last Visit Added or deleted any medications: No Patient Arrived: Walker Any new allergies or adverse reactions: No Arrival Time: 13:12 Had a fall or experienced change in No Accompanied By: daughter activities of daily living that may affect Transfer Assistance: None risk of falls: Patient Identification Verified: Yes Signs or symptoms of abuse/neglect since last visito No Secondary Verification Process Completed: Yes Hospitalized since last visit: No Patient Requires Transmission-Based Precautions: No Implantable device outside of the clinic excluding No Patient Has Alerts: No cellular tissue based products placed in the center since last visit: Has Dressing in Place as Prescribed: Yes Has Compression in Place as Prescribed: Yes Pain Present Now: No Electronic Signature(s) Signed: 09/08/2022 3:32:15 PM By: Shawn Stall RN, BSN Entered By: Shawn Stall on 09/08/2022 13:13:40 -------------------------------------------------------------------------------- Encounter Discharge Information Details Patient Name: Date of Service: [name] Long, [name] Long 09/08/2022 1:30 PM Medical Record Number: [id number] Patient Account Number: [id number] Date of Birth/Sex: Treating RN: December 25, 1940 (82 y.o. [name] Long Primary Care Coyt Govoni: PA Zenovia Jordan, [place] Other Clinician: Referring Burney Calzadilla: Treating Zac Torti/Extender: Sherryl Manges in Treatment: 0 Encounter Discharge Information Items Post  Procedure Vitals Discharge Condition: Stable Temperature (F): 98.3 Ambulatory Status: Walker Pulse (bpm): 78 Discharge Destination: Home Respiratory Rate (breaths/min): 18 Transportation: Private Auto Blood Pressure (mmHg): 117/66 Accompanied By: daughter Schedule Follow-up Appointment: Yes Clinical Summary of Care: Patient Declined Electronic Signature(s) Signed: 09/08/2022 3:40:23 PM By: Samuella Bruin Entered By: Samuella Bruin on 09/08/2022 13:45:33 [name] Long, [name] Long ([id number]) [id number] Page 2 of 7 -------------------------------------------------------------------------------- Lower Extremity Assessment Details Patient Name: Date of Service: [name] Long, [name] Long 09/08/2022 1:30 PM Medical Record Number: [id number] Patient Account Number: [id number] Date of Birth/Sex: Treating RN: 06-Aug-1940 (82 y.o. [name] Long Primary Care Oza Oberle: PA Zenovia Jordan, [place] Other Clinician: Referring Madden Piazza: Treating Jaydin Boniface/Extender: Sherryl Manges in Treatment: 0 Edema Assessment Assessed: [Left: No] [Right: No] [Left: Edema] [Right: :] Calf Left: Right: Point of Measurement: From Medial Instep 44.5 cm 35.3 cm Ankle Left: Right: Point of Measurement: From Medial Instep 33 cm 26 cm Electronic Signature(s) Signed: 09/08/2022 3:32:15 PM By: Shawn Stall RN, BSN Entered By: Shawn Stall on 09/08/2022 13:15:50 -------------------------------------------------------------------------------- Multi Wound Chart Details Patient Name: Date of Service: [name] Long, [name] Long 09/08/2022 1:30 PM Medical Record Number: [id number] Patient Account Number: [id number] Date of Birth/Sex: Treating RN: 1940-12-23 (82 y.o. 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 Arrhythmia, Congestive Heart Failure, Comorbid History: Hypertension, Type II Diabetes 08/05/2022 Date Acquired: 0 Weeks  of Treatment: Open Wound Status: No Wound Recurrence: 9x5x0.9 Measurements L x W x D (cm) 35.343 A (cm) : rea 31.809 Volume (cm) : 0.00% % Reduction in Area: 0.00% % Reduction in Volume:] [N/A:N/A N/A N/A N/A N/A N/A N/A N/A N/A N/A N/A N/A N/A N/A] ESTIE, HEGDE ([id number]) [1:Grade 2 Classification: Medium Exudate  A mount: Serosanguineous Exudate Type: red, brown Exudate Color: Yes Foul Odor A Cleansing: fter No Odor A nticipated Due to Product Use: Distinct, outline attached Wound Margin: Medium (34-66%) Granulation A  mount: Red Granulation Quality: Medium (34-66%) Necrotic A mount: Eschar Necrotic Tissue: Fat Layer (Subcutaneous Tissue): Yes N/A Exposed Structures: Debridement - Excisional Debridement: 13:21 Pre-procedure Verification/Time Out Taken: Fat, Blood  Clots, Subcutaneous Tissue Debrided: Skin/Subcutaneous Tissue Level: 35.33 Debridement A (sq cm): rea Curette Instrument: Moderate Bleeding: Gel Foam Hemostasis A chieved: Procedure was tolerated well Debridement Treatment Response: 9x5x0.9 Post  Debridement Measurements L x W x D (cm) 31.809 Post Debridement Volume: (cm) Rash: Yes Periwound Skin Texture: Maceration: No Periwound Skin Moisture: Dry/Scaly: No Hemosiderin Staining: Yes Periwound Skin Color: No Abnormality Temperature: Yes  Tenderness on Palpation: Debridement Procedures Performed:] [N/A:N/A N/A N/A N/A N/A N/A N/A N/A N/A N/A N/A N/A N/A N/A N/A N/A N/A N/A N/A N/A N/A N/A N/A N/A N/A N/A N/A N/A] Treatment Notes Wound #1 (Lower Leg) Wound Laterality: Left, Lateral Cleanser Soap and Water Discharge Instruction: May shower  and wash wound with dial antibacterial soap and water prior to dressing change. Vashe 5.8 (oz) Discharge Instruction: Cleanse the wound with Vashe prior to applying a clean dressing using gauze sponges, not tissue or cotton balls. Dakin's Solution 0.125%, 16 (oz) Peri-Wound Care Topical Primary Dressing Secondary Dressing ABD Pad, 8x10 Discharge Instruction: Apply over primary dressing as directed. Zetuvit Plus 4x8 in Discharge Instruction: Apply over primary dressing as directed. Secured With [employer], 4.5x3.1 (in/yd) Discharge Instruction: Secure with Kerlix as directed. Compression Wrap Kerlix Roll 4.5x3.1 (in/yd) Discharge Instruction: Apply Kerlix and Coban compression as directed. Coban Self-Adherent Wrap 4x5 (in/yd) Discharge Instruction: Apply over Kerlix as directed. Compression Stockings Add-Ons Electronic Signature(s) Signed: 09/08/2022 1:50:41 PM By: Duanne Guess MD FACS Entered By: Duanne Guess on 09/08/2022 13:50:41 [place], [name] ([id number]) [id number] Page 4 of 7 -------------------------------------------------------------------------------- Multi-Disciplinary Care Plan Details Patient Name: Date of Service: [name] Long, [name] Long 09/08/2022 1:30 PM Medical Record Number: [id number] Patient Account Number: [id number] Date of Birth/Sex: Treating RN: 08-Aug-1940 (82 y.o. [name] Long Primary Care Aadi Bordner: PA Zenovia Jordan, [place] Other Clinician: Referring Shalicia Craghead: Treating Amberle Lyter/Extender: Sherryl Manges in Treatment: 0 Active Inactive Wound/Skin Impairment Nursing Diagnoses: Impaired tissue integrity Goals: Patient/caregiver will verbalize understanding of skin care regimen Date Initiated: 09/06/2022 Target Resolution Date: 11/04/2022 Goal Status: Active Interventions: Assess patient/caregiver ability to obtain necessary supplies Assess patient/caregiver ability to perform ulcer/skin care regimen upon admission and as  needed Assess ulceration(s) every visit Provide education on ulcer and skin care Screen for HBO Treatment Activities: Skin care regimen initiated : 09/06/2022 Topical wound management initiated : 09/06/2022 Notes: Electronic Signature(s) Signed: 09/08/2022 3:32:15 PM By: Shawn Stall RN, BSN Entered By: Shawn Stall on 09/08/2022 13:26:02 -------------------------------------------------------------------------------- Pain Assessment Details Patient Name: Date of Service: [name] Long, Nailea 09/08/2022 1:30 PM Medical Record Number: [id number] Patient Account Number: [id number] Date of Birth/Sex: Treating RN: 04-Feb-1941 (82 y.o. [name] Long Primary Care Judye Lorino: PA Zenovia Jordan, [place] Other Clinician: Referring Jamileth Putzier: Treating Evetta Renner/Extender: Sherryl Manges in Treatment: 0 Active Problems Location of Pain Severity and Description of Pain Patient Has Paino No Site Locations [place], [place] ([id number]) [id number] Page 5 of 7 Pain Management and Medication Current Pain Management: Electronic Signature(s) Signed: 09/08/2022 3:32:15 PM By: Shawn Stall RN, BSN Entered By: Shawn Stall on 09/08/2022 13:14:41 -------------------------------------------------------------------------------- Patient/Caregiver Education Details Patient Name: Date of Service: Dorie Rank 7/5/2024andnbsp1:30 PM Medical Record Number: [id number] Patient Account Number:  [id number] Date of Birth/Gender: Treating RN: 1940-05-10 (82 y.o. [name] Long Primary Care Physician: PA Zenovia Jordan, [place] Other Clinician: Referring Physician: Treating Physician/Extender: Sherryl Manges in Treatment: 0 Education Assessment Education Provided To: Patient and Caregiver Education Topics Provided Wound/Skin Impairment: Methods: Explain/Verbal Responses: Reinforcements needed, State content correctly Electronic Signature(s) Signed: 09/08/2022 3:32:15 PM By: Shawn Stall RN, BSN Entered By:  Shawn Stall on 09/08/2022 13:26:14 -------------------------------------------------------------------------------- Wound Assessment Details Patient Name: Date of Service: [name] Long, [name] Long 09/08/2022 1:30 PM Medical Record Number: [id number] Patient Account Number: [id number] Date of Birth/Sex: Treating RN: [birth year] (81 y.o. [name] Long Primary Care Isyss Espinal: PA Zenovia Jordan, [place] Other Clinician: Referring Kastiel Simonian: Treating Hina Gupta/Extender: Adeeba, [place], [name] ([id number]) [id number] Page 6 of 7 Weeks in Treatment: 0 Wound Status Wound Number: 1 Primary Diabetic Wound/Ulcer of the Lower Extremity Etiology: Wound Location: Left, Lateral Lower Leg Wound Status: Open Wounding Event: Trauma Comorbid Arrhythmia, Congestive Heart Failure, Hypertension, Type II Date Acquired: 08/05/2022 History: Diabetes Weeks Of Treatment: 0 Clustered Wound: No Wound Measurements Length: (cm) 9 Width: (cm) 5 Depth: (cm) 0.9 Area: (cm) 35.343 Volume: (cm) 31.809 % Reduction in Area: 0% % Reduction in Volume: 0% Wound Description Classification: Grade 2 Wound Margin: Distinct, outline attached Exudate Amount: Medium Exudate Type: Serosanguineous Exudate Color: red, brown Foul Odor After Cleansing: Yes Due to Product Use: No Slough/Fibrino No Wound Bed Granulation Amount: Medium (34-66%) Exposed Structure Granulation Quality: Red Fat Layer (Subcutaneous Tissue) Exposed: Yes Necrotic Amount: Medium (34-66%) Necrotic Quality: Eschar Periwound Skin Texture Texture Color No Abnormalities Noted: No No Abnormalities Noted: No Rash: Yes Hemosiderin Staining: Yes Moisture Temperature / Pain No Abnormalities Noted: No Temperature: No Abnormality Dry / Scaly: No Tenderness on Palpation: Yes Maceration: No Treatment Notes Wound #1 (Lower Leg) Wound Laterality: Left, Lateral Cleanser Soap and Water Discharge Instruction: May shower and wash wound with dial  antibacterial soap and water prior to dressing change. Vashe 5.8 (oz) Discharge Instruction: Cleanse the wound with Vashe prior to applying a clean dressing using gauze sponges, not tissue or cotton balls. Dakin's Solution 0.125%, 16 (oz) Peri-Wound Care Topical Primary Dressing Secondary Dressing ABD Pad, 8x10 Discharge Instruction: Apply over primary dressing as directed. Zetuvit Plus 4x8 in Discharge Instruction: Apply over primary dressing as directed. Secured With [employer], 4.5x3.1 (in/yd) Discharge Instruction: Secure with Kerlix as directed. Compression Wrap Kerlix Roll 4.5x3.1 (in/yd) Discharge Instruction: Apply Kerlix and Coban compression as directed. Coban Self-Adherent Wrap 4x5 (in/yd) Discharge Instruction: Apply over Kerlix as directed. Compression Stockings [place], [place] ([id number]) [id number] Page 7 of 7 Add-Ons Electronic Signature(s) Signed: 09/08/2022 3:32:15 PM By: Shawn Stall RN, BSN Entered By: Shawn Stall on 09/08/2022 13:16:00 -------------------------------------------------------------------------------- Vitals Details Patient Name: Date of Service: [name] Long, [name] Long 09/08/2022 1:30 PM Medical Record Number: [id number] Patient Account Number: [id number] Date of Birth/Sex: Treating RN: 14-Feb-1941 (82 y.o. [name] Long Primary Care Leyna Vanderkolk: PA TIENT, NO Other Clinician: Referring Aaleah Hirsch: Treating Konstantinos Cordoba/Extender: Sherryl Manges in Treatment: 0 Vital Signs Time Taken: 01:13 Temperature (F): 98.3 Height (in): 64 Pulse (bpm): 78 Weight (lbs): 162 Respiratory Rate (breaths/min): 18 Body Mass Index (BMI): 27.8 Blood Pressure (mmHg): 117/66 Capillary Blood Glucose (mg/dl): 244 Reference Range: 80 - 120 mg / dl Electronic Signature(s) Signed: 09/08/2022 3:32:15 PM By: Shawn Stall RN, BSN Entered By: Shawn Stall on 09/08/2022 13:14:35

## 2022-09-11 ENCOUNTER — Encounter (HOSPITAL_BASED_OUTPATIENT_CLINIC_OR_DEPARTMENT_OTHER): Payer: Medicare Other | Admitting: General Surgery

## 2022-09-11 ENCOUNTER — Encounter (HOSPITAL_BASED_OUTPATIENT_CLINIC_OR_DEPARTMENT_OTHER): Payer: Medicare Other | Admitting: Internal Medicine

## 2022-09-11 DIAGNOSIS — E11622 Type 2 diabetes mellitus with other skin ulcer: Secondary | ICD-10-CM | POA: Diagnosis not present

## 2022-09-12 ENCOUNTER — Telehealth: Payer: Self-pay | Admitting: Cardiovascular Disease

## 2022-09-12 NOTE — Telephone Encounter (Signed)
[name] Bollman, MD 09/10/2022  7:45 AM EDT     Labs in acceptable range   Called and spoke with daughter on DPR and all questions answered.

## 2022-09-12 NOTE — Progress Notes (Signed)
[place], [place] ([id number]) [id number] Page 1 of 5 Visit Report for 09/11/2022 Arrival Information Details Patient Name: Date of Service: [name] Long, [name] Long 09/11/2022 2:45 PM Medical Record Number: [id number] Patient Account Number: [id number] Date of Birth/Sex: Treating RN: 1940/06/28 (82 y.o. [name] Long, [name] Primary Care [name] Long: PA Zenovia Jordan, NO Other Clinician: Referring [name] Long: Treating [name] Long/Extender: Sherryl Manges in Treatment: 0 Visit Information History Since Last Visit Added or deleted any medications: No Patient Arrived: Walker Any new allergies or adverse reactions: No Arrival Time: 14:50 Had a fall or experienced change in No Accompanied By: daughter activities of daily living that may affect Transfer Assistance: None risk of falls: Patient Identification Verified: Yes Signs or symptoms of abuse/neglect since last visito No Secondary Verification Process Completed: Yes Hospitalized since last visit: No Patient Requires Transmission-Based Precautions: No Implantable device outside of the clinic excluding No Patient Has Alerts: No cellular tissue based products placed in the center since last visit: Has Dressing in Place as Prescribed: Yes Has Compression in Place as Prescribed: Yes Pain Present Now: No Electronic Signature(s) Signed: 09/11/2022 5:51:31 PM By: [name] RN, BSN Entered By: [name] on 09/11/2022 14:50:42 -------------------------------------------------------------------------------- Clinic Level of Care Assessment Details Patient Name: Date of Service: [name] Long, [name] Long 09/11/2022 2:45 PM Medical Record Number: [id number] Patient Account Number: [id number] Date of Birth/Sex: Treating RN: 27-Jan-1941 (82 y.o. [name] Long Primary Care [name] Long: PA Zenovia Jordan, [place] Other Clinician: Referring [name] Long: Treating [name] Long/Extender: Sherryl Manges in Treatment: 0 Clinic Level of Care Assessment Items TOOL 4  Quantity Score []  - 0 Use when only an EandM is performed on FOLLOW-UP visit ASSESSMENTS - Nursing Assessment / Reassessment X- 1 10 Reassessment of Co-morbidities (includes updates in patient status) X- 1 5 Reassessment of Adherence to Treatment Plan ASSESSMENTS - Wound and Skin A ssessment / Reassessment X - Simple Wound Assessment / Reassessment - one wound 1 5 []  - 0 Complex Wound Assessment / Reassessment - multiple wounds []  - 0 Dermatologic / Skin Assessment (not related to wound area) ASSESSMENTS - Focused Assessment []  - 0 Circumferential Edema Measurements - multi extremities []  - 0 Nutritional Assessment / Counseling / Intervention [place], [name] ([id number]) [id number] Page 2 of 5 []  - 0 Lower Extremity Assessment (monofilament, tuning fork, pulses) []  - 0 Peripheral Arterial Disease Assessment (using hand held doppler) ASSESSMENTS - Ostomy and/or Continence Assessment and Care []  - 0 Incontinence Assessment and Management []  - 0 Ostomy Care Assessment and Management (repouching, etc.) PROCESS - Coordination of Care X - Simple Patient / Family Education for ongoing care 1 15 []  - 0 Complex (extensive) Patient / Family Education for ongoing care X- 1 10 Staff obtains Chiropractor, Records, T Results / Process Orders est []  - 0 Staff telephones HHA, Nursing Homes / Clarify orders / etc []  - 0 Routine Transfer to another Facility (non-emergent condition) []  - 0 Routine Hospital Admission (non-emergent condition) []  - 0 New Admissions / Manufacturing engineer / Ordering NPWT Apligraf, etc. , []  - 0 Emergency Hospital Admission (emergent condition) X- 1 10 Simple Discharge Coordination []  - 0 Complex (extensive) Discharge Coordination PROCESS - Special Needs []  - 0 Pediatric / Minor Patient Management []  - 0 Isolation Patient Management []  - 0 Hearing / Language / Visual special needs []  - 0 Assessment of Community assistance  (transportation, D/C planning, etc.) []  - 0 Additional assistance / Altered mentation []  - 0 Support Surface(s) Assessment (bed, cushion, seat, etc.) INTERVENTIONS - Wound Cleansing / Measurement X - Simple Wound  Cleansing - one wound 1 5 []  - 0 Complex Wound Cleansing - multiple wounds []  - 0 Wound Imaging (photographs - any number of wounds) []  - 0 Wound Tracing (instead of photographs) []  - 0 Simple Wound Measurement - one wound []  - 0 Complex Wound Measurement - multiple wounds INTERVENTIONS - Wound Dressings []  - 0 Small Wound Dressing one or multiple wounds []  - 0 Medium Wound Dressing one or multiple wounds X- 1 20 Large Wound Dressing one or multiple wounds []  - 0 Application of Medications - topical []  - 0 Application of Medications - injection INTERVENTIONS - Miscellaneous []  - 0 External ear exam []  - 0 Specimen Collection (cultures, biopsies, blood, body fluids, etc.) []  - 0 Specimen(s) / Culture(s) sent or taken to Lab for analysis []  - 0 Patient Transfer (multiple staff / Nurse, adult / Similar devices) []  - 0 Simple Staple / Suture removal (25 or less) []  - 0 Complex Staple / Suture removal (26 or more) []  - 0 Hypo / Hyperglycemic Management (close monitor of Blood Glucose) [name] Long ([id number]) [id number] Page 3 of 5 []  - 0 Ankle / Brachial Index (ABI) - do not check if billed separately X- 1 5 Vital Signs Has the patient been seen at the hospital within the last three years: Yes Total Score: 85 Level Of Care: New/Established - Level 3 Electronic Signature(s) Signed: 09/11/2022 5:51:31 PM By: [name] RN, BSN Entered By: [name] on 09/11/2022 15:22:35 -------------------------------------------------------------------------------- Encounter Discharge Information Details Patient Name: Date of Service: [name] Long, [name] Long 09/11/2022 2:45 PM Medical Record Number: [id number] Patient Account Number: [id number] Date  of Birth/Sex: Treating RN: 03-Apr-1940 (82 y.o. [name] Long Primary Care [name] Long: PA Zenovia Jordan, [place] Other Clinician: Referring Fouad Taul: Treating Dariann Huckaba/Extender: Sherryl Manges in Treatment: 0 Encounter Discharge Information Items Discharge Condition: Stable Ambulatory Status: Walker Discharge Destination: Home Transportation: Private Auto Accompanied By: daughter Schedule Follow-up Appointment: Yes Clinical Summary of Care: Patient Declined Electronic Signature(s) Signed: 09/11/2022 5:51:31 PM By: [name] RN, BSN Entered By: [name] on 09/11/2022 15:21:50 -------------------------------------------------------------------------------- Patient/Caregiver Education Details Patient Name: Date of Service: [name] Long, [name] Long 7/8/2024andnbsp2:45 PM Medical Record Number: [id number] Patient Account Number: [id number] Date of Birth/Gender: Treating RN: 1940/10/06 (82 y.o. [name] Long Primary Care Physician: PA Zenovia Jordan, [place] Other Clinician: Referring Physician: Treating Physician/Extender: Sherryl Manges in Treatment: 0 Education Assessment Education Provided To: Patient Education Topics Provided Wound/Skin Impairment: Methods: Explain/Verbal Responses: Reinforcements needed, State content correctly Electronic Signature(s) Signed: 09/11/2022 5:51:31 PM By: [name] RN, BSN [name] Long, [name] Long ([id number]) [id number] Page 4 of 5 Entered By: [name] on 09/11/2022 15:21:30 -------------------------------------------------------------------------------- Wound Assessment Details Patient Name: Date of Service: [name] Long, [name] Long 09/11/2022 2:45 PM Medical Record Number: [id number] Patient Account Number: [id number] Date of Birth/Sex: Treating RN: September 19, 1940 (82 y.o. [name] Long Primary Care Edye Hainline: PA Zenovia Jordan, [place] Other Clinician: Referring Caileigh Canche: Treating Nour Rodrigues/Extender: Sherryl Manges in Treatment: 0 Wound  Status Wound Number: 1 Primary Diabetic Wound/Ulcer of the Lower Extremity Etiology: Wound Location: Left, Lateral Lower Leg Wound Status: Open Wounding Event: Trauma Comorbid Arrhythmia, Congestive Heart Failure, Hypertension, Type II Date Acquired: 08/05/2022 History: Diabetes Weeks Of Treatment: 0 Clustered Wound: No Wound Measurements Length: (cm) 9 Width: (cm) 5 Depth: (cm) 0.9 Area: (cm) 35.343 Volume: (cm) 31.809 % Reduction in Area: 0% % Reduction in Volume: 0% Wound Description Classification: Grade 2 Wound Margin: Distinct, outline attached Exudate Amount: Large Exudate Type: Sanguinous Exudate Color: red Foul Odor After Cleansing: Yes Due to  Product Use: No Slough/Fibrino No Wound Bed Granulation Amount: Large (67-100%) Exposed Structure Granulation Quality: Red Fat Layer (Subcutaneous Tissue) Exposed: Yes Necrotic Amount: Small (1-33%) Necrotic Quality: Adherent Slough Periwound Skin Texture Texture Color No Abnormalities Noted: Yes No Abnormalities Noted: No Hemosiderin Staining: Yes Moisture No Abnormalities Noted: Yes Temperature / Pain Temperature: No Abnormality Tenderness on Palpation: Yes Treatment Notes Wound #1 (Lower Leg) Wound Laterality: Left, Lateral Cleanser Soap and Water Discharge Instruction: May shower and wash wound with dial antibacterial soap and water prior to dressing change. Vashe 5.8 (oz) Discharge Instruction: Cleanse the wound with Vashe prior to applying a clean dressing using gauze sponges, not tissue or cotton balls. Peri-Wound Care Zinc Oxide Ointment 30g tube Discharge Instruction: Apply Zinc Oxide to periwound with each dressing change Sween Lotion (Moisturizing lotion) Discharge Instruction: Apply moisturizing lotion as directed Topical [place], [place] ([id number]) [id number] Page 5 of 5 Primary Dressing Dakin's Solution 0.25%, 16 (oz) Discharge Instruction: Moisten gauze with Dakin's  solution Secondary Dressing ABD Pad, 8x10 Discharge Instruction: Apply over primary dressing as directed. Zetuvit Plus 4x8 in Discharge Instruction: Apply over primary dressing as directed. Secured With [employer], 4.5x3.1 (in/yd) Discharge Instruction: Secure with Kerlix as directed. Compression Wrap Kerlix Roll 4.5x3.1 (in/yd) Discharge Instruction: Apply Kerlix and Coban compression as directed. Coban Self-Adherent Wrap 4x5 (in/yd) Discharge Instruction: Apply over Kerlix as directed. Compression Stockings Add-Ons Electronic Signature(s) Signed: 09/11/2022 5:51:31 PM By: [name] RN, BSN Entered By: [name] on 09/11/2022 15:20:22 -------------------------------------------------------------------------------- Vitals Details Patient Name: Date of Service: [name] Long, [name] Long 09/11/2022 2:45 PM Medical Record Number: [id number] Patient Account Number: [id number] Date of Birth/Sex: Treating RN: 1940/08/22 (82 y.o. [name] Long Primary Care Emmali Karow: PA Zenovia Jordan, [place] Other Clinician: Referring Libbie Bartley: Treating Roderic Lammert/Extender: Sherryl Manges in Treatment: 0 Vital Signs Time Taken: 14:50 Temperature (F): 98.6 Height (in): 64 Pulse (bpm): 67 Weight (lbs): 162 Respiratory Rate (breaths/min): 18 Body Mass Index (BMI): 27.8 Blood Pressure (mmHg): 137/66 Reference Range: 80 - 120 mg / dl Electronic Signature(s) Signed: 09/11/2022 5:51:31 PM By: [name] RN, BSN Entered By: [name] on 09/11/2022 14:51:06

## 2022-09-12 NOTE — Progress Notes (Signed)
[place], [place] ([id number]) [id number] Page 1 of 1 Visit Report for 09/11/2022 SuperBill Details Patient Name: Date of Service: [name] Long, [name] Long 09/11/2022 Medical Record Number: [id number] Patient Account Number: [id number] Date of Birth/Sex: Treating RN: 08/22/1940 (82 y.o. 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

## 2022-09-12 NOTE — Telephone Encounter (Signed)
Returning call for results. Please advise  

## 2022-09-13 ENCOUNTER — Encounter (HOSPITAL_BASED_OUTPATIENT_CLINIC_OR_DEPARTMENT_OTHER): Payer: Medicare Other | Admitting: General Surgery

## 2022-09-13 DIAGNOSIS — E11622 Type 2 diabetes mellitus with other skin ulcer: Secondary | ICD-10-CM | POA: Diagnosis not present

## 2022-09-13 NOTE — Progress Notes (Signed)
[name] Long Long, [place] ([id number]) [id number] Page 1 of 8 Visit Report for 09/13/2022 Chief Complaint Document Details Patient Name: Date of Service: [name] Long Long, MCCARTIN 09/13/2022 8:30 A M Medical Record Number: [id number] Patient Account Number: [id number] Date of Birth/Sex: Treating RN: 03-Jul-1940 (82 y.o. F) Primary Care Provider: PA Zenovia Jordan, NO Other Clinician: Referring Provider: Treating Provider/Extender: Sherryl Manges in Treatment: 1 Information Obtained from: Patient Chief Complaint Patient seen for complaints of Non-Healing Wound. Electronic Signature(s) Signed: 09/13/2022 9:44:13 AM By: Duanne Guess MD FACS Entered By: Duanne Guess on 09/13/2022 09:44:13 -------------------------------------------------------------------------------- Debridement Details Patient Name: Date of Service: [name] Long Long 09/13/2022 8:30 A M Medical Record Number: [id number] Patient Account Number: [id number] Date of Birth/Sex: Treating RN: Jul 31, 1940 (82 y.o. F) Primary Care Provider: PA TIENT, NO Other Clinician: Referring Provider: Treating Provider/Extender: Sherryl Manges in Treatment: 1 Debridement Performed for Assessment: Wound #1 Left,Lateral Lower Leg Performed By: Physician Duanne Guess, MD Debridement Type: Debridement Severity of Tissue Pre Debridement: Fat layer exposed Level of Consciousness (Pre-procedure): Awake and Alert Pre-procedure Verification/Time Out Yes - 08:53 Taken: Start Time: 08:53 Pain Control: Lidocaine 4% Topical Solution Percent of Wound Bed Debrided: 75% T Area Debrided (cm): otal 28.26 Tissue and other material debrided: Non-Viable, Fat, Slough, Subcutaneous, Slough Level: Skin/Subcutaneous Tissue Debridement Description: Excisional Instrument: Curette Bleeding: Minimum Hemostasis Achieved: Pressure Response to Treatment: Procedure was tolerated well Level of Consciousness (Post- Awake and Alert procedure): Post  Debridement Measurements of Total Wound Length: (cm) 4.8 Width: (cm) 10 Depth: (cm) 1.8 Volume: (cm) 67.858 Character of Wound/Ulcer Post Debridement: Improved Severity of Tissue Post Debridement: Fat layer exposed Post Procedure Diagnosis [name] Long Long, [name] Long Long ([id number]) [id number] Page 2 of 8 Same as Pre-procedure Notes scribed for Dr. Lady Gary by Samuella Bruin, RN Electronic Signature(s) Signed: 09/13/2022 9:35:42 AM By: Duanne Guess MD FACS Entered By: Duanne Guess on 09/13/2022 09:35:42 -------------------------------------------------------------------------------- HPI Details Patient Name: Date of Service: [name] Long Long 09/13/2022 8:30 A M Medical Record Number: [id number] Patient Account Number: [id number] Date of Birth/Sex: Treating RN: 12-08-1940 (82 y.o. F) Primary Care Provider: PA TIENT, NO Other Clinician: Referring Provider: Treating Provider/Extender: Sherryl Manges in Treatment: 1 History of Present Illness HPI Description: ADMISSION 09/06/2022 This is an 82 year old type II diabetic (no A1c to review and patient is unaware of her last value) with a history of atrial fibrillation on chronic anticoagulant therapy, lower extremity edema, congestive heart failure, and stage III kidney disease. She divides her time between her home in [place]. [place] and here in [place] where her daughter lives. While in [place], she struck her leg on her bed frame and developed a large hematoma. She was evaluated in the emergency department there and imaging was negative for any deeper injury, such as a fracture. She returned to [facility] and has had an urgent care and emergency department visit to evaluate the site. As there had been no significant improvement in her wound, she was referred to the wound care center for further evaluation and management. I am unable to see any evidence of an attempt to evacuate the hematoma in the electronic medical  record.. There are 2 openings in her skin, but they connect to each other with significant tunneling and undermining. I am unsure if these opened spontaneously or there was a non- documented attempt to drain the hematoma site. The majority of her left lower leg from below the tibial tuberosity to just above the ankle and from the anterior tibia wrapping laterally and posteriorly to nearly the medial aspect of  her leg is involved. There is a foul odor coming from her wound. There is no frank pus but there is significant retained clot that has likely served as culture media. ABI in clinic today was 0.84. 09/08/2022: The patient came in today as an add-on due to bloody drainage that was saturating her dressing. On inspection, it appeared that there was no frank fresh blood and no active site of bleeding identified; it appears that more of the hematoma was liquefied and draining from her wound. 09/13/2022: The wound has cleaned up considerably. There is no longer any foul odor and the drainage has decreased significantly. The patient is also complaining of less pain. There is still some nonviable subcutaneous tissue and fat present but overall everything is improved from the last visit. Her culture grew out a polymicrobial population of multiple drug-resistant organisms. She is currently taking Augmentin and levofloxacin as suggested by the culture data. Electronic Signature(s) Signed: 09/13/2022 9:45:25 AM By: Duanne Guess MD FACS Entered By: Duanne Guess on 09/13/2022 09:45:25 -------------------------------------------------------------------------------- Physical Exam Details Patient Name: Date of Service: [name] Long, Long 09/13/2022 8:30 A M Medical Record Number: [id number] Patient Account Number: [id number] Date of Birth/Sex: Treating RN: 1941/02/01 (82 y.o. F) Primary Care Provider: PA Zenovia Jordan, NO Other Clinician: Referring Provider: Treating Provider/Extender: Sherryl Manges in  Treatment: 1 Constitutional . . . . no acute distress. Respiratory Normal work of breathing on room air. [place], [place] ([id number]) [id number] Page 3 of 8 Notes 09/13/2022: The wound has cleaned up considerably. There is no longer any foul odor and the drainage has decreased significantly. The patient is also complaining of less pain. There is still some nonviable subcutaneous tissue and fat present but overall everything is improved from the last visit Electronic Signature(s) Signed: 09/13/2022 9:52:20 AM By: Duanne Guess MD FACS Previous Signature: 09/13/2022 9:46:53 AM Version By: Duanne Guess MD FACS Entered By: Duanne Guess on 09/13/2022 09:52:20 -------------------------------------------------------------------------------- Physician Orders Details Patient Name: Date of Service: [name] NSEN, [name] Long Long 09/13/2022 8:30 A M Medical Record Number: [id number] Patient Account Number: [id number] Date of Birth/Sex: Treating RN: 06/06/1940 (82 y.o. [name] Primary Care Provider: PA Zenovia Jordan, NO Other Clinician: Referring Provider: Treating Provider/Extender: Sherryl Manges in Treatment: 1 Verbal / Phone Orders: No Diagnosis Coding ICD-10 Coding Code Description [id number] 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 Z79.01 Long term (current) use of anticoagulants I50.42 Chronic combined systolic (congestive) and diastolic (congestive) heart failure N18.30 Chronic kidney disease, stage 3 unspecified Follow-up Appointments ppointment in 1 week. - Dr. Lady Gary - room 1 - 7/17 at 8:15 AM Return A room 2 7/22 at 9:15 AM Nurse Visit: - 7/12 at 8:00 AM room 3 7/25 at 11:30 AM room 2 Anesthetic (In clinic) Topical Lidocaine 4% applied to wound bed Bathing/ Shower/ Hygiene May shower with protection but do not get wound dressing(s) wet. Protect dressing(s)  with water repellant cover (for example, large plastic bag) or a cast cover and may then take shower. Negative Presssure Wound Therapy Wound Vac to wound continuously at 160mm/hg pressure - waiting on insurance approval Black Foam Edema Control - Lymphedema / SCD / Other Elevate legs to the level of the heart or above for 30 minutes daily and/or when sitting for 3-4 times a day throughout the day. Avoid standing for long periods of time. Home Health Wound #1 Left,Lateral Lower Leg Admit to Home Health for skilled nursing wound  care. May utilize formulary equivalent dressing for wound treatment orders unless otherwise specified. Dressing changes to be completed by Home Health on Monday / Wednesday / Friday except when patient has scheduled visit at [facility]. Wound Treatment Wound #1 - Lower Leg Wound Laterality: Left, Lateral Cleanser: Soap and Water 2 x Per Week/30 Days Discharge Instructions: May shower and wash wound with dial antibacterial soap and water prior to dressing change. Cleanser: Vashe 5.8 (oz) 2 x Per Week/30 Days Discharge Instructions: Cleanse the wound with Vashe prior to applying a clean dressing using gauze sponges, not tissue or cotton balls. Cleanser: Dakin's Solution 0.125%, 16 (oz) (Generic) 2 x Per Week/30 Days Secondary Dressing: ABD Pad, 8x10 2 x Per Week/30 Days [name] Long, Long ([id number]) [id number] Page 4 of 8 Discharge Instructions: Apply over primary dressing as directed. Secondary Dressing: Zetuvit Plus 4x8 in 2 x Per Week/30 Days Discharge Instructions: Apply over primary dressing as directed. Secured With: [employer], 4.5x3.1 (in/yd) 2 x Per Week/30 Days Discharge Instructions: Secure with Kerlix as directed. Compression Wrap: Kerlix Roll 4.5x3.1 (in/yd) 2 x Per Week/30 Days Discharge Instructions: Apply Kerlix and Coban compression as directed. Compression Wrap: Coban Self-Adherent Wrap 4x5 (in/yd) 2 x Per  Week/30 Days Discharge Instructions: Apply over Kerlix as directed. Patient Medications llergies: No Known Allergies A Notifications Medication Indication Start End 09/13/2022 lidocaine DOSE topical 4 % cream - cream topical Electronic Signature(s) Signed: 09/13/2022 12:43:56 PM By: Duanne Guess MD FACS Entered By: Duanne Guess on 09/13/2022 09:52:45 -------------------------------------------------------------------------------- Problem List Details Patient Name: Date of Service: [name] NSEN, [name] Long Long 09/13/2022 8:30 A M Medical Record Number: [id number] Patient Account Number: [id number] Date of Birth/Sex: Treating RN: Jul 19, 1940 (82 y.o. F) Primary Care Provider: PA TIENT, NO Other Clinician: Referring Provider: Treating Provider/Extender: Sherryl Manges in Treatment: 1 Active Problems ICD-10 Encounter Code Description Active Date MDM Diagnosis L97.822 Non-pressure chronic ulcer of other part of left lower leg with fat layer exposed7/05/2022 No Yes E11.622 Type 2 diabetes mellitus with other skin ulcer 09/06/2022 No Yes Z79.01 Long term (current) use of anticoagulants 09/06/2022 No Yes I50.42 Chronic combined systolic (congestive) and diastolic (congestive) heart failure 09/06/2022 No Yes N18.30 Chronic kidney disease, stage 3 unspecified 09/06/2022 No Yes Inactive Problems Resolved Problems EMBERLI, BALLESTER ([id number]) [id number] Page 5 of 8 Electronic Signature(s) Signed: 09/13/2022 9:35:12 AM By: Duanne Guess MD FACS Entered By: Duanne Guess on 09/13/2022 09:35:12 -------------------------------------------------------------------------------- Progress Note Details Patient Name: Date of Service: [name] NSEN, [name] Long Long 09/13/2022 8:30 A M Medical Record Number: [id number] Patient Account Number: [id number] Date of Birth/Sex: Treating RN: 11/20/1940 (82 y.o. F) Primary Care Provider: PA Zenovia Jordan, NO Other Clinician: Referring Provider: Treating  Provider/Extender: Sherryl Manges in Treatment: 1 Subjective Chief Complaint Information obtained from Patient Patient seen for complaints of Non-Healing Wound. History of Present Illness (HPI) ADMISSION 09/06/2022 This is an 82 year old type II diabetic (no A1c to review and patient is unaware of her last value) with a history of atrial fibrillation on chronic anticoagulant therapy, lower extremity edema, congestive heart failure, and stage III kidney disease. She divides her time between her home in [place] and here in [place] where her daughter lives. While in [place], she struck her leg on her bed frame and developed a large hematoma. She was evaluated in the emergency department there and imaging was negative for any deeper injury, such as a fracture. She returned to [facility] and has had an urgent care and emergency department visit to evaluate the site. As  there had been no significant improvement in her wound, she was referred to the wound care center for further evaluation and management. I am unable to see any evidence of an attempt to evacuate the hematoma in the electronic medical record.. There are 2 openings in her skin, but they connect to each other with significant tunneling and undermining. I am unsure if these opened spontaneously or there was a non- documented attempt to drain the hematoma site. The majority of her left lower leg from below the tibial tuberosity to just above the ankle and from the anterior tibia wrapping laterally and posteriorly to nearly the medial aspect of her leg is involved. There is a foul odor coming from her wound. There is no frank pus but there is significant retained clot that has likely served as culture media. ABI in clinic today was 0.84. 09/08/2022: The patient came in today as an add-on due to bloody drainage that was saturating her dressing. On inspection, it appeared that there was no frank fresh blood and no active site of  bleeding identified; it appears that more of the hematoma was liquefied and draining from her wound. 09/13/2022: The wound has cleaned up considerably. There is no longer any foul odor and the drainage has decreased significantly. The patient is also complaining of less pain. There is still some nonviable subcutaneous tissue and fat present but overall everything is improved from the last visit. Her culture grew out a polymicrobial population of multiple drug-resistant organisms. She is currently taking Augmentin and levofloxacin as suggested by the culture data. Patient History Information obtained from Patient. Family History No family history of Hereditary Spherocytosis, Hypertension, Kidney Disease, Lung Disease, Seizures, Stroke, Thyroid Problems, Tuberculosis. Social History Never smoker, Marital Status - Widowed, Alcohol Use - Never, Drug Use - No History, Caffeine Use - Never. Medical History Cardiovascular Patient has history of Arrhythmia - A fib, Congestive Heart Failure, Hypertension Endocrine Patient has history of Type II Diabetes - since 2019 Medical A Surgical History Notes nd Cardiovascular High Cholesterol Genitourinary Kidney Disease Objective GRAYLYN, BUNNEY ([id number]) [id number] Page 6 of 8 Constitutional no acute distress. Vitals Time Taken: 8:16 AM, Height: 64 in, Weight: 162 lbs, BMI: 27.8, Temperature: 97.9 F, Pulse: 79 bpm, Respiratory Rate: 18 breaths/min, Blood Pressure: 114/56 mmHg. Respiratory Normal work of breathing on room air. General Notes: 09/13/2022: The wound has cleaned up considerably. There is no longer any foul odor and the drainage has decreased significantly. The patient is also complaining of less pain. There is still some nonviable subcutaneous tissue and fat present but overall everything is improved from the last visit Integumentary (Hair, Skin) Wound #1 status is Open. Original cause of wound was Hematoma. The  date acquired was: 08/05/2022. The wound has been in treatment 1 weeks. The wound is located on the Left,Lateral Lower Leg. The wound measures 4.8cm length x 10cm width x 1.8cm depth; 37.699cm^2 area and 67.858cm^3 volume. There is Fat Layer (Subcutaneous Tissue) exposed. There is no tunneling noted, however, there is undermining starting at 12:00 and ending at 12:00 with a maximum distance of 5cm. There is a large amount of sanguinous drainage noted. The wound margin is distinct with the outline attached to the wound base. There is large (67-100%) red, friable granulation within the wound bed. There is a small (1-33%) amount of necrotic tissue within the wound bed including Adherent Slough. The periwound skin appearance had no abnormalities noted for texture. The periwound skin appearance had no abnormalities noted for moisture.  The periwound skin appearance exhibited: Hemosiderin Staining. Periwound temperature was noted as No Abnormality. 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 There was a Excisional Skin/Subcutaneous Tissue Debridement with a total area of 28.26 sq cm performed by Duanne Guess, MD. With the following instrument(s): Curette to remove Non-Viable tissue/material. Material removed includes Fat, Subcutaneous Tissue, and Slough after achieving pain control using Lidocaine 4% Topical Solution. No specimens were taken. A time out was conducted at 08:53, prior to the start of the procedure. A Minimum  amount of bleeding was controlled with Pressure. The procedure was tolerated well. Post Debridement Measurements: 4.8cm length x 10cm width x 1.8cm depth; 67.858cm^3 volume. Character of Wound/Ulcer Post Debridement is improved. Severity of Tissue Post Debridement is: Fat layer exposed. Post procedure Diagnosis Wound #1: Same as Pre-Procedure General Notes: scribed for Dr. Lady Gary by Samuella Bruin, RN. Plan Follow-up Appointments: Return Appointment in 1 week. - Dr. Lady Gary - room 1 - 7/17 at 8:15 AM room 2 7/22 at 9:15 AM Nurse Visit: - 7/12 at 8:00 AM room 3 7/25 at 11:30 AM room 2 Anesthetic: (In clinic) Topical Lidocaine 4% applied to wound bed Bathing/ Shower/ Hygiene: May shower with protection but do not get wound dressing(s) wet. Protect dressing(s) with water repellant cover (for example, large plastic bag) or a cast cover and may then take shower. Negative Presssure Wound Therapy: Wound Vac to wound continuously at 126mm/hg pressure - waiting on insurance approval Black Foam Edema Control - Lymphedema / SCD / Other: Elevate legs to the level of the heart or above for 30 minutes daily and/or when sitting for 3-4 times a day throughout the day. Avoid standing for long periods of time. Home Health: Wound #1 Left,Lateral Lower Leg: Admit to Home Health for skilled nursing wound care. May utilize formulary equivalent dressing for wound treatment orders unless otherwise specified. Dressing changes to be completed by Home Health on Monday / Wednesday / Friday except when patient has scheduled visit at [facility]. The following medication(s) was prescribed: lidocaine topical 4 % cream cream topical was prescribed at facility WOUND #1: - Lower Leg Wound Laterality: Left, Lateral Cleanser: Soap and Water 2 x Per Week/30 Days Discharge Instructions: May shower and wash wound with dial antibacterial soap and water prior to dressing change. Cleanser: Vashe 5.8 (oz) 2 x Per Week/30  Days Discharge Instructions: Cleanse the wound with Vashe prior to applying a clean dressing using gauze sponges, not tissue or cotton balls. Cleanser: Dakin's Solution 0.125%, 16 (oz) (Generic) 2 x Per Week/30 Days Secondary Dressing: ABD Pad, 8x10 2 x Per Week/30 Days [name] Long, Long ([id number]) [id number] Page 7 of 8 Discharge Instructions: Apply over primary dressing as directed. Secondary Dressing: Zetuvit Plus 4x8 in 2 x Per Week/30 Days Discharge Instructions: Apply over primary dressing as directed. Secured With: [employer], 4.5x3.1 (in/yd) 2 x Per Week/30 Days Discharge Instructions: Secure with Kerlix as directed. Compression Wrap: Kerlix Roll 4.5x3.1 (in/yd) 2 x Per Week/30 Days Discharge Instructions: Apply Kerlix and Coban compression as directed. Compression Wrap: Coban Self-Adherent Wrap 4x5 (in/yd) 2 x  Per Week/30 Days Discharge Instructions: Apply over Kerlix as directed. 09/13/2022: The wound has cleaned up considerably. There is no longer any foul odor and the drainage has decreased significantly. The patient is also complaining of less pain. There is still some nonviable subcutaneous tissue and fat present but overall everything is improved from the last visit I used a curette to debride slough, subcutaneous tissue, and some residual necrotic fat from her wound. We will continue to pack the wound with Dakin's- moistened Kerlix. We are still awaiting wound VAC approval and we have not yet been able to secure home health agency for her. She will need nurse visits to change the packing between her visits with me. I will see her in a week. She will complete her course of oral antibiotics. Electronic Signature(s) Signed: 09/13/2022 9:53:52 AM By: Duanne Guess MD FACS Entered By: Duanne Guess on 09/13/2022 09:53:52 -------------------------------------------------------------------------------- HxROS Details Patient Name: Date of  Service: [name] NSEN, [name] Long Long 09/13/2022 8:30 A M Medical Record Number: [id number] Patient Account Number: [id number] Date of Birth/Sex: Treating RN: 09-Apr-1940 (82 y.o. F) Primary Care Provider: PA Zenovia Jordan, NO Other Clinician: Referring Provider: Treating Provider/Extender: Sherryl Manges in Treatment: 1 Information Obtained From Patient Cardiovascular Medical History: Positive for: Arrhythmia - A fib; Congestive Heart Failure; Hypertension Past Medical History Notes: High Cholesterol Endocrine Medical History: Positive for: Type II Diabetes - since 2019 Genitourinary Medical History: Past Medical History Notes: Kidney Disease Immunizations Pneumococcal Vaccine: Received Pneumococcal Vaccination: No Implantable Devices No devices added Family and Social History Hereditary Spherocytosis: No; Hypertension: No; Kidney Disease: No; Lung Disease: No; Seizures: No; Stroke: No; Thyroid Problems: No; Tuberculosis: No; Never smoker; Marital Status - Widowed; Alcohol Use: Never; Drug Use: No History; Caffeine Use: Never; Financial Concerns: No; Food, Clothing or Shelter Needs: No; Support System Lacking: No; Transportation Concerns: No Madill, [name] Long Long ([id number]) [id number] Page 8 of 8 Electronic Signature(s) Signed: 09/13/2022 12:43:56 PM By: Duanne Guess MD FACS Entered By: Duanne Guess on 09/13/2022 09:45:55 -------------------------------------------------------------------------------- SuperBill Details Patient Name: Date of Service: [name] NSEN, [name] Long Long 09/13/2022 Medical Record Number: [id number] Patient Account Number: [id number] Date of Birth/Sex: Treating RN: 10-03-1940 (82 y.o. F) Primary Care Provider: PA TIENT, NO Other Clinician: Referring Provider: Treating Provider/Extender: Sherryl Manges in Treatment: 1 Diagnosis Coding ICD-10 Codes Code Description [phone] Non-pressure chronic ulcer of other part of left lower leg with fat layer  exposed E11.622 Type 2 diabetes mellitus with other skin ulcer Z79.01 Long term (current) use of anticoagulants I50.42 Chronic combined systolic (congestive) and diastolic (congestive) heart failure N18.30 Chronic kidney disease, stage 3 unspecified Facility Procedures : CPT4 Code: [id number] Description: 11042 - DEB SUBQ TISSUE 20 SQ CM/< ICD-10 Diagnosis Description L97.822 Non-pressure chronic ulcer of other part of left lower leg with fat layer expo Modifier: sed Quantity: 1 : CPT4 Code: [id number] Description: 11045 - DEB SUBQ TISS EA ADDL 20CM ICD-10 Diagnosis Description L97.822 Non-pressure chronic ulcer of other part of left lower leg with fat layer expo Modifier: sed Quantity: 1 Physician Procedures : CPT4 Code Description Modifier [id number] - WC PHYS LEVEL 4 - EST PT 25 ICD-10 Diagnosis Description L97.822 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  Z79.01 Long term (current) use of anticoagulants I50.42 Chronic combined systolic (congestive) and diastolic (congestive) heart failure Quantity: 1 : [id number] 11042 - WC PHYS SUBQ TISS 20 SQ CM ICD-10 Diagnosis Description L97.822 Non-pressure chronic ulcer of other  part of left lower leg with fat layer exposed Quantity: 1 : [id number] 11045 - WC PHYS SUBQ TISS EA ADDL 20 CM ICD-10 Diagnosis Description L97.822 Non-pressure chronic ulcer of other part of left lower leg with fat layer exposed Quantity: 1 Electronic Signature(s) Signed: 09/13/2022 9:54:18 AM By: Duanne Guess MD FACS Entered By: Duanne Guess on 09/13/2022 09:54:18

## 2022-09-13 NOTE — Progress Notes (Signed)
[place], [place] ([id number]) [id number] Page 1 of 7 Visit Report for 09/13/2022 Arrival Information Details Patient Name: Date of Service: [name] Long, [name] Long 09/13/2022 8:30 A M Medical Record Number: [id number] Patient Account Number: [id number] Date of Birth/Sex: Treating RN: 1940/04/29 (82 y.o. Caro Hight, Ladona Ridgel Primary Care Gudelia Eugene: PA Zenovia Jordan, NO Other Clinician: Referring Esti Demello: Treating Adair Lemar/Extender: Sherryl Manges in Treatment: 1 Visit Information History Since Last Visit Added or deleted any medications: No Patient Arrived: Walker Any new allergies or adverse reactions: No Arrival Time: 08:13 Had a fall or experienced change in No Accompanied By: daughter activities of daily living that may affect Transfer Assistance: None risk of falls: Patient Identification Verified: Yes Signs or symptoms of abuse/neglect since last visito No Secondary Verification Process Completed: Yes Hospitalized since last visit: No Patient Requires Transmission-Based Precautions: No Implantable device outside of the clinic excluding No Patient Has Alerts: No cellular tissue based products placed in the center since last visit: Has Dressing in Place as Prescribed: Yes Has Compression in Place as Prescribed: Yes Pain Present Now: No Electronic Signature(s) Signed: 09/13/2022 4:26:01 PM By: Samuella Bruin Entered By: Samuella Bruin on 09/13/2022 08:16:12 -------------------------------------------------------------------------------- Encounter Discharge Information Details Patient Name: Date of Service: [name] Long, [name] Long 09/13/2022 8:30 A M Medical Record Number: [id number] Patient Account Number: [id number] Date of Birth/Sex: Treating RN: 11-02-1940 (82 y.o. [name] Primary Care Jamyson Jirak: PA Zenovia Jordan, [place] Other Clinician: Referring Christain Niznik: Treating Norbert Malkin/Extender: Sherryl Manges in Treatment: 1 Encounter Discharge Information Items  Post Procedure Vitals Discharge Condition: Stable Temperature (F): 97.9 Ambulatory Status: Walker Pulse (bpm): 79 Discharge Destination: Home Respiratory Rate (breaths/min): 18 Transportation: Private Auto Blood Pressure (mmHg): 114/56 Accompanied By: daughter Schedule Follow-up Appointment: Yes Clinical Summary of Care: Patient Declined Electronic Signature(s) Signed: 09/13/2022 4:26:01 PM By: Samuella Bruin Entered By: Samuella Bruin on 09/13/2022 09:51:22 Ros[place], [name] ([id number]) [id number] Page 2 of 7 -------------------------------------------------------------------------------- Lower Extremity Assessment Details Patient Name: Date of Service: [name] Long, [name] Long 09/13/2022 8:30 A M Medical Record Number: [id number] Patient Account Number: [id number] Date of Birth/Sex: Treating RN: Nov 05, 1940 (82 y.o. [name] Primary Care Twania Bujak: PA Zenovia Jordan, [place] Other Clinician: Referring Kensley Valladares: Treating Chancie Lampert/Extender: Sherryl Manges in Treatment: 1 Edema Assessment Assessed: [Left: No] [Right: No] [Left: Edema] [Right: :] Calf Left: Right: Point of Measurement: From Medial Instep 39.8 cm 35.3 cm Ankle Left: Right: Point of Measurement: From Medial Instep 25.8 cm 26 cm Vascular Assessment Pulses: Dorsalis Pedis Palpable: [Left:Yes] Electronic Signature(s) Signed: 09/13/2022 4:26:01 PM By: Samuella Bruin Entered By: Samuella Bruin on 09/13/2022 08:22:47 -------------------------------------------------------------------------------- Multi Wound Chart Details Patient Name: Date of Service: [name] Long, [name] Long 09/13/2022 8:30 A M Medical Record Number: [id number] Patient Account Number: [id number] Date of Birth/Sex: Treating RN: 1941/01/11 (82 y.o. F) Primary Care Larnce Schnackenberg: PA Zenovia Jordan, [place] Other Clinician: Referring Hannah Strader: Treating Ettie Krontz/Extender: Sherryl Manges in Treatment: 1 Vital Signs Height(in):  64 Pulse(bpm): 79 Weight(lbs): 162 Blood Pressure(mmHg): 114/56 Body Mass Index(BMI): 27.8 Temperature(F): 97.9 Respiratory Rate(breaths/min): 18 [1:Photos:] [N/A:N/A] Left, Lateral Lower Leg N/A N/A Wound Location: Hematoma N/A N/A Wounding 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, Congestive Heart Failure, N/A N/A Comorbid History: Hypertension, Type II Diabetes 08/05/2022 N/A N/A Date Acquired: 1 N/A N/A Weeks of Treatment: Open N/A N/A Wound Status: No N/A N/A Wound Recurrence: Yes N/A N/A Clustered Wound: 2 N/A N/A Clustered Quantity: 4.8x10x1.8 N/A N/A Measurements L x W x D (cm) 37.699 N/A N/A A (cm) : rea 67.858 N/A N/A Volume (cm) : -  6.70% N/A N/A % Reduction in A rea: -113.30% N/A N/A % Reduction in Volume: 12 Starting Position 1 (o'clock): 12 Ending Position 1 (o'clock): 5 Maximum Distance 1 (cm): Yes N/A N/A Undermining: Grade 2 N/A N/A Classification: Large N/A N/A Exudate A mount: Sanguinous N/A N/A Exudate Type: red N/A N/A Exudate Color: Distinct, outline attached N/A N/A Wound Margin: Large (67-100%) N/A N/A Granulation A mount: Red, Friable N/A N/A Granulation Quality: Small (1-33%) N/A N/A Necrotic A mount: Fat Layer (Subcutaneous Tissue): Yes N/A N/A Exposed Structures: Fascia: No Tendon: No Muscle: No Joint: No Bone: No Small (1-33%) N/A N/A Epithelialization: Debridement - Excisional N/A N/A Debridement: Pre-procedure Verification/Time Out 08:53 N/A N/A Taken: Lidocaine 4% Topical Solution N/A N/A Pain Control: Subcutaneous, Slough N/A N/A Tissue Debrided: Skin/Subcutaneous Tissue N/A N/A Level: 18.84 N/A N/A Debridement A (sq cm): rea Curette N/A N/A Instrument: Minimum N/A N/A Bleeding: Pressure N/A N/A Hemostasis A chieved: Procedure was tolerated well N/A N/A Debridement Treatment Response: 4.8x10x1.8 N/A N/A Post Debridement Measurements L x W x D  (cm) 67.858 N/A N/A Post Debridement Volume: (cm) Rash: No N/A N/A Periwound Skin Texture: Maceration: No N/A N/A Periwound Skin Moisture: Dry/Scaly: No Hemosiderin Staining: Yes N/A N/A Periwound Skin Color: No Abnormality N/A N/A Temperature: Debridement N/A N/A Procedures Performed: Treatment Notes Electronic Signature(s) Signed: 09/13/2022 9:35:19 AM By: Duanne Guess MD FACS Entered By: Duanne Guess on 09/13/2022 09:35:19 -------------------------------------------------------------------------------- Multi-Disciplinary Care Plan Details Patient Name: Date of Service: [name] Long, [name] Long 09/13/2022 8:30 A M Medical Record Number: [id number] Patient Account Number: [id number] Date of Birth/Sex: Treating RN: March 13, 1940 (82 y.o. [name] Primary Care Castor Gittleman: PA Zenovia Jordan, [place] Other Clinician: Referring Deashia Soule: Treating Charly Hunton/Extender: Sherryl Manges in Treatment: 1 [place], Aunika ([id number]) [id number] Page 4 of 7 Active Inactive Wound/Skin Impairment Nursing Diagnoses: Impaired tissue integrity Goals: Patient/caregiver will verbalize understanding of skin care regimen Date Initiated: 09/06/2022 Target Resolution Date: 11/04/2022 Goal Status: Active Interventions: Assess patient/caregiver ability to obtain necessary supplies Assess patient/caregiver ability to perform ulcer/skin care regimen upon admission and as needed Assess ulceration(s) every visit Provide education on ulcer and skin care Screen for HBO Treatment Activities: Skin care regimen initiated : 09/06/2022 Topical wound management initiated : 09/06/2022 Notes: Electronic Signature(s) Signed: 09/13/2022 4:26:01 PM By: Samuella Bruin Entered By: Samuella Bruin on 09/13/2022 09:50:20 -------------------------------------------------------------------------------- Pain Assessment Details Patient Name: Date of Service: [name] Long, [name] Long 09/13/2022 8:30 A  M Medical Record Number: [id number] Patient Account Number: [id number] Date of Birth/Sex: Treating RN: 09-Mar-1940 (82 y.o. [name] Primary Care Sair Faulcon: PA Zenovia Jordan, [place] Other Clinician: Referring Manjit Bufano: Treating Asheton Scheffler/Extender: Sherryl Manges in Treatment: 1 Active Problems Location of Pain Severity and Description of Pain Patient Has Paino No Site Locations Rate the pain. Current Pain Level: 0 Pain Management and Medication Current Pain Management: ALICIA, SEIB ([id number]) [id number] Page 5 of 7 Electronic Signature(s) Signed: 09/13/2022 4:26:01 PM By: Gelene Mink By: Samuella Bruin on 09/13/2022 08:16:34 -------------------------------------------------------------------------------- Patient/Caregiver Education Details Patient Name: Date of Service: [name] Long, Wladyslawa 7/10/2024andnbsp8:30 A M Medical Record Number: [id number] Patient Account Number: [id number] Date of Birth/Gender: Treating RN: 10-28-1940 (82 y.o. [name] Primary Care Physician: PA Zenovia Jordan, [place] Other Clinician: Referring Physician: Treating Physician/Extender: Sherryl Manges in Treatment: 1 Education Assessment Education Provided To: Patient Education Topics Provided Wound/Skin Impairment: Methods: Explain/Verbal Responses: Reinforcements needed, State content correctly Electronic Signature(s) Signed: 09/13/2022 4:26:01 PM By: Samuella Bruin Entered By: Samuella Bruin on 09/13/2022 09:50:52 -------------------------------------------------------------------------------- Wound Assessment Details Patient Name:  Date of Service: [name] Long, [name] Long 09/13/2022 8:30 A M Medical Record Number: [id number] Patient Account Number: [id number] Date of Birth/Sex: Treating RN: 07/09/1940 (82 y.o. [name] Primary Care Keni Wafer: PA Zenovia Jordan, [place] Other Clinician: Referring Delwin Raczkowski: Treating Akshara Blumenthal/Extender: Sherryl Manges in Treatment: 1 Wound Status Wound Number: 1 Primary Diabetic Wound/Ulcer of the Lower Extremity Etiology: Wound Location: Left, Lateral Lower Leg Wound Status: Open Wounding Event: Hematoma Comorbid Arrhythmia, Congestive Heart Failure, Hypertension, Type II Date Acquired: 08/05/2022 History: Diabetes Weeks Of Treatment: 1 Clustered Wound: Yes Photos [place], [name] ([id number]) [id number] Page 6 of 7 Wound Measurements Length: (cm) 4.8 Width: (cm) 10 Depth: (cm) 1.8 Clustered Quantity: 2 Area: (cm) 37.699 Volume: (cm) 67.858 % Reduction in Area: -6.7% % Reduction in Volume: -113.3% Epithelialization: Small (1-33%) Tunneling: No Undermining: Yes Starting Position (o'clock): 12 Ending Position (o'clock): 12 Maximum Distance: (cm) 5 Wound Description Classification: Grade 2 Wound Margin: Distinct, outline attached Exudate Amount: Large Exudate Type: Sanguinous Exudate Color: red Foul Odor After Cleansing: No Slough/Fibrino Yes Wound Bed Granulation Amount: Large (67-100%) Exposed Structure Granulation Quality: Red, Friable Fascia Exposed: No Necrotic Amount: Small (1-33%) Fat Layer (Subcutaneous Tissue) Exposed: Yes Necrotic Quality: Adherent Slough Tendon Exposed: No Muscle Exposed: No Joint Exposed: No Bone Exposed: No Periwound Skin Texture Texture Color No Abnormalities Noted: Yes No Abnormalities Noted: No Hemosiderin Staining: Yes Moisture No Abnormalities Noted: Yes Temperature / Pain Temperature: No Abnormality Treatment Notes Wound #1 (Lower Leg) Wound Laterality: Left, Lateral Cleanser Soap and Water Discharge Instruction: May shower and wash wound with dial antibacterial soap and water prior to dressing change. Vashe 5.8 (oz) Discharge Instruction: Cleanse the wound with Vashe prior to applying a clean dressing using gauze sponges, not tissue or cotton balls. Dakin's Solution 0.125%, 16 (oz) Peri-Wound  Care Topical Primary Dressing Secondary Dressing ABD Pad, 8x10 Discharge Instruction: Apply over primary dressing as directed. Zetuvit Plus 4x8 in Discharge Instruction: Apply over primary dressing as directed. Secured With [employer], 4.5x3.1 (in/yd) Discharge Instruction: Secure with Kerlix as directed. [place], [place] ([id number]) [id number] Page 7 of 7 Compression Wrap Kerlix Roll 4.5x3.1 (in/yd) Discharge Instruction: Apply Kerlix and Coban compression as directed. Coban Self-Adherent Wrap 4x5 (in/yd) Discharge Instruction: Apply over Kerlix as directed. Compression Stockings Add-Ons Electronic Signature(s) Signed: 09/13/2022 4:26:01 PM By: Samuella Bruin Entered By: Samuella Bruin on 09/13/2022 08:28:12 -------------------------------------------------------------------------------- Vitals Details Patient Name: Date of Service: [name] Long, [name] Long 09/13/2022 8:30 A M Medical Record Number: [id number] Patient Account Number: [id number] Date of Birth/Sex: Treating RN: [birth year] (82 y.o. [name] Primary Care Ioanna Colquhoun: PA Zenovia Jordan, [place] Other Clinician: Referring Moreen Piggott: Treating Domonic Hiscox/Extender: Sherryl Manges in Treatment: 1 Vital Signs Time Taken: 08:16 Temperature (F): 97.9 Height (in): 64 Pulse (bpm): 79 Weight (lbs): 162 Respiratory Rate (breaths/min): 18 Body Mass Index (BMI): 27.8 Blood Pressure (mmHg): 114/56 Reference Range: 80 - 120 mg / dl Electronic Signature(s) Signed: 09/13/2022 4:26:01 PM By: Samuella Bruin Entered By: Samuella Bruin on 09/13/2022 08:16:55

## 2022-09-15 ENCOUNTER — Encounter (HOSPITAL_BASED_OUTPATIENT_CLINIC_OR_DEPARTMENT_OTHER): Payer: Medicare Other | Admitting: General Surgery

## 2022-09-15 DIAGNOSIS — E11622 Type 2 diabetes mellitus with other skin ulcer: Secondary | ICD-10-CM | POA: Diagnosis not present

## 2022-09-20 ENCOUNTER — Encounter (HOSPITAL_BASED_OUTPATIENT_CLINIC_OR_DEPARTMENT_OTHER): Payer: Medicare Other | Admitting: General Surgery

## 2022-09-20 DIAGNOSIS — E11622 Type 2 diabetes mellitus with other skin ulcer: Secondary | ICD-10-CM | POA: Diagnosis not present

## 2022-09-20 NOTE — Progress Notes (Signed)
[place], [place] ([id number]) [id number] Page 1 of 4 Visit Report for 09/15/2022 Arrival Information Details Patient Name: Date of Service: [name] Long, [name] Long 09/15/2022 8:00 A M Medical Record Number: [id number] Patient Account Number: [id number] Date of Birth/Sex: Treating RN: Jun 12, 1940 (82 y.o. [name] Long Primary Care [name] Long: PA Zenovia Jordan, [place] Other Clinician: Referring Litzi Binning: Treating Kavita Bartl/Extender: Sherryl Manges in Treatment: 1 Visit Information History Since Last Visit All ordered tests and consults were completed: Yes Patient Arrived: Ambulatory Added or deleted any medications: No Arrival Time: 08:40 Any new allergies or adverse reactions: No Accompanied By: daughter Had a fall or experienced change in No Transfer Assistance: None activities of daily living that may affect Patient Requires Transmission-Based Precautions: No risk of falls: Patient Has Alerts: No Signs or symptoms of abuse/neglect since last visito No Hospitalized since last visit: No Implantable device outside of the clinic excluding No cellular tissue based products placed in the center since last visit: Has Dressing in Place as Prescribed: Yes Pain Present Now: No Electronic Signature(s) Signed: 09/20/2022 7:59:22 AM By: Brenton Grills Entered By: Brenton Grills on 09/15/2022 08:41:27 -------------------------------------------------------------------------------- Encounter Discharge Information Details Patient Name: Date of Service: [name] Long, [name] Long 09/15/2022 8:00 A M Medical Record Number: [id number] Patient Account Number: [id number] Date of Birth/Sex: Treating RN: 1940/05/01 (82 y.o. [name] Long Primary Care Dayne Chait: PA Zenovia Jordan, [place] Other Clinician: Referring Drezden Seitzinger: Treating Emireth Cockerham/Extender: Sherryl Manges in Treatment: 1 Encounter Discharge Information Items Discharge Condition: Stable Ambulatory Status: Walker Discharge Destination:  Home Transportation: Private Auto Accompanied By: daughter Schedule Follow-up Appointment: Yes Clinical Summary of Care: Patient Declined Electronic Signature(s) Signed: 09/20/2022 7:59:22 AM By: Brenton Grills Entered By: Brenton Grills on 09/15/2022 08:44:53 [place], [name] Long ([id number]) [id number] Page 2 of 4 -------------------------------------------------------------------------------- Patient/Caregiver Education Details Patient Name: Date of Service: [name] Long, [name] Long 7/12/2024andnbsp8:00 A M Medical Record Number: [id number] Patient Account Number: [id number] Date of Birth/Gender: Treating RN: 07/31/1940 (82 y.o. [name] Long Primary Care Physician: PA Zenovia Jordan, [place] Other Clinician: Referring Physician: Treating Physician/Extender: Sherryl Manges in Treatment: 1 Education Assessment Education Provided To: Patient and Caregiver Education Topics Provided Wound/Skin Impairment: Methods: Explain/Verbal Responses: State content correctly Electronic Signature(s) Signed: 09/20/2022 7:59:22 AM By: Brenton Grills Entered By: Brenton Grills on 09/15/2022 08:44:31 -------------------------------------------------------------------------------- Wound Assessment Details Patient Name: Date of Service: [name] Long, [name] Long 09/15/2022 8:00 A M Medical Record Number: [id number] Patient Account Number: [id number] Date of Birth/Sex: Treating RN: 1940-04-24 (82 y.o. [name] Long Primary Care Remington Skalsky: PA Zenovia Jordan, [place] Other Clinician: Referring Alfhild Partch: Treating Riyansh Gerstner/Extender: Sherryl Manges in Treatment: 1 Wound Status Wound Number: 1 Primary Diabetic Wound/Ulcer of the Lower Extremity Etiology: Wound Location: Left, Lateral Lower Leg Wound Status: Open Wounding Event: Hematoma Comorbid Arrhythmia, Congestive Heart Failure, Hypertension, Type II Date Acquired: 08/05/2022 History: Diabetes Weeks Of Treatment: 1 Clustered Wound: Yes Wound  Measurements Length: (cm) 4.8 Width: (cm) 10 Depth: (cm) 1.8 Clustered Quantity: 2 Area: (cm) 37.699 Volume: (cm) 67.858 % Reduction in Area: -6.7% % Reduction in Volume: -113.3% Epithelialization: Small (1-33%) Tunneling: Yes Position (o'clock): 3 Maximum Distance: (cm) 10 Wound Description Classification: Grade 2 Wound Margin: Distinct, outline attached Exudate Amount: Large Exudate Type: Sanguinous Exudate Color: red Foul Odor After Cleansing: No Slough/Fibrino Yes Wound Bed [facility], [name] Long ([id number]) [id number] Page 3 of 4 Granulation Amount: Large (67-100%) Exposed Structure Granulation Quality: Red, Friable Fascia Exposed: No Necrotic Amount: Small (1-33%) Fat Layer (Subcutaneous Tissue) Exposed: Yes Necrotic Quality: Adherent Slough Tendon Exposed: No Muscle Exposed: No Joint Exposed: No Bone Exposed:  No Periwound Skin Texture Texture Color No Abnormalities Noted: Yes No Abnormalities Noted: No Hemosiderin Staining: Yes Moisture No Abnormalities Noted: Yes Temperature / Pain Temperature: No Abnormality Treatment Notes Wound #1 (Lower Leg) Wound Laterality: Left, Lateral Cleanser Soap and Water Discharge Instruction: May shower and wash wound with dial antibacterial soap and water prior to dressing change. Vashe 5.8 (oz) Discharge Instruction: Cleanse the wound with Vashe prior to applying a clean dressing using gauze sponges, not tissue or cotton balls. Dakin's Solution 0.125%, 16 (oz) Peri-Wound Care Topical Primary Dressing Secondary Dressing ABD Pad, 8x10 Discharge Instruction: Apply over primary dressing as directed. Zetuvit Plus 4x8 in Discharge Instruction: Apply over primary dressing as directed. Secured With [employer], 4.5x3.1 (in/yd) Discharge Instruction: Secure with Kerlix as directed. Compression Wrap Kerlix Roll 4.5x3.1 (in/yd) Discharge Instruction: Apply Kerlix and Coban compression as  directed. Coban Self-Adherent Wrap 4x5 (in/yd) Discharge Instruction: Apply over Kerlix as directed. Compression Stockings Add-Ons Electronic Signature(s) Signed: 09/20/2022 7:59:22 AM By: Brenton Grills Entered By: Brenton Grills on 09/15/2022 08:43:26 -------------------------------------------------------------------------------- Vitals Details Patient Name: Date of Service: [name] Long, [name] Long 09/15/2022 8:00 A M Medical Record Number: [id number] Patient Account Number: [id number] Date of Birth/Sex: Treating RN: [birth year] (82 y.o. [name] Long Primary Care Nachelle Negrette: PA Zenovia Jordan, [place] Other Clinician: Referring Julicia Krieger: Treating Graciella Arment/Extender: Sherryl Manges in Treatment: 1 Vital Signs [place], [place] ([id number]) [id number] Page 4 of 4 Time Taken: 08:42 Temperature (F): 97.9 Height (in): 64 Pulse (bpm): 79 Weight (lbs): 162 Respiratory Rate (breaths/min): 18 Body Mass Index (BMI): 27.8 Blood Pressure (mmHg): 114/56 Reference Range: 80 - 120 mg / dl Electronic Signature(s) Signed: 09/20/2022 7:59:22 AM By: Brenton Grills Entered By: Brenton Grills on 09/15/2022 08:42:45

## 2022-09-20 NOTE — Progress Notes (Addendum)
[place], [place] ([id number]) [id number] Page 1 of 8 Visit Report for 09/20/2022 Chief Complaint Document Details Patient Name: Date of Service: [name] Long, [name] Long 09/20/2022 8:15 A M Medical Record Number: [id number] Patient Account Number: [id number] Date of Birth/Sex: Treating RN: 1940-08-22 (82 y.o. F) Primary Care Provider: PA Zenovia Jordan, NO Other Clinician: Referring Provider: Treating Provider/Extender: Sherryl Manges in Treatment: 2 Information Obtained from: Patient Chief Complaint Patient seen for complaints of Non-Healing Wound. Electronic Signature(s) Signed: 09/20/2022 9:12:24 AM By: Duanne Guess MD FACS Entered By: Duanne Guess on 09/20/2022 09:12:24 -------------------------------------------------------------------------------- Debridement Details Patient Name: Date of Service: [name] Long, [name] Long 09/20/2022 8:15 A M Medical Record Number: [id number] Patient Account Number: [id number] Date of Birth/Sex: Treating RN: [birth year] (82 y.o. [name] Provider: PA Zenovia Jordan, [place] Other Clinician: Referring Provider: Treating Provider/Extender: Sherryl Manges in Treatment: 2 Debridement Performed for Assessment: Wound #3 Left,Posterior Lower Leg Performed By: Physician Duanne Guess, MD Debridement Type: Debridement Level of Consciousness (Pre-procedure): Awake and Alert Pre-procedure Verification/Time Out Yes - 09:05 Taken: Start Time: 09:08 Pain Control: Lidocaine 4% T opical Solution Percent of Wound Bed Debrided: 100% T Area Debrided (cm): otal 4.33 Tissue and other material debrided: Non-Viable, Slough, Slough Level: Non-Viable Tissue Debridement Description: Selective/Open Wound Instrument: Curette Bleeding: Minimum Hemostasis Achieved: Pressure Procedural Pain: 0 Post Procedural Pain: 0 Response to Treatment: Procedure was tolerated well Level of Consciousness (Post- Awake and Alert procedure): Post  Debridement Measurements of Total Wound Length: (cm) 2.4 Width: (cm) 2.3 Depth: (cm) 0.1 Volume: (cm) 0.434 Character of Wound/Ulcer Post Debridement: Improved Post Procedure Diagnosis [name] Long, [name] Long ([id number]) [id number] Page 2 of 8 Same as Pre-procedure Notes scribed for Dr. Lady Gary by [name], RN Electronic Signature(s) Signed: 09/20/2022 12:38:39 PM By: Duanne Guess MD FACS Signed: 09/20/2022 5:33:12 PM By: [name] RN, BSN Entered By: [name] on 09/20/2022 09:12:39 -------------------------------------------------------------------------------- HPI Details Patient Name: Date of Service: [name] Long, [name] Long 09/20/2022 8:15 A M Medical Record Number: [id number] Patient Account Number: [id number] Date of Birth/Sex: Treating RN: [birth year] (82 y.o. F) Primary Care Provider: PA TIENT, NO Other Clinician: Referring Provider: Treating Provider/Extender: Sherryl Manges in Treatment: 2 History of Present Illness HPI Description: ADMISSION 09/06/2022 This is an 82 year old type II diabetic (no A1c to review and patient is unaware of her last value) with a history of atrial fibrillation on chronic anticoagulant therapy, lower extremity edema, congestive heart failure, and stage III kidney disease. She divides her time between her home in [place] and here in [place] where her daughter lives. While in [place], she struck her leg on her bed frame and developed a large hematoma. She was evaluated in the emergency department there and imaging was negative for any deeper injury, such as a fracture. She returned to [facility] and has had an urgent care and emergency department visit to evaluate the site. As there had been no significant improvement in her wound, she was referred to the wound care center for further evaluation and management. I am unable to see any evidence of an attempt to evacuate the hematoma in the electronic medical  record.. There are 2 openings in her skin, but they connect to each other with significant tunneling and undermining. I am unsure if these opened spontaneously or there was a non- documented attempt to drain the hematoma site. The majority of her left lower leg from below the tibial tuberosity to just above the ankle and from the anterior tibia wrapping laterally and posteriorly to nearly the medial  aspect of her leg is involved. There is a foul odor coming from her wound. There is no frank pus but there is significant retained clot that has likely served as culture media. ABI in clinic today was 0.84. 09/08/2022: The patient came in today as an add-on due to bloody drainage that was saturating her dressing. On inspection, it appeared that there was no frank fresh blood and no active site of bleeding identified; it appears that more of the hematoma was liquefied and draining from her wound. 09/13/2022: The wound has cleaned up considerably. There is no longer any foul odor and the drainage has decreased significantly. The patient is also complaining of less pain. There is still some nonviable subcutaneous tissue and fat present but overall everything is improved from the last visit. Her culture grew out a polymicrobial population of multiple drug-resistant organisms. She is currently taking Augmentin and levofloxacin as suggested by the culture data. 09/20/2022: The cavity is contracting nicely. There is no longer a connection between the posterior wound and the lateral leg wound. The posterior wound has a little bit of slough on the surface but the larger main wound is very clean. She has been approved for a wound VAC and we will apply that today. Electronic Signature(s) Signed: 09/20/2022 9:13:52 AM By: Duanne Guess MD FACS Previous Signature: 09/20/2022 9:13:10 AM Version By: Duanne Guess MD FACS Entered By: Duanne Guess on 09/20/2022  09:13:51 -------------------------------------------------------------------------------- Physical Exam Details Patient Name: Date of Service: [name] Long, [name] Long 09/20/2022 8:15 A M Medical Record Number: [id number] Patient Account Number: [id number] Date of Birth/Sex: Treating RN: 05-06-1940 (82 y.o. F) Primary Care Provider: PA Zenovia Jordan, NO Other Clinician: Referring Provider: Treating Provider/Extender: Sherryl Manges in Treatment: 2 Constitutional . . . . no acute distress. [place], [place] ([id number]) [id number] Page 3 of 8 Respiratory Normal work of breathing on room air. Notes 09/20/2022: The cavity is contracting nicely. There is no longer a connection between the posterior wound and the lateral leg wound. The posterior wound has a little bit of slough on the surface but the larger main wound is very clean. Electronic Signature(s) Signed: 09/20/2022 9:58:54 AM By: Duanne Guess MD FACS Previous Signature: 09/20/2022 9:14:20 AM Version By: Duanne Guess MD FACS Entered By: Duanne Guess on 09/20/2022 09:58:54 -------------------------------------------------------------------------------- Physician Orders Details Patient Name: Date of Service: [name] Long, [name] Long 09/20/2022 8:15 A M Medical Record Number: [id number] Patient Account Number: [id number] Date of Birth/Sex: Treating RN: [birth year] (82 y.o. [name] Provider: PA Zenovia Jordan, [place] Other Clinician: Referring Provider: Treating Provider/Extender: Sherryl Manges in Treatment: 2 Verbal / Phone Orders: No Diagnosis Coding ICD-10 Coding Code Description [phone] 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 Z79.01 Long term (current) use of anticoagulants I50.42 Chronic combined systolic (congestive) and diastolic (congestive) heart failure N18.30 Chronic kidney disease, stage 3  unspecified Follow-up Appointments ppointment in 1 week. - Dr. Lady Gary - Return A room 2 7/22 at 9:15 AM Nurse Visit: - 7/12 at 8:00 AM room 3 7/25 at 11:30 AM room 2 Anesthetic (In clinic) Topical Lidocaine 4% applied to wound bed Bathing/ Shower/ Hygiene May shower with protection but do not get wound dressing(s) wet. Protect dressing(s) with water repellant cover (for example, large plastic bag) or a cast cover and may then take shower. Negative Presssure Wound Therapy Medela Wound Vac continuously at 141mm/hg Black Foam Edema Control - Lymphedema / SCD /  Other Elevate legs to the level of the heart or above for 30 minutes daily and/or when sitting for 3-4 times a day throughout the day. Avoid standing for long periods of time. Exercise regularly Home Health Wound #1 Left,Lateral Lower Leg Admit to Home Health for skilled nursing wound care. May utilize formulary equivalent dressing for wound treatment orders unless otherwise specified. Dressing changes to be completed by Home Health on Monday / Wednesday / Friday except when patient has scheduled visit at [facility]. Wound Treatment Wound #1 - Lower Leg Wound Laterality: Left, Lateral Cleanser: Soap and Water 2 x Per Week/30 Days Discharge Instructions: May shower and wash wound with dial antibacterial soap and water prior to dressing change. Cleanser: Vashe 5.8 (oz) 2 x Per Week/30 Days [name] Long, [name] Long ([id number]) [id number] Page 4 of 8 Discharge Instructions: Cleanse the wound with Vashe prior to applying a clean dressing using gauze sponges, not tissue or cotton balls. Prim Dressing: NPWT ary 2 x Per Week/30 Days Compression Wrap: Kerlix Roll 4.5x3.1 (in/yd) 2 x Per Week/30 Days Discharge Instructions: Apply Kerlix and Coban compression as directed. Compression Wrap: Coban Self-Adherent Wrap 4x5 (in/yd) 2 x Per Week/30 Days Discharge Instructions: Apply over Kerlix as directed. Wound #3 -  Lower Leg Wound Laterality: Left, Posterior Prim Dressing: Maxorb Extra Ag+ Alginate Dressing, 2x2 (in/in) 2 x Per Week/30 Days ary Discharge Instructions: Apply to wound bed as instructed Secondary Dressing: Woven Gauze Sponge, Non-Sterile 4x4 in 2 x Per Week/30 Days Discharge Instructions: Apply over primary dressing as directed. Compression Wrap: Kerlix Roll 4.5x3.1 (in/yd) 2 x Per Week/30 Days Discharge Instructions: Apply Kerlix and Coban compression as directed. Compression Wrap: Coban Self-Adherent Wrap 4x5 (in/yd) 2 x Per Week/30 Days Discharge Instructions: Apply over Kerlix as directed. Electronic Signature(s) Signed: 09/20/2022 12:38:39 PM By: Duanne Guess MD FACS Entered By: Duanne Guess on 09/20/2022 09:59:11 -------------------------------------------------------------------------------- Problem List Details Patient Name: Date of Service: [name] Long, [name] Long 09/20/2022 8:15 A M Medical Record Number: [id number] Patient Account Number: [id number] Date of Birth/Sex: Treating RN: 28-Nov-1940 (82 y.o. [name] Provider: PA Zenovia Jordan, [place] Other Clinician: Referring Provider: Treating Provider/Extender: Sherryl Manges in Treatment: 2 Active Problems ICD-10 Encounter Code Description Active Date MDM Diagnosis L97.822 Non-pressure chronic ulcer of other part of left lower leg with fat layer exposed7/05/2022 No Yes E11.622 Type 2 diabetes mellitus with other skin ulcer 09/06/2022 No Yes Z79.01 Long term (current) use of anticoagulants 09/06/2022 No Yes I50.42 Chronic combined systolic (congestive) and diastolic (congestive) heart failure 09/06/2022 No Yes N18.30 Chronic kidney disease, stage 3 unspecified 09/06/2022 No Yes Inactive Problems EFTHIMIA, MARSALA ([id number]) [id number] Page 5 of 8 Resolved Problems Electronic Signature(s) Signed: 09/20/2022 9:11:43 AM By: Duanne Guess MD FACS Entered By: Duanne Guess on 09/20/2022  09:11:43 -------------------------------------------------------------------------------- Progress Note Details Patient Name: Date of Service: [name] Long, [name] Long 09/20/2022 8:15 A M Medical Record Number: [id number] Patient Account Number: [id number] Date of Birth/Sex: Treating RN: [birth year] (82 y.o. F) Primary Care Provider: PA Zenovia Jordan, NO Other Clinician: Referring Provider: Treating Provider/Extender: Sherryl Manges in Treatment: 2 Subjective Chief Complaint Information obtained from Patient Patient seen for complaints of Non-Healing Wound. History of Present Illness (HPI) ADMISSION 09/06/2022 This is an 82 year old type II diabetic (no A1c to review and patient is unaware of her last value) with a history of atrial fibrillation on chronic anticoagulant therapy, lower extremity edema, congestive heart failure, and stage III kidney disease. She divides her time between her home in [place] and  here in [place] where her daughter lives. While in [place], she struck her leg on her bed frame and developed a large hematoma. She was evaluated in the emergency department there and imaging was negative for any deeper injury, such as a fracture. She returned to [facility] and has had an urgent care and emergency department visit to evaluate the site. As there had been no significant improvement in her wound, she was referred to the wound care center for further evaluation and management. I am unable to see any evidence of an attempt to evacuate the hematoma in the electronic medical record.. There are 2 openings in her skin, but they connect to each other with significant tunneling and undermining. I am unsure if these opened spontaneously or there was a non- documented attempt to drain the hematoma site. The majority of her left lower leg from below the tibial tuberosity to just above the ankle and from the anterior tibia wrapping laterally and posteriorly to nearly the medial aspect of her  leg is involved. There is a foul odor coming from her wound. There is no frank pus but there is significant retained clot that has likely served as culture media. ABI in clinic today was 0.84. 09/08/2022: The patient came in today as an add-on due to bloody drainage that was saturating her dressing. On inspection, it appeared that there was no frank fresh blood and no active site of bleeding identified; it appears that more of the hematoma was liquefied and draining from her wound. 09/13/2022: The wound has cleaned up considerably. There is no longer any foul odor and the drainage has decreased significantly. The patient is also complaining of less pain. There is still some nonviable subcutaneous tissue and fat present but overall everything is improved from the last visit. Her culture grew out a polymicrobial population of multiple drug-resistant organisms. She is currently taking Augmentin and levofloxacin as suggested by the culture data. 09/20/2022: The cavity is contracting nicely. There is no longer a connection between the posterior wound and the lateral leg wound. The posterior wound has a little bit of slough on the surface but the larger main wound is very clean. She has been approved for a wound VAC and we will apply that today. Patient History Information obtained from Patient. Family History No family history of Hereditary Spherocytosis, Hypertension, Kidney Disease, Lung Disease, Seizures, Stroke, Thyroid Problems, Tuberculosis. Social History Never smoker, Marital Status - Widowed, Alcohol Use - Never, Drug Use - No History, Caffeine Use - Never. Medical History Cardiovascular Patient has history of Arrhythmia - A fib, Congestive Heart Failure, Hypertension Endocrine Patient has history of Type II Diabetes - since 2019 Medical A Surgical History Notes nd Cardiovascular High Cholesterol Genitourinary Kidney Disease [name] Long, [name] Long ([id number]) [id number]  Page 6 of 8 Objective Constitutional no acute distress. Vitals Time Taken: 8:30 AM, Height: 64 in, Weight: 162 lbs, BMI: 27.8, Temperature: 98.0 F, Pulse: 69 bpm, Respiratory Rate: 18 breaths/min, Blood Pressure: 121/69 mmHg, Capillary Blood Glucose: 129 mg/dl. General Notes: glucose per pt report this am Respiratory Normal work of breathing on room air. General Notes: 09/20/2022: The cavity is contracting nicely. There is no longer a connection between the posterior wound and the lateral leg wound. The posterior wound has a little bit of slough on the surface but the larger main wound is very clean. Integumentary (Hair, Skin) Wound #1 status is Open. Original cause of wound was Hematoma. The date acquired was: 08/05/2022. The wound has been in treatment  2 weeks. The wound is located on the Left,Lateral Lower Leg. The wound measures 4.5cm length x 2.1cm width x 2.1cm depth; 7.422cm^2 area and 15.586cm^3 volume. There is Fat Layer (Subcutaneous Tissue) exposed. There is no tunneling noted, however, there is undermining starting at 5:00 and ending at 12:00 with a maximum distance of 3.2cm. There is a large amount of serosanguineous drainage noted. The wound margin is well defined and not attached to the wound base. There is large (67-100%) red granulation within the wound bed. There is a small (1-33%) amount of necrotic tissue within the wound bed including Adherent Slough. The periwound skin appearance had no abnormalities noted for texture. The periwound skin appearance had no abnormalities noted for moisture. The periwound skin appearance exhibited: Hemosiderin Staining. Periwound temperature was noted as No Abnormality. Wound #3 status is Open. Original cause of wound was Trauma. The date acquired was: 09/20/2022. The wound is located on the Left,Posterior Lower Leg. The wound measures 2.4cm length x 2.3cm width x 0.1cm depth; 4.335cm^2 area and 0.434cm^3 volume. There is Fat Layer (Subcutaneous  Tissue) exposed. There is no undermining noted, however, there is tunneling at 9:00 with a maximum distance of 3.9cm. There is a medium amount of serosanguineous drainage noted. The wound margin is flat and intact. There is large (67-100%) red granulation within the wound bed. There is no necrotic tissue within the wound bed. The periwound skin appearance had no abnormalities noted for texture. The periwound skin appearance had no abnormalities noted for moisture. The periwound skin appearance exhibited: Hemosiderin Staining. Periwound temperature was noted as No Abnormality. Assessment Active Problems ICD-10 Non-pressure chronic ulcer of other part of left lower leg with fat layer exposed Type 2 diabetes mellitus with other skin ulcer Long term (current) use of anticoagulants Chronic combined systolic (congestive) and diastolic (congestive) heart failure Chronic kidney disease, stage 3 unspecified Procedures Wound #3 Pre-procedure diagnosis of Wound #3 is an Infection - not elsewhere classified located on the Left,Posterior Lower Leg . There was a Selective/Open Wound Non-Viable Tissue Debridement with a total area of 4.33 sq cm performed by Duanne Guess, MD. With the following instrument(s): Curette to remove Non- Viable tissue/material. Material removed includes [facility] after achieving pain control using Lidocaine 4% Topical Solution. No specimens were taken. A time out was conducted at 09:05, prior to the start of the procedure. A Minimum amount of bleeding was controlled with Pressure. The procedure was tolerated well with a pain level of 0 throughout and a pain level of 0 following the procedure. Post Debridement Measurements: 2.4cm length x 2.3cm width x 0.1cm depth; 0.434cm^3 volume. Character of Wound/Ulcer Post Debridement is improved. Post procedure Diagnosis Wound #3: Same as Pre-Procedure General Notes: scribed for Dr. Lady Gary by [name], RN. Plan Follow-up  Appointments: Return Appointment in 1 week. - Dr. Lady Gary - room 2 7/22 at 9:15 AM Nurse Visit: - 7/12 at 8:00 AM room 3 7/25 at 11:30 AM room 2 Anesthetic: (In clinic) Topical Lidocaine 4% applied to wound bed Bathing/ Shower/ Hygiene: May shower with protection but do not get wound dressing(s) wet. Protect dressing(s) with water repellant cover (for example, large plastic bag) or a cast cover and may then take shower. Negative Presssure Wound Therapy: Medela Wound Vac continuously at 171mm/hg Black Foam Edema Control - Lymphedema / SCD / Other: Elevate legs to the level of the heart or above for 30 minutes daily and/or when sitting for 3-4 times a day throughout the day. Avoid standing for long periods  of time. Exercise regularly [name] Long, [name] Long ([id number]) [id number] Page 7 of 8 Home Health: Wound #1 Left,Lateral Lower Leg: Admit to Home Health for skilled nursing wound care. May utilize formulary equivalent dressing for wound treatment orders unless otherwise specified. Dressing changes to be completed by Home Health on Monday / Wednesday / Friday except when patient has scheduled visit at [facility]. WOUND #1: - Lower Leg Wound Laterality: Left, Lateral Cleanser: Soap and Water 2 x Per Week/30 Days Discharge Instructions: May shower and wash wound with dial antibacterial soap and water prior to dressing change. Cleanser: Vashe 5.8 (oz) 2 x Per Week/30 Days Discharge Instructions: Cleanse the wound with Vashe prior to applying a clean dressing using gauze sponges, not tissue or cotton balls. Prim Dressing: NPWT 2 x Per Week/30 Days ary Com pression Wrap: Kerlix Roll 4.5x3.1 (in/yd) 2 x Per Week/30 Days Discharge Instructions: Apply Kerlix and Coban compression as directed. Com pression Wrap: Coban Self-Adherent Wrap 4x5 (in/yd) 2 x Per Week/30 Days Discharge Instructions: Apply over Kerlix as directed. WOUND #3: - Lower Leg Wound Laterality: Left,  Posterior Prim Dressing: Maxorb Extra Ag+ Alginate Dressing, 2x2 (in/in) 2 x Per Week/30 Days ary Discharge Instructions: Apply to wound bed as instructed Secondary Dressing: Woven Gauze Sponge, Non-Sterile 4x4 in 2 x Per Week/30 Days Discharge Instructions: Apply over primary dressing as directed. Com pression Wrap: Kerlix Roll 4.5x3.1 (in/yd) 2 x Per Week/30 Days Discharge Instructions: Apply Kerlix and Coban compression as directed. Com pression Wrap: Coban Self-Adherent Wrap 4x5 (in/yd) 2 x Per Week/30 Days Discharge Instructions: Apply over Kerlix as directed. 09/20/2022: The cavity is contracting nicely. There is no longer a connection between the posterior wound and the lateral leg wound. The posterior wound has a little bit of slough on the surface but the larger main wound is very clean The main cavity and wound did not require any debridement. I used a curette to debride slough off of the posterior leg wound. Will apply silver alginate to this site and use negative pressure wound therapy on the larger wound. She will have a nurse visit to have her VAC changed between visits and follow-up with me in a week. She is just now completing her course of oral antibiotics and has only a day or so left. Electronic Signature(s) Signed: 09/20/2022 10:00:37 AM By: Duanne Guess MD FACS Entered By: Duanne Guess on 09/20/2022 10:00:36 -------------------------------------------------------------------------------- HxROS Details Patient Name: Date of Service: [name] Long, [name] Long 09/20/2022 8:15 A M Medical Record Number: [id number] Patient Account Number: [id number] Date of Birth/Sex: Treating RN: 02-Jan-1941 (82 y.o. F) Primary Care Provider: PA Zenovia Jordan, NO Other Clinician: Referring Provider: Treating Provider/Extender: Sherryl Manges in Treatment: 2 Information Obtained From Patient Cardiovascular Medical History: Positive for: Arrhythmia - A fib; Congestive Heart Failure;  Hypertension Past Medical History Notes: High Cholesterol Endocrine Medical History: Positive for: Type II Diabetes - since 2019 Genitourinary Medical History: Past Medical History Notes: Kidney Disease Immunizations Pneumococcal VaccineMAKALIA, [name] Long ([id number]) [id number] Page 8 of 8 Received Pneumococcal Vaccination: No Implantable Devices No devices added Family and Social History Hereditary Spherocytosis: No; Hypertension: No; Kidney Disease: No; Lung Disease: No; Seizures: No; Stroke: No; Thyroid Problems: No; Tuberculosis: No; Never smoker; Marital Status - Widowed; Alcohol Use: Never; Drug Use: No History; Caffeine Use: Never; Financial Concerns: No; Food, Clothing or Shelter Needs: No; Support System Lacking: No; Transportation Concerns: No Electronic Signature(s) Signed: 09/20/2022 12:38:39 PM By: Duanne Guess MD FACS Entered By: Duanne Guess on 09/20/2022 09:57:39 --------------------------------------------------------------------------------  SuperBill Details Patient Name: Date of Service: CALINA, SUTERA 09/20/2022 Medical Record Number: [id number] Patient Account Number: [id number] Date of Birth/Sex: Treating RN: [birth year] (82 y.o. 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 Z79.01 Long term (current) use of anticoagulants I50.42 Chronic combined systolic (congestive) and diastolic (congestive) heart failure N18.30 Chronic kidney disease, stage 3 unspecified Facility Procedures : CPT4 Code: [id number] Description: 97597 - DEBRIDE WOUND 1ST 20 SQ CM OR < ICD-10 Diagnosis Description L97.822 Non-pressure chronic ulcer of other  part of left lower leg with fat layer expose Modifier: d Quantity: 1 : CPT4 Code: [id number] Description: 78469 - WOUND VAC-50 SQ CM OR LESS Modifier: 59 Quantity: 1 Physician Procedures : CPT4 Code Description Modifier [id number] 99214 - WC PHYS LEVEL 4 - EST PT 25 ICD-10 Diagnosis Description L97.822 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

## 2022-09-20 NOTE — Progress Notes (Signed)
[place], [place] ([id number]) [id number] Page 1 of 1 Visit Report for 09/15/2022 SuperBill Details Patient Name: Date of Service: [name], [name] Long 09/15/2022 Medical Record Number: [id number] Patient Account Number: [id number] Date of Birth/Sex: Treating RN: November 09, 1940 (82 y.o. 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

## 2022-09-20 NOTE — Progress Notes (Signed)
[place], [place] ([id number]) [id number] Page 1 of 4 Visit Report for 09/06/2022 Abuse Risk Screen Details Patient Name: Date of Service: [name] Long, [name] Long 09/06/2022 8:00 A M Medical Record Number: [id number] Patient Account Number: [id number] Date of Birth/Sex: Treating RN: Aug 22, 1940 (82 y.o. Gevena Mart Primary Care Zeidy Tayag: PA Zenovia Jordan, [place] Other Clinician: Referring Aubreana Cornacchia: Treating Holbert Caples/Extender: Sherryl Manges in Treatment: 0 Abuse Risk Screen Items Answer ABUSE RISK SCREEN: Has anyone close to you tried to hurt or harm you recentlyo No Do you feel uncomfortable with anyone in your familyo No Has anyone forced you do things that you didnt want to doo No Electronic Signature(s) Signed: 09/20/2022 7:59:22 AM By: Brenton Grills Entered By: Brenton Grills on 09/06/2022 08:00:55 -------------------------------------------------------------------------------- Activities of Daily Living Details Patient Name: Date of Service: [name] Long, [name] Long 09/06/2022 8:00 A M Medical Record Number: [id number] Patient Account Number: [id number] Date of Birth/Sex: Treating RN: 29-Dec-1940 (82 y.o. Gevena Mart Primary Care Dorothey Oetken: PA Zenovia Jordan, [place] Other Clinician: Referring Azaela Caracci: Treating Retta Pitcher/Extender: Sherryl Manges in Treatment: 0 Activities of Daily Living Items Answer Activities of Daily Living (Please select one for each item) Drive Automobile Completely Able T Medications ake Completely Able Use T elephone Completely Able Care for Appearance Completely Able Use T oilet Completely Able Bath / Shower Completely Able Dress Self Completely Able Feed Self Completely Able Walk Completely Able Get In / Out Bed Completely Able Housework Completely Able Prepare Meals Completely Able Handle Money Completely Able Shop for Self Completely Able Electronic Signature(s) Signed: 09/20/2022 7:59:22 AM By: Brenton Grills Entered By: Brenton Grills on 09/06/2022 08:01:45 [place], [name] ([id number]) [id number] Page 2 of 4 -------------------------------------------------------------------------------- Education Screening Details Patient Name: Date of Service: [name] Long, [name] Long 09/06/2022 8:00 A M Medical Record Number: [id number] Patient Account Number: [id number] Date of Birth/Sex: Treating RN: 09/28/1940 (82 y.o. Gevena Mart Primary Care Jhovany Weidinger: PA Zenovia Jordan, NO Other Clinician: Referring Jadynn Epping: Treating Stancil Deisher/Extender: Sherryl Manges in Treatment: 0 Learning Preferences/Education Level/Primary Language Highest Education Level: High School Preferred Language: English Cognitive Barrier Language Barrier: No Translator Needed: No Memory Deficit: No Emotional Barrier: No Cultural/Religious Beliefs Affecting Medical Care: No Physical Barrier Impaired Vision: Yes Glasses Impaired Hearing: No Decreased Hand dexterity: No Knowledge/Comprehension Knowledge Level: Medium Comprehension Level: Medium Ability to understand written instructions: Medium Ability to understand verbal instructions: Medium Motivation Anxiety Level: Calm Cooperation: Cooperative Education Importance: Acknowledges Need Interest in Health Problems: Asks Questions Perception: Coherent Willingness to Engage in Self-Management High Activities: Readiness to Engage in Self-Management High Activities: Electronic Signature(s) Signed: 09/20/2022 7:59:22 AM By: Brenton Grills Entered By: Brenton Grills on 09/06/2022 08:02:27 -------------------------------------------------------------------------------- Fall Risk Assessment Details Patient Name: Date of Service: [name] Long, [name] Long 09/06/2022 8:00 A M Medical Record Number: [id number] Patient Account Number: [id number] Date of Birth/Sex: Treating RN: 1940/06/10 (82 y.o. Gevena Mart Primary Care Jveon Pound: PA TIENT, NO Other Clinician: Referring  Jaideep Pollack: Treating Tatum Massman/Extender: Sherryl Manges in Treatment: 0 Fall Risk Assessment Items Have you had 2 or more falls in the last 12 monthso 0 No Have you had any fall that resulted in injury in the last 12 monthso 0 No FALLS RISK SCREEN [place], [place] ([id number]) [id number] Page 3 of 4 History of falling - immediate or within 3 months 0 No Secondary diagnosis (Do you have 2 or more medical diagnoseso) 0 No Ambulatory aid None/bed rest/wheelchair/nurse 0 No Crutches/cane/walker 0 No Furniture 0 No Intravenous therapy Access/Saline/Heparin Lock 0 No Gait/Transferring Normal/ bed rest/ wheelchair 0 No  Weak (short steps with or without shuffle, stooped but able to lift head while walking, may seek 0 No support from furniture) Impaired (short steps with shuffle, may have difficulty arising from chair, head down, impaired 0 No balance) Mental Status Oriented to own ability 0 No Electronic Signature(s) Signed: 09/20/2022 7:59:22 AM By: Brenton Grills Entered By: Brenton Grills on 09/06/2022 08:02:37 -------------------------------------------------------------------------------- Foot Assessment Details Patient Name: Date of Service: [name] Long, [name] Long 09/06/2022 8:00 A M Medical Record Number: [id number] Patient Account Number: [id number] Date of Birth/Sex: Treating RN: 11/20/1940 (82 y.o. Gevena Mart Primary Care Bina Veenstra: PA TIENT, NO Other Clinician: Referring Tedrick Port: Treating Haset O[place]aca/Extender: Sherryl Manges in Treatment: 0 Foot Assessment Items Site Locations + = Sensation present, - = Sensation absent, C = Callus, U = Ulcer R = Redness, W = Warmth, M = Maceration, PU = Pre-ulcerative lesion F = Fissure, S = Swelling, D = Dryness Assessment Right: Left: Other Deformity: No No Prior Foot Ulcer: No No Prior Amputation: No No Charcot Joint: No No Ambulatory Status: Ambulatory Without Help GaitGerene Nedd, [place]  ([id number]) [id number] Page 4 of 4 Electronic Signature(s) Signed: 09/20/2022 7:59:22 AM By: Brenton Grills Entered By: Brenton Grills on 09/06/2022 08:05:51 -------------------------------------------------------------------------------- Nutrition Risk Screening Details Patient Name: Date of Service: [name] Long, [name] Long 09/06/2022 8:00 A M Medical Record Number: [id number] Patient Account Number: [id number] Date of Birth/Sex: Treating RN: 17-Dec-1940 (82 y.o. Gevena Mart Primary Care Jesselle Laflamme: PA Zenovia Jordan, NO Other Clinician: Referring Eliseo Withers: Treating Abella Shugart/Extender: Sherryl Manges in Treatment: 0 Height (in): Weight (lbs): Body Mass Index (BMI): Nutrition Risk Screening Items Score Screening NUTRITION RISK SCREEN: I have an illness or condition that made me change the kind and/or amount of food I eat 0 No I eat fewer than two meals per day 0 No I eat few fruits and vegetables, or milk products 0 No I have three or more drinks of beer, liquor or wine almost every day 0 No I have tooth or mouth problems that make it hard for me to eat 0 No I don't always have enough money to buy the food I need 0 No I eat alone most of the time 0 No I take three or more different prescribed or over-the-counter drugs a day 0 No Without wanting to, I have lost or gained 10 pounds in the last six months 0 No I am not always physically able to shop, cook and/or feed myself 0 No Nutrition Protocols Good Risk Protocol Moderate Risk Protocol High Risk Proctocol Risk Level: Good Risk Score: 0 Electronic Signature(s) Signed: 09/20/2022 7:59:22 AM By: Brenton Grills Entered By: Brenton Grills on 09/06/2022 08:02:43

## 2022-09-20 NOTE — Progress Notes (Signed)
[place], [place] ([id number]) [id number] Page 1 of 9 Visit Report for 09/06/2022 Allergy List Details Patient Name: Date of Service: [name] Long, [name] Long 09/06/2022 8:00 A M Medical Record Number: [id number] Patient Account Number: [id number] Date of Birth/Sex: Treating RN: February 11, 1941 (82 y.o. Gevena Mart Primary Care Maham Quintin: PA Zenovia Jordan, NO Other Clinician: Referring Leeloo Silverthorne: Treating Meagen Limones/Extender: Sherryl Manges in Treatment: 0 Allergies Active Allergies No Known Allergies Allergy Notes Electronic Signature(s) Signed: 09/20/2022 7:59:22 AM By: Brenton Grills Entered By: Brenton Grills on 09/06/2022 07:50:39 -------------------------------------------------------------------------------- Arrival Information Details Patient Name: Date of Service: [name] Long, [name] Long 09/06/2022 8:00 A M Medical Record Number: [id number] Patient Account Number: [id number] Date of Birth/Sex: Treating RN: December 04, 1940 (82 y.o. Gevena Mart Primary Care Chancy Claros: PA Zenovia Jordan, NO Other Clinician: Referring Amardeep Beckers: Treating Prithvi Kooi/Extender: Sherryl Manges in Treatment: 0 Visit Information Patient Arrived: Wheel Chair Arrival Time: 07:56 Accompanied By: daughter Transfer Assistance: None Patient Identification Verified: Yes Secondary Verification Process Completed: Yes Patient Requires Transmission-Based Precautions: No Patient Has Alerts: No Electronic Signature(s) Signed: 09/20/2022 7:59:22 AM By: Brenton Grills Entered By: Brenton Grills on 09/06/2022 07:57:20 -------------------------------------------------------------------------------- Clinic Level of Care Assessment Details Patient Name: Date of Service: [name] Long, [name] Long 09/06/2022 8:00 A M Medical Record Number: [id number] Patient Account Number: [id number] KAYLINA, CAHUE ([id number]) [id number] Page 2 of 9 Date of Birth/Sex: Treating RN: [birth year] (82 y.o. Gevena Mart Primary Care  Cordney Barstow: PA Zenovia Jordan, NO Other Clinician: Referring Oziah Vitanza: Treating Frida Wahlstrom/Extender: Sherryl Manges in Treatment: 0 Clinic Level of Care Assessment Items TOOL 1 Quantity Score X- 1 0 Use when EandM and Procedure is performed on INITIAL visit ASSESSMENTS - Nursing Assessment / Reassessment X- 1 20 General Physical Exam (combine w/ comprehensive assessment (listed just below) when performed on new pt. evals) X- 1 25 Comprehensive Assessment (HX, ROS, Risk Assessments, Wounds Hx, etc.) ASSESSMENTS - Wound and Skin Assessment / Reassessment X- 1 10 Dermatologic / Skin Assessment (not related to wound area) ASSESSMENTS - Ostomy and/or Continence Assessment and Care []  - 0 Incontinence Assessment and Management []  - 0 Ostomy Care Assessment and Management (repouching, etc.) PROCESS - Coordination of Care []  - 0 Simple Patient / Family Education for ongoing care X- 1 20 Complex (extensive) Patient / Family Education for ongoing care X- 1 10 Staff obtains Chiropractor, Records, T Results / Process Orders est []  - 0 Staff telephones HHA, Nursing Homes / Clarify orders / etc []  - 0 Routine Transfer to another Facility (non-emergent condition) []  - 0 Routine Hospital Admission (non-emergent condition) []  - 0 New Admissions / Manufacturing engineer / Ordering NPWT Apligraf, etc. , []  - 0 Emergency Hospital Admission (emergent condition) PROCESS - Special Needs []  - 0 Pediatric / Minor Patient Management []  - 0 Isolation Patient Management []  - 0 Hearing / Language / Visual special needs []  - 0 Assessment of Community assistance (transportation, D/C planning, etc.) []  - 0 Additional assistance / Altered mentation []  - 0 Support Surface(s) Assessment (bed, cushion, seat, etc.) INTERVENTIONS - Miscellaneous []  - 0 External ear exam []  - 0 Patient Transfer (multiple staff / Nurse, adult / Similar devices) []  - 0 Simple Staple / Suture removal (25 or less) []  -  0 Complex Staple / Suture removal (26 or more) X- 1 10 Hypo/Hyperglycemic Management (do not check if billed separately) X- 1 15 Ankle / Brachial Index (ABI) - do not check if billed separately Has the patient been seen at the hospital within the last three years: Yes Total Score: 110 Level  Of Care: New/Established - Level 3 Electronic Signature(s) Signed: 09/20/2022 7:59:22 AM By: Brenton Grills Entered By: Brenton Grills on 09/06/2022 09:48:30 [place], Alizza ([id number]) [id number] Page 3 of 9 -------------------------------------------------------------------------------- Encounter Discharge Information Details Patient Name: Date of Service: [name] Long, [name] Long 09/06/2022 8:00 A M Medical Record Number: [id number] Patient Account Number: [id number] Date of Birth/Sex: Treating RN: [birth year] (81 y.o. Gevena Mart Primary Care Xavyer Steenson: PA Zenovia Jordan, NO Other Clinician: Referring Detron Carras: Treating Delton Stelle/Extender: Sherryl Manges in Treatment: 0 Encounter Discharge Information Items Post Procedure Vitals Discharge Condition: Stable Temperature (F): 98.4 Ambulatory Status: Ambulatory Pulse (bpm): 80 Discharge Destination: Home Respiratory Rate (breaths/min): 18 Transportation: Private Auto Blood Pressure (mmHg): 156/72 Accompanied By: daughter Schedule Follow-up Appointment: Yes Clinical Summary of Care: Patient Declined Electronic Signature(s) Signed: 09/20/2022 7:59:22 AM By: Brenton Grills Entered By: Brenton Grills on 09/06/2022 09:59:25 -------------------------------------------------------------------------------- Lower Extremity Assessment Details Patient Name: Date of Service: [name] Long, [name] Long 09/06/2022 8:00 A M Medical Record Number: [id number] Patient Account Number: [id number] Date of Birth/Sex: Treating RN: October 03, 1940 (82 y.o. Gevena Mart Primary Care Girtha Kilgore: PA Zenovia Jordan, NO Other Clinician: Referring Zariah Cavendish: Treating  Jonty Morrical/Extender: Sherryl Manges in Treatment: 0 Edema Assessment Assessed: [Left: No] [Right: No] [Left: Edema] [Right: :] Calf Left: Right: Point of Measurement: From Medial Instep 44.5 cm 35.3 cm Ankle Left: Right: Point of Measurement: From Medial Instep 33 cm 26 cm Vascular Assessment Pulses: Dorsalis Pedis Palpable: [Left:Yes] [Right:Yes] Blood Pressure: Brachial: [Left:160] Ankle: [Left:Dorsalis Pedis: 135 0.84] Electronic Signature(s) Signed: 09/20/2022 7:59:22 AM By: Brenton Grills Entered By: Brenton Grills on 09/06/2022 08:24:07 Antony Odea ([id number]) [id number] Page 4 of 9 -------------------------------------------------------------------------------- Multi Wound Chart Details Patient Name: Date of Service: [name] Long, [name] Long 09/06/2022 8:00 A M Medical Record Number: [id number] Patient Account Number: [id number] Date of Birth/Sex: Treating RN: Jul 05, 1940 (82 y.o. F) Primary Care Vivan Vanderveer: PA TIENT, NO Other Clinician: Referring Myana Schlup: Treating Maryse Brierley/Extender: Sherryl Manges in Treatment: 0 Vital Signs Height(in): Capillary Blood Glucose(mg/dl): 244 Weight(lbs): Pulse(bpm): 78 Body Mass Index(BMI): Blood Pressure(mmHg): 160/87 Temperature(F): 98.5 Respiratory Rate(breaths/min): 18 [1:Photos:] [2:No Photos] [N/A:N/A] Left, Lateral Lower Leg Left, Posterior Lower Leg N/A Wound Location: Trauma Trauma N/A Wounding Event: Diabetic Wound/Ulcer of the Lower Diabetic Wound/Ulcer of the Lower N/A Primary Etiology: Extremity Extremity Arrhythmia, Congestive Heart Failure, N/A N/A Comorbid History: Hypertension, Type II Diabetes 08/05/2022 08/05/2022 N/A Date A cquired: 0 0 N/A Weeks of Treatment: Open Converted N/A Wound Status: No No N/A Wound Recurrence: 9x5x0.9 0x0x0 N/A Measurements L x W x D (cm) 35.343 0 N/A A (cm) : rea 31.809 0 N/A Volume (cm) : 12 Position 1 (o'clock): 3 Maximum Distance 1  (cm): 9 Position 2 (o'clock): 8 Maximum Distance 2 (cm): Yes N/A N/A Tunneling: Grade 2 N/A N/A Classification: Medium N/A N/A Exudate A mount: Serosanguineous N/A N/A Exudate Type: red, brown N/A N/A Exudate Color: Yes N/A N/A Foul Odor A Cleansing: fter No N/A N/A Odor A nticipated Due to Product Use: Distinct, outline attached N/A N/A Wound Margin: Medium (34-66%) N/A N/A Granulation A mount: Red N/A N/A Granulation Quality: Medium (34-66%) N/A N/A Necrotic A mount: Eschar N/A N/A Necrotic Tissue: Fat Layer (Subcutaneous Tissue): Yes N/A N/A Exposed Structures: Debridement - Selective/Open Wound N/A N/A Debridement: 09:11 N/A N/A Pre-procedure Verification/Time Out Taken: Lidocaine 4% Topical Solution N/A N/A Pain Control: Blood Clots N/A N/A Tissue Debrided: Non-Viable Tissue N/A N/A Level: 35.33 N/A N/A Debridement A (sq cm): rea Curette N/A N/A Instrument: Large N/A N/A Bleeding: Pressure N/A N/A Hemostasis  A chieved: 5 N/A N/A Procedural Pain: 5 N/A N/A Post Procedural Pain: Procedure was tolerated well N/A N/A Debridement Treatment Response: 9x5x0.9 N/A N/A Post Debridement Measurements L x W x D (cm) 31.809 N/A N/A Post Debridement Volume: (cm) [name] Long, [name] Long ([id number]) [id number] Page 5 of 9 Rash: Yes No Abnormalities Noted N/A Periwound Skin Texture: Maceration: No No Abnormalities Noted N/A Periwound Skin Moisture: Dry/Scaly: No Hemosiderin Staining: Yes No Abnormalities Noted N/A Periwound Skin Color: No Abnormality N/A N/A Temperature: Yes N/A N/A Tenderness on Palpation: Debridement N/A N/A Procedures Performed: Treatment Notes Wound #1 (Lower Leg) Wound Laterality: Left, Lateral Cleanser Soap and Water Discharge Instruction: May shower and wash wound with dial antibacterial soap and water prior to dressing change. Vashe 5.8 (oz) Discharge Instruction: Cleanse the wound with Vashe prior to  applying a clean dressing using gauze sponges, not tissue or cotton balls. Dakin's Solution 0.125%, 16 (oz) Peri-Wound Care Topical Primary Dressing Secondary Dressing Bordered Gauze, 4x4 in Discharge Instruction: Apply over primary dressing as directed. Secured With [employer], 4.5x3.1 (in/yd) Discharge Instruction: Secure with Kerlix as directed. 87M Medipore [name] Long Soft Cloth Surgical T ape, 4 x 10 (in/yd) Discharge Instruction: Secure with tape as directed. Compression Wrap Compression Stockings Add-Ons Wound #2 (Lower Leg) Wound Laterality: Left, Posterior Cleanser Peri-Wound Care Topical Primary Dressing Secondary Dressing Secured With Compression Wrap Compression Stockings Add-Ons Electronic Signature(s) Signed: 09/06/2022 10:09:52 AM By: Duanne Guess MD FACS Entered By: Duanne Guess on 09/06/2022 10:09:52 -------------------------------------------------------------------------------- Multi-Disciplinary Care Plan Details Patient Name: Date of Service: [name] Long, Kiira 09/06/2022 8:00 A M Medical Record Number: [id number] Patient Account Number: [id number] Date of Birth/Sex: Treating RN: 16-Oct-1940 (82 y.o. Gevena Mart [place]on, Avelynn ([id number]) [id number] Page 6 of 9 Primary Care Bracha Frankowski: PA Zenovia Jordan, [place] Other Clinician: Referring Eyvette Cordon: Treating Nora Sabey/Extender: Sherryl Manges in Treatment: 0 Active Inactive Wound/Skin Impairment Nursing Diagnoses: Impaired tissue integrity Goals: Patient/caregiver will verbalize understanding of skin care regimen Date Initiated: 09/06/2022 Target Resolution Date: 11/04/2022 Goal Status: Active Interventions: Assess patient/caregiver ability to obtain necessary supplies Assess patient/caregiver ability to perform ulcer/skin care regimen upon admission and as needed Assess ulceration(s) every visit Provide education on ulcer and skin care Screen for HBO Treatment  Activities: Skin care regimen initiated : 09/06/2022 Topical wound management initiated : 09/06/2022 Notes: Electronic Signature(s) Signed: 09/20/2022 7:59:22 AM By: Brenton Grills Entered By: Brenton Grills on 09/06/2022 08:41:48 -------------------------------------------------------------------------------- Pain Assessment Details Patient Name: Date of Service: CHENIKA, NEVILS 09/06/2022 8:00 A M Medical Record Number: [id number] Patient Account Number: [id number] Date of Birth/Sex: Treating RN: [birth year] (82 y.o. Gevena Mart Primary Care Kayceon Oki: PA Zenovia Jordan, NO Other Clinician: Referring Aleta Manternach: Treating Davari Lopes/Extender: Sherryl Manges in Treatment: 0 Active Problems Location of Pain Severity and Description of Pain Patient Has Paino No Site Locations [place], [place] ([id number]) [id number] Page 7 of 9 Pain Management and Medication Current Pain Management: Electronic Signature(s) Signed: 09/20/2022 7:59:22 AM By: Brenton Grills Entered By: Brenton Grills on 09/06/2022 08:41:03 -------------------------------------------------------------------------------- Patient/Caregiver Education Details Patient Name: Date of Service: [name], Ronni 7/3/2024andnbsp8:00 A M Medical Record Number: [id number] Patient Account Number: [id number] Date of Birth/Gender: Treating RN: 1940-04-09 (82 y.o. Gevena Mart Primary Care Physician: PA Zenovia Jordan, [place] Other Clinician: Referring Physician: Treating Physician/Extender: Sherryl Manges in Treatment: 0 Education Assessment Education Provided To: Patient and Caregiver Education Topics Provided Wound/Skin Impairment: Methods: Explain/Verbal Responses: State content correctly Electronic Signature(s) Signed: 09/20/2022 7:59:22 AM By: Brenton Grills Entered By: Brenton Grills on 09/06/2022 08:42:04 -------------------------------------------------------------------------------- Wound  Assessment  Details Patient Name: Date of Service: ARTINA, MINELLA 09/06/2022 8:00 A M Medical Record Number: [id number] Patient Account Number: [id number] Date of Birth/Sex: Treating RN: 17-Feb-1941 (82 y.o. Gevena Mart Primary Care Yarithza Mink: PA Zenovia Jordan, NO Other Clinician: Referring Lajuan Godbee: Treating Kirsti Mcalpine/Extender: Sherryl Manges in Treatment: 0 Wound Status Wound Number: 1 Primary Diabetic Wound/Ulcer of the Lower Extremity Etiology: Wound Location: Left, Lateral Lower Leg Wound Status: Open Wounding Event: Trauma Comorbid Arrhythmia, Congestive Heart Failure, Hypertension, Type II Date Acquired: 08/05/2022 History: Diabetes Weeks Of Treatment: 0 Clustered Wound: No Photos Harmon, [name] ([id number]) [id number] Page 8 of 9 Wound Measurements Length: (cm) 9 Width: (cm) 5 Depth: (cm) 0.9 Area: (cm) 35.343 Volume: (cm) 31.809 % Reduction in Area: % Reduction in Volume: Tunneling: Yes Location 1 Position (o'clock): 12 Maximum Distance: (cm) 3 Location 2 Position (o'clock): 9 Maximum Distance: (cm) 8 Wound Description Classification: Grade 2 Wound Margin: Distinct, outline attached Exudate Amount: Medium Exudate Type: Serosanguineous Exudate Color: red, brown Foul Odor After Cleansing: Yes Due to Product Use: No Slough/Fibrino No Wound Bed Granulation Amount: Medium (34-66%) Exposed Structure Granulation Quality: Red Fat Layer (Subcutaneous Tissue) Exposed: Yes Necrotic Amount: Medium (34-66%) Necrotic Quality: Eschar Periwound Skin Texture Texture Color No Abnormalities Noted: No No Abnormalities Noted: No Rash: Yes Hemosiderin Staining: Yes Moisture Temperature / Pain No Abnormalities Noted: No Temperature: No Abnormality Dry / Scaly: No Tenderness on Palpation: Yes Maceration: No Electronic Signature(s) Signed: 09/20/2022 7:59:22 AM By: Brenton Grills Entered By: Brenton Grills on 09/06/2022  08:40:37 -------------------------------------------------------------------------------- Wound Assessment Details Patient Name: Date of Service: CHAYLA, SHANDS 09/06/2022 8:00 A M Medical Record Number: [id number] Patient Account Number: [id number] Date of Birth/Sex: Treating RN: 08/11/1940 (82 y.o. Gevena Mart Primary Care Natoya Viscomi: PA Zenovia Jordan, NO Other Clinician: Referring Braheem Tomasik: Treating Denya Buckingham/Extender: Sherryl Manges in Treatment: 0 Wound Status Wound Number: 2 Primary Etiology: Diabetic Wound/Ulcer of the Lower Extremity Wound Location: Left, Posterior Lower Leg Wound Status: Converted Wounding Event: Trauma Date Acquired: 08/05/2022 DAY, GREB ([id number]) [id number] Page 9 of 9 Weeks Of Treatment: 0 Clustered Wound: No Wound Measurements Length: (cm) Width: (cm) Depth: (cm) Area: (cm) Volume: (cm) 0 % Reduction in Area: 0 % Reduction in Volume: 0 0 0 Periwound Skin Texture Texture Color No Abnormalities Noted: No No Abnormalities Noted: No Moisture No Abnormalities Noted: No Treatment Notes Wound #2 (Lower Leg) Wound Laterality: Left, Posterior Cleanser Peri-Wound Care Topical Primary Dressing Secondary Dressing Secured With Compression Wrap Compression Stockings Add-Ons Electronic Signature(s) Signed: 09/20/2022 7:59:22 AM By: Brenton Grills Entered By: Brenton Grills on 09/06/2022 08:34:20 -------------------------------------------------------------------------------- Vitals Details Patient Name: Date of Service: [name] Long, Tea 09/06/2022 8:00 A M Medical Record Number: [id number] Patient Account Number: [id number] Date of Birth/Sex: Treating RN: Jul 02, 1940 (82 y.o. Gevena Mart Primary Care Hamad Whyte: PA Zenovia Jordan, NO Other Clinician: Referring Juanito Gonyer: Treating Kijuan Gallicchio/Extender: Sherryl Manges in Treatment: 0 Vital Signs Time Taken: 08:00 Temperature (F): 98.5 Pulse (bpm): 78 Respiratory Rate  (breaths/min): 18 Blood Pressure (mmHg): 160/87 Capillary Blood Glucose (mg/dl): 244 Reference Range: 80 - 120 mg / dl Electronic Signature(s) Signed: 09/20/2022 7:59:22 AM By: Brenton Grills Entered By: Brenton Grills on 09/06/2022 07:58:35

## 2022-09-21 NOTE — Progress Notes (Signed)
[place], [place] ([id number]) [id number] Page 1 of 9 Visit Report for 09/20/2022 Arrival Information Details Patient Name: Date of Service: [name] Long, [name] Long 09/20/2022 8:15 A M Medical Record Number: [id number] Patient Account Number: [id number] Date of Birth/Sex: Treating RN: September 04, 1940 (82 y.o. F) Primary Care Jasten Guyette: PA TIENT, NO Other Clinician: Referring Shamya Macfadden: Treating Brysun Eschmann/Extender: Sherryl Manges in Treatment: 2 Visit Information History Since Last Visit All ordered tests and consults were completed: No Patient Arrived: Dan Humphreys Added or deleted any medications: No Arrival Time: 08:30 Any new allergies or adverse reactions: No Accompanied By: daughter Had a fall or experienced change in No Transfer Assistance: None activities of daily living that may affect Patient Identification Verified: Yes risk of falls: Secondary Verification Process Completed: Yes Signs or symptoms of abuse/neglect since last visito No Patient Requires Transmission-Based Precautions: No Hospitalized since last visit: No Patient Has Alerts: No Implantable device outside of the clinic excluding No cellular tissue based products placed in the center since last visit: Has Dressing in Place as Prescribed: Yes Has Compression in Place as Prescribed: Yes Pain Present Now: No Electronic Signature(s) Signed: 09/20/2022 5:33:12 PM By: [name] RN, BSN Entered By: [name] on 09/20/2022 08:47:46 -------------------------------------------------------------------------------- Encounter Discharge Information Details Patient Name: Date of Service: [name] Long, [name] Long 09/20/2022 8:15 A M Medical Record Number: [id number] Patient Account Number: [id number] Date of Birth/Sex: Treating RN: 09/12/1940 (82 y.o. [name] Reizel Calzada: PA Zenovia Jordan, [place] Other Clinician: Referring Montae Stager: Treating Xara Paulding/Extender: Sherryl Manges in Treatment:  2 Encounter Discharge Information Items Post Procedure Vitals Discharge Condition: Stable Temperature (F): 98 Ambulatory Status: Walker Pulse (bpm): 69 Discharge Destination: Home Respiratory Rate (breaths/min): 18 Transportation: Private Auto Blood Pressure (mmHg): 121/69 Accompanied By: daughter Schedule Follow-up Appointment: Yes Clinical Summary of Care: Patient Declined Electronic Signature(s) Signed: 09/20/2022 5:33:12 PM By: [name] RN, BSN Entered By: [name] on 09/20/2022 09:50:07 [place], [name] Long ([id number]) [id number] Page 2 of 9 -------------------------------------------------------------------------------- Lower Extremity Assessment Details Patient Name: Date of Service: [name] Long, [name] Long 09/20/2022 8:15 A M Medical Record Number: [id number] Patient Account Number: [id number] Date of Birth/Sex: Treating RN: 04-30-1940 (82 y.o. [name] Charisa Twitty: PA Zenovia Jordan, [place] Other Clinician: Referring Maelle Sheaffer: Treating Eaton Folmar/Extender: Sherryl Manges in Treatment: 2 Edema Assessment Assessed: [Left: No] [Right: No] Edema: [Left: Ye] [Right: s] Calf Left: Right: Point of Measurement: From Medial Instep 33 cm Ankle Left: Right: Point of Measurement: From Medial Instep 250 cm Vascular Assessment Pulses: Dorsalis Pedis Palpable: [Left:No] Extremity colors, hair growth, and conditions: Extremity Color: [Left:Hyperpigmented] Hair Growth on Extremity: [Left:No] Temperature of Extremity: [Left:Warm < 3 seconds] Electronic Signature(s) Signed: 09/20/2022 5:33:12 PM By: [name] RN, BSN Entered By: [name] on 09/20/2022 08:49:55 -------------------------------------------------------------------------------- Multi Wound Chart Details Patient Name: Date of Service: [name] Long, [name] Long 09/20/2022 8:15 A M Medical Record Number: [id number] Patient Account Number: [id number] Date of Birth/Sex: Treating  RN: 10-08-1940 (82 y.o. F) Primary Care Farheen Pfahler: PA TIENT, NO Other Clinician: Referring Altha Sweitzer: Treating Asheton Viramontes/Extender: Sherryl Manges in Treatment: 2 Vital Signs Height(in): 64 Capillary Blood Glucose(mg/dl): 742 Weight(lbs): 595 Pulse(bpm): 69 Body Mass Index(BMI): 27.8 Blood Pressure(mmHg): 121/69 Temperature(F): 98.0 Respiratory Rate(breaths/min): 18 [1:Photos:] [N/A:N/A [id number] Page 3 of 9] Left, Lateral Lower Leg Left, Posterior Lower Leg N/A Wound Location: Hematoma Trauma N/A Wounding Event: Diabetic Wound/Ulcer of the Lower Infection - not elsewhere classified N/A Primary Etiology: Extremity Arrhythmia, Congestive Heart Failure, Arrhythmia, Congestive Heart Failure, N/A Comorbid History: Hypertension, Type II Diabetes Hypertension, Type II Diabetes  08/05/2022 09/20/2022 N/A Date Acquired: 2 0 N/A Weeks of Treatment: Open Open N/A Wound Status: No No N/A Wound Recurrence: Yes No N/A Clustered Wound: 2 N/A N/A Clustered Quantity: 4.5x2.1x2.1 2.4x2.3x0.1 N/A Measurements L x W x D (cm) 7.422 4.335 N/A A (cm) : rea 15.586 0.434 N/A Volume (cm) : 79.00% N/A N/A % Reduction in A rea: 51.00% N/A N/A % Reduction in Volume: 9 Position 1 (o'clock): 3.9 Maximum Distance 1 (cm): 5 Starting Position 1 (o'clock): 12 Ending Position 1 (o'clock): 3.2 Maximum Distance 1 (cm): No Yes N/A Tunneling: Yes No N/A Undermining: Grade 2 Full Thickness Without Exposed N/A Classification: Support Structures Large Medium N/A Exudate Amount: Serosanguineous Serosanguineous N/A Exudate Type: red, brown red, brown N/A Exudate Color: Well defined, not attached Flat and Intact N/A Wound Margin: Large (67-100%) Large (67-100%) N/A Granulation Amount: Red Red N/A Granulation Quality: Small (1-33%) None Present (0%) N/A Necrotic Amount: Fat Layer (Subcutaneous Tissue): Yes Fat Layer (Subcutaneous Tissue): Yes N/A Exposed  Structures: Fascia: No Fascia: No Tendon: No Tendon: No Muscle: No Muscle: No Joint: No Joint: No Bone: No Bone: No None Small (1-33%) N/A Epithelialization: Rash: No No Abnormalities Noted N/A Periwound Skin Texture: Maceration: No No Abnormalities Noted N/A Periwound Skin Moisture: Dry/Scaly: No Hemosiderin Staining: Yes Hemosiderin Staining: Yes N/A Periwound Skin Color: No Abnormality No Abnormality N/A Temperature: Negative Pressure Wound Therapy N/A N/A Procedures Performed: Application (NPWT) Treatment Notes Electronic Signature(s) Signed: 09/20/2022 9:12:17 AM By: Duanne Guess MD FACS Entered By: Duanne Guess on 09/20/2022 09:12:17 -------------------------------------------------------------------------------- Multi-Disciplinary Care Plan Details Patient Name: Date of Service: [name] Long, [name] Long 09/20/2022 8:15 A M Medical Record Number: [id number] Patient Account Number: [id number] Date of Birth/Sex: Treating RN: 02-14-1941 (82 y.o. [name] Sir Mallis: PA Zenovia Jordan, [place] Other Clinician: Referring Cristine Daw: Treating Carter Kassel/Extender: Sherryl Manges in Treatment: 2 Multidisciplinary Care Plan reviewed with physician [address] [place], [place] ([id number]) [id number] Page 4 of 9 Wound/Skin Impairment Nursing Diagnoses: Impaired tissue integrity Goals: Patient/caregiver will verbalize understanding of skin care regimen Date Initiated: 09/06/2022 Target Resolution Date: 11/04/2022 Goal Status: Active Interventions: Assess patient/caregiver ability to obtain necessary supplies Assess patient/caregiver ability to perform ulcer/skin care regimen upon admission and as needed Assess ulceration(s) every visit Provide education on ulcer and skin care Screen for HBO Treatment Activities: Skin care regimen initiated : 09/06/2022 Topical wound management initiated : 09/06/2022 Notes: Electronic  Signature(s) Signed: 09/20/2022 5:33:12 PM By: [name] RN, BSN Entered By: [name] on 09/20/2022 08:59:07 -------------------------------------------------------------------------------- Negative Pressure Wound Therapy Application (NPWT) Details Patient Name: Date of Service: [name] Long, [name] Long 09/20/2022 8:15 A M Medical Record Number: [id number] Patient Account Number: [id number] Date of Birth/Sex: Treating RN: 26-Mar-1940 (82 y.o. [name] Yomira Flitton: PA Zenovia Jordan, [place] Other Clinician: Referring Zaela Graley: Treating Laurent Cargile/Extender: Sherryl Manges in Treatment: 2 NPWT Application Performed for: Wound #1 Left, Lateral Lower Leg Additional Injuries Covered: Yes Performed By: [name], RN Type: Medela [employer] Coverage Size (sq cm): 9.45 Pressure Type: Constant Pressure Setting: 125 mmHG Drain Type: None Primary Contact: None Quantity of Sponges/Gauze Inserted: 1 Sponge/Dressing Type: Foam- Black Date Initiated: 09/20/2022 Response to Treatment: good Post Procedure Diagnosis Same as Pre-procedure Electronic Signature(s) Signed: 09/20/2022 5:33:12 PM By: [name] RN, BSN Entered By: [name] on 09/20/2022 09:10:55 Pain Assessment Details -------------------------------------------------------------------------------- Antony Odea ([id number]) [id number] Page 5 of 9 Patient Name: Date of Service: [name] Long, [name] Long 09/20/2022 8:15 A M Medical Record Number: [id number] Patient Account Number: [id number] Date of Birth/Sex:  Treating RN: May 04, 1940 (82 y.o. F) Primary Care Brynna Dobos: PA Zenovia Jordan, NO Other Clinician: Referring Asuncion Tapscott: Treating George Alcantar/Extender: Sherryl Manges in Treatment: 2 Active Problems Location of Pain Severity and Description of Pain Patient Has Paino No Site Locations Rate the pain. Current Pain Level: 0 Pain Management and Medication Current Pain Management: Electronic  Signature(s) Signed: 09/20/2022 5:33:12 PM By: [name] RN, BSN Entered By: [name] on 09/20/2022 08:48:15 -------------------------------------------------------------------------------- Patient/Caregiver Education Details Patient Name: Date of Service: [name] Long, [name] Long 7/17/2024andnbsp8:15 A M Medical Record Number: [id number] Patient Account Number: [id number] Date of Birth/Gender: Treating RN: 05-27-1940 (82 y.o. [name] Physician: PA Zenovia Jordan, [place] Other Clinician: Referring Physician: Treating Physician/Extender: Sherryl Manges in Treatment: 2 Education Assessment Education Provided To: Patient Education Topics Provided Venous: Methods: Explain/Verbal Responses: Reinforcements needed, State content correctly Wound/Skin Impairment: Methods: Explain/Verbal Responses: Reinforcements needed, State content correctly Electronic Signature(s) Signed: 09/20/2022 5:33:12 PM By: [name] RN, BSN Stevphen [place], Signed: 09/20/2022 5:33:12 PM By: [name] RN, BSN [place] ([id number]) [id number] Page 6 of 9 Entered By: [name] on 09/20/2022 08:59:26 -------------------------------------------------------------------------------- Wound Assessment Details Patient Name: Date of Service: [name] Long, [name] Long 09/20/2022 8:15 A M Medical Record Number: [id number] Patient Account Number: [id number] Date of Birth/Sex: Treating RN: [birth year] (81 y.o. F) Primary Care Eissa Buchberger: PA TIENT, NO Other Clinician: Referring Adonias Demore: Treating Rhylie Stehr/Extender: Sherryl Manges in Treatment: 2 Wound Status Wound Number: 1 Primary Diabetic Wound/Ulcer of the Lower Extremity Etiology: Wound Location: Left, Lateral Lower Leg Wound Status: Open Wounding Event: Hematoma Comorbid Arrhythmia, Congestive Heart Failure, Hypertension, Type II Date Acquired: 08/05/2022 History: Diabetes Weeks Of Treatment: 2 Clustered Wound:  Yes Photos Wound Measurements Length: (cm) 4.5 Width: (cm) 2.1 Depth: (cm) 2.1 Clustered Quantity: 2 Area: (cm) 7.422 Volume: (cm) 15.586 % Reduction in Area: 79% % Reduction in Volume: 51% Epithelialization: None Tunneling: No Undermining: Yes Starting Position (o'clock): 5 Ending Position (o'clock): 12 Maximum Distance: (cm) 3.2 Wound Description Classification: Grade 2 Wound Margin: Well defined, not attached Exudate Amount: Large Exudate Type: Serosanguineous Exudate Color: red, brown Foul Odor After Cleansing: No Slough/Fibrino Yes Wound Bed Granulation Amount: Large (67-100%) Exposed Structure Granulation Quality: Red Fascia Exposed: No Necrotic Amount: Small (1-33%) Fat Layer (Subcutaneous Tissue) Exposed: Yes Necrotic Quality: Adherent Slough Tendon Exposed: No Muscle Exposed: No Joint Exposed: No Bone Exposed: No Periwound Skin Texture Texture Color No Abnormalities Noted: Yes No Abnormalities Noted: No Hemosiderin Staining: Yes Moisture No Abnormalities Noted: Yes Temperature / Pain Temperature: No Abnormality Mier, Alayna ([id number]) [id number] Page 7 of 9 Treatment Notes Wound #1 (Lower Leg) Wound Laterality: Left, Lateral Cleanser Soap and Water Discharge Instruction: May shower and wash wound with dial antibacterial soap and water prior to dressing change. Vashe 5.8 (oz) Discharge Instruction: Cleanse the wound with Vashe prior to applying a clean dressing using gauze sponges, not tissue or cotton balls. Peri-Wound Care Topical Primary Dressing NPWT Secondary Dressing Secured With Compression Wrap Kerlix Roll 4.5x3.1 (in/yd) Discharge Instruction: Apply Kerlix and Coban compression as directed. Coban Self-Adherent Wrap 4x5 (in/yd) Discharge Instruction: Apply over Kerlix as directed. Compression Stockings Add-Ons Electronic Signature(s) Signed: 09/20/2022 5:33:12 PM By: [name] RN, BSN Entered By:  [name] on 09/20/2022 09:01:13 -------------------------------------------------------------------------------- Wound Assessment Details Patient Name: Date of Service: [name] Long, [name] Long 09/20/2022 8:15 A M Medical Record Number: [id number] Patient Account Number: [id number] Date of Birth/Sex: Treating RN: 07/17/1940 (82 y.o. [name] Ronniesha Seibold: PA Zenovia Jordan, [place] Other Clinician: Referring Lela Murfin: Treating Kalayna Noy/Extender: Duanne Guess  Weeks in Treatment: 2 Wound Status Wound Number: 3 Primary Infection - not elsewhere classified Etiology: Wound Location: Left, Posterior Lower Leg Wound Status: Open Wounding Event: Trauma Comorbid Arrhythmia, Congestive Heart Failure, Hypertension, Type II Date Acquired: 09/20/2022 History: Diabetes Weeks Of Treatment: 0 Clustered Wound: No Photos Bricelyn, [name] Long ([id number]) [id number] Page 8 of 9 Wound Measurements Length: (cm) 2.4 Width: (cm) 2.3 Depth: (cm) 0.1 Area: (cm) 4.335 Volume: (cm) 0.434 % Reduction in Area: % Reduction in Volume: Epithelialization: Small (1-33%) Tunneling: Yes Position (o'clock): 9 Maximum Distance: (cm) 3.9 Undermining: No Wound Description Classification: Full Thickness Without Exposed Support Structures Wound Margin: Flat and Intact Exudate Amount: Medium Exudate Type: Serosanguineous Exudate Color: red, brown Foul Odor After Cleansing: No Slough/Fibrino No Wound Bed Granulation Amount: Large (67-100%) Exposed Structure Granulation Quality: Red Fascia Exposed: No Necrotic Amount: None Present (0%) Fat Layer (Subcutaneous Tissue) Exposed: Yes Tendon Exposed: No Muscle Exposed: No Joint Exposed: No Bone Exposed: No Periwound Skin Texture Texture Color No Abnormalities Noted: Yes No Abnormalities Noted: No Hemosiderin Staining: Yes Moisture No Abnormalities Noted: Yes Temperature / Pain Temperature: No Abnormality Treatment Notes Wound #3  (Lower Leg) Wound Laterality: Left, Posterior Cleanser Peri-Wound Care Topical Primary Dressing Maxorb Extra Ag+ Alginate Dressing, 2x2 (in/in) Discharge Instruction: Apply to wound bed as instructed Secondary Dressing Woven Gauze Sponge, Non-Sterile 4x4 in Discharge Instruction: Apply over primary dressing as directed. Secured With Compression Wrap Kerlix Roll 4.5x3.1 (in/yd) Discharge Instruction: Apply Kerlix and Coban compression as directed. Coban Self-Adherent Wrap 4x5 (in/yd) Discharge Instruction: Apply over Kerlix as directed. Compression Stockings Add-Ons Electronic Signature(s) Signed: 09/20/2022 5:33:12 PM By: [name] RN, BSN Entered By: [name] on 09/20/2022 09:01:53 [place], [name] Long ([id number]) [id number] Page 9 of 9 -------------------------------------------------------------------------------- Vitals Details Patient Name: Date of Service: [name] Long, [name] Long 09/20/2022 8:15 A M Medical Record Number: [id number] Patient Account Number: [id number] Date of Birth/Sex: Treating RN: February 16, 1941 (82 y.o. F) Primary Care Mercer Peifer: PA TIENT, NO Other Clinician: Referring Lucile Didonato: Treating Inesha Sow/Extender: Sherryl Manges in Treatment: 2 Vital Signs Time Taken: 08:30 Temperature (F): 98.0 Height (in): 64 Pulse (bpm): 69 Weight (lbs): 162 Respiratory Rate (breaths/min): 18 Body Mass Index (BMI): 27.8 Blood Pressure (mmHg): 121/69 Capillary Blood Glucose (mg/dl): 086 Reference Range: 80 - 120 mg / dl Notes glucose per pt report this am Electronic Signature(s) Signed: 09/20/2022 5:33:12 PM By: [name] RN, BSN Entered By: [name] on 09/20/2022 08:48:08

## 2022-09-25 ENCOUNTER — Encounter (HOSPITAL_BASED_OUTPATIENT_CLINIC_OR_DEPARTMENT_OTHER): Payer: Medicare Other | Attending: General Surgery | Admitting: General Surgery

## 2022-09-25 DIAGNOSIS — E1122 Type 2 diabetes mellitus with diabetic chronic kidney disease: Secondary | ICD-10-CM | POA: Insufficient documentation

## 2022-09-25 DIAGNOSIS — I130 Hypertensive heart and chronic kidney disease with heart failure and stage 1 through stage 4 chronic kidney disease, or unspecified chronic kidney disease: Secondary | ICD-10-CM | POA: Diagnosis not present

## 2022-09-25 DIAGNOSIS — E11622 Type 2 diabetes mellitus with other skin ulcer: Secondary | ICD-10-CM | POA: Diagnosis not present

## 2022-09-25 DIAGNOSIS — L97822 Non-pressure chronic ulcer of other part of left lower leg with fat layer exposed: Secondary | ICD-10-CM | POA: Insufficient documentation

## 2022-09-25 DIAGNOSIS — I5042 Chronic combined systolic (congestive) and diastolic (congestive) heart failure: Secondary | ICD-10-CM | POA: Insufficient documentation

## 2022-09-25 DIAGNOSIS — I4891 Unspecified atrial fibrillation: Secondary | ICD-10-CM | POA: Diagnosis not present

## 2022-09-25 DIAGNOSIS — Z7901 Long term (current) use of anticoagulants: Secondary | ICD-10-CM | POA: Diagnosis not present

## 2022-09-25 DIAGNOSIS — N183 Chronic kidney disease, stage 3 unspecified: Secondary | ICD-10-CM | POA: Diagnosis not present

## 2022-09-25 NOTE — Progress Notes (Addendum)
[place], [place] ([id number]) [id number] Page 1 of 9 Visit Report for 09/25/2022 Chief Complaint Document Details Patient Name: Date of Service: [name] Long, [name] Long 09/25/2022 9:15 A M Medical Record Number: [id number] Patient Account Number: [id number] Date of Birth/Sex: Treating RN: 1940-04-09 (82 y.o. F) Primary Care Provider: PA Zenovia Jordan, NO Other Clinician: Referring Provider: Treating Provider/Extender: Sherryl Manges in Treatment: 2 Information Obtained from: Patient Chief Complaint Patient seen for complaints of Non-Healing Wound. Electronic Signature(s) Signed: 10/02/2022 10:13:11 AM By: Duanne Guess MD FACS Entered By: Duanne Guess on 10/02/2022 10:13:11 -------------------------------------------------------------------------------- Debridement Details Patient Name: Date of Service: [name] Long 09/25/2022 9:15 A M Medical Record Number: [id number] Patient Account Number: [id number] Date of Birth/Sex: Treating RN: 1940/05/14 (82 y.o. F) Primary Care Provider: PA Zenovia Jordan, NO Other Clinician: Referring Provider: Treating Provider/Extender: Sherryl Manges in Treatment: 2 Debridement Performed for Assessment: Wound #3 Left,Posterior Lower Leg Performed By: Physician Duanne Guess, MD Debridement Type: Debridement Level of Consciousness (Pre-procedure): Awake and Alert Pre-procedure Verification/Time Out Yes - 09:55 Taken: Start Time: 09:57 Pain Control: Lidocaine 5% topical ointment Percent of Wound Bed Debrided: 100% T Area Debrided (cm): otal 3.63 Tissue and other material debrided: Non-Viable, Slough, Slough Level: Non-Viable Tissue Debridement Description: Selective/Open Wound Instrument: Curette Bleeding: Minimum Hemostasis Achieved: Pressure Response to Treatment: Procedure was tolerated well Level of Consciousness (Post- Awake and Alert procedure): Post Debridement Measurements of Total Wound Length: (cm) 2.2 Width:  (cm) 2.1 Depth: (cm) 0.1 Volume: (cm) 0.363 Character of Wound/Ulcer Post Debridement: Improved Post Procedure Diagnosis Same as [name] Long, [name] Long ([id number]) [id number] Page 2 of 9 Notes Scribed for Dr Lady Gary by Redmond Pulling, Rn Electronic Signature(s) Signed: 10/02/2022 10:13:05 AM By: Duanne Guess MD FACS Previous Signature: 09/25/2022 11:13:49 AM Version By: Duanne Guess MD FACS Previous Signature: 09/25/2022 4:30:58 PM Version By: Redmond Pulling RN, BSN Entered By: Duanne Guess on 10/02/2022 10:13:05 -------------------------------------------------------------------------------- HPI Details Patient Name: Date of Service: HA Long, [name] 09/25/2022 9:15 A M Medical Record Number: [id number] Patient Account Number: [id number] Date of Birth/Sex: Treating RN: 29-May-1940 (82 y.o. [name] Primary Care Provider: PA TIENT, NO Other Clinician: Referring Provider: Treating Provider/Extender: Sherryl Manges in Treatment: 2 History of Present Illness HPI Description: ADMISSION 09/06/2022 This is an 82 year old type II diabetic (no A1c to review and patient is unaware of her last value) with a history of atrial fibrillation on chronic anticoagulant therapy, lower extremity edema, congestive heart failure, and stage III kidney disease. She divides her time between her home in [place] and here in [place] where her daughter lives. While in [place], she struck her leg on her bed frame and developed a large hematoma. She was evaluated in the emergency department there and imaging was negative for any deeper injury, such as a fracture. She returned to [facility] and has had an urgent care and emergency department visit to evaluate the site. As there had been no significant improvement in her wound, she was referred to the wound care center for further evaluation and management. I am unable to see any evidence of an attempt to  evacuate the hematoma in the electronic medical record.. There are 2 openings in her skin, but they connect to each other with significant tunneling and undermining. I am unsure if these opened spontaneously or there was a non- documented attempt to drain the hematoma site. The majority of her left lower leg from below the tibial tuberosity to just above the ankle and from the anterior tibia wrapping laterally and  posteriorly to nearly the medial aspect of her leg is involved. There is a foul odor coming from her wound. There is no frank pus but there is significant retained clot that has likely served as culture media. ABI in clinic today was 0.84. 09/08/2022: The patient came in today as an add-on due to bloody drainage that was saturating her dressing. On inspection, it appeared that there was no frank fresh blood and no active site of bleeding identified; it appears that more of the hematoma was liquefied and draining from her wound. 09/13/2022: The wound has cleaned up considerably. There is no longer any foul odor and the drainage has decreased significantly. The patient is also complaining of less pain. There is still some nonviable subcutaneous tissue and fat present but overall everything is improved from the last visit. Her culture grew out a polymicrobial population of multiple drug-resistant organisms. She is currently taking Augmentin and levofloxacin as suggested by the culture data. 09/20/2022: The cavity is contracting nicely. There is no longer a connection between the posterior wound and the lateral leg wound. The posterior wound has a little bit of slough on the surface but the larger main wound is very clean. She has been approved for a wound VAC and we will apply that today. 09/25/2022: Post Wound w/slough; alginate Ag. Vac on lateral wound; decreased undermining. Electronic Signature(s) Signed: 10/02/2022 10:13:31 AM By: Duanne Guess MD FACS Previous Signature: 09/29/2022 4:29:32 PM  Version By: Shawn Stall RN, BSN Entered By: Duanne Guess on 10/02/2022 10:13:31 -------------------------------------------------------------------------------- Physical Exam Details Patient Name: Date of Service: KERRIANNE, JENG 09/25/2022 9:15 A M Medical Record Number: [id number] Patient Account Number: [id number] Date of Birth/Sex: Treating RN: [birth year] (82 y.o. [name] Primary Care Provider: PA Zenovia Jordan, [place] Other Clinician: Referring Provider: Treating Provider/Extender: Sherryl Manges in Treatment: 2 Constitutional Slightly hypertensive. . . . no acute distress. [place], [place] ([id number]) [id number] Page 3 of 9 Respiratory Normal work of breathing on room air. Notes 09/25/2022: Post wound 1/3 smaller with rubbery slough on surface. Lateral wound 1/2 previous size. Electronic Signature(s) Signed: 10/02/2022 10:14:01 AM By: Duanne Guess MD FACS Previous Signature: 09/29/2022 4:29:32 PM Version By: Shawn Stall RN, BSN Entered By: Duanne Guess on 10/02/2022 10:14:01 -------------------------------------------------------------------------------- Physician Orders Details Patient Name: Date of Service: SKYLEY, GRANDMAISON 09/25/2022 9:15 A M Medical Record Number: [id number] Patient Account Number: [id number] Date of Birth/Sex: Treating RN: 22-Dec-1940 (82 y.o. Orville Govern Primary Care Provider: PA Zenovia Jordan, [place] Other Clinician: Referring Provider: Treating Provider/Extender: Sherryl Manges in Treatment: 2 Verbal / Phone Orders: No Diagnosis Coding ICD-10 Coding Code Description [id number] 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 Z79.01 Long term (current) use of anticoagulants I50.42 Chronic combined systolic (congestive) and diastolic (congestive) heart failure N18.30 Chronic kidney disease, stage 3 unspecified Follow-up  Appointments ppointment in 1 week. - Dr. Lady Gary - Return A room 2 7/22 at 9:15 AM Nurse Visit: - 7/12 at 8:00 AM room 3 7/25 at 11:30 AM room 2 Anesthetic (In clinic) Topical Lidocaine 4% applied to wound bed Bathing/ Shower/ Hygiene May shower with protection but do not get wound dressing(s) wet. Protect dressing(s) with water repellant cover (for example, large plastic bag) or a cast cover and may then take shower. Negative Presssure Wound Therapy Medela Wound Vac continuously at 175mm/hg Black Foam Edema Control - Lymphedema / SCD / Other Elevate legs to the level  of the heart or above for 30 minutes daily and/or when sitting for 3-4 times a day throughout the day. Avoid standing for long periods of time. Exercise regularly Home Health Wound #1 Left,Lateral Lower Leg Admit to Home Health for skilled nursing wound care. May utilize formulary equivalent dressing for wound treatment orders unless otherwise specified. Dressing changes to be completed by Home Health on Monday / Wednesday / Friday except when patient has scheduled visit at [facility]. Wound Treatment Wound #1 - Lower Leg Wound Laterality: Left, Lateral Cleanser: Soap and Water 2 x Per Week/30 Days Discharge Instructions: May shower and wash wound with dial antibacterial soap and water prior to dressing change. Cleanser: Vashe 5.8 (oz) 2 x Per Week/30 Days Discharge Instructions: Cleanse the wound with Vashe prior to applying a clean dressing using gauze sponges, not tissue or cotton balls. [place], [place] ([id number]) [id number] Page 4 of 9 Prim Dressing: NPWT ary 2 x Per Week/30 Days Compression Wrap: Kerlix Roll 4.5x3.1 (in/yd) 2 x Per Week/30 Days Discharge Instructions: Apply Kerlix and Coban compression as directed. Compression Wrap: Coban Self-Adherent Wrap 4x5 (in/yd) 2 x Per Week/30 Days Discharge Instructions: Apply over Kerlix as directed. Wound #3 - Lower Leg Wound  Laterality: Left, Posterior Prim Dressing: Maxorb Extra Ag+ Alginate Dressing, 2x2 (in/in) 2 x Per Week/30 Days ary Discharge Instructions: Apply to wound bed as instructed Secondary Dressing: Woven Gauze Sponge, Non-Sterile 4x4 in 2 x Per Week/30 Days Discharge Instructions: Apply over primary dressing as directed. Compression Wrap: Kerlix Roll 4.5x3.1 (in/yd) 2 x Per Week/30 Days Discharge Instructions: Apply Kerlix and Coban compression as directed. Compression Wrap: Coban Self-Adherent Wrap 4x5 (in/yd) 2 x Per Week/30 Days Discharge Instructions: Apply over Kerlix as directed. Electronic Signature(s) Signed: 10/02/2022 10:26:58 AM By: Duanne Guess MD FACS Previous Signature: 09/25/2022 11:13:49 AM Version By: Duanne Guess MD FACS Previous Signature: 09/25/2022 4:30:58 PM Version By: Redmond Pulling RN, BSN Entered By: Duanne Guess on 10/02/2022 10:14:15 -------------------------------------------------------------------------------- Problem List Details Patient Name: Date of Service: [name] Heinz, Oliva 09/25/2022 9:15 A M Medical Record Number: [id number] Patient Account Number: [id number] Date of Birth/Sex: Treating RN: 01-26-1941 (82 y.o. F) Primary Care Provider: PA TIENT, NO Other Clinician: Referring Provider: Treating Provider/Extender: Sherryl Manges in Treatment: 2 Active Problems ICD-10 Encounter Code Description Active Date MDM Diagnosis L97.822 Non-pressure chronic ulcer of other part of left lower leg with fat layer exposed7/05/2022 No Yes E11.622 Type 2 diabetes mellitus with other skin ulcer 09/06/2022 No Yes Z79.01 Long term (current) use of anticoagulants 09/06/2022 No Yes I50.42 Chronic combined systolic (congestive) and diastolic (congestive) heart failure 09/06/2022 No Yes N18.30 Chronic kidney disease, stage 3 unspecified 09/06/2022 No Yes Inactive Problems JEYLI, ZWICKER ([id number]) [id number] Page 5 of 9 Resolved  Problems Electronic Signature(s) Signed: 10/02/2022 10:12:46 AM By: Duanne Guess MD FACS Previous Signature: 09/25/2022 11:59:17 AM Version By: Duanne Guess MD FACS Entered By: Duanne Guess on 10/02/2022 10:12:46 -------------------------------------------------------------------------------- Progress Note Details Patient Name: Date of Service: HA Long, Kourtney 09/25/2022 9:15 A M Medical Record Number: [id number] Patient Account Number: [id number] Date of Birth/Sex: Treating RN: [birth year] (82 y.o. [name] Primary Care Provider: PA Zenovia Jordan, [place] Other Clinician: Referring Provider: Treating Provider/Extender: Sherryl Manges in Treatment: 2 Subjective Chief Complaint Information obtained from Patient Patient seen for complaints of Non-Healing Wound. History of Present Illness (HPI) ADMISSION 09/06/2022 This is an 82 year old type II diabetic (no A1c to review and patient is unaware of her last value) with a history of atrial  fibrillation on chronic anticoagulant therapy, lower extremity edema, congestive heart failure, and stage III kidney disease. She divides her time between her home in [place] and here in [place] where her daughter lives. While in [place], she struck her leg on her bed frame and developed a large hematoma. She was evaluated in the emergency department there and imaging was negative for any deeper injury, such as a fracture. She returned to [facility] and has had an urgent care and emergency department visit to evaluate the site. As there had been no significant improvement in her wound, she was referred to the wound care center for further evaluation and management. I am unable to see any evidence of an attempt to evacuate the hematoma in the electronic medical record.. There are 2 openings in her skin, but they connect to each other with significant tunneling and undermining. I am unsure if these opened spontaneously or there was a  non- documented attempt to drain the hematoma site. The majority of her left lower leg from below the tibial tuberosity to just above the ankle and from the anterior tibia wrapping laterally and posteriorly to nearly the medial aspect of her leg is involved. There is a foul odor coming from her wound. There is no frank pus but there is significant retained clot that has likely served as culture media. ABI in clinic today was 0.84. 09/08/2022: The patient came in today as an add-on due to bloody drainage that was saturating her dressing. On inspection, it appeared that there was no frank fresh blood and no active site of bleeding identified; it appears that more of the hematoma was liquefied and draining from her wound. 09/13/2022: The wound has cleaned up considerably. There is no longer any foul odor and the drainage has decreased significantly. The patient is also complaining of less pain. There is still some nonviable subcutaneous tissue and fat present but overall everything is improved from the last visit. Her culture grew out a polymicrobial population of multiple drug-resistant organisms. She is currently taking Augmentin and levofloxacin as suggested by the culture data. 09/20/2022: The cavity is contracting nicely. There is no longer a connection between the posterior wound and the lateral leg wound. The posterior wound has a little bit of slough on the surface but the larger main wound is very clean. She has been approved for a wound VAC and we will apply that today. 09/25/2022: Post Wound w/slough; alginate Ag. Vac on lateral wound; decreased undermining. Patient History Information obtained from Patient. Family History No family history of Hereditary Spherocytosis, Hypertension, Kidney Disease, Lung Disease, Seizures, Stroke, Thyroid Problems, Tuberculosis. Social History Never smoker, Marital Status - Widowed, Alcohol Use - Never, Drug Use - No History, Caffeine Use - Never. Medical  History Cardiovascular Patient has history of Arrhythmia - A fib, Congestive Heart Failure, Hypertension Endocrine Patient has history of Type II Diabetes - since 2019 Medical A Surgical History Notes nd Cardiovascular High Cholesterol Genitourinary Kidney Disease LENZI, MARMO ([id number]) [id number] Page 6 of 9 Objective Constitutional Slightly hypertensive. no acute distress. Vitals Time Taken: 9:07 AM, Height: 64 in, Weight: 162 lbs, BMI: 27.8, Temperature: 98.1 F, Pulse: 76 bpm, Respiratory Rate: 20 breaths/min, Blood Pressure: 149/71 mmHg, Capillary Blood Glucose: 128 mg/dl. Respiratory Normal work of breathing on room air. General Notes: 09/25/2022: Post wound 1/3 smaller with rubbery slough on surface. Lateral wound 1/2 previous size. Integumentary (Hair, Skin) Wound #1 status is Open. Original cause of wound was Hematoma. The date acquired was:  08/05/2022. The wound has been in treatment 2 weeks. The wound is located on the Left,Lateral Lower Leg. The wound measures 3.5cm length x 2cm width x 2cm depth; 5.498cm^2 area and 10.996cm^3 volume. There is Fat Layer (Subcutaneous Tissue) exposed. There is no tunneling noted, however, there is undermining starting at 12:00 and ending at 12:00 with a maximum distance of 2.5cm. There is a large amount of serosanguineous drainage noted. The wound margin is well defined and not attached to the wound base. There is large (67-100%) red granulation within the wound bed. There is a small (1-33%) amount of necrotic tissue within the wound bed including Adherent Slough. The periwound skin appearance had no abnormalities noted for texture. The periwound skin appearance had no abnormalities noted for moisture. The periwound skin appearance exhibited: Hemosiderin Staining. Periwound temperature was noted as No Abnormality. Wound #3 status is Open. Original cause of wound was Trauma. The date acquired was: 09/20/2022. The wound  is located on the Left,Posterior Lower Leg. The wound measures 2.2cm length x 2.1cm width x 0.1cm depth; 3.629cm^2 area and 0.363cm^3 volume. There is Fat Layer (Subcutaneous Tissue) exposed. There is no tunneling or undermining noted. There is a medium amount of serosanguineous drainage noted. The wound margin is flat and intact. There is small (1-33%) red granulation within the wound bed. There is a large (67-100%) amount of necrotic tissue within the wound bed including Adherent Slough. The periwound skin appearance had no abnormalities noted for texture. The periwound skin appearance had no abnormalities noted for moisture. The periwound skin appearance exhibited: Hemosiderin Staining. Periwound temperature was noted as No Abnormality. Assessment Active Problems ICD-10 Non-pressure chronic ulcer of other part of left lower leg with fat layer exposed Type 2 diabetes mellitus with other skin ulcer Long term (current) use of anticoagulants Chronic combined systolic (congestive) and diastolic (congestive) heart failure Chronic kidney disease, stage 3 unspecified Procedures Wound #3 Pre-procedure diagnosis of Wound #3 is an Infection - not elsewhere classified located on the Left,Posterior Lower Leg . There was a Selective/Open Wound Non-Viable Tissue Debridement with a total area of 3.63 sq cm performed by Duanne Guess, MD. With the following instrument(s): Curette to remove Non- Viable tissue/material. Material removed includes [facility] after achieving pain control using Lidocaine 5% topical ointment. No specimens were taken. A time out was conducted at 09:55, prior to the start of the procedure. A Minimum amount of bleeding was controlled with Pressure. The procedure was tolerated well. Post Debridement Measurements: 2.2cm length x 2.1cm width x 0.1cm depth; 0.363cm^3 volume. Character of Wound/Ulcer Post Debridement is improved. Post procedure Diagnosis Wound #3: Same as  Pre-Procedure General Notes: Scribed for Dr Lady Gary by Redmond Pulling, Rn. Plan Follow-up Appointments: Return Appointment in 1 week. - Dr. Lady Gary - room 2 7/22 at 9:15 AM Nurse Visit: - 7/12 at 8:00 AM room 3 7/25 at 11:30 AM room 2 Anesthetic: (In clinic) Topical Lidocaine 4% applied to wound bed Bathing/ Shower/ Hygiene: May shower with protection but do not get wound dressing(s) wet. Protect dressing(s) with water repellant cover (for example, large plastic bag) or a cast cover and may then take shower. Negative Presssure Wound Therapy: Medela Wound Vac continuously at 119mm/hg Black Foam Edema Control - Lymphedema / SCD / Other: Elevate legs to the level of the heart or above for 30 minutes daily and/or when sitting for 3-4 times a day throughout the day. Avoid standing for long periods of time. Exercise regularly DELSA, WALDER ([id number]) [id number] Page 7 of 9  Home Health: Wound #1 Left,Lateral Lower Leg: Admit to Home Health for skilled nursing wound care. May utilize formulary equivalent dressing for wound treatment orders unless otherwise specified. Dressing changes to be completed by Home Health on Monday / Wednesday / Friday except when patient has scheduled visit at [facility]. WOUND #1: - Lower Leg Wound Laterality: Left, Lateral Cleanser: Soap and Water 2 x Per Week/30 Days Discharge Instructions: May shower and wash wound with dial antibacterial soap and water prior to dressing change. Cleanser: Vashe 5.8 (oz) 2 x Per Week/30 Days Discharge Instructions: Cleanse the wound with Vashe prior to applying a clean dressing using gauze sponges, not tissue or cotton balls. Prim Dressing: NPWT 2 x Per Week/30 Days ary Com pression Wrap: Kerlix Roll 4.5x3.1 (in/yd) 2 x Per Week/30 Days Discharge Instructions: Apply Kerlix and Coban compression as directed. Com pression Wrap: Coban Self-Adherent Wrap 4x5 (in/yd) 2 x Per Week/30 Days Discharge  Instructions: Apply over Kerlix as directed. WOUND #3: - Lower Leg Wound Laterality: Left, Posterior Prim Dressing: Maxorb Extra Ag+ Alginate Dressing, 2x2 (in/in) 2 x Per Week/30 Days ary Discharge Instructions: Apply to wound bed as instructed Secondary Dressing: Woven Gauze Sponge, Non-Sterile 4x4 in 2 x Per Week/30 Days Discharge Instructions: Apply over primary dressing as directed. Com pression Wrap: Kerlix Roll 4.5x3.1 (in/yd) 2 x Per Week/30 Days Discharge Instructions: Apply Kerlix and Coban compression as directed. Com pression Wrap: Coban Self-Adherent Wrap 4x5 (in/yd) 2 x Per Week/30 Days Discharge Instructions: Apply over Kerlix as directed. Responding very well to treatment. 1. Debrided slough from post wound; no debridement to lateral wound. 2. Continue with Alginate Ag to post wound; negative pressure to lateral wound. 3. Kerlix and coban wrap. 4. RN visit on Thursday for vac change. 5. F/u x1 week. Electronic Signature(s) Signed: 10/02/2022 10:14:42 AM By: Duanne Guess MD FACS Previous Signature: 09/29/2022 4:29:32 PM Version By: Shawn Stall RN, BSN Entered By: Duanne Guess on 10/02/2022 10:14:41 -------------------------------------------------------------------------------- HxROS Details Patient Name: Date of Service: HA Long, Johnny 09/25/2022 9:15 A M Medical Record Number: [id number] Patient Account Number: [id number] Date of Birth/Sex: Treating RN: 03/16/1940 (82 y.o. F) Primary Care Provider: PA Zenovia Jordan, NO Other Clinician: Referring Provider: Treating Provider/Extender: Sherryl Manges in Treatment: 2 Information Obtained From Patient Cardiovascular Medical History: Positive for: Arrhythmia - A fib; Congestive Heart Failure; Hypertension Past Medical History Notes: High Cholesterol Endocrine Medical History: Positive for: Type II Diabetes - since 2019 Genitourinary Medical History: Past Medical History Notes: Kidney Disease LEON, GOODNOW  ([id number]) [id number] Page 8 of 9 Immunizations Pneumococcal Vaccine: Received Pneumococcal Vaccination: No Implantable Devices No devices added Family and Social History Hereditary Spherocytosis: No; Hypertension: No; Kidney Disease: No; Lung Disease: No; Seizures: No; Stroke: No; Thyroid Problems: No; Tuberculosis: No; Never smoker; Marital Status - Widowed; Alcohol Use: Never; Drug Use: No History; Caffeine Use: Never; Financial Concerns: No; Food, Clothing or Shelter Needs: No; Support System Lacking: No; Transportation Concerns: No Electronic Signature(s) Signed: 10/02/2022 10:26:58 AM By: Duanne Guess MD FACS Entered By: Duanne Guess on 10/02/2022 10:13:36 -------------------------------------------------------------------------------- SuperBill Details Patient Name: Date of Service: [name] Heinz, Breta 09/25/2022 Medical Record Number: [id number] Patient Account Number: [id number] Date of Birth/Sex: Treating RN: 1940-07-26 (83 y.o. 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 Z79.01 Long term (current) use of anticoagulants I50.42 Chronic combined systolic (congestive) and diastolic (congestive) heart failure N18.30 Chronic kidney disease, stage 3 unspecified Facility Procedures : CPT4 Code: [id number] Description: 97597 - DEBRIDE WOUND 1ST 20 SQ CM OR < ICD-10 Diagnosis Description L97.822 Non-pressure chronic ulcer of other part of left lower leg with fat layer expose Modifier: d Quantity: 1 : CPT4 Code: [id number] Description: 97607 NEG PRESS WND TX <=50 SQ CM Modifier: Quantity: 1 Physician Procedures : CPT4 Code Description Modifier [id number] 99214 - WC  PHYS LEVEL 4 - EST PT 25 ICD-10 Diagnosis Description L97.822 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  I50.42 Chronic combined systolic (congestive) and diastolic (congestive) heart failure Z79.01 Long term (current) use of anticoagulants Quantity: 1 : [id number] 97597 - WC PHYS DEBR WO ANESTH 20 SQ CM ICD-10 Diagnosis Description L97.822 Non-pressure chronic ulcer of other part of left lower leg with fat layer exposed Quantity: 1 Electronic Signature(s) [place], Aleesia ([id number]) [id number] Page 9 of 9 Signed: 10/02/2022 10:14:57 AM By: Duanne Guess MD FACS Previous Signature: 09/25/2022 3:58:48 PM Version By: Duanne Guess MD FACS Previous Signature: 09/25/2022 4:30:58 PM Version By: Redmond Pulling RN, BSN Previous Signature: 09/25/2022 11:59:45 AM Version By: Duanne Guess MD FACS Entered By: Duanne Guess on 10/02/2022 10:14:56

## 2022-09-25 NOTE — Progress Notes (Addendum)
[place], [place] ([id number]) [id number] Page 1 of 9 Visit Report for 09/25/2022 Arrival Information Details Patient Name: Date of Service: [name] Long, [name] Long 09/25/2022 9:15 A M Medical Record Number: [id number] Patient Account Number: [id number] Date of Birth/Sex: Treating RN: Feb 08, 1941 (82 y.o. F) Primary Care Sharonne Ricketts: PA TIENT, NO Other Clinician: Referring Alayna Mabe: Treating Kryssa Risenhoover/Extender: Sherryl Manges in Treatment: 2 Visit Information History Since Last Visit All ordered tests and consults were completed: No Patient Arrived: Dan Humphreys Added or deleted any medications: No Arrival Time: 09:05 Any new allergies or adverse reactions: No Accompanied By: Daughter Had a fall or experienced change in No Transfer Assistance: None activities of daily living that may affect Patient Identification Verified: Yes risk of falls: Secondary Verification Process Completed: Yes Signs or symptoms of abuse/neglect since last visito No Patient Requires Transmission-Based Precautions: No Hospitalized since last visit: No Patient Has Alerts: No Implantable device outside of the clinic excluding No cellular tissue based products placed in the center since last visit: Pain Present Now: No Electronic Signature(s) Signed: 09/25/2022 2:18:23 PM By: Dayton Scrape Entered By: Dayton Scrape on 09/25/2022 09:06:56 -------------------------------------------------------------------------------- Encounter Discharge Information Details Patient Name: Date of Service: [name] Long, [name] Long 09/25/2022 9:15 A M Medical Record Number: [id number] Patient Account Number: [id number] Date of Birth/Sex: Treating RN: Jul 22, 1940 (82 y.o. Orville Govern Primary Care Ella Guillotte: PA Zenovia Jordan, [place] Other Clinician: Referring Berle Fitz: Treating Baby Stairs/Extender: Sherryl Manges in Treatment: 2 Encounter Discharge Information Items Post Procedure Vitals Discharge Condition: Stable Temperature (F):  98.1 Ambulatory Status: Walker Pulse (bpm): 76 Discharge Destination: Home Respiratory Rate (breaths/min): 18 Transportation: Private Auto Blood Pressure (mmHg): 149/71 Accompanied By: daughter Schedule Follow-up Appointment: Yes Clinical Summary of Care: Patient Declined Electronic Signature(s) Signed: 09/25/2022 4:30:58 PM By: Redmond Pulling RN, BSN Entered By: Redmond Pulling on 09/25/2022 15:06:30 [place], [name] ([id number]) [id number] Page 2 of 9 -------------------------------------------------------------------------------- Lower Extremity Assessment Details Patient Name: Date of Service: [name] Long, [name] Long 09/25/2022 9:15 A M Medical Record Number: [id number] Patient Account Number: [id number] Date of Birth/Sex: Treating RN: 03/19/1940 (82 y.o. Orville Govern Primary Care Antron Seth: PA Zenovia Jordan, [place] Other Clinician: Referring Isaiahs Chancy: Treating Tasheka Houseman/Extender: Sherryl Manges in Treatment: 2 Edema Assessment Assessed: [Left: No] [Right: No] Edema: [Left: Ye] [Right: s] Calf Left: Right: Point of Measurement: From Medial Instep 34.4 cm Ankle Left: Right: Point of Measurement: From Medial Instep 25 cm Vascular Assessment Pulses: Dorsalis Pedis Palpable: [Left:Yes] Extremity colors, hair growth, and conditions: Extremity Color: [Left:Hyperpigmented] Hair Growth on Extremity: [Left:No] Temperature of Extremity: [Left:Warm < 3 seconds] Electronic Signature(s) Signed: 09/25/2022 4:30:58 PM By: Redmond Pulling RN, BSN Entered By: Redmond Pulling on 09/25/2022 09:50:59 -------------------------------------------------------------------------------- Multi Wound Chart Details Patient Name: Date of Service: [name] Long, [name] Long 09/25/2022 9:15 A M Medical Record Number: [id number] Patient Account Number: [id number] Date of Birth/Sex: Treating RN: 11/27/1940 (82 y.o. F) Primary Care Saranya Harlin: PA TIENT, NO Other Clinician: Referring Dalisa Forrer: Treating  Kinsly Hild/Extender: Sherryl Manges in Treatment: 2 Vital Signs Height(in): 64 Capillary Blood Glucose(mg/dl): 099 Weight(lbs): 833 Pulse(bpm): 76 Body Mass Index(BMI): 27.8 Blood Pressure(mmHg): 149/71 Temperature(F): 98.1 Respiratory Rate(breaths/min): 20 [1:Photos:] [N/A:N/A [id number] Page 3 of 9] Left, Lateral Lower Leg Left, Posterior Lower Leg N/A Wound Location: Hematoma Trauma N/A Wounding Event: Diabetic Wound/Ulcer of the Lower Infection - not elsewhere classified N/A Primary Etiology: Extremity Arrhythmia, Congestive Heart Failure, Arrhythmia, Congestive Heart Failure, N/A Comorbid History: Hypertension, Type II Diabetes Hypertension, Type II Diabetes 08/05/2022 09/20/2022 N/A Date Acquired: 2 0 N/A Weeks of Treatment: Open Open N/A Wound Status:  No No N/A Wound Recurrence: Yes No N/A Clustered Wound: 2 N/A N/A Clustered Quantity: 3.5x2x2 2.2x2.1x0.1 N/A Measurements L x W x D (cm) 5.498 3.629 N/A A (cm) : rea 10.996 0.363 N/A Volume (cm) : 84.40% 16.30% N/A % Reduction in A rea: 65.40% 16.40% N/A % Reduction in Volume: 12 Starting Position 1 (o'clock): 12 Ending Position 1 (o'clock): 2.5 Maximum Distance 1 (cm): Yes No N/A Undermining: Grade 2 Full Thickness Without Exposed N/A Classification: Support Structures Large Medium N/A Exudate A mount: Serosanguineous Serosanguineous N/A Exudate Type: red, brown red, brown N/A Exudate Color: Well defined, not attached Flat and Intact N/A Wound Margin: Large (67-100%) Small (1-33%) N/A Granulation A mount: Red Red N/A Granulation Quality: Small (1-33%) Large (67-100%) N/A Necrotic A mount: Fat Layer (Subcutaneous Tissue): Yes Fat Layer (Subcutaneous Tissue): Yes N/A Exposed Structures: Fascia: No Fascia: No Tendon: No Tendon: No Muscle: No Muscle: No Joint: No Joint: No Bone: No Bone: No None Small (1-33%) N/A Epithelialization: N/A Debridement -  Selective/Open Wound N/A Debridement: Pre-procedure Verification/Time Out N/A 09:55 N/A Taken: N/A Lidocaine 5% topical ointment N/A Pain Control: N/A Slough N/A Tissue Debrided: N/A Non-Viable Tissue N/A Level: N/A 3.63 N/A Debridement A (sq cm): rea N/A Curette N/A Instrument: N/A Minimum N/A Bleeding: N/A Pressure N/A Hemostasis A chieved: N/A Procedure was tolerated well N/A Debridement Treatment Response: N/A 2.2x2.1x0.1 N/A Post Debridement Measurements L x W x D (cm) N/A 0.363 N/A Post Debridement Volume: (cm) Rash: No No Abnormalities Noted N/A Periwound Skin Texture: Maceration: No No Abnormalities Noted N/A Periwound Skin Moisture: Dry/Scaly: No Hemosiderin Staining: Yes Hemosiderin Staining: Yes N/A Periwound Skin Color: No Abnormality No Abnormality N/A Temperature: Negative Pressure Wound Therapy Debridement N/A Procedures Performed: Maintenance (NPWT) Treatment Notes Wound #1 (Lower Leg) Wound Laterality: Left, Lateral Cleanser Soap and Water Discharge Instruction: May shower and wash wound with dial antibacterial soap and water prior to dressing change. Vashe 5.8 (oz) Discharge Instruction: Cleanse the wound with Vashe prior to applying a clean dressing using gauze sponges, not tissue or cotton balls. Peri-Wound Care Topical Primary Dressing NPWT Secondary Dressing SIMONA, ROCQUE ([id number]) [id number] Page 4 of 9 Secured With Compression Wrap Kerlix Roll 4.5x3.1 (in/yd) Discharge Instruction: Apply Kerlix and Coban compression as directed. Coban Self-Adherent Wrap 4x5 (in/yd) Discharge Instruction: Apply over Kerlix as directed. Compression Stockings Add-Ons Wound #3 (Lower Leg) Wound Laterality: Left, Posterior Cleanser Peri-Wound Care Topical Primary Dressing Maxorb Extra Ag+ Alginate Dressing, 2x2 (in/in) Discharge Instruction: Apply to wound bed as instructed Secondary Dressing Woven Gauze Sponge,  Non-Sterile 4x4 in Discharge Instruction: Apply over primary dressing as directed. Secured With Compression Wrap Kerlix Roll 4.5x3.1 (in/yd) Discharge Instruction: Apply Kerlix and Coban compression as directed. Coban Self-Adherent Wrap 4x5 (in/yd) Discharge Instruction: Apply over Kerlix as directed. Compression Stockings Add-Ons Electronic Signature(s) Signed: 10/02/2022 10:12:54 AM By: Duanne Guess MD FACS Entered By: Duanne Guess on 10/02/2022 10:12:54 -------------------------------------------------------------------------------- Multi-Disciplinary Care Plan Details Patient Name: Date of Service: [name] Long, [name] Long 09/25/2022 9:15 A M Medical Record Number: [id number] Patient Account Number: [id number] Date of Birth/Sex: Treating RN: 29-Jun-1940 (82 y.o. Orville Govern Primary Care Carie Kapuscinski: PA Zenovia Jordan, [place] Other Clinician: Referring Dontae Minerva: Treating Saloni Lablanc/Extender: Sherryl Manges in Treatment: 2 Multidisciplinary Care Plan reviewed with physician Active Inactive Wound/Skin Impairment Nursing Diagnoses: Impaired tissue integrity Goals: Patient/caregiver will verbalize understanding of skin care regimen Date Initiated: 09/06/2022 Target Resolution Date: 11/04/2022 FREDONIA, CASALINO ([id number]) [id number] Page 5 of 9 Goal Status: Active Interventions: Assess patient/caregiver ability to obtain necessary  supplies Assess patient/caregiver ability to perform ulcer/skin care regimen upon admission and as needed Assess ulceration(s) every visit Provide education on ulcer and skin care Screen for HBO Treatment Activities: Skin care regimen initiated : 09/06/2022 Topical wound management initiated : 09/06/2022 Notes: Electronic Signature(s) Signed: 09/25/2022 4:30:58 PM By: Redmond Pulling RN, BSN Entered By: Redmond Pulling on 09/25/2022 09:56:23 -------------------------------------------------------------------------------- Negative Pressure  Wound Therapy Maintenance (NPWT) Details Patient Name: Date of Service: [name] Long, [name] Long 09/25/2022 9:15 A M Medical Record Number: [id number] Patient Account Number: [id number] Date of Birth/Sex: Treating RN: Sep 17, 1940 (82 y.o. Orville Govern Primary Care Neno Hohensee: PA Zenovia Jordan, [place] Other Clinician: Referring Jeffie Spivack: Treating Gabriela Giannelli/Extender: Sherryl Manges in Treatment: 2 NPWT Maintenance Performed for: Wound #1 Left, Lateral Lower Leg Additional Injuries Covered: Yes Performed By: Redmond Pulling, RN Type: Medela [employer] Coverage Size (sq cm): 7 Pressure Type: Constant Pressure Setting: 125 mmHG Drain Type: None Primary Contact: None Sponge/Dressing Type: Foam- Black Date Initiated: 09/20/2022 Dressing Removed: Yes Quantity of Sponges/Gauze Removed: 1 Canister Changed: No Canister Exudate Volume: 50 Dressing Reapplied: Yes Quantity of Sponges/Gauze Inserted: 1 Respones T Treatment: o pt tolerated well Days On NPWT : 6 Post Procedure Diagnosis Same as Pre-procedure Electronic Signature(s) Signed: 09/25/2022 4:30:58 PM By: Redmond Pulling RN, BSN Entered By: Redmond Pulling on 09/25/2022 15:05:16 -------------------------------------------------------------------------------- Pain Assessment Details Patient Name: Date of Service: [name] Long, [name] Long 09/25/2022 9:15 A M Medical Record Number: [id number] Patient Account Number: [id number] TAMBRIA, PFANNENSTIEL ([id number]) [id number] Page 6 of 9 Date of Birth/Sex: Treating RN: [birth year] (82 y.o. F) Primary Care Ellarae Nevitt: PA Zenovia Jordan, NO Other Clinician: Referring Rashiya Lofland: Treating Etheleen Valtierra/Extender: Sherryl Manges in Treatment: 2 Active Problems Location of Pain Severity and Description of Pain Patient Has Paino No Site Locations Pain Management and Medication Current Pain Management: Electronic Signature(s) Signed: 09/25/2022 2:18:23 PM By: Dayton Scrape Entered By: Dayton Scrape on 09/25/2022  09:08:26 -------------------------------------------------------------------------------- Patient/Caregiver Education Details Patient Name: Date of Service: [name] Long, [name] Long 7/22/2024andnbsp9:15 A M Medical Record Number: [id number] Patient Account Number: [id number] Date of Birth/Gender: Treating RN: Feb 18, 1941 (82 y.o. Orville Govern Primary Care Physician: PA Zenovia Jordan, [place] Other Clinician: Referring Physician: Treating Physician/Extender: Sherryl Manges in Treatment: 2 Education Assessment Education Provided To: Patient Education Topics Provided Wound/Skin Impairment: Methods: Explain/Verbal Responses: State content correctly Electronic Signature(s) Signed: 09/25/2022 4:30:58 PM By: Redmond Pulling RN, BSN Entered By: Redmond Pulling on 09/25/2022 09:56:59 [place], [name] ([id number]) [id number] Page 7 of 9 -------------------------------------------------------------------------------- Wound Assessment Details Patient Name: Date of Service: [name] Long, [name] Long 09/25/2022 9:15 A M Medical Record Number: [id number] Patient Account Number: [id number] Date of Birth/Sex: Treating RN: 15-Feb-1941 (82 y.o. F) Primary Care Charmian Forbis: PA TIENT, NO Other Clinician: Referring Jakyren Fluegge: Treating Shiven Junious/Extender: Sherryl Manges in Treatment: 2 Wound Status Wound Number: 1 Primary Diabetic Wound/Ulcer of the Lower Extremity Etiology: Wound Location: Left, Lateral Lower Leg Wound Status: Open Wounding Event: Hematoma Comorbid Arrhythmia, Congestive Heart Failure, Hypertension, Type II Date Acquired: 08/05/2022 History: Diabetes Weeks Of Treatment: 2 Clustered Wound: Yes Photos Wound Measurements Length: (cm) 3.5 Width: (cm) 2 Depth: (cm) 2 Clustered Quantity: 2 Area: (cm) 5.498 Volume: (cm) 10.996 % Reduction in Area: 84.4% % Reduction in Volume: 65.4% Epithelialization: None Tunneling: No Undermining: Yes Starting Position (o'clock): 12 Ending  Position (o'clock): 12 Maximum Distance: (cm) 2.5 Wound Description Classification: Grade 2 Wound Margin: Well defined, not attached Exudate Amount: Large Exudate Type: Serosanguineous Exudate Color: red, brown Foul Odor After Cleansing: No Slough/Fibrino Yes Wound Bed Granulation Amount: Large (67-100%) Exposed  Structure Granulation Quality: Red Fascia Exposed: No Necrotic Amount: Small (1-33%) Fat Layer (Subcutaneous Tissue) Exposed: Yes Necrotic Quality: Adherent Slough Tendon Exposed: No Muscle Exposed: No Joint Exposed: No Bone Exposed: No Periwound Skin Texture Texture Color No Abnormalities Noted: Yes No Abnormalities Noted: No Hemosiderin Staining: Yes Moisture No Abnormalities Noted: Yes Temperature / Pain Temperature: No Abnormality Electronic Signature(s) Signed: 09/25/2022 4:30:58 PM By: Redmond Pulling RN, BSN Entered By: Redmond Pulling on 09/25/2022 09:49:24 [place], [name] ([id number]) [id number] Page 8 of 9 -------------------------------------------------------------------------------- Wound Assessment Details Patient Name: Date of Service: [name] Long, [name] Long 09/25/2022 9:15 A M Medical Record Number: [id number] Patient Account Number: [id number] Date of Birth/Sex: Treating RN: Sep 11, 1940 (82 y.o. F) Primary Care Kendryck Lacroix: PA TIENT, NO Other Clinician: Referring Aala Ransom: Treating Leslieanne Cobarrubias/Extender: Sherryl Manges in Treatment: 2 Wound Status Wound Number: 3 Primary Infection - not elsewhere classified Etiology: Wound Location: Left, Posterior Lower Leg Wound Status: Open Wounding Event: Trauma Comorbid Arrhythmia, Congestive Heart Failure, Hypertension, Type II Date Acquired: 09/20/2022 History: Diabetes Weeks Of Treatment: 0 Clustered Wound: No Photos Wound Measurements Length: (cm) 2.2 Width: (cm) 2.1 Depth: (cm) 0.1 Area: (cm) 3.629 Volume: (cm) 0.363 % Reduction in Area: 16.3% % Reduction in Volume:  16.4% Epithelialization: Small (1-33%) Tunneling: No Undermining: No Wound Description Classification: Full Thickness Without Exposed Support Structures Wound Margin: Flat and Intact Exudate Amount: Medium Exudate Type: Serosanguineous Exudate Color: red, brown Foul Odor After Cleansing: No Slough/Fibrino No Wound Bed Granulation Amount: Small (1-33%) Exposed Structure Granulation Quality: Red Fascia Exposed: No Necrotic Amount: Large (67-100%) Fat Layer (Subcutaneous Tissue) Exposed: Yes Necrotic Quality: Adherent Slough Tendon Exposed: No Muscle Exposed: No Joint Exposed: No Bone Exposed: No Periwound Skin Texture Texture Color No Abnormalities Noted: Yes No Abnormalities Noted: No Hemosiderin Staining: Yes Moisture No Abnormalities Noted: Yes Temperature / Pain Temperature: No Abnormality Electronic Signature(s) Signed: 09/25/2022 4:30:58 PM By: Redmond Pulling RN, BSN Entered By: Redmond Pulling on 09/25/2022 09:50:19 [place], [name] ([id number]) [id number] Page 9 of 9 -------------------------------------------------------------------------------- Vitals Details Patient Name: Date of Service: [name] Long, [name] Long 09/25/2022 9:15 A M Medical Record Number: [id number] Patient Account Number: [id number] Date of Birth/Sex: Treating RN: [birth year] (82 y.o. F) Primary Care Mattalynn Crandle: PA TIENT, NO Other Clinician: Referring Aliceson Dolbow: Treating Jaskirat Zertuche/Extender: Sherryl Manges in Treatment: 2 Vital Signs Time Taken: 09:07 Temperature (F): 98.1 Height (in): 64 Pulse (bpm): 76 Weight (lbs): 162 Respiratory Rate (breaths/min): 20 Body Mass Index (BMI): 27.8 Blood Pressure (mmHg): 149/71 Capillary Blood Glucose (mg/dl): 629 Reference Range: 80 - 120 mg / dl Electronic Signature(s) Signed: 09/25/2022 2:18:23 PM By: Dayton Scrape Entered By: Dayton Scrape on 09/25/2022 09:08:09

## 2022-09-28 ENCOUNTER — Encounter (HOSPITAL_BASED_OUTPATIENT_CLINIC_OR_DEPARTMENT_OTHER): Payer: Medicare Other | Admitting: General Surgery

## 2022-09-28 DIAGNOSIS — E11622 Type 2 diabetes mellitus with other skin ulcer: Secondary | ICD-10-CM | POA: Diagnosis not present

## 2022-09-29 ENCOUNTER — Ambulatory Visit (HOSPITAL_COMMUNITY): Payer: Medicare Other | Attending: Cardiovascular Disease

## 2022-09-29 DIAGNOSIS — I340 Nonrheumatic mitral (valve) insufficiency: Secondary | ICD-10-CM | POA: Insufficient documentation

## 2022-09-29 LAB — ECHOCARDIOGRAM COMPLETE
Area-P 1/2: 4.66 cm2
MV M vel: 4.57 m/s
MV Peak grad: 83.4 mmHg
Radius: 0.7 cm
S' Lateral: 2.6 cm

## 2022-09-29 NOTE — Progress Notes (Signed)
[place], [place] ([id number]) [id number] Page 1 of 1 Visit Report for 09/28/2022 SuperBill Details Patient Name: Date of Service: [name] Long, [name] Long 09/28/2022 Medical Record Number: [id number] Patient Account Number: [id number] Date of Birth/Sex: Treating RN: 1940-12-14 (82 y.o. 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 Z79.01 Long term (current) use of anticoagulants I50.42 Chronic combined systolic (congestive) and diastolic (congestive) heart failure N18.30 Chronic kidney disease, stage 3 unspecified Facility Procedures CPT4 Code Description Modifier Quantity [phone] - WOUND VAC-50 SQ CM OR LESS 1 ICD-10 Diagnosis Description L97.822 Non-pressure chronic ulcer of other part of left lower leg with fat layer exposed Physician Procedures Quantity CPT4 Code Description Modifier [id number] 97605 - WC PHYS TX WOUND VAC < 50 SQ CM 1 ICD-10 Diagnosis Description L97.822 Non-pressure chronic ulcer of other part of left lower leg with fat layer exposed Electronic Signature(s) Signed: 09/28/2022 12:11:15 PM By: Duanne Guess MD FACS Signed: 09/29/2022 12:49:07 PM By: Brenton Grills Entered By: Brenton Grills on 09/28/2022 12:02:53

## 2022-09-29 NOTE — Progress Notes (Signed)
[place], [place] ([id number]) [id number] Page 1 of 5 Visit Report for 09/28/2022 Arrival Information Details Patient Name: Date of Service: [name] Long, [name] Long 09/28/2022 11:00 A M Medical Record Number: [id number] Patient Account Number: [id number] Date of Birth/Sex: Treating RN: [birth year] (82 y.o. [name] Long Primary Care [name] Long: PA [name] Long, NO Other Clinician: Referring Dalonda Simoni: Treating Masao Junker/Extender: Sherryl Manges in Treatment: 3 Visit Information History Since Last Visit All ordered tests and consults were completed: Yes Patient Arrived: [name] Long Added or deleted any medications: No Arrival Time: 11:57 Any new allergies or adverse reactions: No Accompanied By: daughter Had a fall or experienced change in No Transfer Assistance: EasyPivot Patient Lift activities of daily living that may affect Patient Requires Transmission-Based Precautions: No risk of falls: Patient Has Alerts: No Signs or symptoms of abuse/neglect since last visito No Hospitalized since last visit: No Implantable device outside of the clinic excluding No cellular tissue based products placed in the center since last visit: Has Dressing in Place as Prescribed: Yes Pain Present Now: No Electronic Signature(s) Signed: 09/29/2022 12:49:07 PM By: Brenton Grills Entered By: Brenton Grills on 09/28/2022 11:58:16 -------------------------------------------------------------------------------- Encounter Discharge Information Details Patient Name: Date of Service: [name] Long, [name] Long 09/28/2022 11:00 A M Medical Record Number: [id number] Patient Account Number: [id number] Date of Birth/Sex: Treating RN: [birth year] Long (82 y.o. [name] Long Primary Care Astin Rape: PA [name] Long, NO Other Clinician: Referring Dennie Vecchio: Treating Lisle Skillman/Extender: Sherryl Manges in Treatment: 3 Encounter Discharge Information Items Discharge Condition: Stable Ambulatory Status: Walker Discharge  Destination: Home Transportation: Private Auto Accompanied By: daughter Schedule Follow-up Appointment: Yes Clinical Summary of Care: Patient Declined Electronic Signature(s) Signed: 09/29/2022 12:49:07 PM By: Brenton Grills Entered By: Brenton Grills on 09/28/2022 12:02:43 [name] Long ([id number]) [id number] Page 2 of 5 -------------------------------------------------------------------------------- Negative Pressure Wound Therapy Maintenance (NPWT) Details Patient Name: Date of Service: [name] Long, [name] Long 09/28/2022 11:00 A M Medical Record Number: [id number] Patient Account Number: [id number] Date of Birth/Sex: Treating RN: [name] Long (82 y.o. [name] Long Primary Care Jhair Witherington: PA [name] Long, NO Other Clinician: Referring Less Woolsey: Treating Purva Vessell/Extender: Sherryl Manges in Treatment: 3 NPWT Maintenance Performed for: Wound #1 Left, Lateral Lower Leg Additional Injuries Covered: Yes Performed By: Brenton Grills, RN Type: VAC System Coverage Size (sq cm): 7 Pressure Type: Constant Pressure Setting: 125 mmHG Drain Type: None Primary Contact: None Sponge/Dressing Type: Foam- Black Date Initiated: 09/20/2022 Dressing Removed: Yes Quantity of Sponges/Gauze Removed: 1 Canister Changed: Yes Canister Exudate Volume: 50 Dressing Reapplied: Yes Quantity of Sponges/Gauze Inserted: 1 Days On NPWT : 9 Electronic Signature(s) Signed: 09/29/2022 12:49:07 PM By: Brenton Grills Entered By: Brenton Grills on 09/28/2022 12:01:52 -------------------------------------------------------------------------------- Patient/Caregiver Education Details Patient Name: Date of Service: [name] Long, [name] Long 7/25/2024andnbsp11:00 A M Medical Record Number: [id number] Patient Account Number: [id number] Date of Birth/Gender: Treating RN: [birth year] (82 y.o. [name] Long Primary Care Physician: PA [name] Long, [place] Other Clinician: Referring Physician: Treating Physician/Extender:  Sherryl Manges in Treatment: 3 Education Assessment Education Provided To: Patient and Caregiver Education Topics Provided Wound/Skin Impairment: Methods: Explain/Verbal Responses: State content correctly Electronic Signature(s) Signed: 09/29/2022 12:49:07 PM By: Brenton Grills Entered By: Brenton Grills on 09/28/2022 12:02:18 [name] Long ([id number]) [id number] Page 3 of 5 -------------------------------------------------------------------------------- Wound Assessment Details Patient Name: Date of Service: [name] Long, [name] Long 09/28/2022 11:00 A M Medical Record Number: [id number] Patient Account Number: [id number] Date of Birth/Sex: Treating RN: [birth year]Long (82 y.o. [name] Long Primary Care Kiaan Overholser: PA [name] Long, NO Other Clinician: Referring Jordain Radin: Treating Llesenia Fogal/Extender: Sherryl Manges in Treatment: 3 Wound Status Wound  Number: 1 Primary Diabetic Wound/Ulcer of the Lower Extremity Etiology: Wound Location: Left, Lateral Lower Leg Wound Status: Open Wounding Event: Hematoma Comorbid Arrhythmia, Congestive Heart Failure, Hypertension, Type II Date Acquired: 08/05/2022 History: Diabetes Weeks Of Treatment: 3 Clustered Wound: Yes Wound Measurements Length: (cm) 3.5 Width: (cm) 2 Depth: (cm) 2 Clustered Quantity: 2 Area: (cm) 5.498 Volume: (cm) 10.996 % Reduction in Area: 84.4% % Reduction in Volume: 65.4% Epithelialization: None Tunneling: Yes Position (o'clock): 10 Maximum Distance: (cm) 2 Undermining: Yes Starting Position (o'clock): 12 Ending Position (o'clock): 12 Maximum Distance: (cm) 1.2 Wound Description Classification: Grade 2 Wound Margin: Well defined, not attached Exudate Amount: Large Exudate Type: Serosanguineous Exudate Color: red, brown Foul Odor After Cleansing: No Slough/Fibrino Yes Wound Bed Granulation Amount: Large (67-100%) Exposed Structure Granulation Quality: Red Fascia Exposed:  No Necrotic Amount: Small (1-33%) Fat Layer (Subcutaneous Tissue) Exposed: Yes Necrotic Quality: Adherent Slough Tendon Exposed: No Muscle Exposed: No Joint Exposed: No Bone Exposed: No Periwound Skin Texture Texture Color No Abnormalities Noted: Yes No Abnormalities Noted: No Hemosiderin Staining: Yes Moisture No Abnormalities Noted: Yes Temperature / Pain Temperature: No Abnormality Treatment Notes Wound #1 (Lower Leg) Wound Laterality: Left, Lateral Cleanser Soap and Water Discharge Instruction: May shower and wash wound with dial antibacterial soap and water prior to dressing change. Vashe 5.8 (oz) Discharge Instruction: Cleanse the wound with Vashe prior to applying a clean dressing using gauze sponges, not tissue or cotton balls. Peri-Wound Care Topical [place], [place] ([id number]) [id number] Page 4 of 5 Primary Dressing NPWT Secondary Dressing Secured With Compression Wrap Kerlix Roll 4.5x3.1 (in/yd) Discharge Instruction: Apply Kerlix and Coban compression as directed. Coban Self-Adherent Wrap 4x5 (in/yd) Discharge Instruction: Apply over Kerlix as directed. Compression Stockings Add-Ons Electronic Signature(s) Signed: 09/29/2022 12:49:07 PM By: Brenton Grills Entered By: Brenton Grills on 09/28/2022 12:00:11 -------------------------------------------------------------------------------- Wound Assessment Details Patient Name: Date of Service: [name] Long, [name] Long 09/28/2022 11:00 A M Medical Record Number: [id number] Patient Account Number: [id number] Date of Birth/Sex: Treating RN: [birth year], Long (82 y.o. [name] Long Primary Care Dynastee Brummell: PA [name] Long, NO Other Clinician: Referring Gwyneth Fernandez: Treating Analeia Ismael/Extender: Sherryl Manges in Treatment: 3 Wound Status Wound Number: 3 Primary Infection - not elsewhere classified Etiology: Wound Location: Left, Posterior Lower Leg Wound Status: Open Wounding Event: Trauma Comorbid  Arrhythmia, Congestive Heart Failure, Hypertension, Type II Date Acquired: 09/20/2022 History: Diabetes Weeks Of Treatment: 1 Clustered Wound: No Wound Measurements Length: (cm) 2.2 Width: (cm) 2.1 Depth: (cm) 0.1 Area: (cm) 3.629 Volume: (cm) 0.363 % Reduction in Area: 16.3% % Reduction in Volume: 16.4% Epithelialization: Small (1-33%) Tunneling: No Undermining: No Wound Description Classification: Full Thickness Without Exposed Support Structures Wound Margin: Flat and Intact Exudate Amount: Medium Exudate Type: Serosanguineous Exudate Color: red, brown Foul Odor After Cleansing: No Slough/Fibrino No Wound Bed Granulation Amount: Small (1-33%) Exposed Structure Granulation Quality: Red Fascia Exposed: No Necrotic Amount: Large (67-100%) Fat Layer (Subcutaneous Tissue) Exposed: Yes Necrotic Quality: Adherent Slough Tendon Exposed: No Muscle Exposed: No Joint Exposed: No Bone Exposed: No Periwound Skin Texture Texture Color No Abnormalities Noted: Yes No Abnormalities Noted: No Hemosiderin Staining: Yes Moisture No Abnormalities Noted: Yes Temperature / Pain Temperature: No Abnormality Blahut, Sandria ([id number]) [id number] Page 5 of 5 Treatment Notes Wound #3 (Lower Leg) Wound Laterality: Left, Posterior Cleanser Peri-Wound Care Topical Primary Dressing Maxorb Extra Ag+ Alginate Dressing, 2x2 (in/in) Discharge Instruction: Apply to wound bed as instructed Secondary Dressing Woven Gauze Sponge, Non-Sterile 4x4 in Discharge Instruction: Apply over primary dressing as directed. Secured With Compression  Wrap Kerlix Roll 4.5x3.1 (in/yd) Discharge Instruction: Apply Kerlix and Coban compression as directed. Coban Self-Adherent Wrap 4x5 (in/yd) Discharge Instruction: Apply over Kerlix as directed. Compression Stockings Add-Ons Electronic Signature(s) Signed: 09/29/2022 12:49:07 PM By: Brenton Grills Entered By: Brenton Grills on  09/28/2022 12:00:23 -------------------------------------------------------------------------------- Vitals Details Patient Name: Date of Service: [name] Long, [name] Long 09/28/2022 11:00 A M Medical Record Number: [id number] Patient Account Number: [id number] Date of Birth/Sex: Treating RN: Long[birth year] (82 y.o. [name] Long Primary Care Atia Haupt: PA [name] Long, NO Other Clinician: Referring Degan Hanser: Treating Khaliya Golinski/Extender: Sherryl Manges in Treatment: 3 Vital Signs Time Taken: 11:30 Temperature (F): 98.1 Height (in): 64 Pulse (bpm): 76 Weight (lbs): 162 Respiratory Rate (breaths/min): 20 Body Mass Index (BMI): 27.8 Blood Pressure (mmHg): 148/71 Capillary Blood Glucose (mg/dl): 409 Reference Range: 80 - 120 mg / dl Electronic Signature(s) Signed: 09/29/2022 12:49:07 PM By: Brenton Grills Entered By: Brenton Grills on 09/28/2022 11:58:57

## 2022-10-02 ENCOUNTER — Encounter (HOSPITAL_BASED_OUTPATIENT_CLINIC_OR_DEPARTMENT_OTHER): Payer: Medicare Other | Admitting: General Surgery

## 2022-10-02 DIAGNOSIS — E11622 Type 2 diabetes mellitus with other skin ulcer: Secondary | ICD-10-CM | POA: Diagnosis not present

## 2022-10-03 NOTE — Progress Notes (Signed)
STEPHANYE, SCOGIN ([id number]) [id number] Page 1 of 9 Visit Report for 10/02/2022 Chief Complaint Document Details Patient Name: Date of Service: [name] Long, [name] Long 10/02/2022 9:00 A M Medical Record Number: [id number] Patient Account Number: [id number] Date of Birth/Sex: Treating RN: 05-19-1940 (82 y.o. [name] Long Primary Care Provider: PA Zenovia Jordan, [place] Other Clinician: Referring Provider: Treating Provider/Extender: Sherryl Manges in Treatment: 3 Information Obtained from: Patient Chief Complaint Patient seen for complaints of Non-Healing Wound. Electronic Signature(s) Signed: 10/02/2022 9:42:31 AM By: Duanne Guess MD FACS Entered By: Duanne Guess on 10/02/2022 09:42:31 -------------------------------------------------------------------------------- Debridement Details Patient Name: Date of Service: [name], Yvana 10/02/2022 9:00 A M Medical Record Number: [id number] Patient Account Number: [id number] Date of Birth/Sex: Treating RN: 11-25-1940 (83 y.o. [name] Long Primary Care Provider: PA Zenovia Jordan, [place] Other Clinician: Referring Provider: Treating Provider/Extender: Sherryl Manges in Treatment: 3 Debridement Performed for Assessment: Wound #3 Left,Posterior Lower Leg Performed By: Physician Duanne Guess, MD Debridement Type: Debridement Level of Consciousness (Pre-procedure): Awake and Alert Pre-procedure Verification/Time Out Yes - 09:35 Taken: Start Time: 09:35 Pain Control: Lidocaine 4% T opical Solution Percent of Wound Bed Debrided: 100% T Area Debrided (cm): otal 4.24 Tissue and other material debrided: Non-Viable, Slough, Slough Level: Non-Viable Tissue Debridement Description: Selective/Open Wound Instrument: Curette Bleeding: Minimum Hemostasis Achieved: Pressure Procedural Pain: 0 Post Procedural Pain: 0 Response to Treatment: Procedure was tolerated well Level of Consciousness (Post- Awake and  Alert procedure): Post Debridement Measurements of Total Wound Length: (cm) 1.8 Width: (cm) 3 Depth: (cm) 0.3 Volume: (cm) 1.272 Character of Wound/Ulcer Post Debridement: Improved Post Procedure Diagnosis [place], [name] Long ([id number]) [id number] Page 2 of 9 Same as Pre-procedure Notes Scribed for Dr Lady Gary by [name], RN Electronic Signature(s) Signed: 10/02/2022 10:26:58 AM By: Duanne Guess MD FACS Signed: 10/02/2022 5:48:36 PM By: [name] RN, BSN Entered By: [name] on 10/02/2022 09:39:04 -------------------------------------------------------------------------------- HPI Details Patient Name: Date of Service: [name], Shalea 10/02/2022 9:00 A M Medical Record Number: [id number] Patient Account Number: [id number] Date of Birth/Sex: Treating RN: [birth year] (82 y.o. [name] Long Primary Care Provider: PA Zenovia Jordan, [place] Other Clinician: Referring Provider: Treating Provider/Extender: Sherryl Manges in Treatment: 3 History of Present Illness HPI Description: ADMISSION 09/06/2022 This is an 82 year old type II diabetic (no A1c to review and patient is unaware of her last value) with a history of atrial fibrillation on chronic anticoagulant therapy, lower extremity edema, congestive heart failure, and stage III kidney disease. She divides her time between her home in [place] and here in [place] where her daughter lives. While in [place], she struck her leg on her bed frame and developed a large hematoma. She was evaluated in the emergency department there and imaging was negative for any deeper injury, such as a fracture. She returned to [facility] and has had an urgent care and emergency department visit to evaluate the site. As there had been no significant improvement in her wound, she was referred to the wound care center for further evaluation and management. I am unable to see any evidence of an attempt to evacuate the  hematoma in the electronic medical record.. There are 2 openings in her skin, but they connect to each other with significant tunneling and undermining. I am unsure if these opened spontaneously or there was a non- documented attempt to drain the hematoma site. The majority of her left lower leg from below the tibial tuberosity to just above the ankle and from the anterior tibia wrapping laterally and posteriorly  to nearly the medial aspect of her leg is involved. There is a foul odor coming from her wound. There is no frank pus but there is significant retained clot that has likely served as culture media. ABI in clinic today was 0.84. 09/08/2022: The patient came in today as an add-on due to bloody drainage that was saturating her dressing. On inspection, it appeared that there was no frank fresh blood and no active site of bleeding identified; it appears that more of the hematoma was liquefied and draining from her wound. 09/13/2022: The wound has cleaned up considerably. There is no longer any foul odor and the drainage has decreased significantly. The patient is also complaining of less pain. There is still some nonviable subcutaneous tissue and fat present but overall everything is improved from the last visit. Her culture grew out a polymicrobial population of multiple drug-resistant organisms. She is currently taking Augmentin and levofloxacin as suggested by the culture data. 09/20/2022: The cavity is contracting nicely. There is no longer a connection between the posterior wound and the lateral leg wound. The posterior wound has a little bit of slough on the surface but the larger main wound is very clean. She has been approved for a wound VAC and we will apply that today. 09/25/2022: Post Wound sloughly- alginate Ag. Vac on lateral wound; decreased undermining. 10/02/2022: There is a little bit of slough on the posterior wound. The lateral leg wound continues to contract at a remarkable rate. It is  clean without any slough or other debris accumulation. Electronic Signature(s) Signed: 10/02/2022 9:43:23 AM By: Duanne Guess MD FACS Entered By: Duanne Guess on 10/02/2022 09:43:23 -------------------------------------------------------------------------------- Physical Exam Details Patient Name: Date of Service: ETTEL, SIPOS 10/02/2022 9:00 A M Medical Record Number: [id number] Patient Account Number: [id number] Date of Birth/Sex: Treating RN: [birth year] (82 y.o. [name] Long Primary Care Provider: PA Zenovia Jordan, [place] Other Clinician: Referring Provider: Treating Provider/Extender: Sherryl Manges in Treatment: 3 [place], Hailyn ([id number]) [id number] Page 3 of 9 Constitutional . . . . no acute distress. Respiratory Normal work of breathing on room air. Notes 10/02/2022: There is a little bit of slough on the posterior wound. The lateral leg wound continues to contract at a remarkable rate. It is clean without any slough or other debris accumulation. Electronic Signature(s) Signed: 10/02/2022 9:43:59 AM By: Duanne Guess MD FACS Entered By: Duanne Guess on 10/02/2022 09:43:58 -------------------------------------------------------------------------------- Physician Orders Details Patient Name: Date of Service: [name], Kelita 10/02/2022 9:00 A M Medical Record Number: [id number] Patient Account Number: [id number] Date of Birth/Sex: Treating RN: 1940-04-17 (82 y.o. [name] Long Primary Care Provider: PA Zenovia Jordan, [place] Other Clinician: Referring Provider: Treating Provider/Extender: Sherryl Manges in Treatment: 3 Verbal / Phone Orders: No Diagnosis Coding ICD-10 Coding Code Description [phone] 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 Z79.01 Long term (current) use of anticoagulants I50.42 Chronic combined systolic (congestive) and  diastolic (congestive) heart failure N18.30 Chronic kidney disease, stage 3 unspecified Follow-up Appointments ppointment in 1 week. - Dr. Lady Gary - RM 1 Return A Monday 8/5 at 8:15 AM Nurse Visit: - RM 1 Thurs 8/1 @ 07:30 am Anesthetic (In clinic) Topical Lidocaine 4% applied to wound bed Bathing/ Shower/ Hygiene May shower with protection but do not get wound dressing(s) wet. Protect dressing(s) with water repellant cover (for example, large plastic bag) or a cast cover and may then take shower. Negative Presssure  Wound Therapy Medela Wound Vac continuously at 1110mm/hg - bridge wound 1 and 2 together Black Foam Edema Control - Lymphedema / SCD / Other Elevate legs to the level of the heart or above for 30 minutes daily and/or when sitting for 3-4 times a day throughout the day. Avoid standing for long periods of time. Exercise regularly Home Health Wound #1 Left,Lateral Lower Leg Admit to Home Health for skilled nursing wound care. May utilize formulary equivalent dressing for wound treatment orders unless otherwise specified. Dressing changes to be completed by Home Health on Monday / Wednesday / Friday except when patient has scheduled visit at [facility]. Wound Treatment Wound #1 - Lower Leg Wound Laterality: Left, Lateral Cleanser: Soap and Water 2 x Per Week/30 Days [place], [name] Long ([id number]) [id number] Page 4 of 9 Discharge Instructions: May shower and wash wound with dial antibacterial soap and water prior to dressing change. Cleanser: Vashe 5.8 (oz) 2 x Per Week/30 Days Discharge Instructions: Cleanse the wound with Vashe prior to applying a clean dressing using gauze sponges, not tissue or cotton balls. Prim Dressing: NPWT ary 2 x Per Week/30 Days Compression Wrap: Kerlix Roll 4.5x3.1 (in/yd) 2 x Per Week/30 Days Discharge Instructions: Apply Kerlix and Coban compression as directed. Compression Wrap: Coban Self-Adherent Wrap 4x5  (in/yd) 2 x Per Week/30 Days Discharge Instructions: Apply over Kerlix as directed. Wound #3 - Lower Leg Wound Laterality: Left, Posterior Cleanser: Soap and Water 2 x Per Week/30 Days Discharge Instructions: May shower and wash wound with dial antibacterial soap and water prior to dressing change. Cleanser: Vashe 5.8 (oz) 2 x Per Week/30 Days Discharge Instructions: Cleanse the wound with Vashe prior to applying a clean dressing using gauze sponges, not tissue or cotton balls. Prim Dressing: NPWT ary 2 x Per Week/30 Days Compression Wrap: Kerlix Roll 4.5x3.1 (in/yd) 2 x Per Week/30 Days Discharge Instructions: Apply Kerlix and Coban compression as directed. Compression Wrap: Coban Self-Adherent Wrap 4x5 (in/yd) 2 x Per Week/30 Days Discharge Instructions: Apply over Kerlix as directed. Electronic Signature(s) Signed: 10/02/2022 10:26:58 AM By: Duanne Guess MD FACS Entered By: Duanne Guess on 10/02/2022 09:44:12 -------------------------------------------------------------------------------- Problem List Details Patient Name: Date of Service: [name], Bradlee 10/02/2022 9:00 A M Medical Record Number: [id number] Patient Account Number: [id number] Date of Birth/Sex: Treating RN: 11-13-1940 (82 y.o. [name] Long Primary Care Provider: PA Zenovia Jordan, [place] Other Clinician: Referring Provider: Treating Provider/Extender: Sherryl Manges in Treatment: 3 Active Problems ICD-10 Encounter Code Description Active Date MDM Diagnosis L97.822 Non-pressure chronic ulcer of other part of left lower leg with fat layer exposed7/05/2022 No Yes E11.622 Type 2 diabetes mellitus with other skin ulcer 09/06/2022 No Yes Z79.01 Long term (current) use of anticoagulants 09/06/2022 No Yes I50.42 Chronic combined systolic (congestive) and diastolic (congestive) heart failure 09/06/2022 No Yes N18.30 Chronic kidney disease, stage 3 unspecified 09/06/2022 No Yes [place], Ramina ([id number])  [id number] Page 5 of 9 Inactive Problems Resolved Problems Electronic Signature(s) Signed: 10/02/2022 9:42:14 AM By: Duanne Guess MD FACS Entered By: Duanne Guess on 10/02/2022 09:42:14 -------------------------------------------------------------------------------- Progress Note Details Patient Name: Date of Service: [name], Shahidah 10/02/2022 9:00 A M Medical Record Number: [id number] Patient Account Number: [id number] Date of Birth/Sex: Treating RN: [birth year] (82 y.o. [name] Long Primary Care Provider: PA Zenovia Jordan, [place] Other Clinician: Referring Provider: Treating Provider/Extender: Sherryl Manges in Treatment: 3 Subjective Chief Complaint Information obtained from Patient Patient seen for complaints of Non-Healing Wound. History of Present Illness (HPI) ADMISSION 09/06/2022 This is an 82 year old  type II diabetic (no A1c to review and patient is unaware of her last value) with a history of atrial fibrillation on chronic anticoagulant therapy, lower extremity edema, congestive heart failure, and stage III kidney disease. She divides her time between her home in [place] and here in [place] where her daughter lives. While in [place], she struck her leg on her bed frame and developed a large hematoma. She was evaluated in the emergency department there and imaging was negative for any deeper injury, such as a fracture. She returned to [facility] and has had an urgent care and emergency department visit to evaluate the site. As there had been no significant improvement in her wound, she was referred to the wound care center for further evaluation and management. I am unable to see any evidence of an attempt to evacuate the hematoma in the electronic medical record.. There are 2 openings in her skin, but they connect to each other with significant tunneling and undermining. I am unsure if these opened spontaneously or there was a  non- documented attempt to drain the hematoma site. The majority of her left lower leg from below the tibial tuberosity to just above the ankle and from the anterior tibia wrapping laterally and posteriorly to nearly the medial aspect of her leg is involved. There is a foul odor coming from her wound. There is no frank pus but there is significant retained clot that has likely served as culture media. ABI in clinic today was 0.84. 09/08/2022: The patient came in today as an add-on due to bloody drainage that was saturating her dressing. On inspection, it appeared that there was no frank fresh blood and no active site of bleeding identified; it appears that more of the hematoma was liquefied and draining from her wound. 09/13/2022: The wound has cleaned up considerably. There is no longer any foul odor and the drainage has decreased significantly. The patient is also complaining of less pain. There is still some nonviable subcutaneous tissue and fat present but overall everything is improved from the last visit. Her culture grew out a polymicrobial population of multiple drug-resistant organisms. She is currently taking Augmentin and levofloxacin as suggested by the culture data. 09/20/2022: The cavity is contracting nicely. There is no longer a connection between the posterior wound and the lateral leg wound. The posterior wound has a little bit of slough on the surface but the larger main wound is very clean. She has been approved for a wound VAC and we will apply that today. 09/25/2022: Post Wound sloughly- alginate Ag. Vac on lateral wound; decreased undermining. 10/02/2022: There is a little bit of slough on the posterior wound. The lateral leg wound continues to contract at a remarkable rate. It is clean without any slough or other debris accumulation. Patient History Information obtained from Patient. Family History No family history of Hereditary Spherocytosis, Hypertension, Kidney Disease, Lung  Disease, Seizures, Stroke, Thyroid Problems, Tuberculosis. Social History Never smoker, Marital Status - Widowed, Alcohol Use - Never, Drug Use - No History, Caffeine Use - Never. Medical History Cardiovascular Patient has history of Arrhythmia - A fib, Congestive Heart Failure, Hypertension Endocrine Patient has history of Type II Diabetes - since 2019 Medical A Surgical History Notes nd Cardiovascular High Cholesterol Genitourinary Kidney Disease RAINA, LOEFFLER ([id number]) [id number] Page 6 of 9 Objective Constitutional no acute distress. Vitals Time Taken: 9:10 AM, Height: 64 in, Weight: 162 lbs, BMI: 27.8, Temperature: 98.3 F, Pulse: 75 bpm, Respiratory Rate: 18 breaths/min, Blood  Pressure: 138/66 mmHg, Capillary Blood Glucose: 138 mg/dl. General Notes: glucose per pt report this am Respiratory Normal work of breathing on room air. General Notes: 10/02/2022: There is a little bit of slough on the posterior wound. The lateral leg wound continues to contract at a remarkable rate. It is clean without any slough or other debris accumulation. Integumentary (Hair, Skin) Wound #1 status is Open. Original cause of wound was Hematoma. The date acquired was: 08/05/2022. The wound has been in treatment 3 weeks. The wound is located on the Left,Lateral Lower Leg. The wound measures 3.1cm length x 2.1cm width x 1.2cm depth; 5.113cm^2 area and 6.136cm^3 volume. There is Fat Layer (Subcutaneous Tissue) exposed. There is no tunneling noted, however, there is undermining starting at 9:00 and ending at 12:00 with a maximum distance of 1.5cm. There is a medium amount of serosanguineous drainage noted. The wound margin is epibole. There is large (67-100%) red, friable granulation within the wound bed. There is no necrotic tissue within the wound bed. The periwound skin appearance had no abnormalities noted for texture. The periwound skin appearance had no abnormalities noted  for moisture. The periwound skin appearance exhibited: Hemosiderin Staining. Periwound temperature was noted as No Abnormality. Wound #3 status is Open. Original cause of wound was Trauma. The date acquired was: 09/20/2022. The wound has been in treatment 1 weeks. The wound is located on the Left,Posterior Lower Leg. The wound measures 1.8cm length x 3cm width x 0.3cm depth; 4.241cm^2 area and 1.272cm^3 volume. There is Fat Layer (Subcutaneous Tissue) exposed. There is no tunneling or undermining noted. There is a medium amount of serosanguineous drainage noted. The wound margin is flat and intact. There is large (67-100%) red granulation within the wound bed. There is a small (1-33%) amount of necrotic tissue within the wound bed including Adherent Slough. The periwound skin appearance had no abnormalities noted for texture. The periwound skin appearance had no abnormalities noted for moisture. The periwound skin appearance exhibited: Hemosiderin Staining. Periwound temperature was noted as No Abnormality. Assessment Active Problems ICD-10 Non-pressure chronic ulcer of other part of left lower leg with fat layer exposed Type 2 diabetes mellitus with other skin ulcer Long term (current) use of anticoagulants Chronic combined systolic (congestive) and diastolic (congestive) heart failure Chronic kidney disease, stage 3 unspecified Procedures Wound #3 Pre-procedure diagnosis of Wound #3 is an Infection - not elsewhere classified located on the Left,Posterior Lower Leg . There was a Selective/Open Wound Non-Viable Tissue Debridement with a total area of 4.24 sq cm performed by Duanne Guess, MD. With the following instrument(s): Curette to remove Non- Viable tissue/material. Material removed includes [facility] after achieving pain control using Lidocaine 4% Topical Solution. No specimens were taken. A time out was conducted at 09:35, prior to the start of the procedure. A Minimum amount of bleeding  was controlled with Pressure. The procedure was tolerated well with a pain level of 0 throughout and a pain level of 0 following the procedure. Post Debridement Measurements: 1.8cm length x 3cm width x 0.3cm depth; 1.272cm^3 volume. Character of Wound/Ulcer Post Debridement is improved. Post procedure Diagnosis Wound #3: Same as Pre-Procedure General Notes: Scribed for Dr Lady Gary by [name], RN. Plan Follow-up Appointments: Return Appointment in 1 week. - Dr. Lady Gary - RM 1 Monday 8/5 at 8:15 AM Nurse Visit: - RM 1 Thurs 8/1 @ 07:30 am Anesthetic: (In clinic) Topical Lidocaine 4% applied to wound bed [place], [name] Long ([id number]) [id number] Page 7 of 9 Bathing/ Shower/ Hygiene: May shower  with protection but do not get wound dressing(s) wet. Protect dressing(s) with water repellant cover (for example, large plastic bag) or a cast cover and may then take shower. Negative Presssure Wound Therapy: Medela Wound Vac continuously at 156mm/hg - bridge wound 1 and 2 together Black Foam Edema Control - Lymphedema / SCD / Other: Elevate legs to the level of the heart or above for 30 minutes daily and/or when sitting for 3-4 times a day throughout the day. Avoid standing for long periods of time. Exercise regularly Home Health: Wound #1 Left,Lateral Lower Leg: Admit to Home Health for skilled nursing wound care. May utilize formulary equivalent dressing for wound treatment orders unless otherwise specified. Dressing changes to be completed by Home Health on Monday / Wednesday / Friday except when patient has scheduled visit at [facility]. WOUND #1: - Lower Leg Wound Laterality: Left, Lateral Cleanser: Soap and Water 2 x Per Week/30 Days Discharge Instructions: May shower and wash wound with dial antibacterial soap and water prior to dressing change. Cleanser: Vashe 5.8 (oz) 2 x Per Week/30 Days Discharge Instructions: Cleanse the wound with Vashe prior to  applying a clean dressing using gauze sponges, not tissue or cotton balls. Prim Dressing: NPWT 2 x Per Week/30 Days ary Com pression Wrap: Kerlix Roll 4.5x3.1 (in/yd) 2 x Per Week/30 Days Discharge Instructions: Apply Kerlix and Coban compression as directed. Com pression Wrap: Coban Self-Adherent Wrap 4x5 (in/yd) 2 x Per Week/30 Days Discharge Instructions: Apply over Kerlix as directed. WOUND #3: - Lower Leg Wound Laterality: Left, Posterior Cleanser: Soap and Water 2 x Per Week/30 Days Discharge Instructions: May shower and wash wound with dial antibacterial soap and water prior to dressing change. Cleanser: Vashe 5.8 (oz) 2 x Per Week/30 Days Discharge Instructions: Cleanse the wound with Vashe prior to applying a clean dressing using gauze sponges, not tissue or cotton balls. Prim Dressing: NPWT 2 x Per Week/30 Days ary Com pression Wrap: Kerlix Roll 4.5x3.1 (in/yd) 2 x Per Week/30 Days Discharge Instructions: Apply Kerlix and Coban compression as directed. Com pression Wrap: Coban Self-Adherent Wrap 4x5 (in/yd) 2 x Per Week/30 Days Discharge Instructions: Apply over Kerlix as directed. 10/02/2022: There is a little bit of slough on the posterior wound. The lateral leg wound continues to contract at a remarkable rate. It is clean without any slough or other debris accumulation. I used a curette to debride the slough posterior wound. We are going to put a piece of sponge here and bridge the wounds. Continue negative pressure wound therapy. Nurse visit later this week, follow-up with me a week from today. Electronic Signature(s) Signed: 10/02/2022 9:45:01 AM By: Duanne Guess MD FACS Entered By: Duanne Guess on 10/02/2022 09:45:01 -------------------------------------------------------------------------------- HxROS Details Patient Name: Date of Service: [name], Tarrie 10/02/2022 9:00 A M Medical Record Number: [id number] Patient Account Number: [id number] Date of Birth/Sex:  Treating RN: Nov 03, 1940 (82 y.o. [name] Long Primary Care Provider: PA Zenovia Jordan, [place] Other Clinician: Referring Provider: Treating Provider/Extender: Sherryl Manges in Treatment: 3 Information Obtained From Patient Cardiovascular Medical History: Positive for: Arrhythmia - A fib; Congestive Heart Failure; Hypertension Past Medical History Notes: High Cholesterol Endocrine Medical History: Positive for: Type II Diabetes - since 2019 Hasty, Jacqeline ([id number]) [id number] Page 8 of 9 Genitourinary Medical History: Past Medical History Notes: Kidney Disease Immunizations Pneumococcal Vaccine: Received Pneumococcal Vaccination: No Implantable Devices No devices added Family and Social History Hereditary Spherocytosis: No; Hypertension: No; Kidney Disease: No; Lung Disease: No; Seizures: No; Stroke: No;  Thyroid Problems: No; Tuberculosis: No; Never smoker; Marital Status - Widowed; Alcohol Use: Never; Drug Use: No History; Caffeine Use: Never; Financial Concerns: No; Food, Clothing or Shelter Needs: No; Support System Lacking: No; Transportation Concerns: No Electronic Signature(s) Signed: 10/02/2022 10:26:58 AM By: Duanne Guess MD FACS Signed: 10/02/2022 5:48:36 PM By: [name] RN, BSN Entered By: Duanne Guess on 10/02/2022 09:43:29 -------------------------------------------------------------------------------- SuperBill Details Patient Name: Date of Service: JOYAH, DONHAM 10/02/2022 Medical Record Number: [id number] Patient Account Number: [id number] Date of Birth/Sex: Treating RN: 31-Mar-1940 (82 y.o. 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 Z79.01 Long term (current) use of anticoagulants I50.42 Chronic combined systolic (congestive) and diastolic (congestive) heart failure N18.30 Chronic kidney disease, stage 3 unspecified Facility Procedures : CPT4 Code: 08657846 Description: 97597 - DEBRIDE WOUND 1ST 20 SQ CM OR < ICD-10 Diagnosis Description L97.822 Non-pressure chronic ulcer of other part of left lower leg with fat layer expose Modifier: d Quantity: 1 : CPT4 Code: [id number] Description: 13244 - WOUND VAC-50 SQ CM OR LESS Modifier: 59 Quantity: 1 Physician Procedures : CPT4 Code Description Modifier [id number] 99214 - WC PHYS LEVEL 4 - EST PT 25 ICD-10 Diagnosis Description L97.822 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  Z79.01 Long term (current) use of anticoagulants I50.42 Chronic combined systolic (congestive) and diastolic (congestive) heart failure Quantity: 1 : [id number] 97597 - WC PHYS DEBR WO ANESTH 20 SQ CM ICD-10 Diagnosis Description TERRENCE, ABDULKARIM ([id number]) [id number] L97.822 Non-pressure chronic ulcer of other part of left lower leg with fat layer exposed Quantity: 1 _87564.pdf Page 9 of 9 Electronic Signature(s) Signed: 10/02/2022 10:26:58 AM By: Duanne Guess MD FACS Signed: 10/02/2022 5:48:36 PM By: [name] RN, BSN Previous Signature: 10/02/2022 9:45:29 AM Version By: Duanne Guess MD FACS Entered By: [name] on 10/02/2022 10:00:52

## 2022-10-03 NOTE — Progress Notes (Signed)
[place], [place] ([id number]) [id number] Page 1 of 9 Visit Report for 10/02/2022 Arrival Information Details Patient Name: Date of Service: [name] Long, [name] Long 10/02/2022 9:00 A M Medical Record Number: [id number] Patient Account Number: [id number] Date of Birth/Sex: Treating RN: [birth year] (82 y.o. [name], [name] Primary Care Tauheed Mcfayden: PA Zenovia Jordan, NO Other Clinician: Referring Brittiany Wiehe: Treating Glorene Leitzke/Extender: Sherryl Manges in Treatment: 3 Visit Information History Since Last Visit Added or deleted any medications: No Patient Arrived: Walker Any new allergies or adverse reactions: No Arrival Time: 09:05 Had a fall or experienced change in No Accompanied By: daughter activities of daily living that may affect Transfer Assistance: None risk of falls: Patient Identification Verified: Yes Signs or symptoms of abuse/neglect since last visito No Secondary Verification Process Completed: Yes Hospitalized since last visit: No Patient Requires Transmission-Based Precautions: No Implantable device outside of the clinic excluding No Patient Has Alerts: No cellular tissue based products placed in the center since last visit: Has Dressing in Place as Prescribed: Yes Has Compression in Place as Prescribed: Yes Pain Present Now: No Electronic Signature(s) Signed: 10/02/2022 5:48:36 PM By: [name] RN, BSN Entered By: [name] on 10/02/2022 09:10:43 -------------------------------------------------------------------------------- Encounter Discharge Information Details Patient Name: Date of Service: [name] Long, [name] Long 10/02/2022 9:00 A M Medical Record Number: [id number] Patient Account Number: [id number] Date of Birth/Sex: Treating RN: 30-Sep-1940 (82 y.o. [name] Andretta Ergle: PA Zenovia Jordan, [place] Other Clinician: Referring Vere Diantonio: Treating Tameria Patti/Extender: Sherryl Manges in Treatment: 3 Encounter Discharge Information Items Post  Procedure Vitals Discharge Condition: Stable Temperature (F): 98.3 Ambulatory Status: Walker Pulse (bpm): 75 Discharge Destination: Home Respiratory Rate (breaths/min): 18 Transportation: Private Auto Blood Pressure (mmHg): 138/66 Accompanied By: daughter Schedule Follow-up Appointment: Yes Clinical Summary of Care: Patient Declined Electronic Signature(s) Signed: 10/02/2022 5:48:36 PM By: [name] RN, BSN Entered By: [name] on 10/02/2022 10:01:53 [place], [name] ([id number]) [id number] Page 2 of 9 -------------------------------------------------------------------------------- Lower Extremity Assessment Details Patient Name: Date of Service: [name] Long, [name] Long 10/02/2022 9:00 A M Medical Record Number: [id number] Patient Account Number: [id number] Date of Birth/Sex: Treating RN: 09/11/1940 (82 y.o. [name] Kayleann Mccaffery: PA Zenovia Jordan, [place] Other Clinician: Referring Ayliana Casciano: Treating Kenlee Maler/Extender: Sherryl Manges in Treatment: 3 Edema Assessment Assessed: [Left: No] [Right: No] Edema: [Left: Ye] [Right: s] Calf Left: Right: Point of Measurement: From Medial Instep 34.5 cm Ankle Left: Right: Point of Measurement: From Medial Instep 25.5 cm Vascular Assessment Pulses: Dorsalis Pedis Palpable: [Left:Yes] Extremity colors, hair growth, and conditions: Extremity Color: [Left:Hyperpigmented] Hair Growth on Extremity: [Left:No] Temperature of Extremity: [Left:Warm < 3 seconds] Electronic Signature(s) Signed: 10/02/2022 5:48:36 PM By: [name] RN, BSN Entered By: [name] on 10/02/2022 09:18:05 -------------------------------------------------------------------------------- Multi Wound Chart Details Patient Name: Date of Service: [name] Long, [name] Long 10/02/2022 9:00 A M Medical Record Number: [id number] Patient Account Number: [id number] Date of Birth/Sex: Treating RN: 1940-09-16 (82 y.o. [name] Richards Pherigo: PA Zenovia Jordan, [place] Other Clinician: Referring Eleonor Ocon: Treating Marcy Bogosian/Extender: Sherryl Manges in Treatment: 3 Vital Signs Height(in): 64 Capillary Blood Glucose(mg/dl): 536 Weight(lbs): 644 Pulse(bpm): 75 Body Mass Index(BMI): 27.8 Blood Pressure(mmHg): 138/66 Temperature(F): 98.3 Respiratory Rate(breaths/min): 18 [1:Photos:] [N/A:N/A [id number] Page 3 of 9] Left, Lateral Lower Leg Left, Posterior Lower Leg N/A Wound Location: Hematoma Trauma N/A Wounding Event: Diabetic Wound/Ulcer of the Lower Infection - not elsewhere classified N/A Primary Etiology: Extremity Arrhythmia, Congestive Heart Failure, Arrhythmia, Congestive Heart Failure, N/A Comorbid History: Hypertension, Type II Diabetes Hypertension, Type II Diabetes 08/05/2022 09/20/2022 N/A Date  Acquired: 3 1 N/A Weeks of Treatment: Open Open N/A Wound Status: No No N/A Wound Recurrence: Yes No N/A Clustered Wound: 2 N/A N/A Clustered Quantity: 3.1x2.1x1.2 1.8x3x0.3 N/A Measurements L x W x D (cm) 5.113 4.241 N/A A (cm) : rea 6.136 1.272 N/A Volume (cm) : 85.50% 2.20% N/A % Reduction in A rea: 80.70% -193.10% N/A % Reduction in Volume: 9 Starting Position 1 (o'clock): 12 Ending Position 1 (o'clock): 1.5 Maximum Distance 1 (cm): Yes No N/A Undermining: Grade 2 Full Thickness Without Exposed N/A Classification: Support Structures Medium Medium N/A Exudate A mount: Serosanguineous Serosanguineous N/A Exudate Type: red, brown red, brown N/A Exudate Color: Epibole Flat and Intact N/A Wound Margin: Large (67-100%) Large (67-100%) N/A Granulation A mount: Red, Friable Red N/A Granulation Quality: None Present (0%) Small (1-33%) N/A Necrotic A mount: Fat Layer (Subcutaneous Tissue): Yes Fat Layer (Subcutaneous Tissue): Yes N/A Exposed Structures: Fascia: No Fascia: No Tendon: No Tendon: No Muscle: No Muscle: No Joint: No Joint:  No Bone: No Bone: No None None N/A Epithelialization: N/A Debridement - Selective/Open Wound N/A Debridement: Pre-procedure Verification/Time Out N/A 09:35 N/A Taken: N/A Lidocaine 4% Topical Solution N/A Pain Control: N/A Slough N/A Tissue Debrided: N/A Non-Viable Tissue N/A Level: N/A 4.24 N/A Debridement A (sq cm): rea N/A Curette N/A Instrument: N/A Minimum N/A Bleeding: N/A Pressure N/A Hemostasis A chieved: N/A 0 N/A Procedural Pain: N/A 0 N/A Post Procedural Pain: N/A Procedure was tolerated well N/A Debridement Treatment Response: N/A 1.8x3x0.3 N/A Post Debridement Measurements L x W x D (cm) N/A 1.272 N/A Post Debridement Volume: (cm) Rash: No No Abnormalities Noted N/A Periwound Skin Texture: Maceration: No No Abnormalities Noted N/A Periwound Skin Moisture: Dry/Scaly: No Hemosiderin Staining: Yes Hemosiderin Staining: Yes N/A Periwound Skin Color: No Abnormality No Abnormality N/A Temperature: Negative Pressure Wound Therapy Debridement N/A Procedures Performed: Maintenance (NPWT) Treatment Notes Electronic Signature(s) Signed: 10/02/2022 9:42:24 AM By: Duanne Guess MD FACS Signed: 10/02/2022 5:48:36 PM By: [name] RN, BSN Entered By: Duanne Guess on 10/02/2022 09:42:24 Antony Odea ([id number]) [id number] Page 4 of 9 -------------------------------------------------------------------------------- Multi-Disciplinary Care Plan Details Patient Name: Date of Service: [name] Long, [name] Long 10/02/2022 9:00 A M Medical Record Number: [id number] Patient Account Number: [id number] Date of Birth/Sex: Treating RN: 01-01-1941 (82 y.o. [name] Argusta Mcgann: PA Zenovia Jordan, [place] Other Clinician: Referring Nicoletta Hush: Treating Kodie Kishi/Extender: Sherryl Manges in Treatment: 3 Multidisciplinary Care Plan reviewed with physician Active Inactive Wound/Skin Impairment Nursing Diagnoses: Impaired tissue  integrity Goals: Patient/caregiver will verbalize understanding of skin care regimen Date Initiated: 09/06/2022 Target Resolution Date: 11/04/2022 Goal Status: Active Interventions: Assess patient/caregiver ability to obtain necessary supplies Assess patient/caregiver ability to perform ulcer/skin care regimen upon admission and as needed Assess ulceration(s) every visit Provide education on ulcer and skin care Screen for HBO Treatment Activities: Skin care regimen initiated : 09/06/2022 Topical wound management initiated : 09/06/2022 Notes: Electronic Signature(s) Signed: 10/02/2022 5:48:36 PM By: [name] RN, BSN Entered By: [name] on 10/02/2022 09:32:27 -------------------------------------------------------------------------------- Negative Pressure Wound Therapy Maintenance (NPWT) Details Patient Name: Date of Service: [name] Long, [name] Long 10/02/2022 9:00 A M Medical Record Number: [id number] Patient Account Number: [id number] Date of Birth/Sex: Treating RN: 1940/04/11 (82 y.o. [name] Calyx Hawker: PA Zenovia Jordan, [place] Other Clinician: Referring Coolidge Gossard: Treating Anneliese Leblond/Extender: Sherryl Manges in Treatment: 3 NPWT Maintenance Performed for: Wound #1 Left, Lateral Lower Leg Additional Injuries Covered: Yes Additional Injuries: Wound #3 Left, Posterior Lower Leg Performed By: [name], RN Type: Medela Liberty  Coverage Size (sq cm): 11.91 Pressure Type: Constant Pressure Setting: 125 mmHG Drain Type: None Primary Contact: None Sponge/Dressing Type: Foam- Black Date Initiated: 09/20/2022 Dressing Removed: Yes Quantity of Sponges/Gauze Removed: 1 Canister Changed: No Dressing Reapplied: Yes Quantity of Sponges/Gauze Inserted: 2 Respones T Treatmentfelecia nard, Mattea ([id number]) [id number] Page 5 of 9 Days On NPWT: 13 Post Procedure Diagnosis Same as Pre-procedure Electronic Signature(s) Signed: 10/02/2022  5:48:36 PM By: [name] RN, BSN Entered By: [name] on 10/02/2022 09:39:57 -------------------------------------------------------------------------------- Pain Assessment Details Patient Name: Date of Service: [name] Long, [name] Long 10/02/2022 9:00 A M Medical Record Number: [id number] Patient Account Number: [id number] Date of Birth/Sex: Treating RN: [birth year] (82 y.o. [name] Markea Ruzich: PA Zenovia Jordan, [place] Other Clinician: Referring Santi Troung: Treating Pinchos Topel/Extender: Sherryl Manges in Treatment: 3 Active Problems Location of Pain Severity and Description of Pain Patient Has Paino No Site Locations Rate the pain. Current Pain Level: 0 Pain Management and Medication Current Pain Management: Electronic Signature(s) Signed: 10/02/2022 5:48:36 PM By: [name] RN, BSN Entered By: [name] on 10/02/2022 09:17:42 -------------------------------------------------------------------------------- Patient/Caregiver Education Details Patient Name: Date of Service: [name] Long, [name] Long 7/29/2024andnbsp9:00 A M Medical Record Number: [id number] Patient Account Number: [id number] Date of Birth/Gender: Treating RN: 02-25-1941 (82 y.o. [name] Physician: PA Zenovia Jordan, [place] Other Clinician: Referring Physician: Treating Physician/Extender: Sherryl Manges in Treatment: 3 [place], Kimberlee ([id number]) [id number] Page 6 of 9 Education Assessment Education Provided To: Patient Education Topics Provided Venous: Methods: Explain/Verbal Responses: Reinforcements needed, State content correctly Wound/Skin Impairment: Methods: Explain/Verbal Responses: Reinforcements needed, State content correctly Electronic Signature(s) Signed: 10/02/2022 5:48:36 PM By: [name] RN, BSN Entered By: [name] on 10/02/2022  09:32:48 -------------------------------------------------------------------------------- Wound Assessment Details Patient Name: Date of Service: [name] Long, [name] Long 10/02/2022 9:00 A M Medical Record Number: [id number] Patient Account Number: [id number] Date of Birth/Sex: Treating RN: Oct 08, 1940 (82 y.o. [name] Piedad Standiford: PA Zenovia Jordan, [place] Other Clinician: Referring Marne Meline: Treating Jihan Rudy/Extender: Sherryl Manges in Treatment: 3 Wound Status Wound Number: 1 Primary Diabetic Wound/Ulcer of the Lower Extremity Etiology: Wound Location: Left, Lateral Lower Leg Wound Status: Open Wounding Event: Hematoma Comorbid Arrhythmia, Congestive Heart Failure, Hypertension, Type II Date Acquired: 08/05/2022 History: Diabetes Weeks Of Treatment: 3 Clustered Wound: Yes Photos Wound Measurements Length: (cm) 3.1 Width: (cm) 2.1 Depth: (cm) 1.2 Clustered Quantity: 2 Area: (cm) 5.113 Volume: (cm) 6.136 % Reduction in Area: 85.5% % Reduction in Volume: 80.7% Epithelialization: None Tunneling: No Undermining: Yes Starting Position (o'clock): 9 Ending Position (o'clock): 12 Maximum Distance: (cm) 1.5 Wound Description Classification: Grade 2 Wound Margin: Rudi Grandberry, Chrishauna ([id number]) Exudate Amount: Medium Exudate Type: Serosanguineous Exudate Color: red, brown Foul Odor After Cleansing: No Slough/Fibrino Yes [id number] Page 7 of 9 Wound Bed Granulation Amount: Large (67-100%) Exposed Structure Granulation Quality: Red, Friable Fascia Exposed: No Necrotic Amount: None Present (0%) Fat Layer (Subcutaneous Tissue) Exposed: Yes Tendon Exposed: No Muscle Exposed: No Joint Exposed: No Bone Exposed: No Periwound Skin Texture Texture Color No Abnormalities Noted: Yes No Abnormalities Noted: No Hemosiderin Staining: Yes Moisture No Abnormalities Noted: Yes Temperature / Pain Temperature: No Abnormality Treatment Notes Wound #1  (Lower Leg) Wound Laterality: Left, Lateral Cleanser Soap and Water Discharge Instruction: May shower and wash wound with dial antibacterial soap and water prior to dressing change. Vashe 5.8 (oz) Discharge Instruction: Cleanse the wound with Vashe prior to applying a clean dressing using gauze sponges, not tissue or cotton balls. Peri-Wound Care Topical Primary Dressing  NPWT Secondary Dressing Secured With Compression Wrap Kerlix Roll 4.5x3.1 (in/yd) Discharge Instruction: Apply Kerlix and Coban compression as directed. Coban Self-Adherent Wrap 4x5 (in/yd) Discharge Instruction: Apply over Kerlix as directed. Compression Stockings Add-Ons Electronic Signature(s) Signed: 10/02/2022 5:48:36 PM By: [name] RN, BSN Entered By: [name] on 10/02/2022 09:28:15 -------------------------------------------------------------------------------- Wound Assessment Details Patient Name: Date of Service: [name] Long, [name] Long 10/02/2022 9:00 A M Medical Record Number: [id number] Patient Account Number: [id number] Date of Birth/Sex: Treating RN: 17-Dec-1940 (82 y.o. [name] Eshani Maestre: PA Zenovia Jordan, [place] Other Clinician: Referring Hallis Meditz: Treating Eliberto Sole/Extender: Sherryl Manges in Treatment: 3 Wound Status Wound Number: 3 Primary Infection - not elsewhere classified Etiology: Wound Location: Left, Posterior Lower Leg [place], Saavi ([id number]) [id number] Page 8 of 9 Wound Status: Open Wounding Event: Trauma Comorbid Arrhythmia, Congestive Heart Failure, Hypertension, Type II Date Acquired: 09/20/2022 History: Diabetes Weeks Of Treatment: 1 Clustered Wound: No Photos Wound Measurements Length: (cm) 1.8 Width: (cm) 3 Depth: (cm) 0.3 Area: (cm) 4.241 Volume: (cm) 1.272 % Reduction in Area: 2.2% % Reduction in Volume: -193.1% Epithelialization: None Tunneling: No Undermining: No Wound Description Classification: Full  Thickness Without Exposed Support Structures Wound Margin: Flat and Intact Exudate Amount: Medium Exudate Type: Serosanguineous Exudate Color: red, brown Foul Odor After Cleansing: No Slough/Fibrino No Wound Bed Granulation Amount: Large (67-100%) Exposed Structure Granulation Quality: Red Fascia Exposed: No Necrotic Amount: Small (1-33%) Fat Layer (Subcutaneous Tissue) Exposed: Yes Necrotic Quality: Adherent Slough Tendon Exposed: No Muscle Exposed: No Joint Exposed: No Bone Exposed: No Periwound Skin Texture Texture Color No Abnormalities Noted: Yes No Abnormalities Noted: No Hemosiderin Staining: Yes Moisture No Abnormalities Noted: Yes Temperature / Pain Temperature: No Abnormality Treatment Notes Wound #3 (Lower Leg) Wound Laterality: Left, Posterior Cleanser Soap and Water Discharge Instruction: May shower and wash wound with dial antibacterial soap and water prior to dressing change. Vashe 5.8 (oz) Discharge Instruction: Cleanse the wound with Vashe prior to applying a clean dressing using gauze sponges, not tissue or cotton balls. Peri-Wound Care Topical Primary Dressing NPWT Secondary Dressing Secured With Compression Wrap Kerlix Roll 4.5x3.1 (in/yd) Discharge Instruction: Apply Kerlix and Coban compression as directed. [address] 4x5 (in/yd) [place], [place] ([id number]) [id number] Page 9 of 9 Discharge Instruction: Apply over Kerlix as directed. Compression Stockings Add-Ons Electronic Signature(s) Signed: 10/02/2022 5:48:36 PM By: [name] RN, BSN Entered By: [name] on 10/02/2022 09:28:50 -------------------------------------------------------------------------------- Vitals Details Patient Name: Date of Service: [name] Long, [name] Long 10/02/2022 9:00 A M Medical Record Number: [id number] Patient Account Number: [id number] Date of Birth/Sex: Treating RN: [birth year] (82 y.o. [name]  Miho Monda: PA Zenovia Jordan, [place] Other Clinician: Referring Fed Ceci: Treating Corban Kistler/Extender: Sherryl Manges in Treatment: 3 Vital Signs Time Taken: 09:10 Temperature (F): 98.3 Height (in): 64 Pulse (bpm): 75 Weight (lbs): 162 Respiratory Rate (breaths/min): 18 Body Mass Index (BMI): 27.8 Blood Pressure (mmHg): 138/66 Capillary Blood Glucose (mg/dl): 347 Reference Range: 80 - 120 mg / dl Notes glucose per pt report this am Electronic Signature(s) Signed: 10/02/2022 5:48:36 PM By: [name] RN, BSN Entered By: [name] on 10/02/2022 09:11:30

## 2022-10-05 ENCOUNTER — Encounter (HOSPITAL_BASED_OUTPATIENT_CLINIC_OR_DEPARTMENT_OTHER): Payer: Medicare Other | Attending: General Surgery | Admitting: General Surgery

## 2022-10-05 DIAGNOSIS — L97822 Non-pressure chronic ulcer of other part of left lower leg with fat layer exposed: Secondary | ICD-10-CM | POA: Diagnosis not present

## 2022-10-05 DIAGNOSIS — N183 Chronic kidney disease, stage 3 unspecified: Secondary | ICD-10-CM | POA: Insufficient documentation

## 2022-10-05 DIAGNOSIS — Z7901 Long term (current) use of anticoagulants: Secondary | ICD-10-CM | POA: Insufficient documentation

## 2022-10-05 DIAGNOSIS — I130 Hypertensive heart and chronic kidney disease with heart failure and stage 1 through stage 4 chronic kidney disease, or unspecified chronic kidney disease: Secondary | ICD-10-CM | POA: Insufficient documentation

## 2022-10-05 DIAGNOSIS — E7800 Pure hypercholesterolemia, unspecified: Secondary | ICD-10-CM | POA: Diagnosis not present

## 2022-10-05 DIAGNOSIS — E1122 Type 2 diabetes mellitus with diabetic chronic kidney disease: Secondary | ICD-10-CM | POA: Diagnosis not present

## 2022-10-05 DIAGNOSIS — E11622 Type 2 diabetes mellitus with other skin ulcer: Secondary | ICD-10-CM | POA: Insufficient documentation

## 2022-10-05 DIAGNOSIS — I5042 Chronic combined systolic (congestive) and diastolic (congestive) heart failure: Secondary | ICD-10-CM | POA: Diagnosis not present

## 2022-10-05 NOTE — Progress Notes (Signed)
[place], [place] ([id number]) [id number] Page 1 of 5 Visit Report for 10/05/2022 Arrival Information Details Patient Name: Date of Service: [name] Long, [name] Long 10/05/2022 7:30 A M Medical Record Number: [id number] Patient Account Number: [id number] Date of Birth/Sex: Treating RN: 10-25-1940 (82 y.o. [name], [name] Primary Care Jovanna Hodges: PA Zenovia Jordan, [place] Other Clinician: Referring Lacinda Curvin: Treating Dakia Schifano/Extender: Sherryl Manges in Treatment: 4 Visit Information History Since Last Visit Added or deleted any medications: No Patient Arrived: Walker Any new allergies or adverse reactions: No Arrival Time: 08:02 Had a fall or experienced change in No Accompanied By: self activities of daily living that may affect Transfer Assistance: None risk of falls: Patient Identification Verified: Yes Signs or symptoms of abuse/neglect since last visito No Secondary Verification Process Completed: Yes Hospitalized since last visit: No Patient Requires Transmission-Based Precautions: No Implantable device outside of the clinic excluding No Patient Has Alerts: No cellular tissue based products placed in the center since last visit: Has Dressing in Place as Prescribed: Yes Has Compression in Place as Prescribed: Yes Pain Present Now: No Electronic Signature(s) Signed: 10/05/2022 4:06:41 PM By: [name] RN, BSN Entered By: [name] on 10/05/2022 08:03:20 -------------------------------------------------------------------------------- Encounter Discharge Information Details Patient Name: Date of Service: [name] Long, [name] Long 10/05/2022 7:30 A M Medical Record Number: [id number] Patient Account Number: [id number] Date of Birth/Sex: Treating RN: [birth year] (82 y.o. [name] Long Primary Care Treyven Lafauci: PA Zenovia Jordan, [place] Other Clinician: Referring Jennilyn Esteve: Treating Mirabelle Cyphers/Extender: Sherryl Manges in Treatment: 4 Encounter Discharge Information Items Discharge  Condition: Stable Ambulatory Status: Walker Discharge Destination: Home Transportation: Private Auto Accompanied By: daughter Schedule Follow-up Appointment: Yes Clinical Summary of Care: Patient Declined Electronic Signature(s) Signed: 10/05/2022 4:06:41 PM By: [name] RN, BSN Entered By: [name] on 10/05/2022 08:06:07 Antony Odea ([id number]) [id number] Page 2 of 5 -------------------------------------------------------------------------------- Negative Pressure Wound Therapy Maintenance (NPWT) Details Patient Name: Date of Service: Long, [name] 10/05/2022 7:30 A M Medical Record Number: [id number] Patient Account Number: [id number] Date of Birth/Sex: Treating RN: Jul 23, 1940 (82 y.o. [name] Long Primary Care Jarika Robben: PA Zenovia Jordan, [place] Other Clinician: Referring Buryl Bamber: Treating Hollis Oh/Extender: Sherryl Manges in Treatment: 4 NPWT Maintenance Performed for: Wound #1 Left, Lateral Lower Leg Additional Injuries Covered: Yes Additional Injuries: Wound #3 Left, Posterior Lower Leg Performed By: [name], RN Type: Medela [employer] Coverage Size (sq cm): 11.91 Pressure Type: Constant Pressure Setting: 125 mmHG Drain Type: None Primary Contact: None Sponge/Dressing Type: Foam- Black Date Initiated: 09/20/2022 Dressing Removed: Yes Quantity of Sponges/Gauze Removed: 2 Canister Changed: No Canister Exudate Volume: 150 Dressing Reapplied: Yes Quantity of Sponges/Gauze Inserted: 2 Respones T Treatment: o good Days On NPWT : 16 Electronic Signature(s) Signed: 10/05/2022 4:06:41 PM By: [name] RN, BSN Entered By: [name] on 10/05/2022 08:05:29 -------------------------------------------------------------------------------- Patient/Caregiver Education Details Patient Name: Date of Service: [name] Long, [name] 8/1/2024andnbsp7:30 A M Medical Record Number: [id number] Patient Account Number: [id number] Date of  Birth/Gender: Treating RN: [birth year] (82 y.o. [name] Long Primary Care Physician: PA Zenovia Jordan, [place] Other Clinician: Referring Physician: Treating Physician/Extender: Sherryl Manges in Treatment: 4 Education Assessment Education Provided To: Patient Education Topics Provided Venous: Methods: Explain/Verbal Responses: Reinforcements needed, State content correctly Electronic Signature(s) Signed: 10/05/2022 4:06:41 PM By: [name] RN, BSN Entered By: [name] on 10/05/2022 08:05:52 [name], [name] ([id number]) [id number] Page 3 of 5 -------------------------------------------------------------------------------- Wound Assessment Details Patient Name: Date of Service: [name] Long, [name] Long 10/05/2022 7:30 A M Medical Record Number: [id number] Patient Account Number: [id number] Date of Birth/Sex: Treating RN: [birth year] (  82 y.o. [name] Long Primary Care Camira Geidel: PA Zenovia Jordan, [place] Other Clinician: Referring Karter Hellmer: Treating Gibbs Naugle/Extender: Sherryl Manges in Treatment: 4 Wound Status Wound Number: 1 Primary Etiology: Diabetic Wound/Ulcer of the Lower Extremity Wound Location: Left, Lateral Lower Leg Wound Status: Open Wounding Event: Hematoma Date Acquired: 08/05/2022 Weeks Of Treatment: 4 Clustered Wound: Yes Wound Measurements Length: (cm) 3.1 Width: (cm) 2.1 Depth: (cm) 1.2 Area: (cm) 5.113 Volume: (cm) 6.136 % Reduction in Area: 85.5% % Reduction in Volume: 80.7% Wound Description Classification: Grade 2 Exudate Amount: Medium Exudate Type: Serosanguineous Exudate Color: red, brown Periwound Skin Texture Texture Color No Abnormalities Noted: No No Abnormalities Noted: No Moisture No Abnormalities Noted: No Treatment Notes Wound #1 (Lower Leg) Wound Laterality: Left, Lateral Cleanser Soap and Water Discharge Instruction: May shower and wash wound with dial antibacterial soap and water prior to dressing  change. Vashe 5.8 (oz) Discharge Instruction: Cleanse the wound with Vashe prior to applying a clean dressing using gauze sponges, not tissue or cotton balls. Peri-Wound Care Topical Primary Dressing NPWT Secondary Dressing Secured With Compression Wrap Kerlix Roll 4.5x3.1 (in/yd) Discharge Instruction: Apply Kerlix and Coban compression as directed. Coban Self-Adherent Wrap 4x5 (in/yd) Discharge Instruction: Apply over Kerlix as directed. Compression Stockings Add-Ons Electronic Signature(s) Signed: 10/05/2022 4:06:41 PM By: [name] RN, BSN Stevphen Rochester, Signed: 10/05/2022 4:06:41 PM By: [name] RN, BSN [place] ([id number]) [id number] Page 4 of 5 Entered By: [name] on 10/05/2022 08:03:43 -------------------------------------------------------------------------------- Wound Assessment Details Patient Name: Date of Service: APARNA, AISPURO 10/05/2022 7:30 A M Medical Record Number: [id number] Patient Account Number: [id number] Date of Birth/Sex: Treating RN: 08-25-1940 (82 y.o. [name] Long Primary Care Kimberlie Csaszar: PA Zenovia Jordan, [place] Other Clinician: Referring Omarius Grantham: Treating Ramiel Forti/Extender: Sherryl Manges in Treatment: 4 Wound Status Wound Number: 3 Primary Etiology: Infection - not elsewhere classified Wound Location: Left, Posterior Lower Leg Wound Status: Open Wounding Event: Trauma Date Acquired: 09/20/2022 Weeks Of Treatment: 2 Clustered Wound: No Wound Measurements Length: (cm) 1.8 Width: (cm) 3 Depth: (cm) 0.3 Area: (cm) 4.241 Volume: (cm) 1.272 % Reduction in Area: 2.2% % Reduction in Volume: -193.1% Wound Description Classification: Full Thickness Without Exposed Suppo Exudate Amount: Medium Exudate Type: Serosanguineous Exudate Color: red, brown rt Structures Periwound Skin Texture Texture Color No Abnormalities Noted: No No Abnormalities Noted: No Moisture No Abnormalities Noted: No Treatment  Notes Wound #3 (Lower Leg) Wound Laterality: Left, Posterior Cleanser Soap and Water Discharge Instruction: May shower and wash wound with dial antibacterial soap and water prior to dressing change. Vashe 5.8 (oz) Discharge Instruction: Cleanse the wound with Vashe prior to applying a clean dressing using gauze sponges, not tissue or cotton balls. Peri-Wound Care Topical Primary Dressing NPWT Secondary Dressing Secured With Compression Wrap Kerlix Roll 4.5x3.1 (in/yd) Discharge Instruction: Apply Kerlix and Coban compression as directed. Coban Self-Adherent Wrap 4x5 (in/yd) Discharge Instruction: Apply over Kerlix as directed. Compression Stockings [place], [place] ([id number]) [id number] Page 5 of 5 Add-Ons Electronic Signature(s) Signed: 10/05/2022 4:06:41 PM By: [name] RN, BSN Entered By: [name] on 10/05/2022 08:03:43

## 2022-10-05 NOTE — Progress Notes (Signed)
[name] Long, [name] Long ([id number]) [id number] Page 1 of 1 Visit Report for 10/05/2022 SuperBill Details Patient Name: Date of Service: [name] Long, [name] Long 10/05/2022 Medical Record Number: [id number] Patient Account Number: [id number] Date of Birth/Sex: Treating RN: [birth year] (82 y.o. 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

## 2022-10-09 ENCOUNTER — Encounter (HOSPITAL_BASED_OUTPATIENT_CLINIC_OR_DEPARTMENT_OTHER): Payer: Medicare Other | Admitting: General Surgery

## 2022-10-09 DIAGNOSIS — E11622 Type 2 diabetes mellitus with other skin ulcer: Secondary | ICD-10-CM | POA: Diagnosis not present

## 2022-10-09 NOTE — Progress Notes (Signed)
[place], [place] ([id number]) [id number] Page 1 of 7 Visit Report for 10/09/2022 Chief Complaint Document Details Patient Name: Date of Service: [name] Long 10/09/2022 8:15 A M Medical Record Number: [id number] Patient Account Number: [id number] Date of Birth/Sex: Treating RN: 11/23/1940 (82 y.o. F) Primary Care Provider: PA Zenovia Jordan, NO Other Clinician: Referring Provider: Treating Provider/Extender: Sherryl Manges in Treatment: 4 Information Obtained from: Patient Chief Complaint Patient seen for complaints of Non-Healing Wound. Electronic Signature(s) Signed: 10/09/2022 9:06:25 AM By: Duanne Guess MD FACS Entered By: Duanne Guess on 10/09/2022 09:06:25 -------------------------------------------------------------------------------- HPI Details Patient Name: Date of Service: [name] Long, [name] Long 10/09/2022 8:15 A M Medical Record Number: [id number] Patient Account Number: [id number] Date of Birth/Sex: Treating RN: Dec 13, 1940 (82 y.o. F) Primary Care Provider: PA TIENT, NO Other Clinician: Referring Provider: Treating Provider/Extender: Sherryl Manges in Treatment: 4 History of Present Illness HPI Description: ADMISSION 09/06/2022 This is an 82 year old type II diabetic (no A1c to review and patient is unaware of her last value) with a history of atrial fibrillation on chronic anticoagulant therapy, lower extremity edema, congestive heart failure, and stage III kidney disease. She divides her time between her home in [place] and here in [place] where her daughter lives. While in [place], she struck her leg on her bed frame and developed a large hematoma. She was evaluated in the emergency department there and imaging was negative for any deeper injury, such as a fracture. She returned to [facility] and has had an urgent care and emergency department visit to evaluate the site. As there had been no significant improvement in her wound, she was  referred to the wound care center for further evaluation and management. I am unable to see any evidence of an attempt to evacuate the hematoma in the electronic medical record.. There are 2 openings in her skin, but they connect to each other with significant tunneling and undermining. I am unsure if these opened spontaneously or there was a non- documented attempt to drain the hematoma site. The majority of her left lower leg from below the tibial tuberosity to just above the ankle and from the anterior tibia wrapping laterally and posteriorly to nearly the medial aspect of her leg is involved. There is a foul odor coming from her wound. There is no frank pus but there is significant retained clot that has likely served as culture media. ABI in clinic today was 0.84. 09/08/2022: The patient came in today as an add-on due to bloody drainage that was saturating her dressing. On inspection, it appeared that there was no frank fresh blood and no active site of bleeding identified; it appears that more of the hematoma was liquefied and draining from her wound. 09/13/2022: The wound has cleaned up considerably. There is no longer any foul odor and the drainage has decreased significantly. The patient is also complaining of less pain. There is still some nonviable subcutaneous tissue and fat present but overall everything is improved from the last visit. Her culture grew out a polymicrobial population of multiple drug-resistant organisms. She is currently taking Augmentin and levofloxacin as suggested by the culture data. 09/20/2022: The cavity is contracting nicely. There is no longer a connection between the posterior wound and the lateral leg wound. The posterior wound has a little bit of slough on the surface but the larger main wound is very clean. She has been approved for a wound VAC and we will apply that today. 09/25/2022: Post Wound sloughly- alginate Ag. Vac on lateral wound;  decreased  undermining. 10/02/2022: There is a little bit of slough on the posterior wound. The lateral leg wound continues to contract at a remarkable rate. It is clean without any slough or other debris accumulation. 10/09/2022: The posterior wound is about half the size as it was last week. The lateral leg wound has contracted remarkably. There is still some undermining from 9-12 o'clock. Everything is very clean. [place], [place] ([id number]) [id number] Page 2 of 7 Electronic Signature(s) Signed: 10/09/2022 9:10:22 AM By: Duanne Guess MD FACS Entered By: Duanne Guess on 10/09/2022 09:10:21 -------------------------------------------------------------------------------- Physical Exam Details Patient Name: Date of Service: [name] Long, [name] Long 10/09/2022 8:15 A M Medical Record Number: [id number] Patient Account Number: [id number] Date of Birth/Sex: Treating RN: [birth year] (82 y.o. [name] Provider: PA Zenovia Jordan, [place] Other Clinician: Referring Provider: Treating Provider/Extender: Sherryl Manges in Treatment: 4 Constitutional Hypertensive, asymptomatic. . . . no acute distress. Respiratory Normal work of breathing on room air. Notes 10/09/2022: The posterior wound is about half the size as it was last week. The lateral leg wound has contracted remarkably. There is still some undermining from 9-12 o'clock. Everything is very clean. Electronic Signature(s) Signed: 10/09/2022 9:10:49 AM By: Duanne Guess MD FACS Entered By: Duanne Guess on 10/09/2022 09:10:49 -------------------------------------------------------------------------------- Physician Orders Details Patient Name: Date of Service: [name] Long, [name] Long 10/09/2022 8:15 A M Medical Record Number: [id number] Patient Account Number: [id number] Date of Birth/Sex: Treating RN: 01-01-1941 (82 y.o. [name] Provider: PA Zenovia Jordan, [place] Other Clinician: Referring Provider: Treating  Provider/Extender: Sherryl Manges in Treatment: 4 Verbal / Phone Orders: No Diagnosis Coding ICD-10 Coding Code Description [id number] 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 Z79.01 Long term (current) use of anticoagulants I50.42 Chronic combined systolic (congestive) and diastolic (congestive) heart failure N18.30 Chronic kidney disease, stage 3 unspecified Follow-up Appointments ppointment in 1 week. - Dr. Lady Gary - RM 1 Return A Monday 8/12 at 9:00 AM Nurse Visit: - RM 1 Thurs 8/8 @ 07:30 am Anesthetic (In clinic) Topical Lidocaine 4% applied to wound bed L[place], [name] ([id number]) [phone].pdf Page 3 of 7 Bathing/ Shower/ Hygiene May shower with protection but do not get wound dressing(s) wet. Protect dressing(s) with water repellant cover (for example, large plastic bag) or a cast cover and may then take shower. Negative Presssure Wound Therapy Medela Wound Vac continuously at 17mm/hg - bridge wound 1 and 2 together Black Foam Edema Control - Lymphedema / SCD / Other Elevate legs to the level of the heart or above for 30 minutes daily and/or when sitting for 3-4 times a day throughout the day. Avoid standing for long periods of time. Exercise regularly Wound Treatment Wound #1 - Lower Leg Wound Laterality: Left, Lateral Cleanser: Soap and Water 2 x Per Week/30 Days Discharge Instructions: May shower and wash wound with dial antibacterial soap and water prior to dressing change. Cleanser: Vashe 5.8 (oz) 2 x Per Week/30 Days Discharge Instructions: Cleanse the wound with Vashe prior to applying a clean dressing using gauze sponges, not tissue or cotton balls. Prim Dressing: NPWT ary 2 x Per Week/30 Days Compression Wrap: Kerlix Roll 4.5x3.1 (in/yd) 2 x Per Week/30 Days Discharge Instructions: Apply Kerlix and Coban compression as  directed. Compression Wrap: Coban Self-Adherent Wrap 4x5 (in/yd) 2 x Per Week/30 Days Discharge Instructions: Apply over Kerlix as directed. Wound #3 - Lower Leg Wound Laterality: Left, Posterior Cleanser: Soap and  Water 2 x Per Week/30 Days Discharge Instructions: May shower and wash wound with dial antibacterial soap and water prior to dressing change. Cleanser: Vashe 5.8 (oz) 2 x Per Week/30 Days Discharge Instructions: Cleanse the wound with Vashe prior to applying a clean dressing using gauze sponges, not tissue or cotton balls. Prim Dressing: NPWT ary 2 x Per Week/30 Days Compression Wrap: Kerlix Roll 4.5x3.1 (in/yd) 2 x Per Week/30 Days Discharge Instructions: Apply Kerlix and Coban compression as directed. Compression Wrap: Coban Self-Adherent Wrap 4x5 (in/yd) 2 x Per Week/30 Days Discharge Instructions: Apply over Kerlix as directed. Electronic Signature(s) Signed: 10/09/2022 9:50:05 AM By: Duanne Guess MD FACS Entered By: Duanne Guess on 10/09/2022 09:11:01 -------------------------------------------------------------------------------- Problem List Details Patient Name: Date of Service: [name] Long, Annelisa 10/09/2022 8:15 A M Medical Record Number: [id number] Patient Account Number: [id number] Date of Birth/Sex: Treating RN: 07-18-1940 (82 y.o. [name] Provider: PA Zenovia Jordan, [place] Other Clinician: Referring Provider: Treating Provider/Extender: Sherryl Manges in Treatment: 4 Active Problems ICD-10 Encounter Code Description Active Date MDM Diagnosis L97.822 Non-pressure chronic ulcer of other part of left lower leg with fat layer exposed7/05/2022 No Yes ADDALEY, [name] Long ([id number]) [id number] Page 4 of 7 E11.622 Type 2 diabetes mellitus with other skin ulcer 09/06/2022 No Yes Z79.01 Long term (current) use of anticoagulants 09/06/2022 No Yes I50.42 Chronic combined systolic (congestive) and diastolic (congestive) heart failure  09/06/2022 No Yes N18.30 Chronic kidney disease, stage 3 unspecified 09/06/2022 No Yes Inactive Problems Resolved Problems Electronic Signature(s) Signed: 10/09/2022 9:06:05 AM By: Duanne Guess MD FACS Entered By: Duanne Guess on 10/09/2022 09:06:05 -------------------------------------------------------------------------------- Progress Note Details Patient Name: Date of Service: [name] Long, [name] Long 10/09/2022 8:15 A M Medical Record Number: [id number] Patient Account Number: [id number] Date of Birth/Sex: Treating RN: 1940/11/08 (82 y.o. [name] Provider: PA Zenovia Jordan, [place] Other Clinician: Referring Provider: Treating Provider/Extender: Sherryl Manges in Treatment: 4 Subjective Chief Complaint Information obtained from Patient Patient seen for complaints of Non-Healing Wound. History of Present Illness (HPI) ADMISSION 09/06/2022 This is an 82 year old type II diabetic (no A1c to review and patient is unaware of her last value) with a history of atrial fibrillation on chronic anticoagulant therapy, lower extremity edema, congestive heart failure, and stage III kidney disease. She divides her time between her home in [place] and here in [place] where her daughter lives. While in [place], she struck her leg on her bed frame and developed a large hematoma. She was evaluated in the emergency department there and imaging was negative for any deeper injury, such as a fracture. She returned to [facility] and has had an urgent care and emergency department visit to evaluate the site. As there had been no significant improvement in her wound, she was referred to the wound care center for further evaluation and management. I am unable to see any evidence of an attempt to evacuate the hematoma in the electronic medical record.. There are 2 openings in her skin, but they connect to each other with significant tunneling and undermining. I am unsure if these opened  spontaneously or there was a non- documented attempt to drain the hematoma site. The majority of her left lower leg from below the tibial tuberosity to just above the ankle and from the anterior tibia wrapping laterally and posteriorly to nearly the medial aspect of her leg is involved. There is a foul odor coming from her wound. There is no frank pus but there is significant retained clot that has  likely served as culture media. ABI in clinic today was 0.84. 09/08/2022: The patient came in today as an add-on due to bloody drainage that was saturating her dressing. On inspection, it appeared that there was no frank fresh blood and no active site of bleeding identified; it appears that more of the hematoma was liquefied and draining from her wound. 09/13/2022: The wound has cleaned up considerably. There is no longer any foul odor and the drainage has decreased significantly. The patient is also complaining of less pain. There is still some nonviable subcutaneous tissue and fat present but overall everything is improved from the last visit. Her culture grew out a polymicrobial population of multiple drug-resistant organisms. She is currently taking Augmentin and levofloxacin as suggested by the culture data. 09/20/2022: The cavity is contracting nicely. There is no longer a connection between the posterior wound and the lateral leg wound. The posterior wound has a little bit of slough on the surface but the larger main wound is very clean. She has been approved for a wound VAC and we will apply that today. 09/25/2022: Post Wound sloughly- alginate Ag. Vac on lateral wound; decreased undermining. 10/02/2022: There is a little bit of slough on the posterior wound. The lateral leg wound continues to contract at a remarkable rate. It is clean without any slough or other debris accumulation. 10/09/2022: The posterior wound is about half the size as it was last week. The lateral leg wound has contracted remarkably.  There is still some undermining from 9-12 o'clock. Everything is very clean. [name] Long, [place] ([id number]) [id number] Page 5 of 7 Patient History Information obtained from Patient. Family History No family history of Hereditary Spherocytosis, Hypertension, Kidney Disease, Lung Disease, Seizures, Stroke, Thyroid Problems, Tuberculosis. Social History Never smoker, Marital Status - Widowed, Alcohol Use - Never, Drug Use - No History, Caffeine Use - Never. Medical History Cardiovascular Patient has history of Arrhythmia - A fib, Congestive Heart Failure, Hypertension Endocrine Patient has history of Type II Diabetes - since 2019 Medical A Surgical History Notes nd Cardiovascular High Cholesterol Genitourinary Kidney Disease Objective Constitutional Hypertensive, asymptomatic. no acute distress. Vitals Time Taken: 8:29 AM, Height: 64 in, Weight: 162 lbs, BMI: 27.8, Temperature: 97.9 F, Pulse: 79 bpm, Respiratory Rate: 18 breaths/min, Blood Pressure: 157/68 mmHg, Capillary Blood Glucose: 129 mg/dl. General Notes: glucose per pt report this am Respiratory Normal work of breathing on room air. General Notes: 10/09/2022: The posterior wound is about half the size as it was last week. The lateral leg wound has contracted remarkably. There is still some undermining from 9-12 o'clock. Everything is very clean. Integumentary (Hair, Skin) Wound #1 status is Open. Original cause of wound was Hematoma. The date acquired was: 08/05/2022. The wound has been in treatment 4 weeks. The wound is located on the Left,Lateral Lower Leg. The wound measures 3cm length x 1.7cm width x 0.8cm depth; 4.006cm^2 area and 3.204cm^3 volume. There is Fat Layer (Subcutaneous Tissue) exposed. There is no tunneling noted, however, there is undermining starting at 9:00 and ending at 12:00 with a maximum distance of 2cm. There is a medium amount of serosanguineous drainage noted. The wound margin is  well defined and not attached to the wound base. There is large (67-100%) red granulation within the wound bed. There is no necrotic tissue within the wound bed. The periwound skin appearance had no abnormalities noted for texture. The periwound skin appearance had no abnormalities noted for moisture. The periwound skin appearance exhibited: Hemosiderin Staining. Periwound temperature was  noted as No Abnormality. Wound #3 status is Open. Original cause of wound was Trauma. The date acquired was: 09/20/2022. The wound has been in treatment 2 weeks. The wound is located on the Left,Posterior Lower Leg. The wound measures 1.2cm length x 2cm width x 0.1cm depth; 1.885cm^2 area and 0.188cm^3 volume. There is Fat Layer (Subcutaneous Tissue) exposed. There is no tunneling or undermining noted. There is a medium amount of serosanguineous drainage noted. The wound margin is flat and intact. There is large (67-100%) red granulation within the wound bed. There is no necrotic tissue within the wound bed. The periwound skin appearance had no abnormalities noted for texture. The periwound skin appearance had no abnormalities noted for moisture. The periwound skin appearance exhibited: Hemosiderin Staining. Periwound temperature was noted as No Abnormality. Assessment Active Problems ICD-10 Non-pressure chronic ulcer of other part of left lower leg with fat layer exposed Type 2 diabetes mellitus with other skin ulcer Long term (current) use of anticoagulants Chronic combined systolic (congestive) and diastolic (congestive) heart failure Chronic kidney disease, stage 3 unspecified Plan Follow-up Appointments: Return Appointment in 1 week. - Dr. Lady Gary - RM 1 Monday 8/12 at 9:00 AM [place], [name] Long ([id number]) [phone].pdf Page 6 of 7 Nurse Visit: - RM 1 Thurs 8/8 @ 07:30 am Anesthetic: (In clinic) Topical Lidocaine 4% applied to wound bed Bathing/ Shower/ Hygiene: May shower with  protection but do not get wound dressing(s) wet. Protect dressing(s) with water repellant cover (for example, large plastic bag) or a cast cover and may then take shower. Negative Presssure Wound Therapy: Medela Wound Vac continuously at 154mm/hg - bridge wound 1 and 2 together Black Foam Edema Control - Lymphedema / SCD / Other: Elevate legs to the level of the heart or above for 30 minutes daily and/or when sitting for 3-4 times a day throughout the day. Avoid standing for long periods of time. Exercise regularly WOUND #1: - Lower Leg Wound Laterality: Left, Lateral Cleanser: Soap and Water 2 x Per Week/30 Days Discharge Instructions: May shower and wash wound with dial antibacterial soap and water prior to dressing change. Cleanser: Vashe 5.8 (oz) 2 x Per Week/30 Days Discharge Instructions: Cleanse the wound with Vashe prior to applying a clean dressing using gauze sponges, not tissue or cotton balls. Prim Dressing: NPWT 2 x Per Week/30 Days ary Com pression Wrap: Kerlix Roll 4.5x3.1 (in/yd) 2 x Per Week/30 Days Discharge Instructions: Apply Kerlix and Coban compression as directed. Com pression Wrap: Coban Self-Adherent Wrap 4x5 (in/yd) 2 x Per Week/30 Days Discharge Instructions: Apply over Kerlix as directed. WOUND #3: - Lower Leg Wound Laterality: Left, Posterior Cleanser: Soap and Water 2 x Per Week/30 Days Discharge Instructions: May shower and wash wound with dial antibacterial soap and water prior to dressing change. Cleanser: Vashe 5.8 (oz) 2 x Per Week/30 Days Discharge Instructions: Cleanse the wound with Vashe prior to applying a clean dressing using gauze sponges, not tissue or cotton balls. Prim Dressing: NPWT 2 x Per Week/30 Days ary Com pression Wrap: Kerlix Roll 4.5x3.1 (in/yd) 2 x Per Week/30 Days Discharge Instructions: Apply Kerlix and Coban compression as directed. Com pression Wrap: Coban Self-Adherent Wrap 4x5 (in/yd) 2 x Per Week/30 Days Discharge  Instructions: Apply over Kerlix as directed. 10/09/2022: The posterior wound is about half the size as it was last week. The lateral leg wound has contracted remarkably. There is still some undermining from 9-12 o'clock. Everything is very clean. No debridement was necessary today. We will continue negative pressure  wound therapy. She will nurse visit later in the week to change her VAC and follow-up with me in a week. Electronic Signature(s) Signed: 10/09/2022 9:11:44 AM By: Duanne Guess MD FACS Entered By: Duanne Guess on 10/09/2022 09:11:44 -------------------------------------------------------------------------------- HxROS Details Patient Name: Date of Service: [name] Long, [name] Long 10/09/2022 8:15 A M Medical Record Number: [id number] Patient Account Number: [id number] Date of Birth/Sex: Treating RN: 1940/06/10 (82 y.o. [name] Provider: PA Zenovia Jordan, [place] Other Clinician: Referring Provider: Treating Provider/Extender: Sherryl Manges in Treatment: 4 Information Obtained From Patient Cardiovascular Medical History: Positive for: Arrhythmia - A fib; Congestive Heart Failure; Hypertension Past Medical History Notes: High Cholesterol Endocrine Medical History: Positive for: Type II Diabetes - since 2019 [name] Long, [name] Long ([id number]) [id number] Page 7 of 7 Genitourinary Medical History: Past Medical History Notes: Kidney Disease Immunizations Pneumococcal Vaccine: Received Pneumococcal Vaccination: No Implantable Devices No devices added Family and Social History Hereditary Spherocytosis: No; Hypertension: No; Kidney Disease: No; Lung Disease: No; Seizures: No; Stroke: No; Thyroid Problems: No; Tuberculosis: No; Never smoker; Marital Status - Widowed; Alcohol Use: Never; Drug Use: No History; Caffeine Use: Never; Financial Concerns: No; Food, Clothing or Shelter Needs: No; Support System Lacking: No; Transportation Concerns:  No Electronic Signature(s) Signed: 10/09/2022 9:50:05 AM By: Duanne Guess MD FACS Signed: 10/09/2022 5:40:23 PM By: [name] RN, BSN Entered By: Duanne Guess on 10/09/2022 09:10:27 -------------------------------------------------------------------------------- SuperBill Details Patient Name: Date of Service: [name] Long, [name] Long 10/09/2022 Medical Record Number: [id number] Patient Account Number: [id number] Date of Birth/Sex: Treating RN: 08/25/1940 (82 y.o. [name] Provider: PA Zenovia Jordan, [place] Other Clinician: Referring Provider: Treating Provider/Extender: Sherryl Manges in Treatment: 4 Diagnosis Coding ICD-10 Codes Code Description [phone] 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 Z79.01 Long term (current) use of anticoagulants I50.42 Chronic combined systolic (congestive) and diastolic (congestive) heart failure N18.30 Chronic kidney disease, stage 3 unspecified Facility Procedures : CPT4 Code: [id number] Description: 34742 - WOUND VAC-50 SQ CM OR LESS Modifier: Quantity: 1 Physician Procedures : CPT4 Code Description Modifier [id number] - WC PHYS LEVEL 4 - EST PT ICD-10 Diagnosis Description L97.822 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 Z79.01  Long term (current) use of anticoagulants I50.42 Chronic combined systolic (congestive) and diastolic (congestive) heart failure Quantity: 1 Electronic Signature(s) Signed: 10/09/2022 9:12:46 AM By: Duanne Guess MD FACS Entered By: Duanne Guess on 10/09/2022 09:12:45

## 2022-10-09 NOTE — Progress Notes (Signed)
[place], [place] ([id number]) [id number] Page 1 of 9 Visit Report for 10/09/2022 Arrival Information Details Patient Name: Date of Service: [name] Long, [name] Long 10/09/2022 8:15 A M Medical Record Number: [id number] Patient Account Number: [id number] Date of Birth/Sex: Treating RN: [birth year] (82 y.o. [name] Long, [name] Primary Care : PA Zenovia Jordan, [place] Other Clinician: Referring : Treating /Extender: Sherryl Manges in Treatment: 4 Visit Information History Since Last Visit Added or deleted any medications: No Patient Arrived: Walker Any new allergies or adverse reactions: No Arrival Time: 08:28 Had a fall or experienced change in No Accompanied By: daughter activities of daily living that may affect Transfer Assistance: None risk of falls: Patient Identification Verified: Yes Signs or symptoms of abuse/neglect since last visito No Secondary Verification Process Completed: Yes Hospitalized since last visit: No Patient Requires Transmission-Based Precautions: No Implantable device outside of the clinic excluding No Patient Has Alerts: No cellular tissue based products placed in the center since last visit: Has Dressing in Place as Prescribed: Yes Has Compression in Place as Prescribed: Yes Pain Present Now: No Electronic Signature(s) Signed: 10/09/2022 5:40:23 PM By: [name] RN, BSN Entered By: [name] on 10/09/2022 08:29:48 -------------------------------------------------------------------------------- Encounter Discharge Information Details Patient Name: Date of Service: [name], [name] Long 10/09/2022 8:15 A M Medical Record Number: [id number] Patient Account Number: [id number] Date of Birth/Sex: Treating RN: Oct 29, 1940 (82 y.o. [name] Long Primary Care : PA Zenovia Jordan, [place] Other Clinician: Referring : Treating /Extender: Sherryl Manges in Treatment: 4 Encounter Discharge Information  Items Discharge Condition: Stable Ambulatory Status: Walker Discharge Destination: Home Transportation: Private Auto Accompanied By: daughter Schedule Follow-up Appointment: Yes Clinical Summary of Care: Patient Declined Electronic Signature(s) Signed: 10/09/2022 5:40:23 PM By: [name] RN, BSN Entered By: [name] on 10/09/2022 09:10:08 [place], [name] ([id number]) [id number] Page 2 of 9 -------------------------------------------------------------------------------- Lower Extremity Assessment Details Patient Name: Date of Service: [name] Long, [name] Long 10/09/2022 8:15 A M Medical Record Number: [id number] Patient Account Number: [id number] Date of Birth/Sex: Treating RN: [birth year] (82 y.o. [name] Long Primary Care : PA Zenovia Jordan, [place] Other Clinician: Referring : Treating /Extender: Sherryl Manges in Treatment: 4 Edema Assessment Assessed: [Left: No] [Right: No] Edema: [Left: Ye] [Right: s] Calf Left: Right: Point of Measurement: From Medial Instep 34 cm Ankle Left: Right: Point of Measurement: From Medial Instep 25 cm Vascular Assessment Pulses: Dorsalis Pedis Palpable: [Left:Yes] Extremity colors, hair growth, and conditions: Extremity Color: [Left:Hyperpigmented] Hair Growth on Extremity: [Left:No] Temperature of Extremity: [Left:Warm < 3 seconds] Electronic Signature(s) Signed: 10/09/2022 5:40:23 PM By: [name] RN, BSN Entered By: [name] on 10/09/2022 08:34:36 -------------------------------------------------------------------------------- Multi Wound Chart Details Patient Name: Date of Service: [name] Long, [name] Long 10/09/2022 8:15 A M Medical Record Number: [id number] Patient Account Number: [id number] Date of Birth/Sex: Treating RN: [birth year] (82 y.o. F) Primary Care : PA TIENT, NO Other Clinician: Referring : Treating /Extender: Sherryl Manges in Treatment:  4 Vital Signs Height(in): 64 Capillary Blood Glucose(mg/dl): 416 Weight(lbs): 606 Pulse(bpm): 79 Body Mass Index(BMI): 27.8 Blood Pressure(mmHg): 157/68 Temperature(F): 97.9 Respiratory Rate(breaths/min): 18 [1:Photos:] [N/A:N/A [id number] Page 3 of 9] Left, Lateral Lower Leg Left, Posterior Lower Leg N/A Wound Location: Hematoma Trauma N/A Wounding Event: Diabetic Wound/Ulcer of the Lower Infection - not elsewhere classified N/A Primary Etiology: Extremity Arrhythmia, Congestive Heart Failure, Arrhythmia, Congestive Heart Failure, N/A Comorbid History: Hypertension, Type II Diabetes Hypertension, Type II Diabetes 08/05/2022 09/20/2022 N/A Date Acquired: 4 2 N/A Weeks of Treatment: Open Open N/A Wound Status: No No N/A Wound Recurrence: Yes No  N/A Clustered Wound: 3x1.7x0.8 1.2x2x0.1 N/A Measurements L x W x D (cm) 4.006 1.885 N/A A (cm) : rea 3.204 0.188 N/A Volume (cm) : 88.70% 56.50% N/A % Reduction in A rea: 89.90% 56.70% N/A % Reduction in Volume: 9 Starting Position 1 (o'clock): 12 Ending Position 1 (o'clock): 2 Maximum Distance 1 (cm): Yes No N/A Undermining: Grade 2 Full Thickness Without Exposed N/A Classification: Support Structures Medium Medium N/A Exudate Amount: Serosanguineous Serosanguineous N/A Exudate Type: red, brown red, brown N/A Exudate Color: Well defined, not attached Flat and Intact N/A Wound Margin: Large (67-100%) Large (67-100%) N/A Granulation Amount: Red Red N/A Granulation Quality: None Present (0%) None Present (0%) N/A Necrotic Amount: Fat Layer (Subcutaneous Tissue): Yes Fat Layer (Subcutaneous Tissue): Yes N/A Exposed Structures: Fascia: No Fascia: No Tendon: No Tendon: No Muscle: No Muscle: No Joint: No Joint: No Bone: No Bone: No None Small (1-33%) N/A Epithelialization: No Abnormalities Noted No Abnormalities Noted N/A Periwound Skin Texture: No Abnormalities Noted No  Abnormalities Noted N/A Periwound Skin Moisture: Hemosiderin Staining: Yes Hemosiderin Staining: Yes N/A Periwound Skin Color: No Abnormality No Abnormality N/A Temperature: Treatment Notes Electronic Signature(s) Signed: 10/09/2022 9:06:18 AM By: Duanne Guess MD FACS Entered By: Duanne Guess on 10/09/2022 09:06:17 -------------------------------------------------------------------------------- Multi-Disciplinary Care Plan Details Patient Name: Date of Service: [name], [name] Long 10/09/2022 8:15 A M Medical Record Number: [id number] Patient Account Number: [id number] Date of Birth/Sex: Treating RN: 15-Mar-1940 (82 y.o. [name] Long Primary Care : PA Zenovia Jordan, [place] Other Clinician: Referring : Treating /Extender: Sherryl Manges in Treatment: 4 Multidisciplinary Care Plan reviewed with physician Active Inactive Wound/Skin Impairment Nursing Diagnoses: Impaired tissue integrity [place], [name] ([id number]) [id number] Page 4 of 9 Goals: Patient/caregiver will verbalize understanding of skin care regimen Date Initiated: 09/06/2022 Target Resolution Date: 11/04/2022 Goal Status: Active Interventions: Assess patient/caregiver ability to obtain necessary supplies Assess patient/caregiver ability to perform ulcer/skin care regimen upon admission and as needed Assess ulceration(s) every visit Provide education on ulcer and skin care Screen for HBO Treatment Activities: Skin care regimen initiated : 09/06/2022 Topical wound management initiated : 09/06/2022 Notes: Electronic Signature(s) Signed: 10/09/2022 5:40:23 PM By: [name] RN, BSN Entered By: [name] on 10/09/2022 08:43:45 -------------------------------------------------------------------------------- Negative Pressure Wound Therapy Maintenance (NPWT) Details Patient Name: Date of Service: AVITAL, MANZI 10/09/2022 8:15 A M Medical Record Number: [id number] Patient  Account Number: [id number] Date of Birth/Sex: Treating RN: Jan 30, 1941 (82 y.o. [name] Long Primary Care : PA Zenovia Jordan, [place] Other Clinician: Referring : Treating /Extender: Sherryl Manges in Treatment: 4 NPWT Maintenance Performed for: Wound #1 Left, Lateral Lower Leg Additional Injuries Covered: Yes Additional Injuries: Wound #3 Left, Posterior Lower Leg Performed By: [name], RN Type: Med[employer] Coverage Size (sq cm): 7.5 Pressure Type: Constant Pressure Setting: 125 mmHG Drain Type: None Primary Contact: None Sponge/Dressing Type: Foam- Black Date Initiated: 09/20/2022 Dressing Removed: Yes Quantity of Sponges/Gauze Removed: 2 Canister Changed: No Canister Exudate Volume: 10 Dressing Reapplied: Yes Quantity of Sponges/Gauze Inserted: 2 Respones T Treatment: o good Days On NPWT : 20 Post Procedure Diagnosis Same as Pre-procedure Electronic Signature(s) Signed: 10/09/2022 5:40:23 PM By: [name] RN, BSN Entered By: [name] on 10/09/2022 09:09:17 [place], [name] ([id number]) [id number] Page 5 of 9 -------------------------------------------------------------------------------- Pain Assessment Details Patient Name: Date of Service: RONICA, MCFARLEN 10/09/2022 8:15 A M Medical Record Number: [id number] Patient Account Number: [id number] Date of Birth/Sex: Treating RN: 1940/06/13 (82 y.o. [name] Long Primary Care : PA Zenovia Jordan, [place] Other Clinician: Referring :  Treating /Extender: Sherryl Manges in Treatment: 4 Active Problems Location of Pain Severity and Description of Pain Patient Has Paino No Site Locations Rate the pain. Current Pain Level: 0 Pain Management and Medication Current Pain Management: Electronic Signature(s) Signed: 10/09/2022 5:40:23 PM By: [name] RN, BSN Entered By: [name] on 10/09/2022  08:30:40 -------------------------------------------------------------------------------- Patient/Caregiver Education Details Patient Name: Date of Service: [name] Long, Deysha 8/5/2024andnbsp8:15 A M Medical Record Number: [id number] Patient Account Number: [id number] Date of Birth/Gender: Treating RN: Aug 28, 1940 (82 y.o. [name] Long Primary Care Physician: PA Zenovia Jordan, [place] Other Clinician: Referring Physician: Treating Physician/Extender: Sherryl Manges in Treatment: 4 Education Assessment Education Provided To: Patient Education Topics Provided Venous: Methods: Explain/Verbal Responses: Reinforcements needed, State content correctly Wound/Skin Impairment: Methods: Explain/Verbal Responses: Reinforcements needed, State content correctly [place], [name] ([id number]) [id number] Page 6 of 9 Electronic Signature(s) Signed: 10/09/2022 5:40:23 PM By: [name] RN, BSN Entered By: [name] on 10/09/2022 08:44:13 -------------------------------------------------------------------------------- Wound Assessment Details Patient Name: Date of Service: [name] Long, [name] Long 10/09/2022 8:15 A M Medical Record Number: [id number] Patient Account Number: [id number] Date of Birth/Sex: Treating RN: 1941-02-19 (82 y.o. [name] Long Primary Care : PA Zenovia Jordan, [place] Other Clinician: Referring : Treating /Extender: Sherryl Manges in Treatment: 4 Wound Status Wound Number: 1 Primary Diabetic Wound/Ulcer of the Lower Extremity Etiology: Wound Location: Left, Lateral Lower Leg Wound Status: Open Wounding Event: Hematoma Comorbid Arrhythmia, Congestive Heart Failure, Hypertension, Type II Date Acquired: 08/05/2022 History: Diabetes Weeks Of Treatment: 4 Clustered Wound: Yes Photos Wound Measurements Length: (cm) 3 Width: (cm) 1.7 Depth: (cm) 0.8 Area: (cm) 4.006 Volume: (cm) 3.204 % Reduction in Area: 88.7% % Reduction in Volume:  89.9% Epithelialization: None Tunneling: No Undermining: Yes Starting Position (o'clock): 9 Ending Position (o'clock): 12 Maximum Distance: (cm) 2 Wound Description Classification: Grade 2 Wound Margin: Well defined, not attached Exudate Amount: Medium Exudate Type: Serosanguineous Exudate Color: red, brown Foul Odor After Cleansing: No Slough/Fibrino No Wound Bed Granulation Amount: Large (67-100%) Exposed Structure Granulation Quality: Red Fascia Exposed: No Necrotic Amount: None Present (0%) Fat Layer (Subcutaneous Tissue) Exposed: Yes Tendon Exposed: No Muscle Exposed: No Joint Exposed: No Bone Exposed: No Periwound Skin Texture Texture Color No Abnormalities Noted: Yes No Abnormalities Noted: No Hemosiderin Staining[address] [place], [place] ([id number]) [phone].pdf Page 7 of 9 No Abnormalities Noted: Yes Temperature / Pain Temperature: No Abnormality Treatment Notes Wound #1 (Lower Leg) Wound Laterality: Left, Lateral Cleanser Soap and Water Discharge Instruction: May shower and wash wound with dial antibacterial soap and water prior to dressing change. Vashe 5.8 (oz) Discharge Instruction: Cleanse the wound with Vashe prior to applying a clean dressing using gauze sponges, not tissue or cotton balls. Peri-Wound Care Topical Primary Dressing NPWT Secondary Dressing Secured With Compression Wrap Kerlix Roll 4.5x3.1 (in/yd) Discharge Instruction: Apply Kerlix and Coban compression as directed. Coban Self-Adherent Wrap 4x5 (in/yd) Discharge Instruction: Apply over Kerlix as directed. Compression Stockings Add-Ons Electronic Signature(s) Signed: 10/09/2022 5:40:23 PM By: [name] RN, BSN Entered By: [name] on 10/09/2022 08:42:57 -------------------------------------------------------------------------------- Wound Assessment Details Patient Name: Date of Service: LITZI, KEZIAH 10/09/2022 8:15 A M Medical Record  Number: [id number] Patient Account Number: [id number] Date of Birth/Sex: Treating RN: October 05, 1940 (82 y.o. [name] Long Primary Care : PA Zenovia Jordan, [place] Other Clinician: Referring : Treating /Extender: Sherryl Manges in Treatment: 4 Wound Status Wound Number: 3 Primary Infection - not elsewhere classified Etiology: Wound Location: Left, Posterior Lower Leg Wound Status: Open Wounding Event: Trauma Comorbid  Arrhythmia, Congestive Heart Failure, Hypertension, Type II Date Acquired: 09/20/2022 History: Diabetes Weeks Of Treatment: 2 Clustered Wound: No Photos [place], [place] ([id number]) [id number] Page 8 of 9 Wound Measurements Length: (cm) 1.2 Width: (cm) 2 Depth: (cm) 0.1 Area: (cm) 1.885 Volume: (cm) 0.188 % Reduction in Area: 56.5% % Reduction in Volume: 56.7% Epithelialization: Small (1-33%) Tunneling: No Undermining: No Wound Description Classification: Full Thickness Without Exposed Sup Wound Margin: Flat and Intact Exudate Amount: Medium Exudate Type: Serosanguineous Exudate Color: red, brown port Structures Wound Bed Granulation Amount: Large (67-100%) Exposed Structure Granulation Quality: Red Fascia Exposed: No Necrotic Amount: None Present (0%) Fat Layer (Subcutaneous Tissue) Exposed: Yes Tendon Exposed: No Muscle Exposed: No Joint Exposed: No Bone Exposed: No Periwound Skin Texture Texture Color No Abnormalities Noted: Yes No Abnormalities Noted: No Hemosiderin Staining: Yes Moisture No Abnormalities Noted: Yes Temperature / Pain Temperature: No Abnormality Treatment Notes Wound #3 (Lower Leg) Wound Laterality: Left, Posterior Cleanser Soap and Water Discharge Instruction: May shower and wash wound with dial antibacterial soap and water prior to dressing change. Vashe 5.8 (oz) Discharge Instruction: Cleanse the wound with Vashe prior to applying a clean dressing using gauze sponges, not tissue  or cotton balls. Peri-Wound Care Topical Primary Dressing NPWT Secondary Dressing Secured With Compression Wrap Kerlix Roll 4.5x3.1 (in/yd) Discharge Instruction: Apply Kerlix and Coban compression as directed. Coban Self-Adherent Wrap 4x5 (in/yd) Discharge Instruction: Apply over Kerlix as directed. Compression Stockings Add-Ons Electronic Signature(s) [place], [place] ([id number]) [id number] Page 9 of 9 Signed: 10/09/2022 5:40:23 PM By: [name] RN, BSN Entered By: [name] on 10/09/2022 08:43:33 -------------------------------------------------------------------------------- Vitals Details Patient Name: Date of Service: [name], [name] Long 10/09/2022 8:15 A M Medical Record Number: [id number] Patient Account Number: [id number] Date of Birth/Sex: Treating RN: 1940/08/08 (82 y.o. [name] Long Primary Care : PA Zenovia Jordan, [place] Other Clinician: Referring : Treating /Extender: Sherryl Manges in Treatment: 4 Vital Signs Time Taken: 08:29 Temperature (F): 97.9 Height (in): 64 Pulse (bpm): 79 Weight (lbs): 162 Respiratory Rate (breaths/min): 18 Body Mass Index (BMI): 27.8 Blood Pressure (mmHg): 157/68 Capillary Blood Glucose (mg/dl): 601 Reference Range: 80 - 120 mg / dl Notes glucose per pt report this am Electronic Signature(s) Signed: 10/09/2022 5:40:23 PM By: [name] RN, BSN Entered By: [name] on 10/09/2022 08:30:32

## 2022-10-12 ENCOUNTER — Encounter (HOSPITAL_BASED_OUTPATIENT_CLINIC_OR_DEPARTMENT_OTHER): Payer: Medicare Other | Admitting: General Surgery

## 2022-10-12 DIAGNOSIS — E11622 Type 2 diabetes mellitus with other skin ulcer: Secondary | ICD-10-CM | POA: Diagnosis not present

## 2022-10-12 NOTE — Progress Notes (Signed)
[place], [place] ([id number]) [id number] Page 1 of 1 Visit Report for 10/12/2022 SuperBill Details Patient Name: Date of Service: [name] Long, [name] Long 10/12/2022 Medical Record Number: [id number] Patient Account Number: [id number] Date of Birth/Sex: Treating RN: [birth year] (82 y.o. 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

## 2022-10-12 NOTE — Progress Notes (Signed)
[place], [place] ([id number]) [id number] Page 1 of 5 Visit Report for 10/12/2022 Arrival Information Details Patient Name: Date of Service: [name] Long, [name] Long 10/12/2022 7:30 A M Medical Record Number: [id number] Patient Account Number: [id number] Date of Birth/Sex: Treating RN: 1940-07-19 (82 y.o. [name] Long, [name] Primary Care : PA Zenovia [place], [place] Other Clinician: Referring : Treating /Extender: Sherryl Manges in Treatment: 5 Visit Information History Since Last Visit Added or deleted any medications: No Patient Arrived: Walker Any new allergies or adverse reactions: No Arrival Time: 08:00 Had a fall or experienced change in No Accompanied By: daughter activities of daily living that may affect Transfer Assistance: None risk of falls: Patient Identification Verified: Yes Signs or symptoms of abuse/neglect since last visito No Secondary Verification Process Completed: Yes Hospitalized since last visit: No Patient Requires Transmission-Based Precautions: No Implantable device outside of the clinic excluding No Patient Has Alerts: No cellular tissue based products placed in the center since last visit: Has Dressing in Place as Prescribed: Yes Has Compression in Place as Prescribed: Yes Pain Present Now: No Electronic Signature(s) Signed: 10/12/2022 12:03:42 PM By: [name] RN, BSN Entered By: [name] on 10/12/2022 08:01:14 -------------------------------------------------------------------------------- Encounter Discharge Information Details Patient Name: Date of Service: [name] Long, [name] Long 10/12/2022 7:30 A M Medical Record Number: [id number] Patient Account Number: [id number] Date of Birth/Sex: Treating RN: [birth year] (82 y.o. [name] : PA Zenovia Jordan, [place] Other Clinician: Referring : Treating /Extender: Sherryl Manges in Treatment: 5 Encounter Discharge Information  Items Discharge Condition: Stable Ambulatory Status: Walker Discharge Destination: Home Transportation: Private Auto Accompanied By: daughter Schedule Follow-up Appointment: Yes Clinical Summary of Care: Patient Declined Electronic Signature(s) Signed: 10/12/2022 12:03:42 PM By: [name] RN, BSN Entered By: [name] on 10/12/2022 08:03:14 [place], [name] ([id number]) [id number] Page 2 of 5 -------------------------------------------------------------------------------- Negative Pressure Wound Therapy Maintenance (NPWT) Details Patient Name: Date of Service: [name] Long, [name] Long 10/12/2022 7:30 A M Medical Record Number: [id number] Patient Account Number: [id number] Date of Birth/Sex: Treating RN: 02/22/1941 (82 y.o. [name] : PA Zenovia [place], [place] Other Clinician: Referring : Treating /Extender: Sherryl Manges in Treatment: 5 NPWT Maintenance Performed for: Wound #1 Left, Lateral Lower Leg Additional Injuries Covered: Yes Additional Injuries: Wound #3 Left, Posterior Lower Leg Performed By: [name], RN Coverage Size (sq cm): 7.5 Pressure Type: Constant Pressure Setting: 125 mmHG Drain Type: None Primary Contact: None Sponge/Dressing Type: Foam- Black Date Initiated: 09/20/2022 Dressing Removed: Yes Quantity of Sponges/Gauze Removed: 2 Canister Changed: Yes Canister Exudate Volume: 30 Dressing Reapplied: Yes Quantity of Sponges/Gauze Inserted: 2 Respones T Treatment: o good Days On NPWT : 23 Electronic Signature(s) Signed: 10/12/2022 12:03:42 PM By: [name] RN, BSN Entered By: [name] on 10/12/2022 08:02:25 -------------------------------------------------------------------------------- Patient/Caregiver Education Details Patient Name: Date of Service: [name] Long, [name] Long 8/8/2024andnbsp7:30 A M Medical Record Number: [id number] Patient Account Number: [id number] Date of  Birth/Gender: Treating RN: 1941-03-05 (82 y.o. [name] Physician: PA Zenovia Jordan, [place] Other Clinician: Referring Physician: Treating Physician/Extender: Sherryl Manges in Treatment: 5 Education Assessment Education Provided To: Patient Education Topics Provided Venous: Methods: Explain/Verbal Responses: Reinforcements needed, State content correctly Electronic Signature(s) Signed: 10/12/2022 12:03:42 PM By: [name] RN, BSN Entered By: [name] on 10/12/2022 08:02:57 [place], [name] ([id number]) [id number] Page 3 of 5 -------------------------------------------------------------------------------- Wound Assessment Details Patient Name: Date of Service: [name] Long, [name] Long 10/12/2022 7:30 A M Medical Record Number: [id number] Patient Account Number: [id number] Date of Birth/Sex: Treating RN: 1940-11-14 (82 y.o. F)  [name] Primary Care : PA Zenovia Jordan, [place] Other Clinician: Referring : Treating /Extender: Sherryl Manges in Treatment: 5 Wound Status Wound Number: 1 Primary Etiology: Diabetic Wound/Ulcer of the Lower Extremity Wound Location: Left, Lateral Lower Leg Wound Status: Open Wounding Event: Hematoma Date Acquired: 08/05/2022 Weeks Of Treatment: 5 Clustered Wound: Yes Wound Measurements Length: (cm) 3 Width: (cm) 1.7 Depth: (cm) 0.8 Area: (cm) 4.006 Volume: (cm) 3.204 % Reduction in Area: 88.7% % Reduction in Volume: 89.9% Wound Description Classification: Grade 2 Exudate Amount: Medium Exudate Type: Serosanguineous Exudate Color: red, brown Periwound Skin Texture Texture Color No Abnormalities Noted: No No Abnormalities Noted: No Moisture No Abnormalities Noted: No Treatment Notes Wound #1 (Lower Leg) Wound Laterality: Left, Lateral Cleanser Soap and Water Discharge Instruction: May shower and wash wound with dial antibacterial soap and water prior to dressing  change. Vashe 5.8 (oz) Discharge Instruction: Cleanse the wound with Vashe prior to applying a clean dressing using gauze sponges, not tissue or cotton balls. Peri-Wound Care Topical Primary Dressing NPWT Secondary Dressing Secured With Compression Wrap Kerlix Roll 4.5x3.1 (in/yd) Discharge Instruction: Apply Kerlix and Coban compression as directed. Coban Self-Adherent Wrap 4x5 (in/yd) Discharge Instruction: Apply over Kerlix as directed. Compression Stockings Add-Ons Electronic Signature(s) Signed: 10/12/2022 12:03:42 PM By: [name] RN, BSN [name] Long, Signed: 10/12/2022 12:03:42 PM By: [name] RN, BSN [name] Long ([id number]) [id number] Page 4 of 5 Entered By: [name] on 10/12/2022 08:01:32 -------------------------------------------------------------------------------- Wound Assessment Details Patient Name: Date of Service: [name] Long, [name] Long 10/12/2022 7:30 A M Medical Record Number: [id number] Patient Account Number: [id number] Date of Birth/Sex: Treating RN: 11-10-1940 (82 y.o. [name] : PA Zenovia Jordan, [place] Other Clinician: Referring : Treating /Extender: Sherryl Manges in Treatment: 5 Wound Status Wound Number: 3 Primary Etiology: Infection - not elsewhere classified Wound Location: Left, Posterior Lower Leg Wound Status: Open Wounding Event: Trauma Date Acquired: 09/20/2022 Weeks Of Treatment: 3 Clustered Wound: No Wound Measurements Length: (cm) 1.2 Width: (cm) 2 Depth: (cm) 0.1 Area: (cm) 1.885 Volume: (cm) 0.188 % Reduction in Area: 56.5% % Reduction in Volume: 56.7% Wound Description Classification: Full Thickness Without Exposed Suppo Exudate Amount: Medium Exudate Type: Serosanguineous Exudate Color: red, brown rt Structures Periwound Skin Texture Texture Color No Abnormalities Noted: No No Abnormalities Noted: No Moisture No Abnormalities Noted: No Treatment  Notes Wound #3 (Lower Leg) Wound Laterality: Left, Posterior Cleanser Soap and Water Discharge Instruction: May shower and wash wound with dial antibacterial soap and water prior to dressing change. Vashe 5.8 (oz) Discharge Instruction: Cleanse the wound with Vashe prior to applying a clean dressing using gauze sponges, not tissue or cotton balls. Peri-Wound Care Topical Primary Dressing NPWT Secondary Dressing Secured With Compression Wrap Kerlix Roll 4.5x3.1 (in/yd) Discharge Instruction: Apply Kerlix and Coban compression as directed. Coban Self-Adherent Wrap 4x5 (in/yd) Discharge Instruction: Apply over Kerlix as directed. Compression Stockings [place], [place] ([id number]) [id number] Page 5 of 5 Add-Ons Electronic Signature(s) Signed: 10/12/2022 12:03:42 PM By: [name] RN, BSN Entered By: [name] on 10/12/2022 08:01:32

## 2022-10-16 ENCOUNTER — Encounter (HOSPITAL_BASED_OUTPATIENT_CLINIC_OR_DEPARTMENT_OTHER): Payer: Medicare Other | Admitting: Internal Medicine

## 2022-10-16 DIAGNOSIS — E11622 Type 2 diabetes mellitus with other skin ulcer: Secondary | ICD-10-CM | POA: Diagnosis not present

## 2022-10-19 ENCOUNTER — Encounter (HOSPITAL_BASED_OUTPATIENT_CLINIC_OR_DEPARTMENT_OTHER): Payer: Medicare Other | Admitting: General Surgery

## 2022-10-19 DIAGNOSIS — E11622 Type 2 diabetes mellitus with other skin ulcer: Secondary | ICD-10-CM | POA: Diagnosis not present

## 2022-10-19 NOTE — Progress Notes (Signed)
[place], [place] ([id number]) [id number] Page 1 of 9 Visit Report for 10/19/2022 Arrival Information Details Patient Name: Date of Service: [name] Long, [name] Long 10/19/2022 7:30 A M Medical Record Number: [id number] Patient Account Number: [id number] Date of Birth/Sex: Treating RN: 1940/09/09 (82 y.o. [name], [name] Primary Care : PA Zenovia Jordan, NO Other Clinician: Referring : Treating /Extender: Sherryl Manges in Treatment: 6 Visit Information History Since Last Visit Added or deleted any medications: No Patient Arrived: Walker Any new allergies or adverse reactions: No Arrival Time: 07:33 Had a fall or experienced change in No Accompanied By: son activities of daily living that may affect Transfer Assistance: None risk of falls: Patient Identification Verified: Yes Signs or symptoms of abuse/neglect since last visito No Secondary Verification Process Completed: Yes Hospitalized since last visit: No Patient Requires Transmission-Based Precautions: No Implantable device outside of the clinic excluding No Patient Has Alerts: No cellular tissue based products placed in the center since last visit: Has Dressing in Place as Prescribed: Yes Has Compression in Place as Prescribed: Yes Pain Present Now: No Electronic Signature(s) Signed: 10/19/2022 4:08:19 PM By: [name] RN, BSN Entered By: [name] on 10/19/2022 07:38:05 -------------------------------------------------------------------------------- Encounter Discharge Information Details Patient Name: Date of Service: [name] Long, [name] Long 10/19/2022 7:30 A M Medical Record Number: [id number] Patient Account Number: [id number] Date of Birth/Sex: Treating RN: Aug 08, 1940 (82 y.o. [name] : PA Zenovia Jordan, [place] Other Clinician: Referring : Treating /Extender: Sherryl Manges in Treatment: 6 Encounter Discharge Information  Items Discharge Condition: Stable Ambulatory Status: Walker Discharge Destination: Home Transportation: Private Auto Accompanied By: son Schedule Follow-up Appointment: Yes Clinical Summary of Care: Patient Declined Electronic Signature(s) Signed: 10/19/2022 4:08:19 PM By: [name] RN, BSN Entered By: [name] on 10/19/2022 08:16:40 [name] Long, [name] Long ([id number]) [id number] Page 2 of 9 -------------------------------------------------------------------------------- Lower Extremity Assessment Details Patient Name: Date of Service: [name] Long, [name] Long 10/19/2022 7:30 A M Medical Record Number: [id number] Patient Account Number: [id number] Date of Birth/Sex: Treating RN: 01-16-1941 (82 y.o. [name] : PA Zenovia Jordan, [place] Other Clinician: Referring : Treating /Extender: Sherryl Manges in Treatment: 6 Edema Assessment Assessed: [Left: No] [Right: No] Edema: [Left: Ye] [Right: s] Calf Left: Right: Point of Measurement: From Medial Instep 32.5 cm Ankle Left: Right: Point of Measurement: From Medial Instep 25 cm Vascular Assessment Pulses: Dorsalis Pedis Palpable: [Left:Yes] Extremity colors, hair growth, and conditions: Extremity Color: [Left:Hyperpigmented] Hair Growth on Extremity: [Left:No] Temperature of Extremity: [Left:Warm < 3 seconds] Electronic Signature(s) Signed: 10/19/2022 4:08:19 PM By: [name] RN, BSN Entered By: [name] on 10/19/2022 07:42:29 -------------------------------------------------------------------------------- Multi Wound Chart Details Patient Name: Date of Service: [name] Long, [name] Long 10/19/2022 7:30 A M Medical Record Number: [id number] Patient Account Number: [id number] Date of Birth/Sex: Treating RN: April 26, 1940 (82 y.o. [name] : PA [place], [place] Other Clinician: Referring : Treating /Extender: Sherryl Manges in  Treatment: 6 Vital Signs Height(in): 64 Pulse(bpm): 62 Weight(lbs): 162 Blood Pressure(mmHg): 147/67 Body Mass Index(BMI): 27.8 Temperature(F): 98.1 Respiratory Rate(breaths/min): 18 [1:Photos:] [N/A:N/A [id number] Page 3 of 9] Left, Lateral Lower Leg Left, Posterior Lower Leg N/A Wound Location: Hematoma Trauma N/A Wounding Event: Diabetic Wound/Ulcer of the Lower Infection - not elsewhere classified N/A Primary Etiology: Extremity Arrhythmia, Congestive Heart Failure, Arrhythmia, Congestive Heart Failure, N/A Comorbid History: Hypertension, Type II Diabetes Hypertension, Type II Diabetes 08/05/2022 09/20/2022 N/A Date Acquired: 6 4 N/A Weeks of Treatment: Open Open N/A Wound Status: No No N/A Wound Recurrence: Yes No N/A Clustered  Wound: 2.5x1.1x0.1 0.8x1.7x0.1 N/A Measurements L x W x D (cm) 2.16 1.068 N/A A (cm) : rea 0.216 0.107 N/A Volume (cm) : 93.90% 75.40% N/A % Reduction in A rea: 99.30% 75.30% N/A % Reduction in Volume: 11 Position 1 (o'clock): 1.5 Maximum Distance 1 (cm): Yes No N/A Tunneling: Grade 2 Full Thickness Without Exposed N/A Classification: Support Structures Medium Medium N/A Exudate Amount: Serosanguineous Serosanguineous N/A Exudate Type: red, brown red, brown N/A Exudate Color: Distinct, outline attached Flat and Intact N/A Wound Margin: Large (67-100%) Large (67-100%) N/A Granulation Amount: Red Red N/A Granulation Quality: Small (1-33%) Small (1-33%) N/A Necrotic Amount: Fat Layer (Subcutaneous Tissue): Yes Fat Layer (Subcutaneous Tissue): Yes N/A Exposed Structures: Fascia: No Fascia: No Tendon: No Tendon: No Muscle: No Muscle: No Joint: No Joint: No Bone: No Bone: No N/A Small (1-33%) N/A Epithelialization: No Abnormalities Noted No Abnormalities Noted N/A Periwound Skin Texture: No Abnormalities Noted No Abnormalities Noted N/A Periwound Skin Moisture: Hemosiderin Staining:  Yes Hemosiderin Staining: Yes N/A Periwound Skin Color: No Abnormality No Abnormality N/A Temperature: Negative Pressure Wound Therapy N/A N/A Procedures Performed: Maintenance (NPWT) Treatment Notes Electronic Signature(s) Signed: 10/19/2022 7:59:51 AM By: Duanne Guess MD FACS Signed: 10/19/2022 4:08:19 PM By: [name] RN, BSN Entered By: Duanne Guess on 10/19/2022 07:59:51 -------------------------------------------------------------------------------- Multi-Disciplinary Care Plan Details Patient Name: Date of Service: [name] Long, [name] Long 10/19/2022 7:30 A M Medical Record Number: [id number] Patient Account Number: [id number] Date of Birth/Sex: Treating RN: [birth year] (82 y.o. [name] : PA Zenovia Jordan, [place] Other Clinician: Referring : Treating /Extender: Sherryl Manges in Treatment: 6 Multidisciplinary Care Plan reviewed with physician Active Inactive Venous Leg Ulcer [place], Donella ([id number]) [id number] Page 4 of 9 Nursing Diagnoses: Knowledge deficit related to disease process and management Potential for venous Insuffiency (use before diagnosis confirmed) Goals: Patient will maintain optimal edema control Date Initiated: 10/19/2022 Target Resolution Date: 11/16/2022 Goal Status: Active Interventions: Assess peripheral edema status every visit. Compression as ordered Treatment Activities: Therapeutic compression applied : 10/19/2022 Notes: Wound/Skin Impairment Nursing Diagnoses: Impaired tissue integrity Goals: Patient/caregiver will verbalize understanding of skin care regimen Date Initiated: 09/06/2022 Target Resolution Date: 11/04/2022 Goal Status: Active Interventions: Assess patient/caregiver ability to obtain necessary supplies Assess patient/caregiver ability to perform ulcer/skin care regimen upon admission and as needed Assess ulceration(s) every visit Provide education on  ulcer and skin care Screen for HBO Treatment Activities: Skin care regimen initiated : 09/06/2022 Topical wound management initiated : 09/06/2022 Notes: Electronic Signature(s) Signed: 10/19/2022 4:08:19 PM By: [name] RN, BSN Entered By: [name] on 10/19/2022 07:52:27 -------------------------------------------------------------------------------- Negative Pressure Wound Therapy Maintenance (NPWT) Details Patient Name: Date of Service: [name] Long, [name] Long 10/19/2022 7:30 A M Medical Record Number: [id number] Patient Account Number: [id number] Date of Birth/Sex: Treating RN: 08/12/1940 (82 y.o. [name] : PA Zenovia Jordan, [place] Other Clinician: Referring : Treating /Extender: Sherryl Manges in Treatment: 6 NPWT Maintenance Performed for: Wound #1 Left, Lateral Lower Leg Additional Injuries Covered: Yes Additional Injuries: Wound #3 Left, Posterior Lower Leg Performed By: [name], RN Coverage Size (sq cm): 4.11 Pressure Type: Constant Pressure Setting: 125 mmHG Drain Type: None Primary Contact: None Sponge/Dressing Type: Foam- Black Date Initiated: 09/20/2022 Dressing Removed: Yes Quantity of Sponges/Gauze Removed: 2 Canister Changed: No Dressing Reapplied: Yes Quantity of Sponges/Gauze InsertedKarie Fetch [place], Diamonique ([id number]) [id number] Page 5 of 9 Respones T Treatment: o good Days On NPWT : 30 Post Procedure Diagnosis Same as Pre-procedure Electronic Signature(s) Signed: 10/19/2022 4:08:19 PM  By: [name] RN, BSN Entered By: [name] on 10/19/2022 07:58:49 -------------------------------------------------------------------------------- Pain Assessment Details Patient Name: Date of Service: [name] Long, [name] Long 10/19/2022 7:30 A M Medical Record Number: [id number] Patient Account Number: [id number] Date of Birth/Sex: Treating RN: May 30, 1940 (82 y.o. [name]  : PA Zenovia Jordan, [place] Other Clinician: Referring : Treating /Extender: Sherryl Manges in Treatment: 6 Active Problems Location of Pain Severity and Description of Pain Patient Has Paino No Site Locations Rate the pain. Current Pain Level: 0 Pain Management and Medication Current Pain Management: Electronic Signature(s) Signed: 10/19/2022 4:08:19 PM By: [name] RN, BSN Entered By: [name] on 10/19/2022 07:38:14 -------------------------------------------------------------------------------- Patient/Caregiver Education Details Patient Name: Date of Service: [name] Long, Luella 8/15/2024andnbsp7:30 A M Medical Record Number: [id number] Patient Account Number: [id number] Date of Birth/Gender: Treating RN: 1941-01-18 (82 y.o. [name] Physician: PA Zenovia Jordan, [place] Other Clinician: Referring Physician: Treating Physician/Extender: Sherryl Manges in Treatment: 6 [place], [place] ([id number]) [id number] Page 6 of 9 Education Assessment Education Provided To: Patient Education Topics Provided Venous: Methods: Explain/Verbal Responses: Reinforcements needed, State content correctly Wound/Skin Impairment: Methods: Explain/Verbal Responses: Reinforcements needed, State content correctly Electronic Signature(s) Signed: 10/19/2022 4:08:19 PM By: [name] RN, BSN Entered By: [name] on 10/19/2022 07:52:46 -------------------------------------------------------------------------------- Wound Assessment Details Patient Name: Date of Service: [name] Long, [name] Long 10/19/2022 7:30 A M Medical Record Number: [id number] Patient Account Number: [id number] Date of Birth/Sex: Treating RN: 03-30-1940 (82 y.o. [name] : PA Zenovia Jordan, [place] Other Clinician: Referring : Treating /Extender: Sherryl Manges in Treatment: 6 Wound Status Wound Number: 1 Primary  Diabetic Wound/Ulcer of the Lower Extremity Etiology: Wound Location: Left, Lateral Lower Leg Wound Status: Open Wounding Event: Hematoma Comorbid Arrhythmia, Congestive Heart Failure, Hypertension, Type II Date Acquired: 08/05/2022 History: Diabetes Weeks Of Treatment: 6 Clustered Wound: Yes Photos Wound Measurements Length: (cm) 2.5 Width: (cm) 1.1 Depth: (cm) 0.1 Area: (cm) 2.16 Volume: (cm) 0.216 % Reduction in Area: 93.9% % Reduction in Volume: 99.3% Tunneling: Yes Position (o'clock): 11 Maximum Distance: (cm) 1.5 Undermining: No Wound Description Classification: Grade 2 Wound Margin: Distinct, outline attached Exudate Amount: Medium [name] Long, [name] Long ([id number]) Exudate Type: Serosanguineous Exudate Color: red, brown Foul Odor After Cleansing: No Slough/Fibrino Yes [phone].pdf Page 7 of 9 Wound Bed Granulation Amount: Large (67-100%) Exposed Structure Granulation Quality: Red Fascia Exposed: No Necrotic Amount: Small (1-33%) Fat Layer (Subcutaneous Tissue) Exposed: Yes Necrotic Quality: Adherent Slough Tendon Exposed: No Muscle Exposed: No Joint Exposed: No Bone Exposed: No Periwound Skin Texture Texture Color No Abnormalities Noted: Yes No Abnormalities Noted: No Hemosiderin Staining: Yes Moisture No Abnormalities Noted: Yes Temperature / Pain Temperature: No Abnormality Treatment Notes Wound #1 (Lower Leg) Wound Laterality: Left, Lateral Cleanser Soap and Water Discharge Instruction: May shower and wash wound with dial antibacterial soap and water prior to dressing change. Vashe 5.8 (oz) Discharge Instruction: Cleanse the wound with Vashe prior to applying a clean dressing using gauze sponges, not tissue or cotton balls. Peri-Wound Care Topical Primary Dressing NPWT Secondary Dressing Secured With Compression Wrap Kerlix Roll 4.5x3.1 (in/yd) Discharge Instruction: Apply Kerlix and Coban compression as directed. Coban  Self-Adherent Wrap 4x5 (in/yd) Discharge Instruction: Apply over Kerlix as directed. Compression Stockings Add-Ons Electronic Signature(s) Signed: 10/19/2022 4:08:19 PM By: [name] RN, BSN Entered By: [name] on 10/19/2022 07:51:01 -------------------------------------------------------------------------------- Wound Assessment Details Patient Name: Date of Service: [name] Long, [name] Long 10/19/2022 7:30 A M Medical Record Number: [id number] Patient Account Number: [id number] Date of  Birth/Sex: Treating RN: [birth year] (82 y.o. [name] : PA Zenovia Jordan, [place] Other Clinician: Referring : Treating /Extender: Sherryl Manges in Treatment: 6 Wound Status Wound Number: 3 Primary Infection - not elsewhere classified Etiology: Wound Location: Left, Posterior Lower Leg Wound Status: Open Wounding Event: Trauma [name] Long, [name] Long ([id number]) [phone].pdf Page 8 of 9 Comorbid Arrhythmia, Congestive Heart Failure, Hypertension, Type II Date Acquired: 09/20/2022 History: Diabetes Weeks Of Treatment: 4 Clustered Wound: No Photos Wound Measurements Length: (cm) 0.8 Width: (cm) 1.7 Depth: (cm) 0.1 Area: (cm) 1.068 Volume: (cm) 0.107 % Reduction in Area: 75.4% % Reduction in Volume: 75.3% Epithelialization: Small (1-33%) Tunneling: No Undermining: No Wound Description Classification: Full Thickness Without Exposed Support Structures Wound Margin: Flat and Intact Exudate Amount: Medium Exudate Type: Serosanguineous Exudate Color: red, brown Foul Odor After Cleansing: No Slough/Fibrino No Wound Bed Granulation Amount: Large (67-100%) Exposed Structure Granulation Quality: Red Fascia Exposed: No Necrotic Amount: Small (1-33%) Fat Layer (Subcutaneous Tissue) Exposed: Yes Tendon Exposed: No Muscle Exposed: No Joint Exposed: No Bone Exposed: No Periwound Skin Texture Texture Color No Abnormalities Noted:  Yes No Abnormalities Noted: No Hemosiderin Staining: Yes Moisture No Abnormalities Noted: Yes Temperature / Pain Temperature: No Abnormality Treatment Notes Wound #3 (Lower Leg) Wound Laterality: Left, Posterior Cleanser Soap and Water Discharge Instruction: May shower and wash wound with dial antibacterial soap and water prior to dressing change. Vashe 5.8 (oz) Discharge Instruction: Cleanse the wound with Vashe prior to applying a clean dressing using gauze sponges, not tissue or cotton balls. Peri-Wound Care Topical Primary Dressing NPWT Secondary Dressing Secured With Compression Wrap Kerlix Roll 4.5x3.1 (in/yd) Discharge Instruction: Apply Kerlix and Coban compression as directed. Coban Self-Adherent Wrap 4x5 (in/yd) Discharge Instruction: Apply over Kerlix as directed. [place], [place] ([id number]) [id number] Page 9 of 9 Compression Stockings Add-Ons Electronic Signature(s) Signed: 10/19/2022 4:08:19 PM By: [name] RN, BSN Entered By: [name] on 10/19/2022 07:51:39 -------------------------------------------------------------------------------- Vitals Details Patient Name: Date of Service: [name] Long, Winry 10/19/2022 7:30 A M Medical Record Number: [id number] Patient Account Number: [id number] Date of Birth/Sex: Treating RN: [birth year] (82 y.o. [name] : PA Zenovia Jordan, [place] Other Clinician: Referring : Treating /Extender: Sherryl Manges in Treatment: 6 Vital Signs Time Taken: 07:38 Temperature (F): 98.1 Height (in): 64 Pulse (bpm): 62 Weight (lbs): 162 Respiratory Rate (breaths/min): 18 Body Mass Index (BMI): 27.8 Blood Pressure (mmHg): 147/67 Reference Range: 80 - 120 mg / dl Electronic Signature(s) Signed: 10/19/2022 4:08:19 PM By: [name] RN, BSN Entered By: [name] on 10/19/2022 07:39:33

## 2022-10-19 NOTE — Progress Notes (Addendum)
[place], [place] ([id number]) [id number] Page 1 of 8 Visit Report for 10/19/2022 Chief Complaint Document Details Patient Name: Date of Service: [name] Long, [name] Long 10/19/2022 7:30 A M Medical Record Number: [id number] Patient Account Number: [id number] Date of Birth/Sex: Treating RN: 1940/08/12 (82 y.o. [name] Provider: PA Zenovia Jordan, [place] Other Clinician: Referring Provider: Treating Provider/Extender: Sherryl Manges in Treatment: 6 Information Obtained from: Patient Chief Complaint Patient seen for complaints of Non-Healing Wound. Electronic Signature(s) Signed: 10/19/2022 8:00:02 AM By: Duanne Guess MD FACS Entered By: Duanne Guess on 10/19/2022 08:00:02 -------------------------------------------------------------------------------- HPI Details Patient Name: Date of Service: [name] Long, [name] Long 10/19/2022 7:30 A M Medical Record Number: [id number] Patient Account Number: [id number] Date of Birth/Sex: Treating RN: 1940/10/03 (82 y.o. [name] Provider: PA Zenovia Jordan, [place] Other Clinician: Referring Provider: Treating Provider/Extender: Sherryl Manges in Treatment: 6 History of Present Illness HPI Description: ADMISSION 09/06/2022 This is an 82 year old type II diabetic (no A1c to review and patient is unaware of her last value) with a history of atrial fibrillation on chronic anticoagulant therapy, lower extremity edema, congestive heart failure, and stage III kidney disease. She divides her time between her home in [place] and here in [place] where her daughter lives. While in [place], she struck her leg on her bed frame and developed a large hematoma. She was evaluated in the emergency department there and imaging was negative for any deeper injury, such as a fracture. She returned to [facility] and has had an urgent care and emergency department visit to evaluate the site. As there had been no significant  improvement in her wound, she was referred to the wound care center for further evaluation and management. I am unable to see any evidence of an attempt to evacuate the hematoma in the electronic medical record.. There are 2 openings in her skin, but they connect to each other with significant tunneling and undermining. I am unsure if these opened spontaneously or there was a non- documented attempt to drain the hematoma site. The majority of her left lower leg from below the tibial tuberosity to just above the ankle and from the anterior tibia wrapping laterally and posteriorly to nearly the medial aspect of her leg is involved. There is a foul odor coming from her wound. There is no frank pus but there is significant retained clot that has likely served as culture media. ABI in clinic today was 0.84. 09/08/2022: The patient came in today as an add-on due to bloody drainage that was saturating her dressing. On inspection, it appeared that there was no frank fresh blood and no active site of bleeding identified; it appears that more of the hematoma was liquefied and draining from her wound. 09/13/2022: The wound has cleaned up considerably. There is no longer any foul odor and the drainage has decreased significantly. The patient is also complaining of less pain. There is still some nonviable subcutaneous tissue and fat present but overall everything is improved from the last visit. Her culture grew out a polymicrobial population of multiple drug-resistant organisms. She is currently taking Augmentin and levofloxacin as suggested by the culture data. 09/20/2022: The cavity is contracting nicely. There is no longer a connection between the posterior wound and the lateral leg wound. The posterior wound has a little bit of slough on the surface but the larger main wound is very clean. She has been approved for a wound VAC and we will apply that today. 09/25/2022: Post Wound sloughly- alginate Ag.  Vac on lateral  wound; decreased undermining. 10/02/2022: There is a little bit of slough on the posterior wound. The lateral leg wound continues to contract at a remarkable rate. It is clean without any slough or other debris accumulation. 10/09/2022: The posterior wound is about half the size as it was last week. The lateral leg wound has contracted remarkably. There is still some undermining from 9-12 o'clock. Everything is very clean. [place], [place] ([id number]) [id number] Page 2 of 8 10/19/2022: Both wounds have contracted considerably. Once again, the posterior wound is about half the size as it was last week. The tunnel is down to about a centimeter. Everything is extremely clean. Edema control is good. Electronic Signature(s) Signed: 10/19/2022 8:01:04 AM By: Duanne Guess MD FACS Entered By: Duanne Guess on 10/19/2022 08:01:04 -------------------------------------------------------------------------------- Physical Exam Details Patient Name: Date of Service: [name] Long, [name] Long 10/19/2022 7:30 A M Medical Record Number: [id number] Patient Account Number: [id number] Date of Birth/Sex: Treating RN: 10-11-1940 (82 y.o. [name] Provider: PA Zenovia Jordan, [place] Other Clinician: Referring Provider: Treating Provider/Extender: Sherryl Manges in Treatment: 6 Constitutional Slightly hypertensive. . . . no acute distress. Respiratory Normal work of breathing on room air. Notes 10/19/2022: Both wounds have contracted considerably. Once again, the posterior wound is about half the size as it was last week. The tunnel is down to about a centimeter. Everything is extremely clean. Edema control is good. Electronic Signature(s) Signed: 10/19/2022 8:01:38 AM By: Duanne Guess MD FACS Entered By: Duanne Guess on 10/19/2022 08:01:38 -------------------------------------------------------------------------------- Physician Orders Details Patient Name: Date of  Service: [name] Long, [name] Long 10/19/2022 7:30 A M Medical Record Number: [id number] Patient Account Number: [id number] Date of Birth/Sex: Treating RN: Jul 07, 1940 (82 y.o. [name] Provider: PA Zenovia Jordan, [place] Other Clinician: Referring Provider: Treating Provider/Extender: Sherryl Manges in Treatment: 6 Verbal / Phone Orders: No Diagnosis Coding ICD-10 Coding Code Description [phone] 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 Z79.01 Long term (current) use of anticoagulants I50.42 Chronic combined systolic (congestive) and diastolic (congestive) heart failure N18.30 Chronic kidney disease, stage 3 unspecified Follow-up Appointments ppointment in 1 week. - Dr. Lady Gary - RM 1 Return A Monday 8/19 at 08:15 AM Nurse Visit: - RM 1 Thurs 8/22 @ 07:30 am Anesthetic [place], [name] ([id number]) [id number] Page 3 of 8 (In clinic) Topical Lidocaine 4% applied to wound bed Bathing/ Shower/ Hygiene May shower with protection but do not get wound dressing(s) wet. Protect dressing(s) with water repellant cover (for example, large plastic bag) or a cast cover and may then take shower. Negative Presssure Wound Therapy Medela Wound Vac continuously at 131mm/hg - bridge wound 1 and 2 together Black Foam Edema Control - Lymphedema / SCD / Other Elevate legs to the level of the heart or above for 30 minutes daily and/or when sitting for 3-4 times a day throughout the day. Avoid standing for long periods of time. Exercise regularly Wound Treatment Wound #1 - Lower Leg Wound Laterality: Left, Lateral Cleanser: Soap and Water 2 x Per Week/30 Days Discharge Instructions: May shower and wash wound with dial antibacterial soap and water prior to dressing change. Cleanser: Vashe 5.8 (oz) 2 x Per Week/30 Days Discharge Instructions: Cleanse the wound with Vashe prior to  applying a clean dressing using gauze sponges, not tissue or cotton balls. Prim Dressing: NPWT ary 2 x Per Week/30 Days Compression Wrap: Kerlix Roll 4.5x3.1 (in/yd)  2 x Per Week/30 Days Discharge Instructions: Apply Kerlix and Coban compression as directed. Compression Wrap: Coban Self-Adherent Wrap 4x5 (in/yd) 2 x Per Week/30 Days Discharge Instructions: Apply over Kerlix as directed. Wound #3 - Lower Leg Wound Laterality: Left, Posterior Cleanser: Soap and Water 2 x Per Week/30 Days Discharge Instructions: May shower and wash wound with dial antibacterial soap and water prior to dressing change. Cleanser: Vashe 5.8 (oz) 2 x Per Week/30 Days Discharge Instructions: Cleanse the wound with Vashe prior to applying a clean dressing using gauze sponges, not tissue or cotton balls. Prim Dressing: NPWT ary 2 x Per Week/30 Days Compression Wrap: Kerlix Roll 4.5x3.1 (in/yd) 2 x Per Week/30 Days Discharge Instructions: Apply Kerlix and Coban compression as directed. Compression Wrap: Coban Self-Adherent Wrap 4x5 (in/yd) 2 x Per Week/30 Days Discharge Instructions: Apply over Kerlix as directed. Electronic Signature(s) Signed: 10/19/2022 10:16:35 AM By: Duanne Guess MD FACS Entered By: Duanne Guess on 10/19/2022 08:02:51 -------------------------------------------------------------------------------- Problem List Details Patient Name: Date of Service: [name] Long, [name] Long 10/19/2022 7:30 A M Medical Record Number: [id number] Patient Account Number: [id number] Date of Birth/Sex: Treating RN: Dec 31, 1940 (82 y.o. [name] Provider: PA Zenovia Jordan, [place] Other Clinician: Referring Provider: Treating Provider/Extender: Sherryl Manges in Treatment: 6 Active Problems ICD-10 Encounter Code Description Active Date MDM Diagnosis L97.822 Non-pressure chronic ulcer of other part of left lower leg with fat layer exposed7/05/2022 No Yes [name] Long, [name] Long ([id number])  [id number] Page 4 of 8 E11.622 Type 2 diabetes mellitus with other skin ulcer 09/06/2022 No Yes Z79.01 Long term (current) use of anticoagulants 09/06/2022 No Yes I50.42 Chronic combined systolic (congestive) and diastolic (congestive) heart failure 09/06/2022 No Yes N18.30 Chronic kidney disease, stage 3 unspecified 09/06/2022 No Yes Inactive Problems Resolved Problems Electronic Signature(s) Signed: 10/19/2022 7:59:32 AM By: Duanne Guess MD FACS Entered By: Duanne Guess on 10/19/2022 07:59:32 -------------------------------------------------------------------------------- Progress Note Details Patient Name: Date of Service: [name] Long, [name] Long 10/19/2022 7:30 A M Medical Record Number: [id number] Patient Account Number: [id number] Date of Birth/Sex: Treating RN: 04/18/1940 (82 y.o. [name] Provider: PA Zenovia Jordan, [place] Other Clinician: Referring Provider: Treating Provider/Extender: Sherryl Manges in Treatment: 6 Subjective Chief Complaint Information obtained from Patient Patient seen for complaints of Non-Healing Wound. History of Present Illness (HPI) ADMISSION 09/06/2022 This is an 82 year old type II diabetic (no A1c to review and patient is unaware of her last value) with a history of atrial fibrillation on chronic anticoagulant therapy, lower extremity edema, congestive heart failure, and stage III kidney disease. She divides her time between her home in [place] and here in [place] where her daughter lives. While in [place], she struck her leg on her bed frame and developed a large hematoma. She was evaluated in the emergency department there and imaging was negative for any deeper injury, such as a fracture. She returned to [facility] and has had an urgent care and emergency department visit to evaluate the site. As there had been no significant improvement in her wound, she was referred to the wound care center for further  evaluation and management. I am unable to see any evidence of an attempt to evacuate the hematoma in the electronic medical record.. There are 2 openings in her skin, but they connect to each other with significant tunneling and undermining. I am unsure if these opened spontaneously or there was a non- documented attempt to drain the hematoma site. The majority of her left lower leg from below the tibial tuberosity to  just above the ankle and from the anterior tibia wrapping laterally and posteriorly to nearly the medial aspect of her leg is involved. There is a foul odor coming from her wound. There is no frank pus but there is significant retained clot that has likely served as culture media. ABI in clinic today was 0.84. 09/08/2022: The patient came in today as an add-on due to bloody drainage that was saturating her dressing. On inspection, it appeared that there was no frank fresh blood and no active site of bleeding identified; it appears that more of the hematoma was liquefied and draining from her wound. 09/13/2022: The wound has cleaned up considerably. There is no longer any foul odor and the drainage has decreased significantly. The patient is also complaining of less pain. There is still some nonviable subcutaneous tissue and fat present but overall everything is improved from the last visit. Her culture grew out a polymicrobial population of multiple drug-resistant organisms. She is currently taking Augmentin and levofloxacin as suggested by the culture data. 09/20/2022: The cavity is contracting nicely. There is no longer a connection between the posterior wound and the lateral leg wound. The posterior wound has a little bit of slough on the surface but the larger main wound is very clean. She has been approved for a wound VAC and we will apply that today. 09/25/2022: Post Wound sloughly- alginate Ag. Vac on lateral wound; decreased undermining. 10/02/2022: There is a little bit of slough on the  posterior wound. The lateral leg wound continues to contract at a remarkable rate. It is clean without any slough or other debris accumulation. [place], [place] ([id number]) [id number] Page 5 of 8 10/09/2022: The posterior wound is about half the size as it was last week. The lateral leg wound has contracted remarkably. There is still some undermining from 9-12 o'clock. Everything is very clean. 10/19/2022: Both wounds have contracted considerably. Once again, the posterior wound is about half the size as it was last week. The tunnel is down to about a centimeter. Everything is extremely clean. Edema control is good. Patient History Information obtained from Patient. Family History No family history of Hereditary Spherocytosis, Hypertension, Kidney Disease, Lung Disease, Seizures, Stroke, Thyroid Problems, Tuberculosis. Social History Never smoker, Marital Status - Widowed, Alcohol Use - Never, Drug Use - No History, Caffeine Use - Never. Medical History Cardiovascular Patient has history of Arrhythmia - A fib, Congestive Heart Failure, Hypertension Endocrine Patient has history of Type II Diabetes - since 2019 Medical A Surgical History Notes nd Cardiovascular High Cholesterol Genitourinary Kidney Disease Objective Constitutional Slightly hypertensive. no acute distress. Vitals Time Taken: 7:38 AM, Height: 64 in, Weight: 162 lbs, BMI: 27.8, Temperature: 98.1 F, Pulse: 62 bpm, Respiratory Rate: 18 breaths/min, Blood Pressure: 147/67 mmHg. Respiratory Normal work of breathing on room air. General Notes: 10/19/2022: Both wounds have contracted considerably. Once again, the posterior wound is about half the size as it was last week. The tunnel is down to about a centimeter. Everything is extremely clean. Edema control is good. Integumentary (Hair, Skin) Wound #1 status is Open. Original cause of wound was Hematoma. The date acquired was: 08/05/2022. The wound has been  in treatment 6 weeks. The wound is located on the Left,Lateral Lower Leg. The wound measures 2.5cm length x 1.1cm width x 0.1cm depth; 2.16cm^2 area and 0.216cm^3 volume. There is Fat Layer (Subcutaneous Tissue) exposed. There is no undermining noted, however, there is tunneling at 11:00 with a maximum distance of 1.5cm. There is a  medium amount of serosanguineous drainage noted. The wound margin is distinct with the outline attached to the wound base. There is large (67-100%) red granulation within the wound bed. There is a small (1-33%) amount of necrotic tissue within the wound bed including Adherent Slough. The periwound skin appearance had no abnormalities noted for texture. The periwound skin appearance had no abnormalities noted for moisture. The periwound skin appearance exhibited: Hemosiderin Staining. Periwound temperature was noted as No Abnormality. Wound #3 status is Open. Original cause of wound was Trauma. The date acquired was: 09/20/2022. The wound has been in treatment 4 weeks. The wound is located on the Left,Posterior Lower Leg. The wound measures 0.8cm length x 1.7cm width x 0.1cm depth; 1.068cm^2 area and 0.107cm^3 volume. There is Fat Layer (Subcutaneous Tissue) exposed. There is no tunneling or undermining noted. There is a medium amount of serosanguineous drainage noted. The wound margin is flat and intact. There is large (67-100%) red granulation within the wound bed. There is a small (1-33%) amount of necrotic tissue within the wound bed. The periwound skin appearance had no abnormalities noted for texture. The periwound skin appearance had no abnormalities noted for moisture. The periwound skin appearance exhibited: Hemosiderin Staining. Periwound temperature was noted as No Abnormality. Assessment Active Problems ICD-10 Non-pressure chronic ulcer of other part of left lower leg with fat layer exposed Type 2 diabetes mellitus with other skin ulcer Long term (current) use  of anticoagulants Chronic combined systolic (congestive) and diastolic (congestive) heart failure Chronic kidney disease, stage 3 unspecified [name] Long, [name] Long ([id number]) [id number] Page 6 of 8 Plan Follow-up Appointments: Return Appointment in 1 week. - Dr. Lady Gary - RM 1 Monday 8/19 at 08:15 AM Nurse Visit: - RM 1 Thurs 8/22 @ 07:30 am Anesthetic: (In clinic) Topical Lidocaine 4% applied to wound bed Bathing/ Shower/ Hygiene: May shower with protection but do not get wound dressing(s) wet. Protect dressing(s) with water repellant cover (for example, large plastic bag) or a cast cover and may then take shower. Negative Presssure Wound Therapy: Medela Wound Vac continuously at 161mm/hg - bridge wound 1 and 2 together Black Foam Edema Control - Lymphedema / SCD / Other: Elevate legs to the level of the heart or above for 30 minutes daily and/or when sitting for 3-4 times a day throughout the day. Avoid standing for long periods of time. Exercise regularly WOUND #1: - Lower Leg Wound Laterality: Left, Lateral Cleanser: Soap and Water 2 x Per Week/30 Days Discharge Instructions: May shower and wash wound with dial antibacterial soap and water prior to dressing change. Cleanser: Vashe 5.8 (oz) 2 x Per Week/30 Days Discharge Instructions: Cleanse the wound with Vashe prior to applying a clean dressing using gauze sponges, not tissue or cotton balls. Prim Dressing: NPWT 2 x Per Week/30 Days ary Com pression Wrap: Kerlix Roll 4.5x3.1 (in/yd) 2 x Per Week/30 Days Discharge Instructions: Apply Kerlix and Coban compression as directed. Com pression Wrap: Coban Self-Adherent Wrap 4x5 (in/yd) 2 x Per Week/30 Days Discharge Instructions: Apply over Kerlix as directed. WOUND #3: - Lower Leg Wound Laterality: Left, Posterior Cleanser: Soap and Water 2 x Per Week/30 Days Discharge Instructions: May shower and wash wound with dial antibacterial soap and water prior to dressing  change. Cleanser: Vashe 5.8 (oz) 2 x Per Week/30 Days Discharge Instructions: Cleanse the wound with Vashe prior to applying a clean dressing using gauze sponges, not tissue or cotton balls. Prim Dressing: NPWT 2 x Per Week/30 Days ary Com pression Wrap: Kerlix Roll  4.5x3.1 (in/yd) 2 x Per Week/30 Days Discharge Instructions: Apply Kerlix and Coban compression as directed. Com pression Wrap: Coban Self-Adherent Wrap 4x5 (in/yd) 2 x Per Week/30 Days Discharge Instructions: Apply over Kerlix as directed. 10/19/2022: Both wounds have contracted considerably. Once again, the posterior wound is about half the size as it was last week. The tunnel is down to about a centimeter. Everything is extremely clean. Edema control is good. No debridement was necessary today. We will continue negative pressure wound therapy and Kerlix and Coban wrap. Follow-up in 1 week. Electronic Signature(s) Signed: 10/19/2022 8:03:47 AM By: Duanne Guess MD FACS Entered By: Duanne Guess on 10/19/2022 08:03:46 -------------------------------------------------------------------------------- HxROS Details Patient Name: Date of Service: [name] Long, [name] Long 10/19/2022 7:30 A M Medical Record Number: [id number] Patient Account Number: [id number] Date of Birth/Sex: Treating RN: August 14, 1940 (82 y.o. [name] Provider: PA Zenovia Jordan, [place] Other Clinician: Referring Provider: Treating Provider/Extender: Sherryl Manges in Treatment: 6 Information Obtained From Patient Cardiovascular Medical History: Positive for: Arrhythmia - A fib; Congestive Heart Failure; Hypertension Past Medical History Notes: High Cholesterol Endocrine Medical History: Positive for: Type II Diabetes - since 2019 [name] Long, [name] Long ([id number]) [id number] Page 7 of 8 Genitourinary Medical History: Past Medical History Notes: Kidney Disease Immunizations Pneumococcal Vaccine: Received Pneumococcal  Vaccination: No Implantable Devices No devices added Family and Social History Hereditary Spherocytosis: No; Hypertension: No; Kidney Disease: No; Lung Disease: No; Seizures: No; Stroke: No; Thyroid Problems: No; Tuberculosis: No; Never smoker; Marital Status - Widowed; Alcohol Use: Never; Drug Use: No History; Caffeine Use: Never; Financial Concerns: No; Food, Clothing or Shelter Needs: No; Support System Lacking: No; Transportation Concerns: No Electronic Signature(s) Signed: 10/19/2022 10:16:35 AM By: Duanne Guess MD FACS Signed: 10/19/2022 4:08:19 PM By: [name] RN, BSN Entered By: Duanne Guess on 10/19/2022 08:01:16 -------------------------------------------------------------------------------- SuperBill Details Patient Name: Date of Service: [name] Long, [name] Long 10/19/2022 Medical Record Number: [id number] Patient Account Number: [id number] Date of Birth/Sex: Treating RN: Oct 26, 1940 (82 y.o. [name] Provider: PA Zenovia Jordan, [place] Other Clinician: Referring Provider: Treating Provider/Extender: Sherryl Manges in Treatment: 6 Diagnosis Coding ICD-10 Codes Code Description [phone] 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 Z79.01 Long term (current) use of anticoagulants I50.42 Chronic combined systolic (congestive) and diastolic (congestive) heart failure N18.30 Chronic kidney disease, stage 3 unspecified Facility Procedures : CPT4 Code: [id number] Description: 03474 - WOUND VAC-50 SQ CM OR LESS Modifier: Quantity: 1 Physician Procedures : CPT4 Code Description Modifier [id number] - WC PHYS LEVEL 4 - EST PT ICD-10 Diagnosis Description L97.822 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 Z79.01  Long term (current) use of anticoagulants I50.42 Chronic combined  systolic (congestive) and diastolic (congestive) heart failure Quantity: 1 Electronic Signature(s) Signed: 10/19/2022 8:04:03 AM By: Duanne Guess MD FACS [name] Long, [name] Long ([id number]) [id number] Page 8 of 8 Entered By: Duanne Guess on 10/19/2022 08:04:03

## 2022-10-20 NOTE — Progress Notes (Signed)
[place], [place] ([id number]) [id number] Page 1 of 4 Visit Report for 10/16/2022 Arrival Information Details Patient Name: Date of Service: [name] Long, [name] Long 10/16/2022 9:00 A M Medical Record Number: [id number] Patient Account Number: [id number] Date of Birth/Sex: Treating RN: 1941/01/17 (82 y.o. F) Primary Care Gustavo Dispenza: PA TIENT, NO Other Clinician: Referring Elveta Rape: Treating Erikson Danzy/Extender: Tilda Franco in Treatment: 5 Visit Information History Since Last Visit Added or deleted any medications: No Patient Arrived: Walker Any new allergies or adverse reactions: No Arrival Time: 08:45 Had a fall or experienced change in No Accompanied By: DAUGHTER activities of daily living that may affect Transfer Assistance: None risk of falls: Patient Identification Verified: Yes Signs or symptoms of abuse/neglect since last visito No Secondary Verification Process Completed: Yes Hospitalized since last visit: No Patient Requires Transmission-Based Precautions: No Implantable device outside of the clinic excluding No Patient Has Alerts: No cellular tissue based products placed in the center since last visit: Has Dressing in Place as Prescribed: Yes Has Compression in Place as Prescribed: Yes Pain Present Now: No Electronic Signature(s) Signed: 10/20/2022 12:35:38 PM By: Thayer Dallas Entered By: Thayer Dallas on 10/16/2022 10:02:50 -------------------------------------------------------------------------------- Encounter Discharge Information Details Patient Name: Date of Service: [name] Long, [name] Long 10/16/2022 9:00 A M Medical Record Number: [id number] Patient Account Number: [id number] Date of Birth/Sex: Treating RN: May 04, 1940 (82 y.o. F) Primary Care Javaeh Muscatello: PA Zenovia Jordan, NO Other Clinician: Thayer Dallas Referring Milad Bublitz: Treating Amesha Bailey/Extender: Tilda Franco in Treatment: 5 Encounter Discharge Information Items Discharge Condition:  Stable Ambulatory Status: Walker Discharge Destination: Home Transportation: Private Auto Accompanied By: self Schedule Follow-up Appointment: Yes Clinical Summary of Care: Electronic Signature(s) Signed: 10/20/2022 12:35:38 PM By: Thayer Dallas Entered By: Thayer Dallas on 10/16/2022 10:05:36 [place], [name] ([id number]) [id number] Page 2 of 4 -------------------------------------------------------------------------------- Negative Pressure Wound Therapy Maintenance (NPWT) Details Patient Name: Date of Service: [name] Long, [name] Long 10/16/2022 9:00 A M Medical Record Number: [id number] Patient Account Number: [id number] Date of Birth/Sex: Treating RN: [birth year] (82 y.o. F) Primary Care Mayerli Kirst: PA Zenovia Jordan, NO Other Clinician: Referring Alexsus Papadopoulos: Treating Stephany Poorman/Extender: Tilda Franco in Treatment: 5 NPWT Maintenance Performed for: Wound #1 Left, Lateral Lower Leg Additional Injuries Covered: Yes Additional Injuries: Wound #3 Left, Posterior Lower Leg Performed By: Thayer Dallas, Type: VAC System Coverage Size (sq cm): 7.5 Pressure Type: Constant Pressure Setting: 125 mmHG Drain Type: None Primary Contact: None Sponge/Dressing Type: Foam- Black Date Initiated: 09/20/2022 Dressing Removed: Yes Quantity of Sponges/Gauze Removed: 1 Canister Changed: No Dressing Reapplied: Yes Quantity of Sponges/Gauze Inserted: 1 Respones T Treatment: o tolerated well Days On NPWT : 27 Electronic Signature(s) Signed: 10/20/2022 12:35:38 PM By: Thayer Dallas Entered By: Thayer Dallas on 10/16/2022 15:03:02 -------------------------------------------------------------------------------- Patient/Caregiver Education Details Patient Name: Date of Service: [name] Long, [name] Long 8/12/2024andnbsp9:00 A M Medical Record Number: [id number] Patient Account Number: [id number] Date of Birth/Gender: Treating RN: 05/20/1940 (82 y.o. F) Primary Care Physician: PA Zenovia Jordan, NO Other  Clinician: Thayer Dallas Referring Physician: Treating Physician/Extender: Tilda Franco in Treatment: 5 Education Assessment Education Provided To: Patient Education Topics Provided Electronic Signature(s) Signed: 10/20/2022 12:35:38 PM By: Thayer Dallas Entered By: Thayer Dallas on 10/16/2022 10:05:17 Antony Odea ([id number]) [id number] Page 3 of 4 -------------------------------------------------------------------------------- Wound Assessment Details Patient Name: Date of Service: [name] Long, [name] Long 10/16/2022 9:00 A M Medical Record Number: [id number] Patient Account Number: [id number] Date of Birth/Sex: Treating RN: 10/30/1940 (82 y.o. F) Primary Care Juanluis Guastella: PA Zenovia Jordan, NO Other Clinician: Referring Benz Vandenberghe: Treating Ravleen Ries/Extender: Tilda Franco in Treatment: 5 Wound Status Wound Number: 1 Primary  Etiology: Diabetic Wound/Ulcer of the Lower Extremity Wound Location: Left, Lateral Lower Leg Wound Status: Open Wounding Event: Hematoma Date Acquired: 08/05/2022 Weeks Of Treatment: 5 Clustered Wound: Yes Wound Measurements Length: (cm) 3 Width: (cm) 1.7 Depth: (cm) 0.8 Area: (cm) 4.006 Volume: (cm) 3.204 % Reduction in Area: 88.7% % Reduction in Volume: 89.9% Wound Description Classification: Grade 2 Exudate Amount: Medium Exudate Type: Serosanguineous Exudate Color: red, brown Periwound Skin Texture Texture Color No Abnormalities Noted: No No Abnormalities Noted: No Moisture No Abnormalities Noted: No Electronic Signature(s) Signed: 10/20/2022 12:35:38 PM By: Thayer Dallas Entered By: Thayer Dallas on 10/16/2022 10:03:33 -------------------------------------------------------------------------------- Wound Assessment Details Patient Name: Date of Service: [name] Long, [name] Long 10/16/2022 9:00 A M Medical Record Number: [id number] Patient Account Number: [id number] Date of Birth/Sex: Treating RN: 26-Mar-1940 (82 y.o.  F) Primary Care Bronsyn Shappell: PA Zenovia Jordan, NO Other Clinician: Referring Abid Bolla: Treating Demtrius Rounds/Extender: Tilda Franco in Treatment: 5 Wound Status Wound Number: 3 Primary Etiology: Infection - not elsewhere classified Wound Location: Left, Posterior Lower Leg Wound Status: Open Wounding Event: Trauma Date Acquired: 09/20/2022 Weeks Of Treatment: 3 Clustered Wound: No Wound Measurements Length: (cm) 1.2 Gearing, Rea ([id number]) Width: (cm) 2 Depth: (cm) 0.1 Area: (cm) 1.885 Volume: (cm) 0.188 % Reduction in Area: 56.5% [id number] Page 4 of 4 % Reduction in Volume: 56.7% Wound Description Classification: Full Thickness Without Exposed Support Exudate Amount: Medium Exudate Type: Serosanguineous Exudate Color: red, brown Structures Periwound Skin Texture Texture Color No Abnormalities Noted: No No Abnormalities Noted: No Moisture No Abnormalities Noted: No Electronic Signature(s) Signed: 10/20/2022 12:35:38 PM By: Thayer Dallas Entered By: Thayer Dallas on 10/16/2022 10:03:33 -------------------------------------------------------------------------------- Vitals Details Patient Name: Date of Service: [name] Long, [name] Long 10/16/2022 9:00 A M Medical Record Number: [id number] Patient Account Number: [id number] Date of Birth/Sex: Treating RN: [birth year] (82 y.o. F) Primary Care Souleymane Saiki: PA TIENT, NO Other Clinician: Referring Agustin Swatek: Treating Kaylum Shrum/Extender: Tilda Franco in Treatment: 5 Vital Signs Time Taken: 09:15 Reference Range: 80 - 120 mg / dl Height (in): 64 Weight (lbs): 162 Body Mass Index (BMI): 27.8 Electronic Signature(s) Signed: 10/20/2022 12:35:38 PM By: Thayer Dallas Entered By: Thayer Dallas on 10/16/2022 10:03:17

## 2022-10-20 NOTE — Progress Notes (Signed)
[place], [place] ([id number]) [id number] Page 1 of 1 Visit Report for 10/16/2022 SuperBill Details Patient Name: Date of Service: [name] Long, [name] Long 10/16/2022 Medical Record Number: [id number] Patient Account Number: [id number] Date of Birth/Sex: Treating RN: 1940/03/30 (82 y.o. 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

## 2022-10-23 ENCOUNTER — Encounter (HOSPITAL_BASED_OUTPATIENT_CLINIC_OR_DEPARTMENT_OTHER): Payer: Medicare Other | Admitting: General Surgery

## 2022-10-23 DIAGNOSIS — E11622 Type 2 diabetes mellitus with other skin ulcer: Secondary | ICD-10-CM | POA: Diagnosis not present

## 2022-10-23 NOTE — Progress Notes (Signed)
[place], [place] ([id number]) [id number] Page 1 of 8 Visit Report for 10/23/2022 Chief Complaint Document Details Patient Name: Date of Service: [name] Long 10/23/2022 8:15 A M Medical Record Number: [id number] Patient Account Number: [id number] Date of Birth/Sex: Treating RN: 10-27-1940 (82 y.o. F) Primary Care Provider: PA Zenovia Jordan, NO Other Clinician: Referring Provider: Treating Provider/Extender: Sherryl Manges in Treatment: 6 Information Obtained from: Patient Chief Complaint Patient seen for complaints of Non-Healing Wound. Electronic Signature(s) Signed: 10/23/2022 9:13:09 AM By: Duanne Guess MD FACS Entered By: Duanne Guess on 10/23/2022 06:13:09 -------------------------------------------------------------------------------- HPI Details Patient Name: Date of Service: [name] Long, [name] Long 10/23/2022 8:15 A M Medical Record Number: [id number] Patient Account Number: [id number] Date of Birth/Sex: Treating RN: 04/28/1940 (82 y.o. F) Primary Care Provider: PA TIENT, NO Other Clinician: Referring Provider: Treating Provider/Extender: Sherryl Manges in Treatment: 6 History of Present Illness HPI Description: ADMISSION 09/06/2022 This is an 82 year old type II diabetic (no A1c to review and patient is unaware of her last value) with a history of atrial fibrillation on chronic anticoagulant therapy, lower extremity edema, congestive heart failure, and stage III kidney disease. She divides her time between her home in [place]. [place] and here in [place] where her daughter lives. While in [place], she struck her leg on her bed frame and developed a large hematoma. She was evaluated in the emergency department there and imaging was negative for any deeper injury, such as a fracture. She returned to [facility] and has had an urgent care and emergency department visit to evaluate the site. As there had been no significant improvement in her wound, she was  referred to the wound care center for further evaluation and management. I am unable to see any evidence of an attempt to evacuate the hematoma in the electronic medical record.. There are 2 openings in her skin, but they connect to each other with significant tunneling and undermining. I am unsure if these opened spontaneously or there was a non- documented attempt to drain the hematoma site. The majority of her left lower leg from below the tibial tuberosity to just above the ankle and from the anterior tibia wrapping laterally and posteriorly to nearly the medial aspect of her leg is involved. There is a foul odor coming from her wound. There is no frank pus but there is significant retained clot that has likely served as culture media. ABI in clinic today was 0.84. 09/08/2022: The patient came in today as an add-on due to bloody drainage that was saturating her dressing. On inspection, it appeared that there was no frank fresh blood and no active site of bleeding identified; it appears that more of the hematoma was liquefied and draining from her wound. 09/13/2022: The wound has cleaned up considerably. There is no longer any foul odor and the drainage has decreased significantly. The patient is also complaining of less pain. There is still some nonviable subcutaneous tissue and fat present but overall everything is improved from the last visit. Her culture grew out a polymicrobial population of multiple drug-resistant organisms. She is currently taking Augmentin and levofloxacin as suggested by the culture data. 09/20/2022: The cavity is contracting nicely. There is no longer a connection between the posterior wound and the lateral leg wound. The posterior wound has a little bit of slough on the surface but the larger main wound is very clean. She has been approved for a wound VAC and we will apply that today. 09/25/2022: Post Wound sloughly- alginate Ag. Vac on lateral wound;  decreased  undermining. 10/02/2022: There is a little bit of slough on the posterior wound. The lateral leg wound continues to contract at a remarkable rate. It is clean without any slough or other debris accumulation. 10/09/2022: The posterior wound is about half the size as it was last week. The lateral leg wound has contracted remarkably. There is still some undermining from 9-12 o'clock. Everything is very clean. [place], [place] ([id number]) [id number] Page 2 of 8 10/19/2022: Both wounds have contracted considerably. Once again, the posterior wound is about half the size as it was last week. The tunnel is down to about a centimeter. Everything is extremely clean. Edema control is good. 10/23/2022: The posterior wound continues to contract at a remarkable rate. She has had some bleeding from the anterior wound over the last 24 hours to the extent that it clogged the tubing for her wound VAC.. It appears to be coming from the small residual tunneled portion of the wound. No concern for infection. Electronic Signature(s) Signed: 10/23/2022 9:18:02 AM By: Duanne Guess MD FACS Previous Signature: 10/23/2022 9:14:11 AM Version By: Duanne Guess MD FACS Entered By: Duanne Guess on 10/23/2022 06:18:02 -------------------------------------------------------------------------------- Chemical Cauterization Details Patient Name: Date of Service: [name], [name] Long 10/23/2022 8:15 A M Medical Record Number: [id number] Patient Account Number: [id number] Date of Birth/Sex: Treating RN: Dec 18, 1940 (82 y.o. [name] Provider: PA Zenovia Jordan, [place] Other Clinician: Referring Provider: Treating Provider/Extender: Sherryl Manges in Treatment: 6 Procedure Performed for: Wound #1 Left,Lateral Lower Leg Performed By: Physician Duanne Guess, MD Post Procedure Diagnosis Same as Pre-procedure Notes using silver nitrate sticks Electronic Signature(s) Signed: 10/23/2022  10:03:55 AM By: Duanne Guess MD FACS Signed: 10/23/2022 5:11:31 PM By: [name] RN, BSN Entered By: [name] on 10/23/2022 06:03:39 -------------------------------------------------------------------------------- Physical Exam Details Patient Name: Date of Service: [name] Long, [name] Long 10/23/2022 8:15 A M Medical Record Number: [id number] Patient Account Number: [id number] Date of Birth/Sex: Treating RN: 1940-09-19 (82 y.o. F) Primary Care Provider: PA Zenovia Jordan, NO Other Clinician: Referring Provider: Treating Provider/Extender: Sherryl Manges in Treatment: 6 Constitutional Hypertensive, asymptomatic. . . . no acute distress. Respiratory Normal work of breathing on room air. Notes 10/23/2022: The posterior wound continues to contract at a remarkable rate. She has had some bleeding from the anterior wound over the last 24 hours to the extent that it clogged the tubing for her wound VAC.. It appears to be coming from the small residual tunneled portion of the wound. No concern for infection. Electronic Signature(s) Signed: 10/23/2022 9:17:42 AM By: Duanne Guess MD FACS Entered By: Duanne Guess on 10/23/2022 06:17:42 [place], [name] ([id number]) [id number] Page 3 of 8 -------------------------------------------------------------------------------- Physician Orders Details Patient Name: Date of Service: [name] Long, [name] Long 10/23/2022 8:15 A M Medical Record Number: [id number] Patient Account Number: [id number] Date of Birth/Sex: Treating RN: 10-12-1940 (82 y.o. [name] Provider: PA Zenovia Jordan, [place] Other Clinician: Referring Provider: Treating Provider/Extender: Sherryl Manges in Treatment: 6 Verbal / Phone Orders: No Diagnosis Coding ICD-10 Coding Code Description [phone] 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 Z79.01 Long  term (current) use of anticoagulants I50.42 Chronic combined systolic (congestive) and diastolic (congestive) heart failure N18.30 Chronic kidney disease, stage 3 unspecified Follow-up Appointments ppointment in 1 week. - Dr. Lady Gary - RM 1 Return A Monday 8/26 at 08:15 AM Nurse Visit: - RM 1 Thurs 8/29 @ 07:30 am Anesthetic (In clinic) Topical  Lidocaine 4% applied to wound bed Bathing/ Shower/ Hygiene May shower with protection but do not get wound dressing(s) wet. Protect dressing(s) with water repellant cover (for example, large plastic bag) or a cast cover and may then take shower. Negative Presssure Wound Therapy Medela Wound Vac continuously at 136mm/hg - bridge wound 1 and 2 together Black Foam Edema Control - Lymphedema / SCD / Other Elevate legs to the level of the heart or above for 30 minutes daily and/or when sitting for 3-4 times a day throughout the day. Avoid standing for long periods of time. Exercise regularly Wound Treatment Wound #1 - Lower Leg Wound Laterality: Left, Lateral Cleanser: Soap and Water 2 x Per Week/30 Days Discharge Instructions: May shower and wash wound with dial antibacterial soap and water prior to dressing change. Cleanser: Vashe 5.8 (oz) 2 x Per Week/30 Days Discharge Instructions: Cleanse the wound with Vashe prior to applying a clean dressing using gauze sponges, not tissue or cotton balls. Prim Dressing: NPWT ary 2 x Per Week/30 Days Compression Wrap: Kerlix Roll 4.5x3.1 (in/yd) 2 x Per Week/30 Days Discharge Instructions: Apply Kerlix and Coban compression as directed. Compression Wrap: Coban Self-Adherent Wrap 4x5 (in/yd) 2 x Per Week/30 Days Discharge Instructions: Apply over Kerlix as directed. Wound #3 - Lower Leg Wound Laterality: Left, Posterior Cleanser: Soap and Water 2 x Per Week/30 Days Discharge Instructions: May shower and wash wound with dial antibacterial soap and water prior to dressing change. Cleanser: Vashe 5.8 (oz) 2 x  Per Week/30 Days Discharge Instructions: Cleanse the wound with Vashe prior to applying a clean dressing using gauze sponges, not tissue or cotton balls. Prim Dressing: NPWT ary 2 x Per Week/30 Days Compression Wrap: Kerlix Roll 4.5x3.1 (in/yd) 2 x Per Week/30 Days [name] Long, [name] Long ([id number]) [id number] Page 4 of 8 Discharge Instructions: Apply Kerlix and Coban compression as directed. Compression Wrap: Coban Self-Adherent Wrap 4x5 (in/yd) 2 x Per Week/30 Days Discharge Instructions: Apply over Kerlix as directed. Electronic Signature(s) Signed: 10/23/2022 10:03:55 AM By: Duanne Guess MD FACS Entered By: Duanne Guess on 10/23/2022 06:18:15 -------------------------------------------------------------------------------- Problem List Details Patient Name: Date of Service: [name] Long, [name] Long 10/23/2022 8:15 A M Medical Record Number: [id number] Patient Account Number: [id number] Date of Birth/Sex: Treating RN: 24-Sep-1940 (82 y.o. [name] Provider: PA Zenovia Jordan, [place] Other Clinician: Referring Provider: Treating Provider/Extender: Sherryl Manges in Treatment: 6 Active Problems ICD-10 Encounter Code Description Active Date MDM Diagnosis L97.822 Non-pressure chronic ulcer of other part of left lower leg with fat layer exposed7/05/2022 No Yes E11.622 Type 2 diabetes mellitus with other skin ulcer 09/06/2022 No Yes Z79.01 Long term (current) use of anticoagulants 09/06/2022 No Yes I50.42 Chronic combined systolic (congestive) and diastolic (congestive) heart failure 09/06/2022 No Yes N18.30 Chronic kidney disease, stage 3 unspecified 09/06/2022 No Yes Inactive Problems Resolved Problems Electronic Signature(s) Signed: 10/23/2022 9:12:53 AM By: Duanne Guess MD FACS Entered By: Duanne Guess on 10/23/2022 06:12:53 -------------------------------------------------------------------------------- Progress Note Details Patient Name: Date of  Service: [name] Long, [name] Long 10/23/2022 8:15 A M Medical Record Number: [id number] Patient Account Number: [id number] AYIRA, MICHEALS ([id number]) [id number] Page 5 of 8 Date of Birth/Sex: Treating RN: Jan 27, 1941 (82 y.o. F) Primary Care Provider: PA Zenovia Jordan, NO Other Clinician: Referring Provider: Treating Provider/Extender: Sherryl Manges in Treatment: 6 Subjective Chief Complaint Information obtained from Patient Patient seen for complaints of Non-Healing Wound. History of Present Illness (HPI) ADMISSION 09/06/2022 This is an 82 year old type II diabetic (no A1c to review and patient is unaware of  her last value) with a history of atrial fibrillation on chronic anticoagulant therapy, lower extremity edema, congestive heart failure, and stage III kidney disease. She divides her time between her home in [place]. [place] and here in [place] where her daughter lives. While in [place], she struck her leg on her bed frame and developed a large hematoma. She was evaluated in the emergency department there and imaging was negative for any deeper injury, such as a fracture. She returned to [facility] and has had an urgent care and emergency department visit to evaluate the site. As there had been no significant improvement in her wound, she was referred to the wound care center for further evaluation and management. I am unable to see any evidence of an attempt to evacuate the hematoma in the electronic medical record.. There are 2 openings in her skin, but they connect to each other with significant tunneling and undermining. I am unsure if these opened spontaneously or there was a non- documented attempt to drain the hematoma site. The majority of her left lower leg from below the tibial tuberosity to just above the ankle and from the anterior tibia wrapping laterally and posteriorly to nearly the medial aspect of her leg is involved. There is a foul odor coming from her wound.  There is no frank pus but there is significant retained clot that has likely served as culture media. ABI in clinic today was 0.84. 09/08/2022: The patient came in today as an add-on due to bloody drainage that was saturating her dressing. On inspection, it appeared that there was no frank fresh blood and no active site of bleeding identified; it appears that more of the hematoma was liquefied and draining from her wound. 09/13/2022: The wound has cleaned up considerably. There is no longer any foul odor and the drainage has decreased significantly. The patient is also complaining of less pain. There is still some nonviable subcutaneous tissue and fat present but overall everything is improved from the last visit. Her culture grew out a polymicrobial population of multiple drug-resistant organisms. She is currently taking Augmentin and levofloxacin as suggested by the culture data. 09/20/2022: The cavity is contracting nicely. There is no longer a connection between the posterior wound and the lateral leg wound. The posterior wound has a little bit of slough on the surface but the larger main wound is very clean. She has been approved for a wound VAC and we will apply that today. 09/25/2022: Post Wound sloughly- alginate Ag. Vac on lateral wound; decreased undermining. 10/02/2022: There is a little bit of slough on the posterior wound. The lateral leg wound continues to contract at a remarkable rate. It is clean without any slough or other debris accumulation. 10/09/2022: The posterior wound is about half the size as it was last week. The lateral leg wound has contracted remarkably. There is still some undermining from 9-12 o'clock. Everything is very clean. 10/19/2022: Both wounds have contracted considerably. Once again, the posterior wound is about half the size as it was last week. The tunnel is down to about a centimeter. Everything is extremely clean. Edema control is good. 10/23/2022: The posterior  wound continues to contract at a remarkable rate. She has had some bleeding from the anterior wound over the last 24 hours to the extent that it clogged the tubing for her wound VAC.. It appears to be coming from the small residual tunneled portion of the wound. No concern for infection. Patient History Information obtained from Patient. Family  History No family history of Hereditary Spherocytosis, Hypertension, Kidney Disease, Lung Disease, Seizures, Stroke, Thyroid Problems, Tuberculosis. Social History Never smoker, Marital Status - Widowed, Alcohol Use - Never, Drug Use - No History, Caffeine Use - Never. Medical History Cardiovascular Patient has history of Arrhythmia - A fib, Congestive Heart Failure, Hypertension Endocrine Patient has history of Type II Diabetes - since 2019 Medical A Surgical History Notes nd Cardiovascular High Cholesterol Genitourinary Kidney Disease Objective Constitutional Hypertensive, asymptomatic. no acute distress. Vitals Time Taken: 8:37 AM, Height: 64 in, Weight: 162 lbs, BMI: 27.8, Temperature: 97.5 F, Pulse: 66 bpm, Respiratory Rate: 18 breaths/min, Blood Pressure: 166/83 mmHg. [place], [place] ([id number]) [id number] Page 6 of 8 Respiratory Normal work of breathing on room air. General Notes: 10/23/2022: The posterior wound continues to contract at a remarkable rate. She has had some bleeding from the anterior wound over the last 24 hours to the extent that it clogged the tubing for her wound VAC.. It appears to be coming from the small residual tunneled portion of the wound. No concern for infection. Integumentary (Hair, Skin) Wound #1 status is Open. Original cause of wound was Hematoma. The date acquired was: 08/05/2022. The wound has been in treatment 6 weeks. The wound is located on the Left,Lateral Lower Leg. The wound measures 2.5cm length x 1cm width x 0.1cm depth; 1.963cm^2 area and 0.196cm^3 volume. There is  Fat Layer (Subcutaneous Tissue) exposed. There is no undermining noted, however, there is tunneling at 11:00 with a maximum distance of 1.3cm. There is a medium amount of sanguinous drainage noted. The wound margin is distinct with the outline attached to the wound base. There is large (67-100%) red, friable granulation within the wound bed. There is no necrotic tissue within the wound bed. The periwound skin appearance had no abnormalities noted for texture. The periwound skin appearance had no abnormalities noted for moisture. The periwound skin appearance exhibited: Hemosiderin Staining. Periwound temperature was noted as No Abnormality. Wound #3 status is Open. Original cause of wound was Trauma. The date acquired was: 09/20/2022. The wound has been in treatment 4 weeks. The wound is located on the Left,Posterior Lower Leg. The wound measures 0.8cm length x 1.4cm width x 0.1cm depth; 0.88cm^2 area and 0.088cm^3 volume. There is Fat Layer (Subcutaneous Tissue) exposed. There is no tunneling or undermining noted. There is a medium amount of sanguinous drainage noted. The wound margin is flat and intact. There is large (67-100%) red granulation within the wound bed. There is no necrotic tissue within the wound bed. The periwound skin appearance had no abnormalities noted for texture. The periwound skin appearance had no abnormalities noted for moisture. The periwound skin appearance exhibited: Hemosiderin Staining. Periwound temperature was noted as No Abnormality. Assessment Active Problems ICD-10 Non-pressure chronic ulcer of other part of left lower leg with fat layer exposed Type 2 diabetes mellitus with other skin ulcer Long term (current) use of anticoagulants Chronic combined systolic (congestive) and diastolic (congestive) heart failure Chronic kidney disease, stage 3 unspecified Procedures Wound #1 Pre-procedure diagnosis of Wound #1 is a Diabetic Wound/Ulcer of the Lower Extremity  located on the Left,Lateral Lower Leg . An Chemical Cauterization procedure was performed by Duanne Guess, MD. Post procedure Diagnosis Wound #1: Same as Pre-Procedure Notes: using silver nitrate sticks Plan Follow-up Appointments: Return Appointment in 1 week. - Dr. Lady Gary - RM 1 Monday 8/26 at 08:15 AM Nurse Visit: - RM 1 Thurs 8/29 @ 07:30 am Anesthetic: (In clinic) Topical Lidocaine 4% applied to wound  bed Bathing/ Shower/ Hygiene: May shower with protection but do not get wound dressing(s) wet. Protect dressing(s) with water repellant cover (for example, large plastic bag) or a cast cover and may then take shower. Negative Presssure Wound Therapy: Medela Wound Vac continuously at 158mm/hg - bridge wound 1 and 2 together Black Foam Edema Control - Lymphedema / SCD / Other: Elevate legs to the level of the heart or above for 30 minutes daily and/or when sitting for 3-4 times a day throughout the day. Avoid standing for long periods of time. Exercise regularly WOUND #1: - Lower Leg Wound Laterality: Left, Lateral Cleanser: Soap and Water 2 x Per Week/30 Days Discharge Instructions: May shower and wash wound with dial antibacterial soap and water prior to dressing change. Cleanser: Vashe 5.8 (oz) 2 x Per Week/30 Days Discharge Instructions: Cleanse the wound with Vashe prior to applying a clean dressing using gauze sponges, not tissue or cotton balls. Prim Dressing: NPWT 2 x Per Week/30 Days ary Com pression Wrap: Kerlix Roll 4.5x3.1 (in/yd) 2 x Per Week/30 Days Discharge Instructions: Apply Kerlix and Coban compression as directed. Com pression Wrap: [place] Self-Adherent Wrap 4x5 (in/yd) 2 x Per Week/30 Days Discharge Instructions: Apply over Kerlix as directed. WOUND #3: - Lower Leg Wound Laterality: Left, Posterior Cleanser: Soap and Water 2 x Per Week/30 Days Discharge Instructions: May shower and wash wound with dial antibacterial soap and water prior to dressing  change. Cleanser: Vashe 5.8 (oz) 2 x Per Week/30 Days [name] Long, [name] Long ([id number]) [id number] Page 7 of 8 Discharge Instructions: Cleanse the wound with Vashe prior to applying a clean dressing using gauze sponges, not tissue or cotton balls. Prim Dressing: NPWT 2 x Per Week/30 Days ary Com pression Wrap: Kerlix Roll 4.5x3.1 (in/yd) 2 x Per Week/30 Days Discharge Instructions: Apply Kerlix and Coban compression as directed. Com pression Wrap: Cob[place] Self-Adherent Wrap 4x5 (in/yd) 2 x Per Week/30 Days Discharge Instructions: Apply over Kerlix as directed. 10/23/2022: The posterior wound continues to contract at a remarkable rate. She has had some bleeding from the anterior wound over the last 24 hours to the extent that it clogged the tubing for her wound VAC.. It appears to be coming from the small residual tunneled portion of the wound. No concern for infection. No debridement was necessary today. I chemically cauterized the tunneled portion of her anterior wound with silver nitrate. We will continue negative pressure wound therapy. Follow-up in 1 week. Electronic Signature(s) Signed: 10/23/2022 9:18:55 AM By: Duanne Guess MD FACS Entered By: Duanne Guess on 10/23/2022 06:18:55 -------------------------------------------------------------------------------- HxROS Details Patient Name: Date of Service: [name] Long, [name] Long 10/23/2022 8:15 A M Medical Record Number: [id number] Patient Account Number: [id number] Date of Birth/Sex: Treating RN: 27-Aug-1940 (82 y.o. F) Primary Care Provider: PA Zenovia Jordan, NO Other Clinician: Referring Provider: Treating Provider/Extender: Sherryl Manges in Treatment: 6 Information Obtained From Patient Cardiovascular Medical History: Positive for: Arrhythmia - A fib; Congestive Heart Failure; Hypertension Past Medical History Notes: High Cholesterol Endocrine Medical History: Positive for: Type II Diabetes - since  2019 Genitourinary Medical History: Past Medical History Notes: Kidney Disease Immunizations Pneumococcal Vaccine: Received Pneumococcal Vaccination: No Implantable Devices No devices added Family and Social History Hereditary Spherocytosis: No; Hypertension: No; Kidney Disease: No; Lung Disease: No; Seizures: No; Stroke: No; Thyroid Problems: No; Tuberculosis: No; Never smoker; Marital Status - Widowed; Alcohol Use: Never; Drug Use: No History; Caffeine Use: Never; Financial Concerns: No; Food, Clothing or Shelter Needs: No; Support System Lacking: No; Transportation Concerns: No Electronic  Signature(s) Signed: 10/23/2022 10:03:55 AM By: Duanne Guess MD FACS Entered By: Duanne Guess on 10/23/2022 06:16:57 [name] Long ([id number]) [id number] Page 8 of 8 -------------------------------------------------------------------------------- SuperBill Details Patient Name: Date of Service: [name] Long, [name] Long 10/23/2022 Medical Record Number: [id number] Patient Account Number: [id number] Date of Birth/Sex: Treating RN: [birth year] (82 y.o. 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 Z79.01 Long term (current) use of anticoagulants I50.42 Chronic combined systolic (congestive) and diastolic (congestive) heart failure N18.30 Chronic kidney disease, stage 3 unspecified Facility Procedures : CPT4 Code: 13244010 Description: 27253 - WOUND VAC-50 SQ CM OR LESS Modifier: Quantity: 1 : CPT4 Code: [id number] Description: 17250 - CHEM CAUT GRANULATION TISS ICD-10 Diagnosis Description L97.822 Non-pressure chronic ulcer of other part of left lower leg with fat layer expos Modifier:  ed Quantity: 1 Physician Procedures : CPT4 Code Description Modifier [id number] 99214 - WC PHYS LEVEL 4 - EST PT 25 ICD-10 Diagnosis Description L97.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

## 2022-10-23 NOTE — Progress Notes (Signed)
[place], [place] ([id number]) [id number] Page 1 of 9 Visit Report for 10/23/2022 Arrival Information Details Patient Name: Date of Service: [name] Long, [name] Long 10/23/2022 8:15 A M Medical Record Number: [id number] Patient Account Number: [id number] Date of Birth/Sex: Treating RN: 01/30/1941 (82 y.o. [name] Long, [name] Primary Care Kenyanna Grzesiak: PA Zenovia Jordan, NO Other Clinician: Referring Efosa Treichler: Treating Tabby Beaston/Extender: Sherryl Manges in Treatment: 6 Visit Information History Since Last Visit Added or deleted any medications: No Patient Arrived: Walker Any new allergies or adverse reactions: No Arrival Time: 08:31 Had a fall or experienced change in No Accompanied By: daughter activities of daily living that may affect Transfer Assistance: None risk of falls: Patient Identification Verified: Yes Signs or symptoms of abuse/neglect since last visito No Secondary Verification Process Completed: Yes Hospitalized since last visit: No Patient Requires Transmission-Based Precautions: No Implantable device outside of the clinic excluding No Patient Has Alerts: No cellular tissue based products placed in the center since last visit: Has Dressing in Place as Prescribed: Yes Has Compression in Place as Prescribed: Yes Pain Present Now: No Electronic Signature(s) Signed: 10/23/2022 5:11:31 PM By: [name] RN, BSN Entered By: [name] on 10/23/2022 05:37:25 -------------------------------------------------------------------------------- Encounter Discharge Information Details Patient Name: Date of Service: [name], Suad 10/23/2022 8:15 A M Medical Record Number: [id number] Patient Account Number: [id number] Date of Birth/Sex: Treating RN: 01/31/1941 (82 y.o. [name] Long Primary Care Yitzchak Kothari: PA Zenovia Jordan, [place] Other Clinician: Referring Aislyn Hayse: Treating Tanaia Hawkey/Extender: Sherryl Manges in Treatment: 6 Encounter Discharge Information  Items Discharge Condition: Stable Ambulatory Status: Walker Discharge Destination: Home Transportation: Private Auto Accompanied By: daughter Schedule Follow-up Appointment: Yes Clinical Summary of Care: Patient Declined Electronic Signature(s) Signed: 10/23/2022 5:11:31 PM By: [name] RN, BSN Entered By: [name] on 10/23/2022 06:26:35 Antony Odea ([id number]) [id number] Page 2 of 9 -------------------------------------------------------------------------------- Lower Extremity Assessment Details Patient Name: Date of Service: ALEAN, MISHKIN 10/23/2022 8:15 A M Medical Record Number: [id number] Patient Account Number: [id number] Date of Birth/Sex: Treating RN: 1941-01-24 (82 y.o. [name] Long Primary Care Carliss Quast: PA Zenovia Jordan, [place] Other Clinician: Referring Neshia Mckenzie: Treating Alexsys Eskin/Extender: Sherryl Manges in Treatment: 6 Edema Assessment Assessed: [Left: No] [Right: No] Edema: [Left: Ye] [Right: s] Calf Left: Right: Point of Measurement: From Medial Instep 34.5 cm Ankle Left: Right: Point of Measurement: From Medial Instep 24.7 cm Vascular Assessment Pulses: Dorsalis Pedis Palpable: [Left:Yes] Extremity colors, hair growth, and conditions: Extremity Color: [Left:Hyperpigmented] Hair Growth on Extremity: [Left:No] Temperature of Extremity: [Left:Warm < 3 seconds] Electronic Signature(s) Signed: 10/23/2022 5:11:31 PM By: [name] RN, BSN Entered By: [name] on 10/23/2022 05:43:13 -------------------------------------------------------------------------------- Multi Wound Chart Details Patient Name: Date of Service: [name] Long, [name] Long 10/23/2022 8:15 A M Medical Record Number: [id number] Patient Account Number: [id number] Date of Birth/Sex: Treating RN: [birth year] (82 y.o. F) Primary Care Hulen Mandler: PA TIENT, NO Other Clinician: Referring Shanyiah Conde: Treating Notnamed Croucher/Extender: Sherryl Manges in  Treatment: 6 Vital Signs Height(in): 64 Pulse(bpm): 66 Weight(lbs): 162 Blood Pressure(mmHg): 166/83 Body Mass Index(BMI): 27.8 Temperature(F): 97.5 Respiratory Rate(breaths/min): 18 [1:Photos:] [N/A:N/A [phone].pdf Page 3 of 9] Left, Lateral Lower Leg Left, Posterior Lower Leg N/A Wound Location: Hematoma Trauma N/A Wounding Event: Diabetic Wound/Ulcer of the Lower Infection - not elsewhere classified N/A Primary Etiology: Extremity Arrhythmia, Congestive Heart Failure, Arrhythmia, Congestive Heart Failure, N/A Comorbid History: Hypertension, Type II Diabetes Hypertension, Type II Diabetes 08/05/2022 09/20/2022 N/A Date Acquired: 6 4 N/A Weeks of Treatment: Open Open N/A Wound Status: No No N/A Wound Recurrence: Yes No N/A Clustered Wound: 2.5x1x0.1  0.8x1.4x0.1 N/A Measurements L x W x D (cm) 1.963 0.88 N/A A (cm) : rea 0.196 0.088 N/A Volume (cm) : 94.40% 79.70% N/A % Reduction in A rea: 99.40% 79.70% N/A % Reduction in Volume: 11 Position 1 (o'clock): 1.3 Maximum Distance 1 (cm): Yes No N/A Tunneling: Grade 2 Full Thickness Without Exposed N/A Classification: Support Structures Medium Medium N/A Exudate Amount: Sanguinous Sanguinous N/A Exudate Type: red red N/A Exudate Color: Distinct, outline attached Flat and Intact N/A Wound Margin: Large (67-100%) Large (67-100%) N/A Granulation Amount: Red, Friable Red N/A Granulation Quality: None Present (0%) None Present (0%) N/A Necrotic Amount: Fat Layer (Subcutaneous Tissue): Yes Fat Layer (Subcutaneous Tissue): Yes N/A Exposed Structures: Fascia: No Fascia: No Tendon: No Tendon: No Muscle: No Muscle: No Joint: No Joint: No Bone: No Bone: No Small (1-33%) Small (1-33%) N/A Epithelialization: No Abnormalities Noted No Abnormalities Noted N/A Periwound Skin Texture: No Abnormalities Noted No Abnormalities Noted N/A Periwound Skin Moisture: Hemosiderin Staining:  Yes Hemosiderin Staining: Yes N/A Periwound Skin Color: No Abnormality No Abnormality N/A Temperature: Chemical Cauterization N/A N/A Procedures Performed: Negative Pressure Wound Therapy Maintenance (NPWT) Treatment Notes Electronic Signature(s) Signed: 10/23/2022 9:13:00 AM By: Duanne Guess MD FACS Entered By: Duanne Guess on 10/23/2022 06:13:00 -------------------------------------------------------------------------------- Multi-Disciplinary Care Plan Details Patient Name: Date of Service: [name] Long, Monnie 10/23/2022 8:15 A M Medical Record Number: [id number] Patient Account Number: [id number] Date of Birth/Sex: Treating RN: June 19, 1940 (82 y.o. [name] Long Primary Care Harles Evetts: PA Zenovia Jordan, [place] Other Clinician: Referring Ana Liaw: Treating Adryan Druckenmiller/Extender: Sherryl Manges in Treatment: 6 Multidisciplinary Care Plan reviewed with physician Active Inactive Venous Leg Ulcer [place], Chrishawna ([id number]) [id number] Page 4 of 9 Nursing Diagnoses: Knowledge deficit related to disease process and management Potential for venous Insuffiency (use before diagnosis confirmed) Goals: Patient will maintain optimal edema control Date Initiated: 10/19/2022 Target Resolution Date: 11/16/2022 Goal Status: Active Interventions: Assess peripheral edema status every visit. Compression as ordered Treatment Activities: Therapeutic compression applied : 10/19/2022 Notes: Wound/Skin Impairment Nursing Diagnoses: Impaired tissue integrity Goals: Patient/caregiver will verbalize understanding of skin care regimen Date Initiated: 09/06/2022 Target Resolution Date: 11/04/2022 Goal Status: Active Interventions: Assess patient/caregiver ability to obtain necessary supplies Assess patient/caregiver ability to perform ulcer/skin care regimen upon admission and as needed Assess ulceration(s) every visit Provide education on ulcer and skin care Screen for  HBO Treatment Activities: Skin care regimen initiated : 09/06/2022 Topical wound management initiated : 09/06/2022 Notes: Electronic Signature(s) Signed: 10/23/2022 5:11:31 PM By: [name] RN, BSN Entered By: [name] on 10/23/2022 05:53:45 -------------------------------------------------------------------------------- Negative Pressure Wound Therapy Maintenance (NPWT) Details Patient Name: Date of Service: JAZZMYNE, KELLOUGH 10/23/2022 8:15 A M Medical Record Number: [id number] Patient Account Number: [id number] Date of Birth/Sex: Treating RN: 13-Oct-1940 (82 y.o. [name] Long Primary Care Jerzi Tigert: PA Zenovia Jordan, [place] Other Clinician: Referring Krystl Wickware: Treating Tehani Mersman/Extender: Sherryl Manges in Treatment: 6 NPWT Maintenance Performed for: Wound #1 Left, Lateral Lower Leg Additional Injuries Covered: Yes Additional Injuries: Wound #3 Left, Posterior Lower Leg Performed By: [name], RN Type: Medela [employer] Coverage Size (sq cm): 3.62 Pressure Type: Constant Pressure Setting: 125 mmHG Drain Type: None Primary Contact: None Sponge/Dressing Type: Foam- Black Date Initiated: 09/20/2022 Dressing Removed: Yes Quantity of Sponges/Gauze Removed: 2 Canister Changed: Yes Canister Exudate Volume: 50 [place], [place] ([id number]) [phone].pdf Page 5 of 9 Dressing Reapplied: Yes Quantity of Sponges/Gauze Inserted: 2 Respones T Treatment: o good Days On NPWT : 34 Post Procedure Diagnosis Same as Pre-procedure Electronic Signature(s) Signed: 10/23/2022 5:11:31 PM  By: [name] RN, BSN Entered By: [name] on 10/23/2022 06:05:47 -------------------------------------------------------------------------------- Pain Assessment Details Patient Name: Date of Service: ADELINE, RANEY 10/23/2022 8:15 A M Medical Record Number: [id number] Patient Account Number: [id number] Date of Birth/Sex: Treating RN: 04/21/1940 (82 y.o. [name] Long Primary Care Kena Limon: PA Zenovia Jordan, [place] Other Clinician: Referring Kennedy Brines: Treating Harlene Petralia/Extender: Sherryl Manges in Treatment: 6 Active Problems Location of Pain Severity and Description of Pain Patient Has Paino No Site Locations Rate the pain. Current Pain Level: 0 Pain Management and Medication Current Pain Management: Electronic Signature(s) Signed: 10/23/2022 5:11:31 PM By: [name] RN, BSN Entered By: [name] on 10/23/2022 05:39:02 -------------------------------------------------------------------------------- Patient/Caregiver Education Details Patient Name: Date of Service: [name] Long, Pranavi 8/19/2024andnbsp8:15 A M Medical Record Number: [id number] Patient Account Number: [id number] Date of Birth/Gender: Treating RN: [birth year] (82 y.o. [name] Long Primary Care Physician: PA Fidela [place] Other Clinician: AZRAEL, THAKKER ([id number]) [id number] Page 6 of 9 Referring Physician: Treating Physician/Extender: Sherryl Manges in Treatment: 6 Education Assessment Education Provided To: Patient Education Topics Provided Venous: Methods: Explain/Verbal Responses: Reinforcements needed, State content correctly Wound/Skin Impairment: Methods: Explain/Verbal Responses: Reinforcements needed, State content correctly Electronic Signature(s) Signed: 10/23/2022 5:11:31 PM By: [name] RN, BSN Entered By: [name] on 10/23/2022 05:54:58 -------------------------------------------------------------------------------- Wound Assessment Details Patient Name: Date of Service: ZABELLE, LOISEL 10/23/2022 8:15 A M Medical Record Number: [id number] Patient Account Number: [id number] Date of Birth/Sex: Treating RN: Jul 06, 1940 (82 y.o. [name] Long Primary Care Raymonda Pell: PA Zenovia Jordan, [place] Other Clinician: Referring Burgess Sheriff: Treating Marvin Maenza/Extender: Sherryl Manges in Treatment: 6 Wound Status Wound  Number: 1 Primary Diabetic Wound/Ulcer of the Lower Extremity Etiology: Wound Location: Left, Lateral Lower Leg Wound Status: Open Wounding Event: Hematoma Comorbid Arrhythmia, Congestive Heart Failure, Hypertension, Type II Date Acquired: 08/05/2022 History: Diabetes Weeks Of Treatment: 6 Clustered Wound: Yes Photos Wound Measurements Length: (cm) 2.5 Width: (cm) 1 Depth: (cm) 0.1 Area: (cm) 1.963 Volume: (cm) 0.196 % Reduction in Area: 94.4% % Reduction in Volume: 99.4% Epithelialization: Small (1-33%) Tunneling: Yes Position (o'clock): 11 Maximum Distance: (cm) 1.3 Undermining: No Wound Description Wareing, Phillipa ([id number]) Classification: Grade 2 Wound Margin: Distinct, outline attached Exudate Amount: Medium Exudate Type: Sanguinous Exudate Color: red [phone].pdf Page 7 of 9 Foul Odor After Cleansing: No Slough/Fibrino Yes Wound Bed Granulation Amount: Large (67-100%) Exposed Structure Granulation Quality: Red, Friable Fascia Exposed: No Necrotic Amount: None Present (0%) Fat Layer (Subcutaneous Tissue) Exposed: Yes Tendon Exposed: No Muscle Exposed: No Joint Exposed: No Bone Exposed: No Periwound Skin Texture Texture Color No Abnormalities Noted: Yes No Abnormalities Noted: No Hemosiderin Staining: Yes Moisture No Abnormalities Noted: Yes Temperature / Pain Temperature: No Abnormality Treatment Notes Wound #1 (Lower Leg) Wound Laterality: Left, Lateral Cleanser Soap and Water Discharge Instruction: May shower and wash wound with dial antibacterial soap and water prior to dressing change. Vashe 5.8 (oz) Discharge Instruction: Cleanse the wound with Vashe prior to applying a clean dressing using gauze sponges, not tissue or cotton balls. Peri-Wound Care Topical Primary Dressing NPWT Secondary Dressing Secured With Compression Wrap Kerlix Roll 4.5x3.1 (in/yd) Discharge Instruction: Apply Kerlix and Coban compression as  directed. Coban Self-Adherent Wrap 4x5 (in/yd) Discharge Instruction: Apply over Kerlix as directed. Compression Stockings Add-Ons Electronic Signature(s) Signed: 10/23/2022 5:11:31 PM By: [name] RN, BSN Entered By: [name] on 10/23/2022 05:51:56 -------------------------------------------------------------------------------- Wound Assessment Details Patient Name: Date of Service: KATHERLINE, DAVIE 10/23/2022 8:15 A M Medical Record Number: [id number] Patient Account Number: [id number] Date of  Birth/Sex: Treating RN: [birth year] (82 y.o. [name] Long Primary Care Elijah Phommachanh: PA Zenovia Jordan, [place] Other Clinician: Referring Monica Codd: Treating Colsen Modi/Extender: Sherryl Manges in Treatment: 6 Wound Status [place], [place] ([id number]) [id number] Page 8 of 9 Wound Number: 3 Primary Infection - not elsewhere classified Etiology: Wound Location: Left, Posterior Lower Leg Wound Status: Open Wounding Event: Trauma Comorbid Arrhythmia, Congestive Heart Failure, Hypertension, Type II Date Acquired: 09/20/2022 History: Diabetes Weeks Of Treatment: 4 Clustered Wound: No Photos Wound Measurements Length: (cm) 0.8 Width: (cm) 1.4 Depth: (cm) 0.1 Area: (cm) 0.88 Volume: (cm) 0.088 % Reduction in Area: 79.7% % Reduction in Volume: 79.7% Epithelialization: Small (1-33%) Tunneling: No Undermining: No Wound Description Classification: Full Thickness Without Exposed Support Structures Wound Margin: Flat and Intact Exudate Amount: Medium Exudate Type: Sanguinous Exudate Color: red Foul Odor After Cleansing: No Slough/Fibrino No Wound Bed Granulation Amount: Large (67-100%) Exposed Structure Granulation Quality: Red Fascia Exposed: No Necrotic Amount: None Present (0%) Fat Layer (Subcutaneous Tissue) Exposed: Yes Tendon Exposed: No Muscle Exposed: No Joint Exposed: No Bone Exposed: No Periwound Skin Texture Texture Color No Abnormalities  Noted: Yes No Abnormalities Noted: No Hemosiderin Staining: Yes Moisture No Abnormalities Noted: Yes Temperature / Pain Temperature: No Abnormality Treatment Notes Wound #3 (Lower Leg) Wound Laterality: Left, Posterior Cleanser Soap and Water Discharge Instruction: May shower and wash wound with dial antibacterial soap and water prior to dressing change. Vashe 5.8 (oz) Discharge Instruction: Cleanse the wound with Vashe prior to applying a clean dressing using gauze sponges, not tissue or cotton balls. Peri-Wound Care Topical Primary Dressing NPWT Secondary Dressing Secured With Compression Wrap Kerlix Roll 4.5x3.1 (in/yd) Discharge Instruction: Apply Kerlix and Coban compression as directed. [place], [place] ([id number]) [id number] Page 9 of 9 Coban Self-Adherent Wrap 4x5 (in/yd) Discharge Instruction: Apply over Kerlix as directed. Compression Stockings Add-Ons Electronic Signature(s) Signed: 10/23/2022 5:11:31 PM By: [name] RN, BSN Entered By: [name] on 10/23/2022 05:52:28 -------------------------------------------------------------------------------- Vitals Details Patient Name: Date of Service: [name], Satomi 10/23/2022 8:15 A M Medical Record Number: [id number] Patient Account Number: [id number] Date of Birth/Sex: Treating RN: Oct 20, 1940 (82 y.o. [name] Long Primary Care Raynell Upton: PA Zenovia Jordan, [place] Other Clinician: Referring Shebra Muldrow: Treating Kenyada Dosch/Extender: Sherryl Manges in Treatment: 6 Vital Signs Time Taken: 08:37 Temperature (F): 97.5 Height (in): 64 Pulse (bpm): 66 Weight (lbs): 162 Respiratory Rate (breaths/min): 18 Body Mass Index (BMI): 27.8 Blood Pressure (mmHg): 166/83 Reference Range: 80 - 120 mg / dl Electronic Signature(s) Signed: 10/23/2022 5:11:31 PM By: [name] RN, BSN Entered By: [name] on 10/23/2022 05:38:56

## 2022-10-26 ENCOUNTER — Encounter (HOSPITAL_BASED_OUTPATIENT_CLINIC_OR_DEPARTMENT_OTHER): Payer: Medicare Other | Admitting: General Surgery

## 2022-10-26 DIAGNOSIS — E11622 Type 2 diabetes mellitus with other skin ulcer: Secondary | ICD-10-CM | POA: Diagnosis not present

## 2022-10-26 NOTE — Progress Notes (Signed)
[place], [place] ([id number]) [id number] Page 1 of 1 Visit Report for 10/26/2022 SuperBill Details Patient Name: Date of Service: [name] Long, [name] Long 10/26/2022 Medical Record Number: [id number] Patient Account Number: [id number] Date of Birth/Sex: Treating RN: 12-01-1940 (82 y.o. 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 Z79.01 Long term (current) use of anticoagulants I50.42 Chronic combined systolic (congestive) and diastolic (congestive) heart failure N18.30 Chronic kidney disease, stage 3 unspecified Facility Procedures CPT4 Code Description Modifier Quantity [phone] - WOUND VAC-50 SQ CM OR LESS 1 ICD-10 Diagnosis Description L97.822 Non-pressure chronic ulcer of other part of left lower leg with fat layer exposed Electronic Signature(s) Signed: 10/26/2022 8:01:11 AM By: Duanne Guess MD FACS Signed: 10/26/2022 4:39:20 PM By: [name] RN, BSN Entered By: [name] on 10/26/2022 05:00:07

## 2022-10-26 NOTE — Progress Notes (Signed)
[place], [place] ([id number]) [id number] Page 1 of 5 Visit Report for 10/26/2022 Arrival Information Details Patient Name: Date of Service: [name] Long, [name] Long 10/26/2022 7:30 A M Medical Record Number: [id number] Patient Account Number: [id number] Date of Birth/Sex: Treating RN: 10/24/1940 (82 y.o. [name] Long, [name] Long Primary Care [name] Long: PA Zenovia Jordan, NO Other Clinician: Referring [name] Long: Treating Thamas Appleyard/Extender: Sherryl Manges in Treatment: 7 Visit Information History Since Last Visit Added or deleted any medications: No Patient Arrived: [name] Any new allergies or adverse reactions: No Arrival Time: 07:32 Had a fall or experienced change in No Accompanied By: daughter activities of daily living that may affect Transfer Assistance: None risk of falls: Patient Identification Verified: Yes Signs or symptoms of abuse/neglect since last visito No Secondary Verification Process Completed: Yes Hospitalized since last visit: No Patient Requires Transmission-Based Precautions: No Implantable device outside of the clinic excluding No Patient Has Alerts: No cellular tissue based products placed in the center since last visit: Has Dressing in Place as Prescribed: Yes Has Compression in Place as Prescribed: Yes Pain Present Now: No Electronic Signature(s) Signed: 10/26/2022 4:39:20 PM By: [name] RN, BSN Entered By: [name] on 10/26/2022 04:57:19 -------------------------------------------------------------------------------- Encounter Discharge Information Details Patient Name: Date of Service: [name] Long, [name] Long 10/26/2022 7:30 A M Medical Record Number: [id number] Patient Account Number: [id number] Date of Birth/Sex: Treating RN: 11/22/1940 (82 y.o. [name] Long Primary Care Lanesha Azzaro: PA Zenovia Jordan, [name] Long Other Clinician: Referring [name] Long: Treating [name] Long/Extender: Sherryl Manges in Treatment: 7 Encounter Discharge Information  Items Discharge Condition: Stable Ambulatory Status: [name] Discharge Destination: Home Transportation: Private Auto Accompanied By: daughter Schedule Follow-up Appointment: Yes Clinical Summary of Care: Patient Declined Electronic Signature(s) Signed: 10/26/2022 4:39:20 PM By: [name] RN, BSN Entered By: [name] on 10/26/2022 04:59:56 [place], [name] Long ([id number]) [id number] Page 2 of 5 -------------------------------------------------------------------------------- Negative Pressure Wound Therapy Maintenance (NPWT) Details Patient Name: Date of Service: [name] Long, [name] Long 10/26/2022 7:30 A M Medical Record Number: [id number] Patient Account Number: [id number] Date of Birth/Sex: Treating RN: [birth year] (82 y.o. [name] Long Primary Care [name] Long: PA Zenovia Jordan, [name] Long Other Clinician: Referring [name] Long: Treating Yuli Lanigan/Extender: Sherryl Manges in Treatment: 7 NPWT Maintenance Performed for: Wound #1 Left, Lateral Lower Leg Additional Injuries Covered: Yes Additional Injuries: Wound #3 Left, Posterior Lower Leg Performed By: [name], RN Type: Medela [employer] Coverage Size (sq cm): 3.62 Pressure Type: Constant Pressure Setting: 125 mmHG Drain Type: None Primary Contact: None Sponge/Dressing Type: Foam- Black Date Initiated: 09/20/2022 Dressing Removed: Yes Quantity of Sponges/Gauze Removed: 2 Canister Changed: No Canister Exudate Volume: 1 Dressing Reapplied: Yes Quantity of Sponges/Gauze Inserted: 2 Respones T Treatment: o good Days On NPWT : 37 Electronic Signature(s) Signed: 10/26/2022 4:39:20 PM By: [name] RN, BSN Entered By: [name] on 10/26/2022 04:59:05 -------------------------------------------------------------------------------- Patient/Caregiver Education Details Patient Name: Date of Service: [name] Long, [name] Long 8/22/2024andnbsp7:30 A M Medical Record Number: [id number] Patient Account Number:  [id number] Date of Birth/Gender: Treating RN: 28-Jul-1940 (82 y.o. [name] Long Primary Care Physician: PA Zenovia Jordan, [name] Long Other Clinician: Referring Physician: Treating Physician/Extender: Sherryl Manges in Treatment: 7 Education Assessment Education Provided To: Patient Education Topics Provided Venous: Methods: Explain/Verbal Responses: Reinforcements needed, State content correctly Wound/Skin Impairment: Methods: Explain/Verbal Responses: Reinforcements needed, State content correctly Electronic Signature(s) Signed: 10/26/2022 4:39:20 PM By: [name] RN, BSN [name] Long, Signed: 10/26/2022 4:39:20 PM By: [name] RN, BSN [name] Long ([id number]) [id number] Page 3 of 5 Entered By: [name] on 10/26/2022 04:59:40 -------------------------------------------------------------------------------- Wound Assessment Details Patient Name: Date of Service: [name] Long,  [name] Long 10/26/2022 7:30 A M Medical Record Number: [id number] Patient Account Number: [id number] Date of Birth/Sex: Treating RN: Mar 09, 1940 (82 y.o. [name] Long Primary Care Akela Pocius: PA Zenovia Jordan, [name] Long Other Clinician: Referring Rakisha Pincock: Treating Yasmine Kilbourne/Extender: Sherryl Manges in Treatment: 7 Wound Status Wound Number: 1 Primary Etiology: Diabetic Wound/Ulcer of the Lower Extremity Wound Location: Left, Lateral Lower Leg Wound Status: Open Wounding Event: Hematoma Date Acquired: 08/05/2022 Weeks Of Treatment: 7 Clustered Wound: Yes Wound Measurements Length: (cm) 2.5 Width: (cm) 1 Depth: (cm) 0.1 Area: (cm) 1.963 Volume: (cm) 0.196 % Reduction in Area: 94.4% % Reduction in Volume: 99.4% Wound Description Classification: Exudate Amount: Exudate Type: Exudate Color: Grade 2 Medium Sanguinous red Periwound Skin Texture Texture Color No Abnormalities Noted: No No Abnormalities Noted: No Moisture No Abnormalities Noted: No Treatment Notes Wound #1 (Lower Leg)  Wound Laterality: Left, Lateral Cleanser Soap and Water Discharge Instruction: May shower and wash wound with dial antibacterial soap and water prior to dressing change. Vashe 5.8 (oz) Discharge Instruction: Cleanse the wound with Vashe prior to applying a clean dressing using gauze sponges, not tissue or cotton balls. Peri-Wound Care Topical Primary Dressing NPWT Secondary Dressing Secured With Compression Wrap Kerlix Roll 4.5x3.1 (in/yd) Discharge Instruction: Apply Kerlix and Coban compression as directed. Coban Self-Adherent Wrap 4x5 (in/yd) Discharge Instruction: Apply over Kerlix as directed. Compression Stockings [place], [place] ([id number]) [id number] Page 4 of 5 Add-Ons Electronic Signature(s) Signed: 10/26/2022 4:39:20 PM By: [name] RN, BSN Entered By: [name] on 10/26/2022 04:57:52 -------------------------------------------------------------------------------- Wound Assessment Details Patient Name: Date of Service: [name] Long, [name] Long 10/26/2022 7:30 A M Medical Record Number: [id number] Patient Account Number: [id number] Date of Birth/Sex: Treating RN: [birth year] (82 y.o. [name] Long Primary Care Fatemah Pourciau: PA Zenovia Jordan, [name] Long Other Clinician: Referring Mauri Temkin: Treating Meline Russaw/Extender: Sherryl Manges in Treatment: 7 Wound Status Wound Number: 3 Primary Etiology: Infection - not elsewhere classified Wound Location: Left, Posterior Lower Leg Wound Status: Open Wounding Event: Trauma Date Acquired: 09/20/2022 Weeks Of Treatment: 5 Clustered Wound: No Wound Measurements Length: (cm) 0.8 Width: (cm) 1.4 Depth: (cm) 0.1 Area: (cm) 0.88 Volume: (cm) 0.088 % Reduction in Area: 79.7% % Reduction in Volume: 79.7% Wound Description Classification: Full Thickness Without Exposed Support Exudate Amount: Medium Exudate Type: Sanguinous Exudate Color: red Structures Periwound Skin Texture Texture Color No Abnormalities  Noted: No No Abnormalities Noted: No Moisture No Abnormalities Noted: No Treatment Notes Wound #3 (Lower Leg) Wound Laterality: Left, Posterior Cleanser Soap and Water Discharge Instruction: May shower and wash wound with dial antibacterial soap and water prior to dressing change. Vashe 5.8 (oz) Discharge Instruction: Cleanse the wound with Vashe prior to applying a clean dressing using gauze sponges, not tissue or cotton balls. Peri-Wound Care Topical Primary Dressing NPWT Secondary Dressing Secured With Compression Wrap Kerlix Roll 4.5x3.1 (in/yd) Discharge Instruction: Apply Kerlix and Coban compression as directed. [place], [place] ([id number]) [id number] Page 5 of 5 Coban Self-Adherent Wrap 4x5 (in/yd) Discharge Instruction: Apply over Kerlix as directed. Compression Stockings Add-Ons Electronic Signature(s) Signed: 10/26/2022 4:39:20 PM By: [name] RN, BSN Entered By: [name] on 10/26/2022 04:57:52

## 2022-10-30 ENCOUNTER — Encounter (HOSPITAL_BASED_OUTPATIENT_CLINIC_OR_DEPARTMENT_OTHER): Payer: Medicare Other | Admitting: General Surgery

## 2022-10-30 DIAGNOSIS — E11622 Type 2 diabetes mellitus with other skin ulcer: Secondary | ICD-10-CM | POA: Diagnosis not present

## 2022-10-30 NOTE — Progress Notes (Signed)
[place], [place] ([id number]) [id number] Page 1 of 8 Visit Report for 10/30/2022 Chief Complaint Document Details Patient Name: Date of Service: [name] Long, [name] Long 10/30/2022 8:15 A M Medical Record Number: [id number] Patient Account Number: [id number] Date of Birth/Sex: Treating RN: December 12, 1940 (82 y.o. F) Primary Care Provider: PA Zenovia Jordan, NO Other Clinician: Referring Provider: Treating Provider/Extender: Sherryl Manges in Treatment: 7 Information Obtained from: Patient Chief Complaint Patient seen for complaints of Non-Healing Wound. Electronic Signature(s) Signed: 10/30/2022 9:18:37 AM By: Duanne Guess MD FACS Entered By: Duanne Guess on 10/30/2022 06:18:37 -------------------------------------------------------------------------------- HPI Details Patient Name: Date of Service: [name] Long, [name] Long 10/30/2022 8:15 A M Medical Record Number: [id number] Patient Account Number: [id number] Date of Birth/Sex: Treating RN: [birth year] (82 y.o. F) Primary Care Provider: PA TIENT, NO Other Clinician: Referring Provider: Treating Provider/Extender: Sherryl Manges in Treatment: 7 History of Present Illness HPI Description: ADMISSION 09/06/2022 This is an 82 year old type II diabetic (no A1c to review and patient is unaware of her last value) with a history of atrial fibrillation on chronic anticoagulant therapy, lower extremity edema, congestive heart failure, and stage III kidney disease. She divides her time between her home in [place]. [place] and here in [place] where her daughter lives. While in [place], she struck her leg on her bed frame and developed a large hematoma. She was evaluated in the emergency department there and imaging was negative for any deeper injury, such as a fracture. She returned to [facility] and has had an urgent care and emergency department visit to evaluate the site. As there had been no significant improvement in her wound, she was  referred to the wound care center for further evaluation and management. I am unable to see any evidence of an attempt to evacuate the hematoma in the electronic medical record.. There are 2 openings in her skin, but they connect to each other with significant tunneling and undermining. I am unsure if these opened spontaneously or there was a non- documented attempt to drain the hematoma site. The majority of her left lower leg from below the tibial tuberosity to just above the ankle and from the anterior tibia wrapping laterally and posteriorly to nearly the medial aspect of her leg is involved. There is a foul odor coming from her wound. There is no frank pus but there is significant retained clot that has likely served as culture media. ABI in clinic today was 0.84. 09/08/2022: The patient came in today as an add-on due to bloody drainage that was saturating her dressing. On inspection, it appeared that there was no frank fresh blood and no active site of bleeding identified; it appears that more of the hematoma was liquefied and draining from her wound. 09/13/2022: The wound has cleaned up considerably. There is no longer any foul odor and the drainage has decreased significantly. The patient is also complaining of less pain. There is still some nonviable subcutaneous tissue and fat present but overall everything is improved from the last visit. Her culture grew out a polymicrobial population of multiple drug-resistant organisms. She is currently taking Augmentin and levofloxacin as suggested by the culture data. 09/20/2022: The cavity is contracting nicely. There is no longer a connection between the posterior wound and the lateral leg wound. The posterior wound has a little bit of slough on the surface but the larger main wound is very clean. She has been approved for a wound VAC and we will apply that today. 09/25/2022: Post Wound sloughly- alginate Ag. Vac on lateral wound;  decreased  undermining. 10/02/2022: There is a little bit of slough on the posterior wound. The lateral leg wound continues to contract at a remarkable rate. It is clean without any slough or other debris accumulation. 10/09/2022: The posterior wound is about half the size as it was last week. The lateral leg wound has contracted remarkably. There is still some undermining from 9-12 o'clock. Everything is very clean. [place], [place] ([id number]) [id number] Page 2 of 8 10/19/2022: Both wounds have contracted considerably. Once again, the posterior wound is about half the size as it was last week. The tunnel is down to about a centimeter. Everything is extremely clean. Edema control is good. 10/23/2022: The posterior wound continues to contract at a remarkable rate. She has had some bleeding from the anterior wound over the last 24 hours to the extent that it clogged the tubing for her wound VAC.. It appears to be coming from the small residual tunneled portion of the wound. No concern for infection. 10/30/2022: The posterior wound is nearly closed. The anterior wound had a little bit of oozing but not to the degree as last week. The tunnel is about the same depth while the orifice has contracted a little bit. No concern for infection. Electronic Signature(s) Signed: 10/30/2022 9:19:22 AM By: Duanne Guess MD FACS Entered By: Duanne Guess on 10/30/2022 06:19:22 -------------------------------------------------------------------------------- Physical Exam Details Patient Name: Date of Service: [name] Long, [name] Long 10/30/2022 8:15 A M Medical Record Number: [id number] Patient Account Number: [id number] Date of Birth/Sex: Treating RN: [birth year] (82 y.o. F) Primary Care Provider: PA Zenovia Jordan, NO Other Clinician: Referring Provider: Treating Provider/Extender: Sherryl Manges in Treatment: 7 Constitutional Slightly hypertensive. Bradycardic, asymptomatic. . . no acute  distress. Respiratory Normal work of breathing on room air. Notes 10/30/2022: The posterior wound is nearly closed. The anterior wound had a little bit of oozing but not to the degree as last week. The tunnel is about the same depth while the orifice has contracted a little bit. No concern for infection. Electronic Signature(s) Signed: 10/30/2022 9:20:10 AM By: Duanne Guess MD FACS Entered By: Duanne Guess on 10/30/2022 06:20:10 -------------------------------------------------------------------------------- Physician Orders Details Patient Name: Date of Service: [name] Long, [name] Long 10/30/2022 8:15 A M Medical Record Number: [id number] Patient Account Number: [id number] Date of Birth/Sex: Treating RN: 04/29/1940 (82 y.o. [name] Provider: PA Zenovia Jordan, [place] Other Clinician: Referring Provider: Treating Provider/Extender: Sherryl Manges in Treatment: 7 Verbal / Phone Orders: No Diagnosis Coding ICD-10 Coding Code Description [phone] 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 Z79.01 Long term (current) use of anticoagulants I50.42 Chronic combined systolic (congestive) and diastolic (congestive) heart failure N18.30 Chronic kidney disease, stage 3 unspecified Follow-up Appointments [place], [name] ([id number]) [id number] Page 3 of 8 ppointment in 1 week. - Dr. Lady Gary - RM 1 Return A Tues 9/3 at 09:00 AM Nurse Visit: - RM 1 Thurs 8/29 @ 07:30 am Anesthetic (In clinic) Topical Lidocaine 4% applied to wound bed Bathing/ Shower/ Hygiene May shower with protection but do not get wound dressing(s) wet. Protect dressing(s) with water repellant cover (for example, large plastic bag) or a cast cover and may then take shower. Negative Presssure Wound Therapy Medela Wound Vac continuously at 131mm/hg - bridge wound 1 and 2 together Black  Foam Edema Control - Lymphedema / SCD / Other Elevate legs to the level of the heart or above for 30 minutes daily and/or when sitting  for 3-4 times a day throughout the day. Avoid standing for long periods of time. Exercise regularly Wound Treatment Wound #1 - Lower Leg Wound Laterality: Left, Lateral Cleanser: Soap and Water 2 x Per Week/30 Days Discharge Instructions: May shower and wash wound with dial antibacterial soap and water prior to dressing change. Cleanser: Vashe 5.8 (oz) 2 x Per Week/30 Days Discharge Instructions: Cleanse the wound with Vashe prior to applying a clean dressing using gauze sponges, not tissue or cotton balls. Prim Dressing: Promogran Prisma Matrix, 4.34 (sq in) (silver collagen) 2 x Per Week/30 Days ary Discharge Instructions: Moisten collagen with saline and place at base of tunnel Prim Dressing: NPWT ary 2 x Per Week/30 Days Compression Wrap: Kerlix Roll 4.5x3.1 (in/yd) 2 x Per Week/30 Days Discharge Instructions: Apply Kerlix and Coban compression as directed. Compression Wrap: Coban Self-Adherent Wrap 4x5 (in/yd) 2 x Per Week/30 Days Discharge Instructions: Apply over Kerlix as directed. Wound #3 - Lower Leg Wound Laterality: Left, Posterior Cleanser: Soap and Water 2 x Per Week/30 Days Discharge Instructions: May shower and wash wound with dial antibacterial soap and water prior to dressing change. Cleanser: Vashe 5.8 (oz) 2 x Per Week/30 Days Discharge Instructions: Cleanse the wound with Vashe prior to applying a clean dressing using gauze sponges, not tissue or cotton balls. Prim Dressing: Promogran Prisma Matrix, 4.34 (sq in) (silver collagen) 2 x Per Week/30 Days ary Discharge Instructions: Moisten collagen with saline or hydrogel Secondary Dressing: Woven Gauze Sponges 2x2 in 2 x Per Week/30 Days Discharge Instructions: Apply over primary dressing as directed. Compression Wrap: Kerlix Roll 4.5x3.1 (in/yd) 2 x Per Week/30 Days Discharge  Instructions: Apply Kerlix and Coban compression as directed. Compression Wrap: Coban Self-Adherent Wrap 4x5 (in/yd) 2 x Per Week/30 Days Discharge Instructions: Apply over Kerlix as directed. Electronic Signature(s) Signed: 10/30/2022 11:49:36 AM By: Duanne Guess MD FACS Entered By: Duanne Guess on 10/30/2022 06:21:40 -------------------------------------------------------------------------------- Problem List Details Patient Name: Date of Service: [name] Long, [name] Long 10/30/2022 8:15 A M Medical Record Number: [id number] Patient Account Number: [id number] Date of Birth/Sex: Treating RN: 1940-12-08 (82 y.o. [name] [place], [place] ([id number]) [id number] Page 4 of 8 Primary Care Provider: PA TIENT, NO Other Clinician: Referring Provider: Treating Provider/Extender: Sherryl Manges in Treatment: 7 Active Problems ICD-10 Encounter Code Description Active Date MDM Diagnosis L97.822 Non-pressure chronic ulcer of other part of left lower leg with fat layer exposed7/05/2022 No Yes E11.622 Type 2 diabetes mellitus with other skin ulcer 09/06/2022 No Yes Z79.01 Long term (current) use of anticoagulants 09/06/2022 No Yes I50.42 Chronic combined systolic (congestive) and diastolic (congestive) heart failure 09/06/2022 No Yes N18.30 Chronic kidney disease, stage 3 unspecified 09/06/2022 No Yes Inactive Problems Resolved Problems Electronic Signature(s) Signed: 10/30/2022 9:00:50 AM By: Duanne Guess MD FACS Entered By: Duanne Guess on 10/30/2022 06:00:50 -------------------------------------------------------------------------------- Progress Note Details Patient Name: Date of Service: [name] Long, [name] Long 10/30/2022 8:15 A M Medical Record Number: [id number] Patient Account Number: [id number] Date of Birth/Sex: Treating RN: August 07, 1940 (82 y.o. F) Primary Care Provider: PA Zenovia Jordan, NO Other Clinician: Referring Provider: Treating Provider/Extender: Sherryl Manges in Treatment: 7 Subjective Chief Complaint Information obtained from Patient Patient seen for complaints of Non-Healing Wound. History of Present Illness (HPI) ADMISSION 09/06/2022 This is an 82 year old type II diabetic (no A1c to review and patient is unaware of her last value) with a history of atrial fibrillation on chronic anticoagulant therapy, lower extremity edema, congestive heart failure, and stage III kidney disease. She divides her time between  her home in [place] and here in [place] where her daughter lives. While in [place], she struck her leg on her bed frame and developed a large hematoma. She was evaluated in the emergency department there and imaging was negative for any deeper injury, such as a fracture. She returned to [facility] and has had an urgent care and emergency department visit to evaluate the site. As there had been no significant improvement in her wound, she was referred to the wound care center for further evaluation and management. I am unable to see any evidence of an attempt to evacuate the hematoma in the electronic medical record.. There are 2 openings in her skin, but they connect to each other with significant tunneling and undermining. I am unsure if these opened spontaneously or there was a non- documented attempt to drain the hematoma site. The majority of her left lower leg from below the tibial tuberosity to just above the ankle and from the anterior tibia wrapping laterally and posteriorly to nearly the medial aspect of her leg is involved. There is a foul odor coming from her wound. There is no frank pus but there is significant retained clot that has likely served as culture media. ABI in clinic today was 0.84. 09/08/2022: The patient came in today as an add-on due to bloody drainage that was saturating her dressing. On inspection, it appeared that there was no frank [place], [name] ([id number]) [id number]  Page 5 of 8 fresh blood and no active site of bleeding identified; it appears that more of the hematoma was liquefied and draining from her wound. 09/13/2022: The wound has cleaned up considerably. There is no longer any foul odor and the drainage has decreased significantly. The patient is also complaining of less pain. There is still some nonviable subcutaneous tissue and fat present but overall everything is improved from the last visit. Her culture grew out a polymicrobial population of multiple drug-resistant organisms. She is currently taking Augmentin and levofloxacin as suggested by the culture data. 09/20/2022: The cavity is contracting nicely. There is no longer a connection between the posterior wound and the lateral leg wound. The posterior wound has a little bit of slough on the surface but the larger main wound is very clean. She has been approved for a wound VAC and we will apply that today. 09/25/2022: Post Wound sloughly- alginate Ag. Vac on lateral wound; decreased undermining. 10/02/2022: There is a little bit of slough on the posterior wound. The lateral leg wound continues to contract at a remarkable rate. It is clean without any slough or other debris accumulation. 10/09/2022: The posterior wound is about half the size as it was last week. The lateral leg wound has contracted remarkably. There is still some undermining from 9-12 o'clock. Everything is very clean. 10/19/2022: Both wounds have contracted considerably. Once again, the posterior wound is about half the size as it was last week. The tunnel is down to about a centimeter. Everything is extremely clean. Edema control is good. 10/23/2022: The posterior wound continues to contract at a remarkable rate. She has had some bleeding from the anterior wound over the last 24 hours to the extent that it clogged the tubing for her wound VAC.. It appears to be coming from the small residual tunneled portion of the wound. No concern for  infection. 10/30/2022: The posterior wound is nearly closed. The anterior wound had a little bit of oozing but not to the degree as last week. The tunnel is about  the same depth while the orifice has contracted a little bit. No concern for infection. Patient History Information obtained from Patient. Family History No family history of Hereditary Spherocytosis, Hypertension, Kidney Disease, Lung Disease, Seizures, Stroke, Thyroid Problems, Tuberculosis. Social History Never smoker, Marital Status - Widowed, Alcohol Use - Never, Drug Use - No History, Caffeine Use - Never. Medical History Cardiovascular Patient has history of Arrhythmia - A fib, Congestive Heart Failure, Hypertension Endocrine Patient has history of Type II Diabetes - since 2019 Medical A Surgical History Notes nd Cardiovascular High Cholesterol Genitourinary Kidney Disease Objective Constitutional Slightly hypertensive. Bradycardic, asymptomatic. no acute distress. Vitals Time Taken: 8:38 AM, Height: 64 in, Weight: 162 lbs, BMI: 27.8, Temperature: 98.1 F, Pulse: 54 bpm, Respiratory Rate: 18 breaths/min, Blood Pressure: 155/73 mmHg. Respiratory Normal work of breathing on room air. General Notes: 10/30/2022: The posterior wound is nearly closed. The anterior wound had a little bit of oozing but not to the degree as last week. The tunnel is about the same depth while the orifice has contracted a little bit. No concern for infection. Integumentary (Hair, Skin) Wound #1 status is Open. Original cause of wound was Hematoma. The date acquired was: 08/05/2022. The wound has been in treatment 7 weeks. The wound is located on the Left,Lateral Lower Leg. The wound measures 2.2cm length x 0.8cm width x 0.1cm depth; 1.382cm^2 area and 0.138cm^3 volume. There is Fat Layer (Subcutaneous Tissue) exposed. There is no tunneling noted, however, there is undermining starting at 10:00 and ending at 11:00 with a maximum distance of 1.3cm.  There is a medium amount of serosanguineous drainage noted. The wound margin is distinct with the outline attached to the wound base. There is large (67-100%) red granulation within the wound bed. There is no necrotic tissue within the wound bed. The periwound skin appearance had no abnormalities noted for texture. The periwound skin appearance had no abnormalities noted for moisture. The periwound skin appearance exhibited: Hemosiderin Staining. Periwound temperature was noted as No Abnormality. Wound #3 status is Open. Original cause of wound was Trauma. The date acquired was: 09/20/2022. The wound has been in treatment 5 weeks. The wound is located on the Left,Posterior Lower Leg. The wound measures 0.4cm length x 0.7cm width x 0.1cm depth; 0.22cm^2 area and 0.022cm^3 volume. There is Fat Layer (Subcutaneous Tissue) exposed. There is no tunneling or undermining noted. There is a medium amount of serosanguineous drainage noted. The wound margin is flat and intact. There is large (67-100%) red granulation within the wound bed. There is no necrotic tissue within the wound bed. The periwound skin appearance had no abnormalities noted for texture. The periwound skin appearance had no abnormalities noted for moisture. The periwound skin appearance exhibited: Hemosiderin Staining. Periwound temperature was noted as No Abnormality. [place], [place] ([id number]) [id number] Page 6 of 8 Assessment Active Problems ICD-10 Non-pressure chronic ulcer of other part of left lower leg with fat layer exposed Type 2 diabetes mellitus with other skin ulcer Long term (current) use of anticoagulants Chronic combined systolic (congestive) and diastolic (congestive) heart failure Chronic kidney disease, stage 3 unspecified Plan Follow-up Appointments: Return Appointment in 1 week. - Dr. Lady Gary - RM 1 Tues 9/3 at 09:00 AM Nurse Visit: - RM 1 Thurs 8/29 @ 07:30 am Anesthetic: (In clinic)  Topical Lidocaine 4% applied to wound bed Bathing/ Shower/ Hygiene: May shower with protection but do not get wound dressing(s) wet. Protect dressing(s) with water repellant cover (for example, large plastic bag) or a cast cover  and may then take shower. Negative Presssure Wound Therapy: Medela Wound Vac continuously at 166mm/hg - bridge wound 1 and 2 together Black Foam Edema Control - Lymphedema / SCD / Other: Elevate legs to the level of the heart or above for 30 minutes daily and/or when sitting for 3-4 times a day throughout the day. Avoid standing for long periods of time. Exercise regularly WOUND #1: - Lower Leg Wound Laterality: Left, Lateral Cleanser: Soap and Water 2 x Per Week/30 Days Discharge Instructions: May shower and wash wound with dial antibacterial soap and water prior to dressing change. Cleanser: Vashe 5.8 (oz) 2 x Per Week/30 Days Discharge Instructions: Cleanse the wound with Vashe prior to applying a clean dressing using gauze sponges, not tissue or cotton balls. Prim Dressing: Promogran Prisma Matrix, 4.34 (sq in) (silver collagen) 2 x Per Week/30 Days ary Discharge Instructions: Moisten collagen with saline and place at base of tunnel Prim Dressing: NPWT 2 x Per Week/30 Days ary Com pression Wrap: Kerlix Roll 4.5x3.1 (in/yd) 2 x Per Week/30 Days Discharge Instructions: Apply Kerlix and Coban compression as directed. Com pression Wrap: Coban Self-Adherent Wrap 4x5 (in/yd) 2 x Per Week/30 Days Discharge Instructions: Apply over Kerlix as directed. WOUND #3: - Lower Leg Wound Laterality: Left, Posterior Cleanser: Soap and Water 2 x Per Week/30 Days Discharge Instructions: May shower and wash wound with dial antibacterial soap and water prior to dressing change. Cleanser: Vashe 5.8 (oz) 2 x Per Week/30 Days Discharge Instructions: Cleanse the wound with Vashe prior to applying a clean dressing using gauze sponges, not tissue or cotton balls. Prim Dressing:  Promogran Prisma Matrix, 4.34 (sq in) (silver collagen) 2 x Per Week/30 Days ary Discharge Instructions: Moisten collagen with saline or hydrogel Secondary Dressing: Woven Gauze Sponges 2x2 in 2 x Per Week/30 Days Discharge Instructions: Apply over primary dressing as directed. Com pression Wrap: Kerlix Roll 4.5x3.1 (in/yd) 2 x Per Week/30 Days Discharge Instructions: Apply Kerlix and Coban compression as directed. Com pression Wrap: Coban Self-Adherent Wrap 4x5 (in/yd) 2 x Per Week/30 Days Discharge Instructions: Apply over Kerlix as directed. 10/30/2022: The posterior wound is nearly closed. The anterior wound had a little bit of oozing but not to the degree as last week. The tunnel is about the same depth while the orifice has contracted a little bit. No concern for infection. No debridement was necessary today. We are going to put some Prisma silver collagen in the base of the tunnel and on the posterior wound. Continue negative pressure wound therapy on the anterior wound. Follow-up in 1 week. Electronic Signature(s) Signed: 10/30/2022 9:22:20 AM By: Duanne Guess MD FACS Entered By: Duanne Guess on 10/30/2022 06:22:20 [name] Long ([id number]) [id number] Page 7 of 8 -------------------------------------------------------------------------------- HxROS Details Patient Name: Date of Service: [name] Long, [name] Long 10/30/2022 8:15 A M Medical Record Number: [id number] Patient Account Number: [id number] Date of Birth/Sex: Treating RN: [birth year] (82 y.o. F) Primary Care Provider: PA Zenovia Jordan, NO Other Clinician: Referring Provider: Treating Provider/Extender: Sherryl Manges in Treatment: 7 Information Obtained From Patient Cardiovascular Medical History: Positive for: Arrhythmia - A fib; Congestive Heart Failure; Hypertension Past Medical History Notes: High Cholesterol Endocrine Medical History: Positive for: Type II Diabetes - since  2019 Genitourinary Medical History: Past Medical History Notes: Kidney Disease Immunizations Pneumococcal Vaccine: Received Pneumococcal Vaccination: No Implantable Devices No devices added Family and Social History Hereditary Spherocytosis: No; Hypertension: No; Kidney Disease: No; Lung Disease: No; Seizures: No; Stroke: No; Thyroid Problems: No; Tuberculosis: No; Never smoker; Marital  Status - Widowed; Alcohol Use: Never; Drug Use: No History; Caffeine Use: Never; Financial Concerns: No; Food, Clothing or Shelter Needs: No; Support System Lacking: No; Transportation Concerns: No Electronic Signature(s) Signed: 10/30/2022 11:49:36 AM By: Duanne Guess MD FACS Entered By: Duanne Guess on 10/30/2022 06:19:45 -------------------------------------------------------------------------------- SuperBill Details Patient Name: Date of Service: [name] Long, [name] Long 10/30/2022 Medical Record Number: [id number] Patient Account Number: [id number] Date of Birth/Sex: Treating RN: Apr 18, 1940 (82 y.o. 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 Z79.01 Long term (current) use of anticoagulants I50.42 Chronic combined systolic (congestive) and diastolic (congestive) heart failure [place], [employer] [place] ([id number]) [id number] Page 8 of 8 N18.30 Chronic kidney disease, stage 3 unspecified Facility Procedures : CPT4 Code: [id number] Description: 97605 - WOUND VAC-50 SQ CM OR LESS Modifier: Quantity: 1 Physician Procedures : CPT4 Code Description Modifier [id number] - WC PHYS LEVEL 4 - EST PT ICD-10 Diagnosis Description L97.822 Non-pressure chronic ulcer of other part of left  lower leg with fat layer exposed E11.622 Type 2 diabetes mellitus with other skin ulcer Z79.01  Long term (current) use of anticoagulants I50.42 Chronic combined systolic (congestive) and diastolic (congestive) heart failure Quantity: 1 Electronic Signature(s) Signed: 10/30/2022 9:22:39 AM By: Duanne Guess MD FACS Entered By: Duanne Guess on 10/30/2022 06:22:39

## 2022-10-31 NOTE — Progress Notes (Signed)
[place], [place] ([id number]) [id number] Page 1 of 9 Visit Report for 10/30/2022 Arrival Information Details Patient Name: Date of Service: [name] Long, [name] Long 10/30/2022 8:15 A M Medical Record Number: [id number] Patient Account Number: [id number] Date of Birth/Sex: Treating RN: [birth year] (82 y.o. [name] Long, [name] Long Primary Care Yara Tomkinson: PA Zenovia Jordan, [place] Other Clinician: Referring Ramzey Petrovic: Treating Chyanne Kohut/Extender: Sherryl Manges in Treatment: 7 Visit Information History Since Last Visit Added or deleted any medications: No Patient Arrived: Walker Any new allergies or adverse reactions: No Arrival Time: 08:38 Had a fall or experienced change in No Accompanied By: daughter activities of daily living that may affect Transfer Assistance: None risk of falls: Patient Identification Verified: Yes Signs or symptoms of abuse/neglect since last visito No Secondary Verification Process Completed: Yes Hospitalized since last visit: No Patient Requires Transmission-Based Precautions: No Implantable device outside of the clinic excluding No Patient Has Alerts: No cellular tissue based products placed in the center since last visit: Has Dressing in Place as Prescribed: Yes Has Compression in Place as Prescribed: Yes Pain Present Now: No Electronic Signature(s) Signed: 10/30/2022 5:14:12 PM By: [name] RN, BSN Entered By: [name] on 10/30/2022 05:38:40 -------------------------------------------------------------------------------- Encounter Discharge Information Details Patient Name: Date of Service: [name] Long, [name] Long 10/30/2022 8:15 A M Medical Record Number: [id number] Patient Account Number: [id number] Date of Birth/Sex: Treating RN: 03/04/1941 (82 y.o. [name] Lua Feng: PA Zenovia Jordan, [place] Other Clinician: Referring Loy Little: Treating Ellery Tash/Extender: Sherryl Manges in Treatment: 7 Encounter Discharge Information  Items Discharge Condition: Stable Ambulatory Status: Walker Discharge Destination: Home Transportation: Private Auto Accompanied By: daughter Schedule Follow-up Appointment: Yes Clinical Summary of Care: Patient Declined Electronic Signature(s) Signed: 10/30/2022 5:14:12 PM By: [name] RN, BSN Entered By: [name] on 10/30/2022 06:17:30 Stuart, [name] ([id number]) [id number] Page 2 of 9 -------------------------------------------------------------------------------- Lower Extremity Assessment Details Patient Name: Date of Service: [name] Long, [name] Long 10/30/2022 8:15 A M Medical Record Number: [id number] Patient Account Number: [id number] Date of Birth/Sex: Treating RN: October 10, 1940 (82 y.o. [name] Heath Badon: PA Zenovia Jordan, [place] Other Clinician: Referring Katty [name] Long: Treating Lonnie Reth/Extender: Sherryl Manges in Treatment: 7 Edema Assessment Assessed: [Left: No] [Right: No] Edema: [Left: Ye] [Right: s] Calf Left: Right: Point of Measurement: From Medial Instep 32 cm Ankle Left: Right: Point of Measurement: From Medial Instep 23.5 cm Vascular Assessment Pulses: Dorsalis Pedis Palpable: [Left:Yes] Extremity colors, hair growth, and conditions: Extremity Color: [Left:Hyperpigmented] Hair Growth on Extremity: [Left:No] Temperature of Extremity: [Left:Warm < 3 seconds] Electronic Signature(s) Signed: 10/30/2022 5:14:12 PM By: [name] RN, BSN Entered By: [name] on 10/30/2022 05:43:47 -------------------------------------------------------------------------------- Multi Wound Chart Details Patient Name: Date of Service: [name] Long, [name] Long 10/30/2022 8:15 A M Medical Record Number: [id number] Patient Account Number: [id number] Date of Birth/Sex: Treating RN: May 14, 1940 (82 y.o. F) Primary Care Jamaine Quintin: PA TIENT, NO Other Clinician: Referring Elzie Knisley: Treating Andrika Peraza/Extender: Sherryl Manges in Treatment:  7 Vital Signs Height(in): 64 Pulse(bpm): 54 Weight(lbs): 162 Blood Pressure(mmHg): 155/73 Body Mass Index(BMI): 27.8 Temperature(F): 98.1 Respiratory Rate(breaths/min): 18 [1:Photos:] [N/A:N/A [id number] Page 3 of 9] Left, Lateral Lower Leg Left, Posterior Lower Leg N/A Wound Location: Hematoma Trauma N/A Wounding Event: Diabetic Wound/Ulcer of the Lower Infection - not elsewhere classified N/A Primary Etiology: Extremity Arrhythmia, Congestive Heart Failure, Arrhythmia, Congestive Heart Failure, N/A Comorbid History: Hypertension, Type II Diabetes Hypertension, Type II Diabetes 08/05/2022 09/20/2022 N/A Date Acquired: 7 5 N/A Weeks of Treatment: Open Open N/A Wound Status: No No N/A Wound Recurrence: Yes No N/A Clustered Wound: 2.2x0.8x0.1  0.4x0.7x0.1 N/A Measurements L x W x D (cm) 1.382 0.22 N/A A (cm) : rea 0.138 0.022 N/A Volume (cm) : 96.10% 94.90% N/A % Reduction in A rea: 99.60% 94.90% N/A % Reduction in Volume: 10 Starting Position 1 (o'clock): 11 Ending Position 1 (o'clock): 1.3 Maximum Distance 1 (cm): Yes No N/A Undermining: Grade 2 Full Thickness Without Exposed N/A Classification: Support Structures Medium Medium N/A Exudate Amount: Serosanguineous Serosanguineous N/A Exudate Type: red, brown red, brown N/A Exudate Color: Distinct, outline attached Flat and Intact N/A Wound Margin: Large (67-100%) Large (67-100%) N/A Granulation Amount: Red Red N/A Granulation Quality: None Present (0%) None Present (0%) N/A Necrotic Amount: Fat Layer (Subcutaneous Tissue): Yes Fat Layer (Subcutaneous Tissue): Yes N/A Exposed Structures: Fascia: No Fascia: No Tendon: No Tendon: No Muscle: No Muscle: No Joint: No Joint: No Bone: No Bone: No Small (1-33%) Medium (34-66%) N/A Epithelialization: No Abnormalities Noted No Abnormalities Noted N/A Periwound Skin Texture: No Abnormalities Noted No Abnormalities Noted  N/A Periwound Skin Moisture: Hemosiderin Staining: Yes Hemosiderin Staining: Yes N/A Periwound Skin Color: No Abnormality No Abnormality N/A Temperature: Treatment Notes Electronic Signature(s) Signed: 10/30/2022 9:00:55 AM By: Duanne Guess MD FACS Entered By: Duanne Guess on 10/30/2022 06:00:55 -------------------------------------------------------------------------------- Multi-Disciplinary Care Plan Details Patient Name: Date of Service: [name] Long, Rachele 10/30/2022 8:15 A M Medical Record Number: [id number] Patient Account Number: [id number] Date of Birth/Sex: Treating RN: 27-Feb-1941 (82 y.o. [name] Jimmie Rueter: PA Zenovia Jordan, [place] Other Clinician: Referring Teyah Rossy: Treating Izik Bingman/Extender: Sherryl Manges in Treatment: 7 Multidisciplinary Care Plan reviewed with physician Active Inactive Venous Leg Ulcer Nursing Diagnoses: Knowledge deficit related to disease process and management SYNAI, MAGNER ([id number]) [id number] Page 4 of 9 Potential for venous Insuffiency (use before diagnosis confirmed) Goals: Patient will maintain optimal edema control Date Initiated: 10/19/2022 Target Resolution Date: 11/16/2022 Goal Status: Active Interventions: Assess peripheral edema status every visit. Compression as ordered Treatment Activities: Therapeutic compression applied : 10/19/2022 Notes: Wound/Skin Impairment Nursing Diagnoses: Impaired tissue integrity Goals: Patient/caregiver will verbalize understanding of skin care regimen Date Initiated: 09/06/2022 Target Resolution Date: 11/04/2022 Goal Status: Active Interventions: Assess patient/caregiver ability to obtain necessary supplies Assess patient/caregiver ability to perform ulcer/skin care regimen upon admission and as needed Assess ulceration(s) every visit Provide education on ulcer and skin care Screen for HBO Treatment Activities: Skin care regimen initiated :  09/06/2022 Topical wound management initiated : 09/06/2022 Notes: Electronic Signature(s) Signed: 10/30/2022 5:14:12 PM By: [name] RN, BSN Entered By: [name] on 10/30/2022 05:53:56 -------------------------------------------------------------------------------- Negative Pressure Wound Therapy Maintenance (NPWT) Details Patient Name: Date of Service: [name] Long, [name] Long 10/30/2022 8:15 A M Medical Record Number: [id number] Patient Account Number: [id number] Date of Birth/Sex: Treating RN: Apr 11, 1940 (82 y.o. [name] Hawk Mones: PA Zenovia Jordan, [place] Other Clinician: Referring Daisey Caloca: Treating Avin Upperman/Extender: Sherryl Manges in Treatment: 7 NPWT Maintenance Performed for: Wound #1 Left, Lateral Lower Leg Additional Injuries Covered: No Performed By: [name], RN Type: Medela [employer] Coverage Size (sq cm): 2.04 Pressure Type: Constant Pressure Setting: 125 mmHG Drain Type: None Primary Contact: Other : silver collagen Sponge/Dressing Type: Foam- Black Date Initiated: 09/20/2022 Dressing Removed: Yes Quantity of Sponges/Gauze Removed: 1 Canister Changed: No Canister Exudate Volume: 0 Dressing Reapplied: Yes Quantity of Sponges/Gauze Inserted: 1 Respones T Treatmentragad rothrock, [place] ([id number]) [id number] Page 5 of 9 Days On NPWT: 41 Post Procedure Diagnosis Same as Pre-procedure Electronic Signature(s) Signed: 10/30/2022 5:14:12 PM By: [name] RN, BSN Entered By: [name] on 10/30/2022  06:16:48 -------------------------------------------------------------------------------- Pain Assessment Details Patient Name: Date of Service: [name] Long, [name] Long 10/30/2022 8:15 A M Medical Record Number: [id number] Patient Account Number: [id number] Date of Birth/Sex: Treating RN: 05/03/1940 (82 y.o. [name] Karne Ozga: PA Zenovia Jordan, [place] Other Clinician: Referring Kianna Billet: Treating  Laronda Lisby/Extender: Sherryl Manges in Treatment: 7 Active Problems Location of Pain Severity and Description of Pain Patient Has Paino No Site Locations Rate the pain. Current Pain Level: 0 Pain Management and Medication Current Pain Management: Electronic Signature(s) Signed: 10/30/2022 5:14:12 PM By: [name] RN, BSN Entered By: [name] on 10/30/2022 05:43:35 -------------------------------------------------------------------------------- Patient/Caregiver Education Details Patient Name: Date of Service: [name] Long, Tima 8/26/2024andnbsp8:15 A M Medical Record Number: [id number] Patient Account Number: [id number] Date of Birth/Gender: Treating RN: 1940/07/03 (82 y.o. [name] Physician: PA Zenovia Jordan, [place] Other Clinician: Referring Physician: Treating Physician/Extender: Sherryl Manges in Treatment: 7 [place], [place] ([id number]) [id number] Page 6 of 9 Education Assessment Education Provided To: Patient Education Topics Provided Venous: Methods: Explain/Verbal Responses: Reinforcements needed, State content correctly Wound/Skin Impairment: Methods: Explain/Verbal Responses: Reinforcements needed, State content correctly Electronic Signature(s) Signed: 10/30/2022 5:14:12 PM By: [name] RN, BSN Entered By: [name] on 10/30/2022 05:54:17 -------------------------------------------------------------------------------- Wound Assessment Details Patient Name: Date of Service: [name] Long, [name] Long 10/30/2022 8:15 A M Medical Record Number: [id number] Patient Account Number: [id number] Date of Birth/Sex: Treating RN: 12/11/1940 (82 y.o. [name] Ahlivia Salahuddin: PA Zenovia Jordan, [place] Other Clinician: Referring Courteny Egler: Treating Renato Spellman/Extender: Sherryl Manges in Treatment: 7 Wound Status Wound Number: 1 Primary Diabetic Wound/Ulcer of the Lower Extremity Etiology: Wound Location: Left,  Lateral Lower Leg Wound Status: Open Wounding Event: Hematoma Comorbid Arrhythmia, Congestive Heart Failure, Hypertension, Type II Date Acquired: 08/05/2022 History: Diabetes Weeks Of Treatment: 7 Clustered Wound: Yes Photos Wound Measurements Length: (cm) 2.2 Width: (cm) 0.8 Depth: (cm) 0.1 Area: (cm) 1.382 Volume: (cm) 0.138 % Reduction in Area: 96.1% % Reduction in Volume: 99.6% Epithelialization: Small (1-33%) Tunneling: No Undermining: Yes Starting Position (o'clock): 10 Ending Position (o'clock): 11 Maximum Distance: (cm) 1.3 Wound Description Classification: Grade 2 Wound Margin: Distinct, outline attached Singley, Devone ([id number]) Exudate Amount: Medium Exudate Type: Serosanguineous Exudate Color: red, brown Foul Odor After Cleansing: No Slough/Fibrino No [phone].pdf Page 7 of 9 Wound Bed Granulation Amount: Large (67-100%) Exposed Structure Granulation Quality: Red Fascia Exposed: No Necrotic Amount: None Present (0%) Fat Layer (Subcutaneous Tissue) Exposed: Yes Tendon Exposed: No Muscle Exposed: No Joint Exposed: No Bone Exposed: No Periwound Skin Texture Texture Color No Abnormalities Noted: Yes No Abnormalities Noted: No Hemosiderin Staining: Yes Moisture No Abnormalities Noted: Yes Temperature / Pain Temperature: No Abnormality Treatment Notes Wound #1 (Lower Leg) Wound Laterality: Left, Lateral Cleanser Soap and Water Discharge Instruction: May shower and wash wound with dial antibacterial soap and water prior to dressing change. Vashe 5.8 (oz) Discharge Instruction: Cleanse the wound with Vashe prior to applying a clean dressing using gauze sponges, not tissue or cotton balls. Peri-Wound Care Topical Primary Dressing Promogran Prisma Matrix, 4.34 (sq in) (silver collagen) Discharge Instruction: Moisten collagen with saline and place at base of tunnel NPWT Secondary Dressing Secured With Compression Wrap Kerlix  Roll 4.5x3.1 (in/yd) Discharge Instruction: Apply Kerlix and Coban compression as directed. Coban Self-Adherent Wrap 4x5 (in/yd) Discharge Instruction: Apply over Kerlix as directed. Compression Stockings Add-Ons Electronic Signature(s) Signed: 10/30/2022 5:14:12 PM By: [name] RN, BSN Entered By: [name] on 10/30/2022 05:52:04 -------------------------------------------------------------------------------- Wound Assessment Details Patient Name: Date of Service: [name] Long, [name] Long  10/30/2022 8:15 A M Medical Record Number: [id number] Patient Account Number: [id number] Date of Birth/Sex: Treating RN: 1940/05/26 (82 y.o. [name] Mylani Gentry: PA Zenovia Jordan, [place] Other Clinician: Referring Emori Kamau: Treating Cinque Begley/Extender: Sherryl Manges in Treatment: 7 Wound Status [place], [place] ([id number]) [id number] Page 8 of 9 Wound Number: 3 Primary Infection - not elsewhere classified Etiology: Wound Location: Left, Posterior Lower Leg Wound Status: Open Wounding Event: Trauma Comorbid Arrhythmia, Congestive Heart Failure, Hypertension, Type II Date Acquired: 09/20/2022 History: Diabetes Weeks Of Treatment: 5 Clustered Wound: No Photos Wound Measurements Length: (cm) 0.4 Width: (cm) 0.7 Depth: (cm) 0.1 Area: (cm) 0.22 Volume: (cm) 0.022 % Reduction in Area: 94.9% % Reduction in Volume: 94.9% Epithelialization: Medium (34-66%) Tunneling: No Undermining: No Wound Description Classification: Full Thickness Without Exposed Support Structures Wound Margin: Flat and Intact Exudate Amount: Medium Exudate Type: Serosanguineous Exudate Color: red, brown Foul Odor After Cleansing: No Slough/Fibrino No Wound Bed Granulation Amount: Large (67-100%) Exposed Structure Granulation Quality: Red Fascia Exposed: No Necrotic Amount: None Present (0%) Fat Layer (Subcutaneous Tissue) Exposed: Yes Tendon Exposed: No Muscle Exposed:  No Joint Exposed: No Bone Exposed: No Periwound Skin Texture Texture Color No Abnormalities Noted: Yes No Abnormalities Noted: No Hemosiderin Staining: Yes Moisture No Abnormalities Noted: Yes Temperature / Pain Temperature: No Abnormality Treatment Notes Wound #3 (Lower Leg) Wound Laterality: Left, Posterior Cleanser Soap and Water Discharge Instruction: May shower and wash wound with dial antibacterial soap and water prior to dressing change. Vashe 5.8 (oz) Discharge Instruction: Cleanse the wound with Vashe prior to applying a clean dressing using gauze sponges, not tissue or cotton balls. Peri-Wound Care Topical Primary Dressing Promogran Prisma Matrix, 4.34 (sq in) (silver collagen) Discharge Instruction: Moisten collagen with saline or hydrogel Secondary Dressing Woven Gauze Sponges 2x2 in Discharge Instruction: Apply over primary dressing as directed. [place], [place] ([id number]) [id number] Page 9 of 9 Secured With Compression Wrap Kerlix Roll 4.5x3.1 (in/yd) Discharge Instruction: Apply Kerlix and Coban compression as directed. Coban Self-Adherent Wrap 4x5 (in/yd) Discharge Instruction: Apply over Kerlix as directed. Compression Stockings Add-Ons Electronic Signature(s) Signed: 10/30/2022 5:14:12 PM By: [name] RN, BSN Entered By: [name] on 10/30/2022 05:52:40 -------------------------------------------------------------------------------- Vitals Details Patient Name: Date of Service: [name] Long, [name] Long 10/30/2022 8:15 A M Medical Record Number: [id number] Patient Account Number: [id number] Date of Birth/Sex: Treating RN: [birth year] (82 y.o. [name] Rosaelena Kemnitz: PA Zenovia Jordan, [place] Other Clinician: Referring Prescious Hurless: Treating Kyiah Canepa/Extender: Sherryl Manges in Treatment: 7 Vital Signs Time Taken: 08:38 Temperature (F): 98.1 Height (in): 64 Pulse (bpm): 54 Weight (lbs): 162 Respiratory Rate  (breaths/min): 18 Body Mass Index (BMI): 27.8 Blood Pressure (mmHg): 155/73 Reference Range: 80 - 120 mg / dl Electronic Signature(s) Signed: 10/30/2022 5:14:12 PM By: [name] RN, BSN Entered By: [name] on 10/30/2022 05:39:51

## 2022-11-02 ENCOUNTER — Encounter (HOSPITAL_BASED_OUTPATIENT_CLINIC_OR_DEPARTMENT_OTHER): Payer: Medicare Other | Admitting: General Surgery

## 2022-11-02 DIAGNOSIS — E11622 Type 2 diabetes mellitus with other skin ulcer: Secondary | ICD-10-CM | POA: Diagnosis not present

## 2022-11-02 NOTE — Progress Notes (Signed)
[place], [place] ([id number]) [id number] Page 1 of 1 Visit Report for 11/02/2022 SuperBill Details Patient Name: Date of Service: [name] Long, [name] Long 11/02/2022 Medical Record Number: [id number] Patient Account Number: [id number] Date of Birth/Sex: Treating RN: 10/07/1940 (82 y.o. 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

## 2022-11-02 NOTE — Progress Notes (Signed)
[place], [place] ([id number]) [id number] Page 1 of 5 Visit Report for 11/02/2022 Arrival Information Details Patient Name: Date of Service: [name] Long, [name] Long 11/02/2022 7:30 A M Medical Record Number: [id number] Patient Account Number: [id number] Date of Birth/Sex: Treating RN: 1940-10-22 (82 y.o. [name] Long Primary Care Verneice Caspers: PA Zenovia Jordan, NO Other Clinician: Referring Lavalle Skoda: Treating Moe Graca/Extender: Sherryl Manges in Treatment: 8 Visit Information History Since Last Visit Added or deleted any medications: No Patient Arrived: Walker Any new allergies or adverse reactions: No Arrival Time: 07:30 Had a fall or experienced change in No Accompanied By: daughter activities of daily living that may affect Transfer Assistance: None risk of falls: Patient Identification Verified: Yes Signs or symptoms of abuse/neglect since last visito No Secondary Verification Process Completed: Yes Hospitalized since last visit: No Patient Requires Transmission-Based Precautions: No Implantable device outside of the clinic excluding No Patient Has Alerts: No cellular tissue based products placed in the center since last visit: Has Dressing in Place as Prescribed: Yes Has Compression in Place as Prescribed: Yes Pain Present Now: No Electronic Signature(s) Signed: 11/02/2022 8:47:40 AM By: [name] RN, BSN Entered By: [name] on 11/02/2022 07:58:18 -------------------------------------------------------------------------------- Encounter Discharge Information Details Patient Name: Date of Service: [name], Makari 11/02/2022 7:30 A M Medical Record Number: [id number] Patient Account Number: [id number] Date of Birth/Sex: Treating RN: 11-17-1940 (82 y.o. [name] Long Primary Care Floy Riegler: PA Zenovia Jordan, [place] Other Clinician: Referring Lavar Rosenzweig: Treating Honestee Revard/Extender: Sherryl Manges in Treatment: 8 Encounter Discharge Information  Items Discharge Condition: Stable Ambulatory Status: Walker Discharge Destination: Home Transportation: Private Auto Accompanied By: daughter Schedule Follow-up Appointment: Yes Clinical Summary of Care: Patient Declined Electronic Signature(s) Signed: 11/02/2022 8:47:40 AM By: [name] RN, BSN Entered By: [name] on 11/02/2022 08:00:57 [place], [name] ([id number]) [id number] Page 2 of 5 -------------------------------------------------------------------------------- Negative Pressure Wound Therapy Maintenance (NPWT) Details Patient Name: Date of Service: YANAE, Long 11/02/2022 7:30 A M Medical Record Number: [id number] Patient Account Number: [id number] Date of Birth/Sex: Treating RN: 01-24-1941 (82 y.o. [name] Long Primary Care Thurston Brendlinger: PA Zenovia Jordan, [place] Other Clinician: Referring Leyla Soliz: Treating Damarien Nyman/Extender: Sherryl Manges in Treatment: 8 NPWT Maintenance Performed for: Wound #1 Left, Lateral Lower Leg Additional Injuries Covered: No Performed By: [name], RN Type: Med[employer] Coverage Size (sq cm): 1.76 Pressure Type: Constant Pressure Setting: 125 mmHG Drain Type: None Primary Contact: Silver Sponge/Dressing Type: Foam- Black Date Initiated: 09/20/2022 Dressing Removed: Yes Quantity of Sponges/Gauze Removed: 2 Canister Changed: No Canister Exudate Volume: 5 Dressing Reapplied: Yes Respones T Treatment: o good Days On NPWT : 44 Electronic Signature(s) Signed: 11/02/2022 8:47:40 AM By: [name] RN, BSN Entered By: [name] on 11/02/2022 08:00:04 -------------------------------------------------------------------------------- Patient/Caregiver Education Details Patient Name: Date of Service: [name], Guila 8/29/2024andnbsp7:30 A M Medical Record Number: [id number] Patient Account Number: [id number] Date of Birth/Gender: Treating RN: Apr 09, 1940 (82 y.o. [name] Long Primary Care  Physician: PA Zenovia Jordan, [place] Other Clinician: Referring Physician: Treating Physician/Extender: Sherryl Manges in Treatment: 8 Education Assessment Education Provided To: Patient Education Topics Provided Venous: Methods: Explain/Verbal Responses: Reinforcements needed, State content correctly Electronic Signature(s) Signed: 11/02/2022 8:47:40 AM By: [name] RN, BSN Entered By: [name] on 11/02/2022 08:00:42 [place], [name] ([id number]) [id number] Page 3 of 5 -------------------------------------------------------------------------------- Wound Assessment Details Patient Name: Date of Service: [name], Long 11/02/2022 7:30 A M Medical Record Number: [id number] Patient Account Number: [id number] Date of Birth/Sex: Treating RN: 01/17/1941 (82 y.o. [name] Long Primary Care Griffin Dewilde: PA Zenovia Jordan, [place] Other Clinician:  Referring Idabell Picking: Treating Navdeep Fessenden/Extender: Sherryl Manges in Treatment: 8 Wound Status Wound Number: 1 Primary Etiology: Diabetic Wound/Ulcer of the Lower Extremity Wound Location: Left, Lateral Lower Leg Wound Status: Open Wounding Event: Hematoma Date Acquired: 08/05/2022 Weeks Of Treatment: 8 Clustered Wound: Yes Wound Measurements Length: (cm) 2.2 Width: (cm) 0.8 Depth: (cm) 0.1 Area: (cm) 1.382 Volume: (cm) 0.138 % Reduction in Area: 96.1% % Reduction in Volume: 99.6% Wound Description Classification: Grade 2 Exudate Amount: Medium Exudate Type: Serosanguineous Exudate Color: red, brown Periwound Skin Texture Texture Color No Abnormalities Noted: No No Abnormalities Noted: No Moisture No Abnormalities Noted: No Treatment Notes Wound #1 (Lower Leg) Wound Laterality: Left, Lateral Cleanser Soap and Water Discharge Instruction: May shower and wash wound with dial antibacterial soap and water prior to dressing change. Vashe 5.8 (oz) Discharge Instruction: Cleanse the wound with Vashe prior to  applying a clean dressing using gauze sponges, not tissue or cotton balls. Peri-Wound Care Topical Primary Dressing Promogran Prisma Matrix, 4.34 (sq in) (silver collagen) Discharge Instruction: Moisten collagen with saline and place at base of tunnel NPWT Secondary Dressing Secured With Compression Wrap Kerlix Roll 4.5x3.1 (in/yd) Discharge Instruction: Apply Kerlix and Coban compression as directed. Coban Self-Adherent Wrap 4x5 (in/yd) Discharge Instruction: Apply over Kerlix as directed. Compression Stockings Add-Ons [place], [name] ([id number]) [id number] Page 4 of 5 Electronic Signature(s) Signed: 11/02/2022 8:47:40 AM By: [name] RN, BSN Entered By: [name] on 11/02/2022 07:59:06 -------------------------------------------------------------------------------- Wound Assessment Details Patient Name: Date of Service: NAYDEAN, KINNIE 11/02/2022 7:30 A M Medical Record Number: [id number] Patient Account Number: [id number] Date of Birth/Sex: Treating RN: 1940-10-03 (82 y.o. [name] Long Primary Care Yolander Goodie: PA Zenovia Jordan, [place] Other Clinician: Referring Myriam Brandhorst: Treating Kylii Ennis/Extender: Sherryl Manges in Treatment: 8 Wound Status Wound Number: 3 Primary Etiology: Infection - not elsewhere classified Wound Location: Left, Posterior Lower Leg Wound Status: Open Wounding Event: Trauma Date Acquired: 09/20/2022 Weeks Of Treatment: 6 Clustered Wound: No Wound Measurements Length: (cm) 0.4 Width: (cm) 0.7 Depth: (cm) 0.1 Area: (cm) 0.22 Volume: (cm) 0.022 % Reduction in Area: 94.9% % Reduction in Volume: 94.9% Wound Description Classification: Full Thickness Without Exposed Support Exudate Amount: Medium Exudate Type: Serosanguineous Exudate Color: red, brown Structures Periwound Skin Texture Texture Color No Abnormalities Noted: No No Abnormalities Noted: No Moisture No Abnormalities Noted: No Treatment Notes Wound  #3 (Lower Leg) Wound Laterality: Left, Posterior Cleanser Soap and Water Discharge Instruction: May shower and wash wound with dial antibacterial soap and water prior to dressing change. Vashe 5.8 (oz) Discharge Instruction: Cleanse the wound with Vashe prior to applying a clean dressing using gauze sponges, not tissue or cotton balls. Peri-Wound Care Topical Primary Dressing Promogran Prisma Matrix, 4.34 (sq in) (silver collagen) Discharge Instruction: Moisten collagen with saline or hydrogel Secondary Dressing Woven Gauze Sponges 2x2 in Discharge Instruction: Apply over primary dressing as directed. Secured With [employer] 4.5x3.1 (in/yd) [place], Sharrie ([id number]) [id number] Page 5 of 5 Discharge Instruction: Apply Kerlix and Coban compression as directed. Coban Self-Adherent Wrap 4x5 (in/yd) Discharge Instruction: Apply over Kerlix as directed. Compression Stockings Add-Ons Electronic Signature(s) Signed: 11/02/2022 8:47:40 AM By: [name] RN, BSN Entered By: [name] on 11/02/2022 07:59:06

## 2022-11-07 ENCOUNTER — Encounter (HOSPITAL_BASED_OUTPATIENT_CLINIC_OR_DEPARTMENT_OTHER): Payer: Medicare Other | Attending: General Surgery | Admitting: General Surgery

## 2022-11-07 DIAGNOSIS — E1122 Type 2 diabetes mellitus with diabetic chronic kidney disease: Secondary | ICD-10-CM | POA: Diagnosis not present

## 2022-11-07 DIAGNOSIS — N183 Chronic kidney disease, stage 3 unspecified: Secondary | ICD-10-CM | POA: Diagnosis not present

## 2022-11-07 DIAGNOSIS — I4891 Unspecified atrial fibrillation: Secondary | ICD-10-CM | POA: Diagnosis not present

## 2022-11-07 DIAGNOSIS — I130 Hypertensive heart and chronic kidney disease with heart failure and stage 1 through stage 4 chronic kidney disease, or unspecified chronic kidney disease: Secondary | ICD-10-CM | POA: Diagnosis not present

## 2022-11-07 DIAGNOSIS — Z7901 Long term (current) use of anticoagulants: Secondary | ICD-10-CM | POA: Diagnosis not present

## 2022-11-07 DIAGNOSIS — I5042 Chronic combined systolic (congestive) and diastolic (congestive) heart failure: Secondary | ICD-10-CM | POA: Diagnosis not present

## 2022-11-07 DIAGNOSIS — E11622 Type 2 diabetes mellitus with other skin ulcer: Secondary | ICD-10-CM | POA: Diagnosis present

## 2022-11-07 DIAGNOSIS — L97822 Non-pressure chronic ulcer of other part of left lower leg with fat layer exposed: Secondary | ICD-10-CM | POA: Insufficient documentation

## 2022-11-07 NOTE — Progress Notes (Signed)
[place], [place] ([id number]) [id number] Page 1 of 9 Visit Report for 11/07/2022 Arrival Information Details Patient Name: Date of Service: [name] Long, [name] Long 11/07/2022 9:00 A M Medical Record Number: [id number] Patient Account Number: [id number] Date of Birth/Sex: Treating RN: 1940-08-23 (82 y.o. [name], [name] Primary Care Jaymar Loeber: PA Zenovia Jordan, NO Other Clinician: Referring Li Bobo: Treating Jazmene Racz/Extender: Sherryl Manges in Treatment: 8 Visit Information History Since Last Visit Added or deleted any medications: No Patient Arrived: Walker Any new allergies or adverse reactions: No Arrival Time: 09:08 Had a fall or experienced change in No Accompanied By: daughter activities of daily living that may affect Transfer Assistance: None risk of falls: Patient Identification Verified: Yes Signs or symptoms of abuse/neglect since last visito No Secondary Verification Process Completed: Yes Hospitalized since last visit: No Patient Requires Transmission-Based Precautions: No Implantable device outside of the clinic excluding No Patient Has Alerts: No cellular tissue based products placed in the center since last visit: Has Dressing in Place as Prescribed: Yes Has Compression in Place as Prescribed: Yes Pain Present Now: No Electronic Signature(s) Signed: 11/07/2022 5:02:17 PM By: [name] RN, BSN Entered By: [name] on 11/07/2022 09:09:19 -------------------------------------------------------------------------------- Lower Extremity Assessment Details Patient Name: Date of Service: [name] Long, [name] Long 11/07/2022 9:00 A M Medical Record Number: [id number] Patient Account Number: [id number] Date of Birth/Sex: Treating RN: 02/08/1941 (82 y.o. [name] Tamilyn Lupien: PA Zenovia Jordan, [place] Other Clinician: Referring Dorina Ribaudo: Treating Aquiles Ruffini/Extender: Sherryl Manges in Treatment: 8 Edema Assessment Assessed: [Left: No] [Right:  No] Edema: [Left: Ye] [Right: s] Calf Left: Right: Point of Measurement: From Medial Instep 34.5 cm Ankle Left: Right: Point of Measurement: From Medial Instep 24 cm Vascular Assessment Pulses: Dorsalis Pedis Palpable: [Left:No] Donovan, Alyne ([id number]) [Right:[id number] Page 2 of 9] Extremity colors, hair growth, and conditions: Extremity Color: [Left:Hyperpigmented] Hair Growth on Extremity: [Left:No] Temperature of Extremity: [Left:Warm < 3 seconds] Electronic Signature(s) Signed: 11/07/2022 5:02:17 PM By: [name] RN, BSN Entered By: [name] on 11/07/2022 09:14:16 -------------------------------------------------------------------------------- Multi Wound Chart Details Patient Name: Date of Service: [name] Long, [name] Long 11/07/2022 9:00 A M Medical Record Number: [id number] Patient Account Number: [id number] Date of Birth/Sex: Treating RN: 1940/12/26 (82 y.o. [name] Zymiere Trostle: PA Zenovia Jordan, [place] Other Clinician: Referring Jordi Kamm: Treating Nathasha Fiorillo/Extender: Sherryl Manges in Treatment: 8 Vital Signs Height(in): 64 Pulse(bpm): 63 Weight(lbs): 162 Blood Pressure(mmHg): 127/70 Body Mass Index(BMI): 27.8 Temperature(F): 98.5 Respiratory Rate(breaths/min): 18 [1:Photos:] [N/A:N/A] Left, Lateral Lower Leg Left, Posterior Lower Leg N/A Wound Location: Hematoma Trauma N/A Wounding Event: Diabetic Wound/Ulcer of the Lower Infection - not elsewhere classified N/A Primary Etiology: Extremity Arrhythmia, Congestive Heart Failure, Arrhythmia, Congestive Heart Failure, N/A Comorbid History: Hypertension, Type II Diabetes Hypertension, Type II Diabetes 08/05/2022 09/20/2022 N/A Date Acquired: 8 6 N/A Weeks of Treatment: Open Open N/A Wound Status: No No N/A Wound Recurrence: Yes No N/A Clustered Wound: 1.8x0.8x0.1 0.1x0.1x0.1 N/A Measurements L x W x D (cm) 1.131 0.008 N/A A (cm) : rea 0.113 0.001 N/A Volume  (cm) : 96.80% 99.80% N/A % Reduction in A rea: 99.60% 99.80% N/A % Reduction in Volume: 11 Position 1 (o'clock): 0.8 Maximum Distance 1 (cm): Yes No N/A Tunneling: Grade 2 Full Thickness Without Exposed N/A Classification: Support Structures Medium Small N/A Exudate Amount: Serosanguineous Serous N/A Exudate Type: red, brown amber N/A Exudate Color: Distinct, outline attached Flat and Intact N/A Wound Margin: Large (67-100%) None Present (0%) N/A Granulation Amount: Red N/A N/A Granulation Quality: None Present (0%) None Present (0%)  N/A Necrotic Amount: Fat Layer (Subcutaneous Tissue): Yes Fascia: No N/A Exposed Structures: Fascia: No Fat Layer (Subcutaneous Tissue): No Tendon: No Tendon: No Muscle: No Muscle: No Joint: No Joint: No [place], [name] ([id number]) [id number] Page 3 of 9 Bone: No Bone: No Small (1-33%) Large (67-100%) N/A Epithelialization: No Abnormalities Noted No Abnormalities Noted N/A Periwound Skin Texture: No Abnormalities Noted No Abnormalities Noted N/A Periwound Skin Moisture: Hemosiderin Staining: Yes Hemosiderin Staining: Yes N/A Periwound Skin Color: No Abnormality No Abnormality N/A Temperature: N/A wound closed but with blister N/A Assessment Notes: Negative Pressure Wound Therapy N/A N/A Procedures Performed: Maintenance (NPWT) Treatment Notes Electronic Signature(s) Signed: 11/07/2022 9:36:44 AM By: Duanne Guess MD FACS Signed: 11/07/2022 5:02:17 PM By: [name] RN, BSN Entered By: Duanne Guess on 11/07/2022 09:36:44 -------------------------------------------------------------------------------- Multi-Disciplinary Care Plan Details Patient Name: Date of Service: [name] Long, [name] Long 11/07/2022 9:00 A M Medical Record Number: [id number] Patient Account Number: [id number] Date of Birth/Sex: Treating RN: 1941-03-04 (81 y.o. [name] Cheyla Duchemin: PA Zenovia Jordan, [place] Other  Clinician: Referring Shadd Dunstan: Treating Livio Ledwith/Extender: Sherryl Manges in Treatment: 8 Multidisciplinary Care Plan reviewed with physician Active Inactive Venous Leg Ulcer Nursing Diagnoses: Knowledge deficit related to disease process and management Potential for venous Insuffiency (use before diagnosis confirmed) Goals: Patient will maintain optimal edema control Date Initiated: 10/19/2022 Target Resolution Date: 11/16/2022 Goal Status: Active Interventions: Assess peripheral edema status every visit. Compression as ordered Treatment Activities: Therapeutic compression applied : 10/19/2022 Notes: Wound/Skin Impairment Nursing Diagnoses: Impaired tissue integrity Goals: Patient/caregiver will verbalize understanding of skin care regimen Date Initiated: 09/06/2022 Target Resolution Date: 11/16/2022 Goal Status: Active Interventions: Assess patient/caregiver ability to obtain necessary supplies Assess patient/caregiver ability to perform ulcer/skin care regimen upon admission and as needed Assess ulceration(s) every visit Provide education on ulcer and skin care Screen for HBO Hopkins, [name] ([id number]) [id number] Page 4 of 9 Treatment Activities: Skin care regimen initiated : 09/06/2022 Topical wound management initiated : 09/06/2022 Notes: Electronic Signature(s) Signed: 11/07/2022 5:02:17 PM By: [name] RN, BSN Entered By: [name] on 11/07/2022 09:21:56 -------------------------------------------------------------------------------- Negative Pressure Wound Therapy Maintenance (NPWT) Details Patient Name: Date of Service: [name] Long, [name] Long 11/07/2022 9:00 A M Medical Record Number: [id number] Patient Account Number: [id number] Date of Birth/Sex: Treating RN: 04/28/1940 (82 y.o. [name] Skyla Champagne: PA Zenovia Jordan, [place] Other Clinician: Referring Hibah Odonnell: Treating Tavarious Freel/Extender: Sherryl Manges in Treatment:  8 NPWT Maintenance Performed for: Wound #1 Left, Lateral Lower Leg Additional Injuries Covered: No Performed By: [name], RN Coverage Size (sq cm): 1.44 Pressure Type: Constant Pressure Setting: 125 mmHG Drain Type: None Primary Contact: Silver Sponge/Dressing Type: Foam- Black Date Initiated: 09/20/2022 Dressing Removed: Yes Quantity of Sponges/Gauze Removed: 2 Canister Changed: No Canister Exudate Volume: 10 Dressing Reapplied: Yes Quantity of Sponges/Gauze Inserted: 2 Respones T Treatment: o good Days On NPWT : 49 Post Procedure Diagnosis Same as Pre-procedure Electronic Signature(s) Signed: 11/07/2022 5:02:17 PM By: [name] RN, BSN Entered By: [name] on 11/07/2022 09:32:49 -------------------------------------------------------------------------------- Pain Assessment Details Patient Name: Date of Service: [name] Long, [name] Long 11/07/2022 9:00 A M Medical Record Number: [id number] Patient Account Number: [id number] Date of Birth/Sex: Treating RN: [birth year] (82 y.o. [name] Temima Kutsch: PA Zenovia Jordan, [place] Other Clinician: Referring Shakur Lembo: Treating Emmely Bittinger/Extender: Sherryl Manges in Treatment: 8 Active Problems Location of Pain Severity and Description of Pain Patient Has Paino No Site Locations Rate the pain. [place], [place] ([id number]) [id number] Page 5 of 9 Rate the pain. Current Pain Level:  0 Pain Management and Medication Current Pain Management: Electronic Signature(s) Signed: 11/07/2022 5:02:17 PM By: [name] RN, BSN Entered By: [name] on 11/07/2022 09:09:44 -------------------------------------------------------------------------------- Patient/Caregiver Education Details Patient Name: Date of Service: [name] Long, [name] Long 9/3/2024andnbsp9:00 A M Medical Record Number: [id number] Patient Account Number: [id number] Date of Birth/Gender: Treating RN: Apr 19, 1940 (82 y.o. [name] Physician: PA Zenovia Jordan, [place] Other Clinician: Referring Physician: Treating Physician/Extender: Sherryl Manges in Treatment: 8 Education Assessment Education Provided To: Patient Education Topics Provided Venous: Methods: Explain/Verbal Responses: Reinforcements needed, State content correctly Wound/Skin Impairment: Methods: Explain/Verbal Responses: Reinforcements needed, State content correctly Electronic Signature(s) Signed: 11/07/2022 5:02:17 PM By: [name] RN, BSN Entered By: [name] on 11/07/2022 09:22:39 -------------------------------------------------------------------------------- Wound Assessment Details Patient Name: Date of Service: [name] Long, [name] Long 11/07/2022 9:00 A [place], [place] ([id number]) [id number] Page 6 of 9 Medical Record Number: [id number] Patient Account Number: [id number] Date of Birth/Sex: Treating RN: 1940-09-11 (82 y.o. [name] Astra Gregg: PA Zenovia Jordan, [place] Other Clinician: Referring Yuri Fana: Treating Ryana Montecalvo/Extender: Sherryl Manges in Treatment: 8 Wound Status Wound Number: 1 Primary Diabetic Wound/Ulcer of the Lower Extremity Etiology: Wound Location: Left, Lateral Lower Leg Wound Status: Open Wounding Event: Hematoma Comorbid Arrhythmia, Congestive Heart Failure, Hypertension, Type II Date Acquired: 08/05/2022 History: Diabetes Weeks Of Treatment: 8 Clustered Wound: Yes Photos Wound Measurements Length: (cm) 1.8 Width: (cm) 0.8 Depth: (cm) 0.1 Area: (cm) 1.131 Volume: (cm) 0.113 % Reduction in Area: 96.8% % Reduction in Volume: 99.6% Epithelialization: Small (1-33%) Tunneling: Yes Position (o'clock): 11 Maximum Distance: (cm) 0.8 Undermining: No Wound Description Classification: Grade 2 Wound Margin: Distinct, outline attached Exudate Amount: Medium Exudate Type: Serosanguineous Exudate Color: red, brown Foul Odor After Cleansing:  No Slough/Fibrino No Wound Bed Granulation Amount: Large (67-100%) Exposed Structure Granulation Quality: Red Fascia Exposed: No Necrotic Amount: None Present (0%) Fat Layer (Subcutaneous Tissue) Exposed: Yes Tendon Exposed: No Muscle Exposed: No Joint Exposed: No Bone Exposed: No Periwound Skin Texture Texture Color No Abnormalities Noted: Yes No Abnormalities Noted: No Hemosiderin Staining: Yes Moisture No Abnormalities Noted: Yes Temperature / Pain Temperature: No Abnormality Treatment Notes Wound #1 (Lower Leg) Wound Laterality: Left, Lateral Cleanser Soap and Water Discharge Instruction: May shower and wash wound with dial antibacterial soap and water prior to dressing change. Vashe 5.8 (oz) Discharge Instruction: Cleanse the wound with Vashe prior to applying a clean dressing using gauze sponges, not tissue or cotton balls. Peri-Wound Care [place], [place] ([id number]) [id number] Page 7 of 9 Topical Primary Dressing Promogran Prisma Matrix, 4.34 (sq in) (silver collagen) Discharge Instruction: Moisten collagen with saline and place at base of tunnel NPWT Secondary Dressing Secured With Compression Wrap Kerlix Roll 4.5x3.1 (in/yd) Discharge Instruction: Apply Kerlix and Coban compression as directed. Coban Self-Adherent Wrap 4x5 (in/yd) Discharge Instruction: Apply over Kerlix as directed. Compression Stockings Add-Ons Electronic Signature(s) Signed: 11/07/2022 5:02:17 PM By: [name] RN, BSN Entered By: [name] on 11/07/2022 62:95:28 -------------------------------------------------------------------------------- Wound Assessment Details Patient Name: Date of Service: [name] Long, [name] Long 11/07/2022 9:00 A M Medical Record Number: [id number] Patient Account Number: [id number] Date of Birth/Sex: Treating RN: [birth year] (82 y.o. [name] Andilynn Delavega: PA Zenovia Jordan, [place] Other Clinician: Referring Sharone Picchi: Treating  Annella Prowell/Extender: Sherryl Manges in Treatment: 8 Wound Status Wound Number: 3 Primary Infection - not elsewhere classified Etiology: Wound Location: Left, Posterior Lower Leg Wound Status: Open Wounding Event: Trauma Comorbid Arrhythmia, Congestive Heart Failure, Hypertension, Type II Date Acquired: 09/20/2022 History: Diabetes Weeks Of Treatment:  6 Clustered Wound: No Photos Wound Measurements Length: (cm) 0.1 Width: (cm) 0.1 Depth: (cm) 0.1 Area: (cm) 0.008 Volume: (cm) 0.001 % Reduction in Area: 99.8% % Reduction in Volume: 99.8% Epithelialization: Large (67-100%) Tunneling: No Undermining: No Wound Description Classification: Full Thickness Without Exposed Support Structures Wound Margin: Flat and Intact Exudate Amount: Small Lebarron, Elysa ([id number]) Exudate Type: Serous Exudate Color: amber Foul Odor After Cleansing: No Slough/Fibrino No [id number] Page 8 of 9 Wound Bed Granulation Amount: None Present (0%) Exposed Structure Necrotic Amount: None Present (0%) Fascia Exposed: No Fat Layer (Subcutaneous Tissue) Exposed: No Tendon Exposed: No Muscle Exposed: No Joint Exposed: No Bone Exposed: No Periwound Skin Texture Texture Color No Abnormalities Noted: Yes No Abnormalities Noted: No Hemosiderin Staining: Yes Moisture No Abnormalities Noted: Yes Temperature / Pain Temperature: No Abnormality Assessment Notes wound closed but with blister Treatment Notes Wound #3 (Lower Leg) Wound Laterality: Left, Posterior Cleanser Soap and Water Discharge Instruction: May shower and wash wound with dial antibacterial soap and water prior to dressing change. Vashe 5.8 (oz) Discharge Instruction: Cleanse the wound with Vashe prior to applying a clean dressing using gauze sponges, not tissue or cotton balls. Peri-Wound Care Topical Primary Dressing Maxorb Extra Ag+ Alginate Dressing, 2x2 (in/in) Discharge Instruction: Apply to  wound bed as instructed Secondary Dressing Woven Gauze Sponges 2x2 in Discharge Instruction: Apply over primary dressing as directed. Secured With Compression Wrap Kerlix Roll 4.5x3.1 (in/yd) Discharge Instruction: Apply Kerlix and Coban compression as directed. Coban Self-Adherent Wrap 4x5 (in/yd) Discharge Instruction: Apply over Kerlix as directed. Compression Stockings Add-Ons Electronic Signature(s) Signed: 11/07/2022 5:02:17 PM By: [name] RN, BSN Entered By: [name] on 11/07/2022 09:24:01 -------------------------------------------------------------------------------- Vitals Details Patient Name: Date of Service: [name] Long, [name] Long 11/07/2022 9:00 A M Medical Record Number: [id number] Patient Account Number: [id number] Date of Birth/Sex: Treating RN: [birth year] (82 y.o. [name] Munachimso Palin: PA Zenovia Jordan, [place] Other Clinician: Referring Eleno Weimar: Treating Edword Cu/Extender: Duanne Guess [place], [name] ([id number]) [id number] Page 9 of 9 Weeks in Treatment: 8 Vital Signs Time Taken: 09:08 Temperature (F): 98.5 Height (in): 64 Pulse (bpm): 63 Weight (lbs): 162 Respiratory Rate (breaths/min): 18 Body Mass Index (BMI): 27.8 Blood Pressure (mmHg): 127/70 Reference Range: 80 - 120 mg / dl Electronic Signature(s) Signed: 11/07/2022 5:02:17 PM By: [name] RN, BSN Entered By: [name] on 11/07/2022 09:09:38

## 2022-11-07 NOTE — Progress Notes (Signed)
[place], [place] ([id number]) [id number] Page 1 of 8 Visit Report for 11/07/2022 Chief Complaint Document Details Patient Name: Date of Service: [name] Long 11/07/2022 9:00 A M Medical Record Number: [id number] Patient Account Number: [id number] Date of Birth/Sex: Treating RN: October 31, 1940 (82 y.o. [name] Provider: PA Zenovia Jordan, [place] Other Clinician: Referring Provider: Treating Provider/Extender: Sherryl Manges in Treatment: 8 Information Obtained from: Patient Chief Complaint Patient seen for complaints of Non-Healing Wound. Electronic Signature(s) Signed: 11/07/2022 9:36:54 AM By: Duanne Guess MD FACS Entered By: Duanne Guess on 11/07/2022 06:36:54 -------------------------------------------------------------------------------- HPI Details Patient Name: Date of Service: [name] Long, [name] Long 11/07/2022 9:00 A M Medical Record Number: [id number] Patient Account Number: [id number] Date of Birth/Sex: Treating RN: 14-Mar-1940 (82 y.o. [name] Provider: PA Zenovia Jordan, [place] Other Clinician: Referring Provider: Treating Provider/Extender: Sherryl Manges in Treatment: 8 History of Present Illness HPI Description: ADMISSION 09/06/2022 This is an 82 year old type II diabetic (no A1c to review and patient is unaware of her last value) with a history of atrial fibrillation on chronic anticoagulant therapy, lower extremity edema, congestive heart failure, and stage III kidney disease. She divides her time between her home in [place]. [place] and here in [place] where her daughter lives. While in [place], she struck her leg on her bed frame and developed a large hematoma. She was evaluated in the emergency department there and imaging was negative for any deeper injury, such as a fracture. She returned to [facility] and has had an urgent care and emergency department visit to evaluate the site. As there had been no significant  improvement in her wound, she was referred to the wound care center for further evaluation and management. I am unable to see any evidence of an attempt to evacuate the hematoma in the electronic medical record.. There are 2 openings in her skin, but they connect to each other with significant tunneling and undermining. I am unsure if these opened spontaneously or there was a non- documented attempt to drain the hematoma site. The majority of her left lower leg from below the tibial tuberosity to just above the ankle and from the anterior tibia wrapping laterally and posteriorly to nearly the medial aspect of her leg is involved. There is a foul odor coming from her wound. There is no frank pus but there is significant retained clot that has likely served as culture media. ABI in clinic today was 0.84. 09/08/2022: The patient came in today as an add-on due to bloody drainage that was saturating her dressing. On inspection, it appeared that there was no frank fresh blood and no active site of bleeding identified; it appears that more of the hematoma was liquefied and draining from her wound. 09/13/2022: The wound has cleaned up considerably. There is no longer any foul odor and the drainage has decreased significantly. The patient is also complaining of less pain. There is still some nonviable subcutaneous tissue and fat present but overall everything is improved from the last visit. Her culture grew out a polymicrobial population of multiple drug-resistant organisms. She is currently taking Augmentin and levofloxacin as suggested by the culture data. 09/20/2022: The cavity is contracting nicely. There is no longer a connection between the posterior wound and the lateral leg wound. The posterior wound has a little bit of slough on the surface but the larger main wound is very clean. She has been approved for a wound VAC and we will apply that today. 09/25/2022: Post Wound sloughly- alginate Ag.  Vac on lateral  wound; decreased undermining. 10/02/2022: There is a little bit of slough on the posterior wound. The lateral leg wound continues to contract at a remarkable rate. It is clean without any slough or other debris accumulation. 10/09/2022: The posterior wound is about half the size as it was last week. The lateral leg wound has contracted remarkably. There is still some undermining from 9-12 o'clock. Everything is very clean. [place], [place] ([id number]) [id number] Page 2 of 8 10/19/2022: Both wounds have contracted considerably. Once again, the posterior wound is about half the size as it was last week. The tunnel is down to about a centimeter. Everything is extremely clean. Edema control is good. 10/23/2022: The posterior wound continues to contract at a remarkable rate. She has had some bleeding from the anterior wound over the last 24 hours to the extent that it clogged the tubing for her wound VAC.. It appears to be coming from the small residual tunneled portion of the wound. No concern for infection. 10/30/2022: The posterior wound is nearly closed. The anterior wound had a little bit of oozing but not to the degree as last week. The tunnel is about the same depth while the orifice has contracted a little bit. No concern for infection. 11/07/2022: The posterior wound is healed. There is a tiny blister overlying the epithelialized surface. The anterior wound tunnel has contracted substantially. The wound surface is clean and healthy-looking. Electronic Signature(s) Signed: 11/07/2022 9:37:46 AM By: Duanne Guess MD FACS Entered By: Duanne Guess on 11/07/2022 06:37:46 -------------------------------------------------------------------------------- Physical Exam Details Patient Name: Date of Service: [name] Long, [name] Long 11/07/2022 9:00 A M Medical Record Number: [id number] Patient Account Number: [id number] Date of Birth/Sex: Treating RN: Jul 08, 1940 (82 y.o. [name] Provider: PA Zenovia Jordan, [place] Other Clinician: Referring Provider: Treating Provider/Extender: Sherryl Manges in Treatment: 8 Constitutional . . . . no acute distress. Respiratory Normal work of breathing on room air. Notes 11/07/2022: The posterior wound is healed. There is a tiny blister overlying the epithelialized surface. The anterior wound tunnel has contracted substantially. The wound surface is clean and healthy-looking. Electronic Signature(s) Signed: 11/07/2022 9:39:43 AM By: Duanne Guess MD FACS Entered By: Duanne Guess on 11/07/2022 06:39:43 -------------------------------------------------------------------------------- Physician Orders Details Patient Name: Date of Service: [name] Long, [name] Long 11/07/2022 9:00 A M Medical Record Number: [id number] Patient Account Number: [id number] Date of Birth/Sex: Treating RN: 10/11/1940 (82 y.o. [name] Provider: PA Zenovia Jordan, [place] Other Clinician: Referring Provider: Treating Provider/Extender: Sherryl Manges in Treatment: 8 Verbal / Phone Orders: No Diagnosis Coding ICD-10 Coding Code Description [phone] 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 Z79.01 Long term (current) use of anticoagulants I50.42 Chronic combined systolic (congestive) and diastolic (congestive) heart failure N18.30 Chronic kidney disease, stage 3 unspecified [name] Long, [name] Long ([id number]) [id number] Page 3 of 8 Follow-up Appointments ppointment in 1 week. - Dr. Lady Gary - RM 1 Return A Tues 9/9 at07:30AM Nurse Visit: - RM 1 Thurs9/5 @ 07:30 am Anesthetic (In clinic) Topical Lidocaine 4% applied to wound bed Bathing/ Shower/ Hygiene May shower with protection but do not get wound dressing(s) wet. Protect dressing(s) with water repellant cover (for example, large plastic bag) or a cast cover and  may then take shower. Negative Presssure Wound Therapy Medela Wound Vac continuously at 133mm/hg - bridge wound 1 and 2 together Black Foam Edema Control - Lymphedema / SCD / Other Elevate  legs to the level of the heart or above for 30 minutes daily and/or when sitting for 3-4 times a day throughout the day. Avoid standing for long periods of time. Exercise regularly Wound Treatment Wound #1 - Lower Leg Wound Laterality: Left, Lateral Cleanser: Soap and Water 2 x Per Week/30 Days Discharge Instructions: May shower and wash wound with dial antibacterial soap and water prior to dressing change. Cleanser: Vashe 5.8 (oz) 2 x Per Week/30 Days Discharge Instructions: Cleanse the wound with Vashe prior to applying a clean dressing using gauze sponges, not tissue or cotton balls. Prim Dressing: Promogran Prisma Matrix, 4.34 (sq in) (silver collagen) 2 x Per Week/30 Days ary Discharge Instructions: Moisten collagen with saline and place at base of tunnel Prim Dressing: NPWT ary 2 x Per Week/30 Days Compression Wrap: Kerlix Roll 4.5x3.1 (in/yd) 2 x Per Week/30 Days Discharge Instructions: Apply Kerlix and Coban compression as directed. Compression Wrap: Coban Self-Adherent Wrap 4x5 (in/yd) 2 x Per Week/30 Days Discharge Instructions: Apply over Kerlix as directed. Wound #3 - Lower Leg Wound Laterality: Left, Posterior Cleanser: Soap and Water 2 x Per Week/30 Days Discharge Instructions: May shower and wash wound with dial antibacterial soap and water prior to dressing change. Cleanser: Vashe 5.8 (oz) 2 x Per Week/30 Days Discharge Instructions: Cleanse the wound with Vashe prior to applying a clean dressing using gauze sponges, not tissue or cotton balls. Prim Dressing: Maxorb Extra Ag+ Alginate Dressing, 2x2 (in/in) 2 x Per Week/30 Days ary Discharge Instructions: Apply to wound bed as instructed Secondary Dressing: Woven Gauze Sponges 2x2 in 2 x Per Week/30 Days Discharge Instructions: Apply  over primary dressing as directed. Compression Wrap: Kerlix Roll 4.5x3.1 (in/yd) 2 x Per Week/30 Days Discharge Instructions: Apply Kerlix and Coban compression as directed. Compression Wrap: Coban Self-Adherent Wrap 4x5 (in/yd) 2 x Per Week/30 Days Discharge Instructions: Apply over Kerlix as directed. Electronic Signature(s) Signed: 11/07/2022 9:48:01 AM By: Duanne Guess MD FACS Entered By: Duanne Guess on 11/07/2022 06:40:08 Problem List Details -------------------------------------------------------------------------------- [name] Long ([id number]) [id number] Page 4 of 8 Patient Name: Date of Service: [name] Long, [name] Long 11/07/2022 9:00 A M Medical Record Number: [id number] Patient Account Number: [id number] Date of Birth/Sex: Treating RN: Jun 08, 1940 (82 y.o. [name] Provider: PA Zenovia Jordan, [place] Other Clinician: Referring Provider: Treating Provider/Extender: Sherryl Manges in Treatment: 8 Active Problems ICD-10 Encounter Code Description Active Date MDM Diagnosis L97.822 Non-pressure chronic ulcer of other part of left lower leg with fat layer exposed7/05/2022 No Yes E11.622 Type 2 diabetes mellitus with other skin ulcer 09/06/2022 No Yes Z79.01 Long term (current) use of anticoagulants 09/06/2022 No Yes I50.42 Chronic combined systolic (congestive) and diastolic (congestive) heart failure 09/06/2022 No Yes N18.30 Chronic kidney disease, stage 3 unspecified 09/06/2022 No Yes Inactive Problems Resolved Problems Electronic Signature(s) Signed: 11/07/2022 9:36:34 AM By: Duanne Guess MD FACS Entered By: Duanne Guess on 11/07/2022 06:36:34 -------------------------------------------------------------------------------- Progress Note Details Patient Name: Date of Service: [name] Long, [name] Long 11/07/2022 9:00 A M Medical Record Number: [id number] Patient Account Number: [id number] Date of Birth/Sex: Treating RN: 21-Jan-1941 (82 y.o. [name] Provider: PA Zenovia Jordan, [place] Other Clinician: Referring Provider: Treating Provider/Extender: Sherryl Manges in Treatment: 8 Subjective Chief Complaint Information obtained from Patient Patient seen for complaints of Non-Healing Wound. History of Present Illness (HPI) ADMISSION 09/06/2022 This is an 82 year old type II diabetic (no A1c to review and patient is unaware of her last value) with a history of atrial fibrillation on chronic anticoagulant  therapy, lower extremity edema, congestive heart failure, and stage III kidney disease. She divides her time between her home in [place]. [place] and here in [place] where her daughter lives. While in [place], she struck her leg on her bed frame and developed a large hematoma. She was evaluated in the emergency department there and imaging was negative for any deeper injury, such as a fracture. She returned to [facility] and has had an urgent care and emergency department visit to evaluate the site. As there had been no significant improvement in her wound, she was referred to the wound care center for further evaluation and management. I am unable to see any evidence of an attempt to evacuate the hematoma in the electronic medical record.. There are 2 openings in her skin, but they connect to each other with significant tunneling and undermining. I am unsure if these opened spontaneously or there was a non- documented attempt to drain the hematoma site. The majority of her left lower leg from below the tibial tuberosity to just above the ankle and from the anterior [place], [place] ([id number]) [id number] Page 5 of 8 tibia wrapping laterally and posteriorly to nearly the medial aspect of her leg is involved. There is a foul odor coming from her wound. There is no frank pus but there is significant retained clot that has likely served as culture media. ABI in clinic today was 0.84. 09/08/2022: The  patient came in today as an add-on due to bloody drainage that was saturating her dressing. On inspection, it appeared that there was no frank fresh blood and no active site of bleeding identified; it appears that more of the hematoma was liquefied and draining from her wound. 09/13/2022: The wound has cleaned up considerably. There is no longer any foul odor and the drainage has decreased significantly. The patient is also complaining of less pain. There is still some nonviable subcutaneous tissue and fat present but overall everything is improved from the last visit. Her culture grew out a polymicrobial population of multiple drug-resistant organisms. She is currently taking Augmentin and levofloxacin as suggested by the culture data. 09/20/2022: The cavity is contracting nicely. There is no longer a connection between the posterior wound and the lateral leg wound. The posterior wound has a little bit of slough on the surface but the larger main wound is very clean. She has been approved for a wound VAC and we will apply that today. 09/25/2022: Post Wound sloughly- alginate Ag. Vac on lateral wound; decreased undermining. 10/02/2022: There is a little bit of slough on the posterior wound. The lateral leg wound continues to contract at a remarkable rate. It is clean without any slough or other debris accumulation. 10/09/2022: The posterior wound is about half the size as it was last week. The lateral leg wound has contracted remarkably. There is still some undermining from 9-12 o'clock. Everything is very clean. 10/19/2022: Both wounds have contracted considerably. Once again, the posterior wound is about half the size as it was last week. The tunnel is down to about a centimeter. Everything is extremely clean. Edema control is good. 10/23/2022: The posterior wound continues to contract at a remarkable rate. She has had some bleeding from the anterior wound over the last 24 hours to the extent that it  clogged the tubing for her wound VAC.. It appears to be coming from the small residual tunneled portion of the wound. No concern for infection. 10/30/2022: The posterior wound is nearly closed. The anterior wound had  a little bit of oozing but not to the degree as last week. The tunnel is about the same depth while the orifice has contracted a little bit. No concern for infection. 11/07/2022: The posterior wound is healed. There is a tiny blister overlying the epithelialized surface. The anterior wound tunnel has contracted substantially. The wound surface is clean and healthy-looking. Patient History Information obtained from Patient. Family History No family history of Hereditary Spherocytosis, Hypertension, Kidney Disease, Lung Disease, Seizures, Stroke, Thyroid Problems, Tuberculosis. Social History Never smoker, Marital Status - Widowed, Alcohol Use - Never, Drug Use - No History, Caffeine Use - Never. Medical History Cardiovascular Patient has history of Arrhythmia - A fib, Congestive Heart Failure, Hypertension Endocrine Patient has history of Type II Diabetes - since 2019 Medical A Surgical History Notes nd Cardiovascular High Cholesterol Genitourinary Kidney Disease Objective Constitutional no acute distress. Vitals Time Taken: 9:08 AM, Height: 64 in, Weight: 162 lbs, BMI: 27.8, Temperature: 98.5 F, Pulse: 63 bpm, Respiratory Rate: 18 breaths/min, Blood Pressure: 127/70 mmHg. Respiratory Normal work of breathing on room air. General Notes: 11/07/2022: The posterior wound is healed. There is a tiny blister overlying the epithelialized surface. The anterior wound tunnel has contracted substantially. The wound surface is clean and healthy-looking. Integumentary (Hair, Skin) Wound #1 status is Open. Original cause of wound was Hematoma. The date acquired was: 08/05/2022. The wound has been in treatment 8 weeks. The wound is located on the Left,Lateral Lower Leg. The wound measures  1.8cm length x 0.8cm width x 0.1cm depth; 1.131cm^2 area and 0.113cm^3 volume. There is Fat Layer (Subcutaneous Tissue) exposed. There is no undermining noted, however, there is tunneling at 11:00 with a maximum distance of 0.8cm. There is a medium amount of serosanguineous drainage noted. The wound margin is distinct with the outline attached to the wound base. There is large (67-100%) red granulation within the wound bed. There is no necrotic tissue within the wound bed. The periwound skin appearance had no abnormalities noted for texture. The periwound skin appearance had no abnormalities noted for moisture. The periwound skin appearance exhibited: Hemosiderin Staining. Periwound temperature was noted as No Abnormality. Wound #3 status is Open. Original cause of wound was Trauma. The date acquired was: 09/20/2022. The wound has been in treatment 6 weeks. The wound is [place], [name] ([id number]) [id number] Page 6 of 8 located on the Left,Posterior Lower Leg. The wound measures 0.1cm length x 0.1cm width x 0.1cm depth; 0.008cm^2 area and 0.001cm^3 volume. There is no tunneling or undermining noted. There is a small amount of serous drainage noted. The wound margin is flat and intact. There is no granulation within the wound bed. There is no necrotic tissue within the wound bed. The periwound skin appearance had no abnormalities noted for texture. The periwound skin appearance had no abnormalities noted for moisture. The periwound skin appearance exhibited: Hemosiderin Staining. Periwound temperature was noted as No Abnormality. General Notes: wound closed but with blister Assessment Active Problems ICD-10 Non-pressure chronic ulcer of other part of left lower leg with fat layer exposed Type 2 diabetes mellitus with other skin ulcer Long term (current) use of anticoagulants Chronic combined systolic (congestive) and diastolic (congestive) heart failure Chronic kidney  disease, stage 3 unspecified Plan Follow-up Appointments: Return Appointment in 1 week. - Dr. Lady Gary - RM 1 Tues 9/9 at07:30AM Nurse Visit: - RM 1 Thurs9/5 @ 07:30 am Anesthetic: (In clinic) Topical Lidocaine 4% applied to wound bed Bathing/ Shower/ Hygiene: May shower with protection but do not get  wound dressing(s) wet. Protect dressing(s) with water repellant cover (for example, large plastic bag) or a cast cover and may then take shower. Negative Presssure Wound Therapy: Medela Wound Vac continuously at 133mm/hg - bridge wound 1 and 2 together Black Foam Edema Control - Lymphedema / SCD / Other: Elevate legs to the level of the heart or above for 30 minutes daily and/or when sitting for 3-4 times a day throughout the day. Avoid standing for long periods of time. Exercise regularly WOUND #1: - Lower Leg Wound Laterality: Left, Lateral Cleanser: Soap and Water 2 x Per Week/30 Days Discharge Instructions: May shower and wash wound with dial antibacterial soap and water prior to dressing change. Cleanser: Vashe 5.8 (oz) 2 x Per Week/30 Days Discharge Instructions: Cleanse the wound with Vashe prior to applying a clean dressing using gauze sponges, not tissue or cotton balls. Prim Dressing: Promogran Prisma Matrix, 4.34 (sq in) (silver collagen) 2 x Per Week/30 Days ary Discharge Instructions: Moisten collagen with saline and place at base of tunnel Prim Dressing: NPWT 2 x Per Week/30 Days ary Com pression Wrap: Kerlix Roll 4.5x3.1 (in/yd) 2 x Per Week/30 Days Discharge Instructions: Apply Kerlix and Coban compression as directed. Com pression Wrap: Coban Self-Adherent Wrap 4x5 (in/yd) 2 x Per Week/30 Days Discharge Instructions: Apply over Kerlix as directed. WOUND #3: - Lower Leg Wound Laterality: Left, Posterior Cleanser: Soap and Water 2 x Per Week/30 Days Discharge Instructions: May shower and wash wound with dial antibacterial soap and water prior to dressing change. Cleanser:  Vashe 5.8 (oz) 2 x Per Week/30 Days Discharge Instructions: Cleanse the wound with Vashe prior to applying a clean dressing using gauze sponges, not tissue or cotton balls. Prim Dressing: Maxorb Extra Ag+ Alginate Dressing, 2x2 (in/in) 2 x Per Week/30 Days ary Discharge Instructions: Apply to wound bed as instructed Secondary Dressing: Woven Gauze Sponges 2x2 in 2 x Per Week/30 Days Discharge Instructions: Apply over primary dressing as directed. Com pression Wrap: Kerlix Roll 4.5x3.1 (in/yd) 2 x Per Week/30 Days Discharge Instructions: Apply Kerlix and Coban compression as directed. Com pression Wrap: Coban Self-Adherent Wrap 4x5 (in/yd) 2 x Per Week/30 Days Discharge Instructions: Apply over Kerlix as directed. 11/07/2022: The posterior wound is healed. There is a tiny blister overlying the epithelialized surface. The anterior wound tunnel has contracted substantially. The wound surface is clean and healthy-looking. No debridement was necessary today. Will continue negative pressure wound therapy with some Prisma silver collagen tucked into the tunneled part of the wound. We will put a piece of silver alginate over the blister on the back part of her leg to absorb any drainage if/when it ruptures. Continue Kerlix and Coban compression. Nurse visit on Thursday. Follow-up in 1 week. Electronic Signature(s) Signed: 11/07/2022 9:41:06 AM By: Duanne Guess MD FACS Entered By: Duanne Guess on 11/07/2022 06:41:05 [place], [name] ([id number]) [id number] Page 7 of 8 -------------------------------------------------------------------------------- HxROS Details Patient Name: Date of Service: [name] Long, [name] Long 11/07/2022 9:00 A M Medical Record Number: [id number] Patient Account Number: [id number] Date of Birth/Sex: Treating RN: January 15, 1941 (82 y.o. [name] Provider: PA Zenovia Jordan, [place] Other Clinician: Referring Provider: Treating Provider/Extender: Sherryl Manges in Treatment: 8 Information Obtained From Patient Cardiovascular Medical History: Positive for: Arrhythmia - A fib; Congestive Heart Failure; Hypertension Past Medical History Notes: High Cholesterol Endocrine Medical History: Positive for: Type II Diabetes - since 2019 Genitourinary Medical History: Past Medical History Notes: Kidney Disease Immunizations Pneumococcal Vaccine: Received Pneumococcal Vaccination: No Implantable Devices No  devices added Family and Social History Hereditary Spherocytosis: No; Hypertension: No; Kidney Disease: No; Lung Disease: No; Seizures: No; Stroke: No; Thyroid Problems: No; Tuberculosis: No; Never smoker; Marital Status - Widowed; Alcohol Use: Never; Drug Use: No History; Caffeine Use: Never; Financial Concerns: No; Food, Clothing or Shelter Needs: No; Support System Lacking: No; Transportation Concerns: No Electronic Signature(s) Signed: 11/07/2022 9:48:01 AM By: Duanne Guess MD FACS Signed: 11/07/2022 5:02:17 PM By: [name] RN, BSN Entered By: Duanne Guess on 11/07/2022 06:37:55 -------------------------------------------------------------------------------- SuperBill Details Patient Name: Date of Service: [name] Long, [name] Long 11/07/2022 Medical Record Number: [id number] Patient Account Number: [id number] Date of Birth/Sex: Treating RN: Feb 23, 1941 (82 y.o. 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  Chronic kidney disease, stage 3 unspecified Facility Procedures : CPT4 Code: [id number] Description: 84696 - WOUND VAC-50 SQ CM OR LESS Modifier: Quantity: 1 Physician Procedures : CPT4 Code Description Modifier [id number] - WC PHYS LEVEL 4 - EST PT ICD-10 Diagnosis Description L97.822 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

## 2022-11-09 ENCOUNTER — Encounter (HOSPITAL_BASED_OUTPATIENT_CLINIC_OR_DEPARTMENT_OTHER): Payer: Medicare Other | Admitting: General Surgery

## 2022-11-09 DIAGNOSIS — E11622 Type 2 diabetes mellitus with other skin ulcer: Secondary | ICD-10-CM | POA: Diagnosis not present

## 2022-11-09 NOTE — Progress Notes (Signed)
[place], [place] ([id number]) [id number] Page 1 of 5 Visit Report for 11/09/2022 Arrival Information Details Patient Name: Date of Service: [name], Long 11/09/2022 7:30 A M Medical Record Number: [id number] Patient Account Number: [id number] Date of Birth/Sex: Treating RN: [birth year] (82 y.o. [name] Long, [name] Primary Care Maynard David: PA Zenovia Jordan, NO Other Clinician: Referring Kadelyn Dimascio: Treating Joliyah Lippens/Extender: Sherryl Manges in Treatment: 9 Visit Information History Since Last Visit Added or deleted any medications: No Patient Arrived: Walker Any new allergies or adverse reactions: No Arrival Time: 07:58 Had a fall or experienced change in No Accompanied By: daughter activities of daily living that may affect Transfer Assistance: None risk of falls: Patient Identification Verified: Yes Signs or symptoms of abuse/neglect since last visito No Secondary Verification Process Completed: Yes Hospitalized since last visit: No Patient Requires Transmission-Based Precautions: No Implantable device outside of the clinic excluding No Patient Has Alerts: No cellular tissue based products placed in the center since last visit: Has Dressing in Place as Prescribed: Yes Has Compression in Place as Prescribed: Yes Pain Present Now: No Electronic Signature(s) Signed: 11/09/2022 4:53:04 PM By: [name] RN, BSN Entered By: [name] on 11/09/2022 04:58:26 -------------------------------------------------------------------------------- Encounter Discharge Information Details Patient Name: Date of Service: [name] Long, [name] 11/09/2022 7:30 A M Medical Record Number: [id number] Patient Account Number: [id number] Date of Birth/Sex: Treating RN: 03/20/1940 (82 y.o. [name] Long Primary Care Dexter Signor: PA Zenovia Jordan, [place] Other Clinician: Referring Kazden Largo: Treating Shayann Garbutt/Extender: Sherryl Manges in Treatment: 9 Encounter Discharge Information  Items Discharge Condition: Stable Ambulatory Status: Walker Discharge Destination: Home Transportation: Private Auto Accompanied By: daughter Schedule Follow-up Appointment: Yes Clinical Summary of Care: Patient Declined Electronic Signature(s) Signed: 11/09/2022 4:53:04 PM By: [name] RN, BSN Entered By: [name] on 11/09/2022 05:00:27 [place], [name] Long ([id number]) [id number] Page 2 of 5 -------------------------------------------------------------------------------- Negative Pressure Wound Therapy Maintenance (NPWT) Details Patient Name: Date of Service: [name], Long 11/09/2022 7:30 A M Medical Record Number: [id number] Patient Account Number: [id number] Date of Birth/Sex: Treating RN: 05/16/1940 (82 y.o. [name] Long Primary Care Rafaella Kole: PA Zenovia Jordan, [place] Other Clinician: Referring Dwanda Tufano: Treating Carmin Alvidrez/Extender: Sherryl Manges in Treatment: 9 NPWT Maintenance Performed for: Wound #1 Left, Lateral Lower Leg Additional Injuries Covered: No Performed By: [name], RN Type: Medela [employer] Coverage Size (sq cm): 1.44 Pressure Type: Constant Pressure Setting: 125 mmHG Drain Type: None Primary Contact: Silver Sponge/Dressing Type: Foam- Black Date Initiated: 09/20/2022 Dressing Removed: Yes Quantity of Sponges/Gauze Removed: 2 Canister Changed: No Canister Exudate Volume: 20 Dressing Reapplied: Yes Quantity of Sponges/Gauze Inserted: 2 Respones T Treatment: o good Days On NPWT : 51 Electronic Signature(s) Signed: 11/09/2022 4:53:04 PM By: [name] RN, BSN Entered By: [name] on 11/09/2022 04:59:38 -------------------------------------------------------------------------------- Patient/Caregiver Education Details Patient Name: Date of Service: [name] Long, [name] 9/5/2024andnbsp7:30 A M Medical Record Number: [id number] Patient Account Number: [id number] Date of Birth/Gender: Treating RN: 1940-07-13 (82 y.o.  [name] Long Primary Care Physician: PA Zenovia Jordan, [place] Other Clinician: Referring Physician: Treating Physician/Extender: Sherryl Manges in Treatment: 9 Education Assessment Education Provided To: Patient Education Topics Provided Venous: Methods: Explain/Verbal Responses: Reinforcements needed, State content correctly Wound/Skin Impairment: Methods: Explain/Verbal Responses: Reinforcements needed, State content correctly Electronic Signature(s) Signed: 11/09/2022 4:53:04 PM By: [name] RN, BSN [place], [name] Long ([id number]) [id number] Page 3 of 5 Entered By: [name] on 11/09/2022 05:00:11 -------------------------------------------------------------------------------- Wound Assessment Details Patient Name: Date of Service: [name], Long 11/09/2022 7:30 A M Medical Record Number: [id number] Patient Account Number: [id number] Date of Birth/Sex: Treating  RN: [birth year] (82 y.o. [name] Long Primary Care Cedricka Sackrider: PA Zenovia Jordan, [place] Other Clinician: Referring Braedon Sjogren: Treating Ziquan Fidel/Extender: Sherryl Manges in Treatment: 9 Wound Status Wound Number: 1 Primary Etiology: Diabetic Wound/Ulcer of the Lower Extremity Wound Location: Left, Lateral Lower Leg Wound Status: Open Wounding Event: Hematoma Date Acquired: 08/05/2022 Weeks Of Treatment: 9 Clustered Wound: Yes Wound Measurements Length: (cm) 1.8 Width: (cm) 0.8 Depth: (cm) 0.1 Area: (cm) 1.131 Volume: (cm) 0.113 % Reduction in Area: 96.8% % Reduction in Volume: 99.6% Wound Description Classification: Grade 2 Exudate Amount: Medium Exudate Type: Serosanguineous Exudate Color: red, brown Periwound Skin Texture Texture Color No Abnormalities Noted: No No Abnormalities Noted: No Moisture No Abnormalities Noted: No Treatment Notes Wound #1 (Lower Leg) Wound Laterality: Left, Lateral Cleanser Soap and Water Discharge Instruction: May shower and wash wound with  dial antibacterial soap and water prior to dressing change. Vashe 5.8 (oz) Discharge Instruction: Cleanse the wound with Vashe prior to applying a clean dressing using gauze sponges, not tissue or cotton balls. Peri-Wound Care Topical Primary Dressing Promogran Prisma Matrix, 4.34 (sq in) (silver collagen) Discharge Instruction: Moisten collagen with saline and place at base of tunnel NPWT Secondary Dressing Secured With Compression Wrap Kerlix Roll 4.5x3.1 (in/yd) Discharge Instruction: Apply Kerlix and Coban compression as directed. Coban Self-Adherent Wrap 4x5 (in/yd) Discharge Instruction: Apply over Kerlix as directed. [place], [place] ([id number]) [id number] Page 4 of 5 Compression Stockings Add-Ons Electronic Signature(s) Signed: 11/09/2022 4:53:04 PM By: [name] RN, BSN Entered By: [name] on 11/09/2022 04:58:46 -------------------------------------------------------------------------------- Wound Assessment Details Patient Name: Date of Service: [name], Long 11/09/2022 7:30 A M Medical Record Number: [id number] Patient Account Number: [id number] Date of Birth/Sex: Treating RN: 1941/01/11 (82 y.o. [name] Long Primary Care Anndrea Mihelich: PA Zenovia Jordan, [place] Other Clinician: Referring Gilford Lardizabal: Treating Kazi Reppond/Extender: Sherryl Manges in Treatment: 9 Wound Status Wound Number: 3 Primary Etiology: Infection - not elsewhere classified Wound Location: Left, Posterior Lower Leg Wound Status: Open Wounding Event: Trauma Date Acquired: 09/20/2022 Weeks Of Treatment: 7 Clustered Wound: No Wound Measurements Length: (cm) 0.1 Width: (cm) 0.1 Depth: (cm) 0.1 Area: (cm) 0.008 Volume: (cm) 0.001 % Reduction in Area: 99.8% % Reduction in Volume: 99.8% Wound Description Classification: Full Thickness Without Exposed Support Exudate Amount: Small Exudate Type: Serous Exudate Color: amber Structures Periwound Skin Texture Texture  Color No Abnormalities Noted: No No Abnormalities Noted: No Moisture No Abnormalities Noted: No Treatment Notes Wound #3 (Lower Leg) Wound Laterality: Left, Posterior Cleanser Soap and Water Discharge Instruction: May shower and wash wound with dial antibacterial soap and water prior to dressing change. Vashe 5.8 (oz) Discharge Instruction: Cleanse the wound with Vashe prior to applying a clean dressing using gauze sponges, not tissue or cotton balls. Peri-Wound Care Topical Primary Dressing Maxorb Extra Ag+ Alginate Dressing, 2x2 (in/in) Discharge Instruction: Apply to wound bed as instructed Secondary Dressing Woven Gauze Sponges 2x2 in Discharge Instruction: Apply over primary dressing as directed. [place], [place] ([id number]) [id number] Page 5 of 5 Secured With Compression Wrap Kerlix Roll 4.5x3.1 (in/yd) Discharge Instruction: Apply Kerlix and Coban compression as directed. Coban Self-Adherent Wrap 4x5 (in/yd) Discharge Instruction: Apply over Kerlix as directed. Compression Stockings Add-Ons Electronic Signature(s) Signed: 11/09/2022 4:53:04 PM By: [name] RN, BSN Entered By: [name] on 11/09/2022 04:58:46

## 2022-11-09 NOTE — Progress Notes (Signed)
[place], [place] ([id number]) [id number] Page 1 of 1 Visit Report for 11/09/2022 SuperBill Details Patient Name: Date of Service: [name] Long, [name] Long 11/09/2022 Medical Record Number: [id number] Patient Account Number: [id number] Date of Birth/Sex: Treating RN: 04-15-1940 (82 y.o. 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

## 2022-11-13 ENCOUNTER — Encounter (HOSPITAL_BASED_OUTPATIENT_CLINIC_OR_DEPARTMENT_OTHER): Payer: Medicare Other | Admitting: General Surgery

## 2022-11-13 DIAGNOSIS — E11622 Type 2 diabetes mellitus with other skin ulcer: Secondary | ICD-10-CM | POA: Diagnosis not present

## 2022-11-13 NOTE — Progress Notes (Signed)
[place], [place] ([id number]) [id number] Page 1 of 7 Visit Report for 11/13/2022 Chief Complaint Document Details Patient Name: Date of Service: [name] Long, [name] Long 11/13/2022 7:30 A M Medical Record Number: [id number] Patient Account Number: [id number] Date of Birth/Sex: Treating RN: 1940-12-16 (82 y.o. F) Primary Care Provider: PA Zenovia Jordan, NO Other Clinician: Referring Provider: Treating Provider/Extender: Sherryl Manges in Treatment: 9 Information Obtained from: Patient Chief Complaint Patient seen for complaints of Non-Healing Wound. Electronic Signature(s) Signed: 11/13/2022 8:10:27 AM By: Duanne Guess MD FACS Entered By: Duanne Guess on 11/13/2022 05:10:27 -------------------------------------------------------------------------------- HPI Details Patient Name: Date of Service: [name] Long, [name] Long 11/13/2022 7:30 A M Medical Record Number: [id number] Patient Account Number: [id number] Date of Birth/Sex: Treating RN: [birth year] (82 y.o. F) Primary Care Provider: PA TIENT, NO Other Clinician: Referring Provider: Treating Provider/Extender: Sherryl Manges in Treatment: 9 History of Present Illness HPI Description: ADMISSION 09/06/2022 This is an 82 year old type II diabetic (no A1c to review and patient is unaware of her last value) with a history of atrial fibrillation on chronic anticoagulant therapy, lower extremity edema, congestive heart failure, and stage III kidney disease. She divides her time between her home in [place]. [place] and here in [place] where her daughter lives. While in [place], she struck her leg on her bed frame and developed a large hematoma. She was evaluated in the emergency department there and imaging was negative for any deeper injury, such as a fracture. She returned to [facility] and has had an urgent care and emergency department visit to evaluate the site. As there had been no significant improvement in her wound, she was  referred to the wound care center for further evaluation and management. I am unable to see any evidence of an attempt to evacuate the hematoma in the electronic medical record.. There are 2 openings in her skin, but they connect to each other with significant tunneling and undermining. I am unsure if these opened spontaneously or there was a non- documented attempt to drain the hematoma site. The majority of her left lower leg from below the tibial tuberosity to just above the ankle and from the anterior tibia wrapping laterally and posteriorly to nearly the medial aspect of her leg is involved. There is a foul odor coming from her wound. There is no frank pus but there is significant retained clot that has likely served as culture media. ABI in clinic today was 0.84. 09/08/2022: The patient came in today as an add-on due to bloody drainage that was saturating her dressing. On inspection, it appeared that there was no frank fresh blood and no active site of bleeding identified; it appears that more of the hematoma was liquefied and draining from her wound. 09/13/2022: The wound has cleaned up considerably. There is no longer any foul odor and the drainage has decreased significantly. The patient is also complaining of less pain. There is still some nonviable subcutaneous tissue and fat present but overall everything is improved from the last visit. Her culture grew out a polymicrobial population of multiple drug-resistant organisms. She is currently taking Augmentin and levofloxacin as suggested by the culture data. 09/20/2022: The cavity is contracting nicely. There is no longer a connection between the posterior wound and the lateral leg wound. The posterior wound has a little bit of slough on the surface but the larger main wound is very clean. She has been approved for a wound VAC and we will apply that today. 09/25/2022: Post Wound sloughly- alginate Ag. Vac on lateral wound;  decreased  undermining. 10/02/2022: There is a little bit of slough on the posterior wound. The lateral leg wound continues to contract at a remarkable rate. It is clean without any slough or other debris accumulation. 10/09/2022: The posterior wound is about half the size as it was last week. The lateral leg wound has contracted remarkably. There is still some undermining from 9-12 o'clock. Everything is very clean. [place], [place] ([id number]) [id number] Page 2 of 7 10/19/2022: Both wounds have contracted considerably. Once again, the posterior wound is about half the size as it was last week. The tunnel is down to about a centimeter. Everything is extremely clean. Edema control is good. 10/23/2022: The posterior wound continues to contract at a remarkable rate. She has had some bleeding from the anterior wound over the last 24 hours to the extent that it clogged the tubing for her wound VAC.. It appears to be coming from the small residual tunneled portion of the wound. No concern for infection. 10/30/2022: The posterior wound is nearly closed. The anterior wound had a little bit of oozing but not to the degree as last week. The tunnel is about the same depth while the orifice has contracted a little bit. No concern for infection. 11/07/2022: The posterior wound is healed. There is a tiny blister overlying the epithelialized surface. The anterior wound tunnel has contracted substantially. The wound surface is clean and healthy-looking. 11/13/2022: The blister over the posterior wound has resolved. The anterior wound just has about 0.4 cm of undermining. Everything is clean and healthy-looking. Electronic Signature(s) Signed: 11/13/2022 8:11:11 AM By: Duanne Guess MD FACS Entered By: Duanne Guess on 11/13/2022 05:11:11 -------------------------------------------------------------------------------- Physical Exam Details Patient Name: Date of Service: [name] Long, [name] Long 11/13/2022 7:30 A  M Medical Record Number: [id number] Patient Account Number: [id number] Date of Birth/Sex: Treating RN: 1941/01/25 (82 y.o. F) Primary Care Provider: PA Zenovia Jordan, NO Other Clinician: Referring Provider: Treating Provider/Extender: Sherryl Manges in Treatment: 9 Constitutional Hypertensive, asymptomatic. Slightly bradycardic. . . no acute distress. Respiratory Normal work of breathing on room air. Notes 11/13/2022: The blister over the posterior wound has resolved. The anterior wound just has about 0.4 cm of undermining. Everything is clean and healthy-looking. Electronic Signature(s) Signed: 11/13/2022 8:11:47 AM By: Duanne Guess MD FACS Entered By: Duanne Guess on 11/13/2022 05:11:47 -------------------------------------------------------------------------------- Physician Orders Details Patient Name: Date of Service: [name] Long, [name] Long 11/13/2022 7:30 A M Medical Record Number: [id number] Patient Account Number: [id number] Date of Birth/Sex: Treating RN: November 15, 1940 (82 y.o. [name] Provider: PA Zenovia Jordan, [place] Other Clinician: Referring Provider: Treating Provider/Extender: Sherryl Manges in Treatment: 9 Verbal / Phone Orders: No Diagnosis Coding ICD-10 Coding Code Description [phone] 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 Z79.01 Long term (current) use of anticoagulants I50.42 Chronic combined systolic (congestive) and diastolic (congestive) heart failure [place], [place] ([id number]) [id number] Page 3 of 7 N18.30 Chronic kidney disease, stage 3 unspecified Follow-up Appointments ppointment in 1 week. - Dr. Lady Gary - RM 1 Return A Tues 9/16 at 07:30AM Anesthetic (In clinic) Topical Lidocaine 4% applied to wound bed Bathing/ Shower/ Hygiene May shower with protection but do not get wound dressing(s) wet. Protect dressing(s) with  water repellant cover (for example, large plastic bag) or a cast cover and may then take shower. Negative Presssure Wound Therapy Discontinue wound vac. Call the number on the machine for the company to pick up. Edema Control -  Lymphedema / SCD / Other Elevate legs to the level of the heart or above for 30 minutes daily and/or when sitting for 3-4 times a day throughout the day. Avoid standing for long periods of time. Exercise regularly Wound Treatment Wound #1 - Lower Leg Wound Laterality: Left, Lateral Cleanser: Soap and Water 1 x Per Day/30 Days Discharge Instructions: May shower and wash wound with dial antibacterial soap and water prior to dressing change. Cleanser: Vashe 5.8 (oz) 1 x Per Day/30 Days Discharge Instructions: Cleanse the wound with Vashe prior to applying a clean dressing using gauze sponges, not tissue or cotton balls. Prim Dressing: Promogran Prisma Matrix, 4.34 (sq in) (silver collagen) 1 x Per Day/30 Days ary Discharge Instructions: Moisten collagen with saline and place at base of tunnel Secondary Dressing: Woven Gauze Sponge, Non-Sterile 4x4 in 1 x Per Day/30 Days Discharge Instructions: Apply over primary dressing as directed. Compression Wrap: Kerlix Roll 4.5x3.1 (in/yd) 1 x Per Day/30 Days Discharge Instructions: Apply Kerlix and Coban compression as directed. Compression Wrap: Coban Self-Adherent Wrap 4x5 (in/yd) 1 x Per Day/30 Days Discharge Instructions: Apply over Kerlix as directed. Electronic Signature(s) Signed: 11/13/2022 9:11:23 AM By: Duanne Guess MD FACS Entered By: Duanne Guess on 11/13/2022 05:12:13 -------------------------------------------------------------------------------- Problem List Details Patient Name: Date of Service: [name] Long, [name] Long 11/13/2022 7:30 A M Medical Record Number: [id number] Patient Account Number: [id number] Date of Birth/Sex: Treating RN: 05/27/1940 (82 y.o. F) Primary Care Provider: PA TIENT, NO Other  Clinician: Referring Provider: Treating Provider/Extender: Sherryl Manges in Treatment: 9 Active Problems ICD-10 Encounter Code Description Active Date MDM Diagnosis L97.822 Non-pressure chronic ulcer of other part of left lower leg with fat layer exposed7/05/2022 No Yes E11.622 Type 2 diabetes mellitus with other skin ulcer 09/06/2022 No Yes [place], [place] ([id number]) [id number] Page 4 of 7 Z79.01 Long term (current) use of anticoagulants 09/06/2022 No Yes I50.42 Chronic combined systolic (congestive) and diastolic (congestive) heart failure 09/06/2022 No Yes N18.30 Chronic kidney disease, stage 3 unspecified 09/06/2022 No Yes Inactive Problems Resolved Problems Electronic Signature(s) Signed: 11/13/2022 8:10:02 AM By: Duanne Guess MD FACS Entered By: Duanne Guess on 11/13/2022 05:10:02 -------------------------------------------------------------------------------- Progress Note Details Patient Name: Date of Service: [name] Long, [name] Long 11/13/2022 7:30 A M Medical Record Number: [id number] Patient Account Number: [id number] Date of Birth/Sex: Treating RN: 01-12-1941 (82 y.o. F) Primary Care Provider: PA Zenovia Jordan, NO Other Clinician: Referring Provider: Treating Provider/Extender: Sherryl Manges in Treatment: 9 Subjective Chief Complaint Information obtained from Patient Patient seen for complaints of Non-Healing Wound. History of Present Illness (HPI) ADMISSION 09/06/2022 This is an 82 year old type II diabetic (no A1c to review and patient is unaware of her last value) with a history of atrial fibrillation on chronic anticoagulant therapy, lower extremity edema, congestive heart failure, and stage III kidney disease. She divides her time between her home in [place] and here in [place] where her daughter lives. While in [place], she struck her leg on her bed frame and developed a large hematoma. She was evaluated in the emergency department  there and imaging was negative for any deeper injury, such as a fracture. She returned to [facility] and has had an urgent care and emergency department visit to evaluate the site. As there had been no significant improvement in her wound, she was referred to the wound care center for further evaluation and management. I am unable to see any evidence of an attempt to evacuate the hematoma in the electronic medical record.. There are 2 openings in her  skin, but they connect to each other with significant tunneling and undermining. I am unsure if these opened spontaneously or there was a non- documented attempt to drain the hematoma site. The majority of her left lower leg from below the tibial tuberosity to just above the ankle and from the anterior tibia wrapping laterally and posteriorly to nearly the medial aspect of her leg is involved. There is a foul odor coming from her wound. There is no frank pus but there is significant retained clot that has likely served as culture media. ABI in clinic today was 0.84. 09/08/2022: The patient came in today as an add-on due to bloody drainage that was saturating her dressing. On inspection, it appeared that there was no frank fresh blood and no active site of bleeding identified; it appears that more of the hematoma was liquefied and draining from her wound. 09/13/2022: The wound has cleaned up considerably. There is no longer any foul odor and the drainage has decreased significantly. The patient is also complaining of less pain. There is still some nonviable subcutaneous tissue and fat present but overall everything is improved from the last visit. Her culture grew out a polymicrobial population of multiple drug-resistant organisms. She is currently taking Augmentin and levofloxacin as suggested by the culture data. 09/20/2022: The cavity is contracting nicely. There is no longer a connection between the posterior wound and the lateral leg wound. The posterior  wound has a little bit of slough on the surface but the larger main wound is very clean. She has been approved for a wound VAC and we will apply that today. 09/25/2022: Post Wound sloughly- alginate Ag. Vac on lateral wound; decreased undermining. 10/02/2022: There is a little bit of slough on the posterior wound. The lateral leg wound continues to contract at a remarkable rate. It is clean without any slough or other debris accumulation. 10/09/2022: The posterior wound is about half the size as it was last week. The lateral leg wound has contracted remarkably. There is still some undermining from 9-12 o'clock. Everything is very clean. 10/19/2022: Both wounds have contracted considerably. Once again, the posterior wound is about half the size as it was last week. The tunnel is down to about a centimeter. Everything is extremely clean. Edema control is good. [place], [place] ([id number]) [id number] Page 5 of 7 10/23/2022: The posterior wound continues to contract at a remarkable rate. She has had some bleeding from the anterior wound over the last 24 hours to the extent that it clogged the tubing for her wound VAC.. It appears to be coming from the small residual tunneled portion of the wound. No concern for infection. 10/30/2022: The posterior wound is nearly closed. The anterior wound had a little bit of oozing but not to the degree as last week. The tunnel is about the same depth while the orifice has contracted a little bit. No concern for infection. 11/07/2022: The posterior wound is healed. There is a tiny blister overlying the epithelialized surface. The anterior wound tunnel has contracted substantially. The wound surface is clean and healthy-looking. 11/13/2022: The blister over the posterior wound has resolved. The anterior wound just has about 0.4 cm of undermining. Everything is clean and healthy-looking. Patient History Information obtained from Patient. Family  History No family history of Hereditary Spherocytosis, Hypertension, Kidney Disease, Lung Disease, Seizures, Stroke, Thyroid Problems, Tuberculosis. Social History Never smoker, Marital Status - Widowed, Alcohol Use - Never, Drug Use - No History, Caffeine Use - Never. Medical History Cardiovascular  Patient has history of Arrhythmia - A fib, Congestive Heart Failure, Hypertension Endocrine Patient has history of Type II Diabetes - since 2019 Medical A Surgical History Notes nd Cardiovascular High Cholesterol Genitourinary Kidney Disease Objective Constitutional Hypertensive, asymptomatic. Slightly bradycardic. no acute distress. Vitals Time Taken: 7:39 AM, Height: 64 in, Weight: 162 lbs, BMI: 27.8, Temperature: 98.5 F, Pulse: 56 bpm, Respiratory Rate: 18 breaths/min, Blood Pressure: 178/72 mmHg. Respiratory Normal work of breathing on room air. General Notes: 11/13/2022: The blister over the posterior wound has resolved. The anterior wound just has about 0.4 cm of undermining. Everything is clean and healthy-looking. Integumentary (Hair, Skin) Wound #1 status is Open. Original cause of wound was Hematoma. The date acquired was: 08/05/2022. The wound has been in treatment 9 weeks. The wound is located on the Left,Lateral Lower Leg. The wound measures 1.7cm length x 0.8cm width x 0.1cm depth; 1.068cm^2 area and 0.107cm^3 volume. There is Fat Layer (Subcutaneous Tissue) exposed. There is no tunneling noted, however, there is undermining starting at 9:00 and ending at 12:00 with a maximum distance of 0.4cm. There is a medium amount of serosanguineous drainage noted. The wound margin is well defined and not attached to the wound base. There is large (67-100%) red granulation within the wound bed. There is no necrotic tissue within the wound bed. The periwound skin appearance had no abnormalities noted for texture. The periwound skin appearance had no abnormalities noted for moisture. The  periwound skin appearance exhibited: Hemosiderin Staining. Periwound temperature was noted as No Abnormality. Wound #3 status is Healed - Epithelialized. Original cause of wound was Trauma. The date acquired was: 09/20/2022. The wound has been in treatment 7 weeks. The wound is located on the Left,Posterior Lower Leg. The wound measures 0cm length x 0cm width x 0cm depth; 0cm^2 area and 0cm^3 volume. There is no tunneling or undermining noted. There is a small amount of serosanguineous drainage noted. There is no granulation within the wound bed. There is no necrotic tissue within the wound bed. The periwound skin appearance had no abnormalities noted for texture. The periwound skin appearance had no abnormalities noted for moisture. The periwound skin appearance exhibited: Hemosiderin Staining. Periwound temperature was noted as No Abnormality. Assessment Active Problems ICD-10 Non-pressure chronic ulcer of other part of left lower leg with fat layer exposed Type 2 diabetes mellitus with other skin ulcer Long term (current) use of anticoagulants Chronic combined systolic (congestive) and diastolic (congestive) heart failure Chronic kidney disease, stage 3 unspecified [name] Long, [name] Long ([id number]) [id number] Page 6 of 7 Plan Follow-up Appointments: Return Appointment in 1 week. - Dr. Lady Gary - RM 1 Tues 9/16 at 07:30AM Anesthetic: (In clinic) Topical Lidocaine 4% applied to wound bed Bathing/ Shower/ Hygiene: May shower with protection but do not get wound dressing(s) wet. Protect dressing(s) with water repellant cover (for example, large plastic bag) or a cast cover and may then take shower. Negative Presssure Wound Therapy: Discontinue wound vac. Call the number on the machine for the company to pick up. Edema Control - Lymphedema / SCD / Other: Elevate legs to the level of the heart or above for 30 minutes daily and/or when sitting for 3-4 times a day throughout  the day. Avoid standing for long periods of time. Exercise regularly WOUND #1: - Lower Leg Wound Laterality: Left, Lateral Cleanser: Soap and Water 1 x Per Day/30 Days Discharge Instructions: May shower and wash wound with dial antibacterial soap and water prior to dressing change. Cleanser: Vashe 5.8 (oz) 1 x  Per Day/30 Days Discharge Instructions: Cleanse the wound with Vashe prior to applying a clean dressing using gauze sponges, not tissue or cotton balls. Prim Dressing: Promogran Prisma Matrix, 4.34 (sq in) (silver collagen) 1 x Per Day/30 Days ary Discharge Instructions: Moisten collagen with saline and place at base of tunnel Secondary Dressing: Woven Gauze Sponge, Non-Sterile 4x4 in 1 x Per Day/30 Days Discharge Instructions: Apply over primary dressing as directed. Com pression Wrap: Kerlix Roll 4.5x3.1 (in/yd) 1 x Per Day/30 Days Discharge Instructions: Apply Kerlix and Coban compression as directed. Com pression Wrap: Coban Self-Adherent Wrap 4x5 (in/yd) 1 x Per Day/30 Days Discharge Instructions: Apply over Kerlix as directed. 11/13/2022: The blister over the posterior wound has resolved. The anterior wound just has about 0.4 cm of undermining. Everything is clean and healthy- looking. No debridement was necessary today. I think we can discontinue negative pressure wound therapy and just pack the small remaining cavity with Prisma silver collagen and apply 3 layer compression/equivalent. Follow-up in 1 week. Electronic Signature(s) Signed: 11/13/2022 8:12:50 AM By: Duanne Guess MD FACS Entered By: Duanne Guess on 11/13/2022 05:12:49 -------------------------------------------------------------------------------- HxROS Details Patient Name: Date of Service: [name] Long, [name] Long 11/13/2022 7:30 A M Medical Record Number: [id number] Patient Account Number: [id number] Date of Birth/Sex: Treating RN: [birth year] (82 y.o. F) Primary Care Provider: PA Zenovia Jordan, NO Other Clinician: Referring  Provider: Treating Provider/Extender: Sherryl Manges in Treatment: 9 Information Obtained From Patient Cardiovascular Medical History: Positive for: Arrhythmia - A fib; Congestive Heart Failure; Hypertension Past Medical History Notes: High Cholesterol Endocrine Medical History: Positive for: Type II Diabetes - since [address], [place] ([id number]) [id number] Page 7 of 7 Medical History: Past Medical History Notes: Kidney Disease Immunizations Pneumococcal Vaccine: Received Pneumococcal Vaccination: No Implantable Devices No devices added Family and Social History Hereditary Spherocytosis: No; Hypertension: No; Kidney Disease: No; Lung Disease: No; Seizures: No; Stroke: No; Thyroid Problems: No; Tuberculosis: No; Never smoker; Marital Status - Widowed; Alcohol Use: Never; Drug Use: No History; Caffeine Use: Never; Financial Concerns: No; Food, Clothing or Shelter Needs: No; Support System Lacking: No; Transportation Concerns: No Electronic Signature(s) Signed: 11/13/2022 9:11:23 AM By: Duanne Guess MD FACS Entered By: Duanne Guess on 11/13/2022 05:11:19 -------------------------------------------------------------------------------- SuperBill Details Patient Name: Date of Service: [name] Long, [name] Long 11/13/2022 Medical Record Number: [id number] Patient Account Number: [id number] Date of Birth/Sex: Treating RN: 24-Feb-1941 (82 y.o. [name] Provider: PA Zenovia Jordan, [place] Other Clinician: Referring Provider: Treating Provider/Extender: Sherryl Manges in Treatment: 9 Diagnosis Coding ICD-10 Codes Code Description [phone] 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 Z79.01 Long term (current) use of anticoagulants I50.42 Chronic combined systolic (congestive) and diastolic (congestive) heart failure N18.30 Chronic  kidney disease, stage 3 unspecified Facility Procedures : CPT4 Code: [id number] Description: 99213 - WOUND CARE VISIT-LEV 3 EST PT Modifier: Quantity: 1 Physician Procedures : CPT4 Code Description Modifier [id number] - WC PHYS LEVEL 4 - EST PT ICD-10 Diagnosis Description L97.822 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 Z79.01  Long term (current) use of anticoagulants I50.42 Chronic combined systolic (congestive) and diastolic (congestive) heart failure Quantity: 1 Electronic Signature(s) Signed: 11/13/2022 8:13:07 AM By: Duanne Guess MD FACS Entered By: Duanne Guess on 11/13/2022 05:13:07

## 2022-11-13 NOTE — Progress Notes (Signed)
[place], [place] ([id number]) [id number] Page 1 of 10 Visit Report for 11/13/2022 Arrival Information Details Patient Name: Date of Service: [name] Long, [name] Long 11/13/2022 7:30 A M Medical Record Number: [id number] Patient Account Number: [id number] Date of Birth/Sex: Treating RN: [birth year] (82 y.o. [name] Long, [name] Long Primary Care Yonathan Perrow: PA Zenovia Jordan, NO Other Clinician: Referring Jackye Dever: Treating Kalmen Lollar/Extender: Sherryl Manges in Treatment: 9 Visit Information History Since Last Visit Added or deleted any medications: No Patient Arrived: Walker Any new allergies or adverse reactions: No Arrival Time: 07:38 Had a fall or experienced change in No Accompanied By: daughter activities of daily living that may affect Transfer Assistance: None risk of falls: Patient Identification Verified: Yes Signs or symptoms of abuse/neglect since last visito No Secondary Verification Process Completed: Yes Hospitalized since last visit: No Patient Requires Transmission-Based Precautions: No Implantable device outside of the clinic excluding No Patient Has Alerts: No cellular tissue based products placed in the center since last visit: Has Dressing in Place as Prescribed: Yes Has Compression in Place as Prescribed: Yes Pain Present Now: No Electronic Signature(s) Signed: 11/13/2022 4:55:31 PM By: [name] RN, BSN Entered By: [name] on 11/13/2022 04:39:13 -------------------------------------------------------------------------------- Clinic Level of Care Assessment Details Patient Name: Date of Service: [name] Long, [name] Long 11/13/2022 7:30 A M Medical Record Number: [id number] Patient Account Number: [id number] Date of Birth/Sex: Treating RN: Mar 11, 1940 (82 y.o. [name] Long Primary Care [name] Long: PA Zenovia Jordan, [place] Other Clinician: Referring [name] Long: Treating [name] Long/Extender: Sherryl Manges in Treatment: 9 Clinic Level of Care Assessment Items TOOL 4  Quantity Score []  - 0 Use when only an EandM is performed on FOLLOW-UP visit ASSESSMENTS - Nursing Assessment / Reassessment X- 1 10 Reassessment of Co-morbidities (includes updates in patient status) X- 1 5 Reassessment of Adherence to Treatment Plan ASSESSMENTS - Wound and Skin A ssessment / Reassessment []  - 0 Simple Wound Assessment / Reassessment - one wound X- 2 5 Complex Wound Assessment / Reassessment - multiple wounds []  - 0 Dermatologic / Skin Assessment (not related to wound area) ASSESSMENTS - Focused Assessment X- 1 5 Circumferential Edema Measurements - multi extremities []  - 0 Nutritional Assessment / Counseling / Intervention [place], [name] Long ([id number]) [id number] Page 2 of 10 X- 1 5 Lower Extremity Assessment (monofilament, tuning fork, pulses) []  - 0 Peripheral Arterial Disease Assessment (using hand held doppler) ASSESSMENTS - Ostomy and/or Continence Assessment and Care []  - 0 Incontinence Assessment and Management []  - 0 Ostomy Care Assessment and Management (repouching, etc.) PROCESS - Coordination of Care X - Simple Patient / Family Education for ongoing care 1 15 []  - 0 Complex (extensive) Patient / Family Education for ongoing care X- 1 10 Staff obtains Chiropractor, Records, T Results / Process Orders est []  - 0 Staff telephones HHA, Nursing Homes / Clarify orders / etc []  - 0 Routine Transfer to another Facility (non-emergent condition) []  - 0 Routine Hospital Admission (non-emergent condition) []  - 0 New Admissions / Manufacturing engineer / Ordering NPWT Apligraf, etc. , []  - 0 Emergency Hospital Admission (emergent condition) X- 1 10 Simple Discharge Coordination []  - 0 Complex (extensive) Discharge Coordination PROCESS - Special Needs []  - 0 Pediatric / Minor Patient Management []  - 0 Isolation Patient Management []  - 0 Hearing / Language / Visual special needs []  - 0 Assessment of Community assistance  (transportation, D/C planning, etc.) []  - 0 Additional assistance / Altered mentation []  - 0 Support Surface(s) Assessment (bed, cushion, seat, etc.) INTERVENTIONS - Wound Cleansing / Measurement []  - 0  Simple Wound Cleansing - one wound X- 2 5 Complex Wound Cleansing - multiple wounds X- 1 5 Wound Imaging (photographs - any number of wounds) []  - 0 Wound Tracing (instead of photographs) []  - 0 Simple Wound Measurement - one wound X- 2 5 Complex Wound Measurement - multiple wounds INTERVENTIONS - Wound Dressings X - Small Wound Dressing one or multiple wounds 1 10 []  - 0 Medium Wound Dressing one or multiple wounds []  - 0 Large Wound Dressing one or multiple wounds []  - 0 Application of Medications - topical []  - 0 Application of Medications - injection INTERVENTIONS - Miscellaneous []  - 0 External ear exam []  - 0 Specimen Collection (cultures, biopsies, blood, body fluids, etc.) []  - 0 Specimen(s) / Culture(s) sent or taken to Lab for analysis []  - 0 Patient Transfer (multiple staff / Nurse, adult / Similar devices) []  - 0 Simple Staple / Suture removal (25 or less) []  - 0 Complex Staple / Suture removal (26 or more) []  - 0 Hypo / Hyperglycemic Management (close monitor of Blood Glucose) [place]el[place], Tiamarie ([id number]) [id number] Page 3 of 10 []  - 0 Ankle / Brachial Index (ABI) - do not check if billed separately X- 1 5 Vital Signs Has the patient been seen at the hospital within the last three years: Yes Total Score: 110 Level Of Care: New/Established - Level 3 Electronic Signature(s) Signed: 11/13/2022 4:55:31 PM By: [name] RN, BSN Entered By: [name] on 11/13/2022 05:10:52 -------------------------------------------------------------------------------- Encounter Discharge Information Details Patient Name: Date of Service: [name] Long, [name] Long 11/13/2022 7:30 A M Medical Record Number: [id number] Patient Account Number:  [id number] Date of Birth/Sex: Treating RN: October 26, 1940 (82 y.o. [name] Long Primary Care Suetta Hoffmeister: PA Zenovia Jordan, [place] Other Clinician: Referring Haileyann Staiger: Treating Ira Busbin/Extender: Sherryl Manges in Treatment: 9 Encounter Discharge Information Items Discharge Condition: Stable Ambulatory Status: Walker Discharge Destination: Home Transportation: Private Auto Accompanied By: daughter Schedule Follow-up Appointment: Yes Clinical Summary of Care: Patient Declined Electronic Signature(s) Signed: 11/13/2022 4:55:31 PM By: [name] RN, BSN Entered By: [name] on 11/13/2022 05:11:44 -------------------------------------------------------------------------------- Lower Extremity Assessment Details Patient Name: Date of Service: [name] Long, [name] Long 11/13/2022 7:30 A M Medical Record Number: [id number] Patient Account Number: [id number] Date of Birth/Sex: Treating RN: 05-23-1940 (82 y.o. [name] Long Primary Care Kailin Principato: PA Zenovia Jordan, [place] Other Clinician: Referring Gretel Cantu: Treating Amron Guerrette/Extender: Sherryl Manges in Treatment: 9 Edema Assessment Assessed: [Left: No] [Right: No] Edema: [Left: Ye] [Right: s] Calf Left: Right: Point of Measurement: From Medial Instep 32.5 cm Ankle Left: Right: Point of Measurement: From Medial Instep 23.5 cm Vascular Assessment Left: [[id number].pdf Page 4 of 10Right:] Pulses: Dorsalis Pedis Palpable: [id number].pdf Page 4 of 10Yes] Extremity colors, hair growth, and conditions: Extremity Color: [phone].pdf Page 4 of 10Hyperpigmented] Hair Growth on Extremity: [phone].pdf Page 4 of 10No] Temperature of Extremity: [phone].pdf Page 4 of 10Warm < 3 seconds] Electronic Signature(s) Signed: 11/13/2022 4:55:31 PM By: [name] RN, BSN Entered By: [name] on 11/13/2022  04:44:27 -------------------------------------------------------------------------------- Multi Wound Chart Details Patient Name: Date of Service: [name] Long, [name] Long 11/13/2022 7:30 A M Medical Record Number: [id number] Patient Account Number: [id number] Date of Birth/Sex: Treating RN: [birth year] (82 y.o. F) Primary Care Laruen Risser: PA TIENT, NO Other Clinician: Referring Ell Tiso: Treating Opaline Reyburn/Extender: Sherryl Manges in Treatment: 9 Vital Signs Height(in): 64 Pulse(bpm): 56 Weight(lbs): 162 Blood Pressure(mmHg): 178/72 Body Mass Index(BMI): 27.8 Temperature(F): 98.5 Respiratory Rate(breaths/min): 18 [1:Photos:] [N/A:N/A] Left, Lateral Lower Leg Left, Posterior Lower Leg N/A Wound Location: Hematoma Trauma N/A  Wounding Event: Diabetic Wound/Ulcer of the Lower Infection - not elsewhere classified N/A Primary Etiology: Extremity Arrhythmia, Congestive Heart Failure, Arrhythmia, Congestive Heart Failure, N/A Comorbid History: Hypertension, Type II Diabetes Hypertension, Type II Diabetes 08/05/2022 09/20/2022 N/A Date Acquired: 9 7 N/A Weeks of Treatment: Open Healed - Epithelialized N/A Wound Status: No No N/A Wound Recurrence: Yes No N/A Clustered Wound: 1.7x0.8x0.1 0x0x0 N/A Measurements L x W x D (cm) 1.068 0 N/A A (cm) : rea 0.107 0 N/A Volume (cm) : 97.00% 100.00% N/A % Reduction in A rea: 99.70% 100.00% N/A % Reduction in Volume: 9 Starting Position 1 (o'clock): 12 Ending Position 1 (o'clock): 0.4 Maximum Distance 1 (cm): Yes No N/A Undermining: Grade 2 Full Thickness Without Exposed N/A Classification: Support Structures Medium Small N/A Exudate Amount: Serosanguineous Serosanguineous N/A Exudate Type: red, brown red, brown N/A Exudate Color: Well defined, not attached N/A N/A Wound Margin: Large (67-100%) None Present (0%) N/A Granulation Amount: Red N/A N/A Granulation QualityDanelly Reidinger, [name] Long ([id number])  [id number] Page 5 of 10 None Present (0%) None Present (0%) N/A Necrotic Amount: Fat Layer (Subcutaneous Tissue): Yes Fascia: No N/A Exposed Structures: Fascia: No Fat Layer (Subcutaneous Tissue): No Tendon: No Tendon: No Muscle: No Muscle: No Joint: No Joint: No Bone: No Bone: No Small (1-33%) Large (67-100%) N/A Epithelialization: No Abnormalities Noted No Abnormalities Noted N/A Periwound Skin Texture: No Abnormalities Noted No Abnormalities Noted N/A Periwound Skin Moisture: Hemosiderin Staining: Yes Hemosiderin Staining: Yes N/A Periwound Skin Color: No Abnormality No Abnormality N/A Temperature: Treatment Notes Electronic Signature(s) Signed: 11/13/2022 8:10:21 AM By: Duanne Guess MD FACS Entered By: Duanne Guess on 11/13/2022 05:10:21 -------------------------------------------------------------------------------- Multi-Disciplinary Care Plan Details Patient Name: Date of Service: [name] Long, [name] Long 11/13/2022 7:30 A M Medical Record Number: [id number] Patient Account Number: [id number] Date of Birth/Sex: Treating RN: 1940-11-05 (82 y.o. [name] Long Primary Care Merridy Pascoe: PA Zenovia Jordan, [place] Other Clinician: Referring Keefer Soulliere: Treating Shenequa Howse/Extender: Sherryl Manges in Treatment: 9 Multidisciplinary Care Plan reviewed with physician Active Inactive Venous Leg Ulcer Nursing Diagnoses: Knowledge deficit related to disease process and management Potential for venous Insuffiency (use before diagnosis confirmed) Goals: Patient will maintain optimal edema control Date Initiated: 10/19/2022 Target Resolution Date: 12/18/2022 Goal Status: Active Interventions: Assess peripheral edema status every visit. Compression as ordered Treatment Activities: Therapeutic compression applied : 10/19/2022 Notes: Wound/Skin Impairment Nursing Diagnoses: Impaired tissue integrity Goals: Patient/caregiver will verbalize understanding of  skin care regimen Date Initiated: 09/06/2022 Target Resolution Date: 12/18/2022 Goal Status: Active Interventions: Assess patient/caregiver ability to obtain necessary supplies Assess patient/caregiver ability to perform ulcer/skin care regimen upon admission and as needed Assess ulceration(s) every visit Provide education on ulcer and skin care [place], [name] Long ([id number]) [id number] Page 6 of 10 Screen for HBO Treatment Activities: Skin care regimen initiated : 09/06/2022 Topical wound management initiated : 09/06/2022 Notes: Electronic Signature(s) Signed: 11/13/2022 4:55:31 PM By: [name] RN, BSN Entered By: [name] on 11/13/2022 04:56:27 -------------------------------------------------------------------------------- Pain Assessment Details Patient Name: Date of Service: [name] Long, [name] Long 11/13/2022 7:30 A M Medical Record Number: [id number] Patient Account Number: [id number] Date of Birth/Sex: Treating RN: 1940-04-01 (82 y.o. [name] Long Primary Care Odis Turck: PA Zenovia Jordan, [place] Other Clinician: Referring Neema Fluegge: Treating Maclane Holloran/Extender: Sherryl Manges in Treatment: 9 Active Problems Location of Pain Severity and Description of Pain Patient Has Paino No Site Locations Rate the pain. Current Pain Level: 0 Pain Management and Medication Current Pain Management: Electronic Signature(s) Signed: 11/13/2022 4:55:31 PM By: [name] RN, BSN Entered By: [name]  on 11/13/2022 04:40:08 -------------------------------------------------------------------------------- Patient/Caregiver Education Details Patient Name: Date of Service: [name] Long, [name] Long 9/9/2024andnbsp7:30 A M Medical Record Number: [id number] Patient Account Number: [id number] Date of Birth/Gender: Treating RN: 09-13-1940 (82 y.o. [name] Long Primary Care Physician: PA Zenovia Jordan, [place] Other Clinician: Referring Physician: Treating Physician/Extender: Jasey, Shelton, Nadeen ([id number]) [id number] Page 7 of 10 Weeks in Treatment: 9 Education Assessment Education Provided To: Patient Education Topics Provided Venous: Methods: Explain/Verbal Responses: Reinforcements needed, State content correctly Wound/Skin Impairment: Methods: Explain/Verbal Responses: Reinforcements needed, State content correctly Electronic Signature(s) Signed: 11/13/2022 4:55:31 PM By: [name] RN, BSN Entered By: [name] on 11/13/2022 04:57:29 -------------------------------------------------------------------------------- Wound Assessment Details Patient Name: Date of Service: [name] Long, [name] Long 11/13/2022 7:30 A M Medical Record Number: [id number] Patient Account Number: [id number] Date of Birth/Sex: Treating RN: 11-Feb-1941 (82 y.o. [name] Long Primary Care Dario Yono: PA Zenovia Jordan, [place] Other Clinician: Referring Chadd Tollison: Treating Tamiyah Moulin/Extender: Sherryl Manges in Treatment: 9 Wound Status Wound Number: 1 Primary Diabetic Wound/Ulcer of the Lower Extremity Etiology: Wound Location: Left, Lateral Lower Leg Wound Status: Open Wounding Event: Hematoma Comorbid Arrhythmia, Congestive Heart Failure, Hypertension, Type II Date Acquired: 08/05/2022 History: Diabetes Weeks Of Treatment: 9 Clustered Wound: Yes Photos Wound Measurements Length: (cm) 1.7 Width: (cm) 0.8 Depth: (cm) 0.1 Area: (cm) 1.068 Volume: (cm) 0.107 % Reduction in Area: 97% % Reduction in Volume: 99.7% Epithelialization: Small (1-33%) Tunneling: No Undermining: Yes Starting Position (o'clock): 9 Ending Position (o'clock): 12 Maximum Distance: (cm) 0.4 Wound Description [place], Liara ([id number]) Classification: Grade 2 Wound Margin: Well defined, not attached Exudate Amount: Medium Exudate Type: Serosanguineous Exudate Color: red, brown [id number] Page 8 of 10 Foul Odor After Cleansing: No Slough/Fibrino  No Wound Bed Granulation Amount: Large (67-100%) Exposed Structure Granulation Quality: Red Fascia Exposed: No Necrotic Amount: None Present (0%) Fat Layer (Subcutaneous Tissue) Exposed: Yes Tendon Exposed: No Muscle Exposed: No Joint Exposed: No Bone Exposed: No Periwound Skin Texture Texture Color No Abnormalities Noted: Yes No Abnormalities Noted: No Hemosiderin Staining: Yes Moisture No Abnormalities Noted: Yes Temperature / Pain Temperature: No Abnormality Treatment Notes Wound #1 (Lower Leg) Wound Laterality: Left, Lateral Cleanser Soap and Water Discharge Instruction: May shower and wash wound with dial antibacterial soap and water prior to dressing change. Vashe 5.8 (oz) Discharge Instruction: Cleanse the wound with Vashe prior to applying a clean dressing using gauze sponges, not tissue or cotton balls. Peri-Wound Care Topical Primary Dressing Promogran Prisma Matrix, 4.34 (sq in) (silver collagen) Discharge Instruction: Moisten collagen with saline and place at base of tunnel Secondary Dressing Woven Gauze Sponge, Non-Sterile 4x4 in Discharge Instruction: Apply over primary dressing as directed. Secured With Compression Wrap Kerlix Roll 4.5x3.1 (in/yd) Discharge Instruction: Apply Kerlix and Coban compression as directed. Coban Self-Adherent Wrap 4x5 (in/yd) Discharge Instruction: Apply over Kerlix as directed. Compression Stockings Add-Ons Electronic Signature(s) Signed: 11/13/2022 4:55:31 PM By: [name] RN, BSN Entered By: [name] on 11/13/2022 04:50:36 -------------------------------------------------------------------------------- Wound Assessment Details Patient Name: Date of Service: [name] Long, [name] Long 11/13/2022 7:30 A M Medical Record Number: [id number] Patient Account Number: [id number] Date of Birth/Sex: Treating RN: [birth year] (82 y.o. [name] Long Primary Care Yani Lal: PA Zenovia Jordan, [place] Other Clinician: Referring Danicia Terhaar: Treating  Merranda Bolls/Extender: Duanne Guess [place], [name] Long ([id number]) [id number] Page 9 of 10 Weeks in Treatment: 9 Wound Status Wound Number: 3 Primary Infection - not elsewhere classified Etiology: Wound Location: Left, Posterior Lower Leg Wound Status: Healed - Epithelialized Wounding Event: Trauma Comorbid Arrhythmia, Congestive Heart Failure, Hypertension, Type II Date Acquired:  09/20/2022 History: Diabetes Weeks Of Treatment: 7 Clustered Wound: No Photos Wound Measurements Length: (cm) Width: (cm) Depth: (cm) Area: (cm) Volume: (cm) 0 % Reduction in Area: 100% 0 % Reduction in Volume: 100% 0 Epithelialization: Large (67-100%) 0 Tunneling: No 0 Undermining: No Wound Description Classification: Full Thickness Without Exposed Support Structures Exudate Amount: Small Exudate Type: Serosanguineous Exudate Color: red, brown Foul Odor After Cleansing: No Slough/Fibrino No Wound Bed Granulation Amount: None Present (0%) Exposed Structure Necrotic Amount: None Present (0%) Fascia Exposed: No Fat Layer (Subcutaneous Tissue) Exposed: No Tendon Exposed: No Muscle Exposed: No Joint Exposed: No Bone Exposed: No Periwound Skin Texture Texture Color No Abnormalities Noted: Yes No Abnormalities Noted: No Hemosiderin Staining: Yes Moisture No Abnormalities Noted: Yes Temperature / Pain Temperature: No Abnormality Electronic Signature(s) Signed: 11/13/2022 4:55:31 PM By: [name] RN, BSN Entered By: [name] on 11/13/2022 04:51:19 -------------------------------------------------------------------------------- Vitals Details Patient Name: Date of Service: [name] Long, [name] Long 11/13/2022 7:30 A M Medical Record Number: [id number] Patient Account Number: [id number] Date of Birth/Sex: Treating RN: 05-04-1940 (82 y.o. [name] Long Primary Care Ariell Gunnels: PA Zenovia Jordan, [place] Other Clinician: Referring Nazanin Kinner: Treating Theus Espin/Extender: Duanne Guess [place], [name] Long ([id number]) [id number] Page 10 of 10 Weeks in Treatment: 9 Vital Signs Time Taken: 07:39 Temperature (F): 98.5 Height (in): 64 Pulse (bpm): 56 Weight (lbs): 162 Respiratory Rate (breaths/min): 18 Body Mass Index (BMI): 27.8 Blood Pressure (mmHg): 178/72 Reference Range: 80 - 120 mg / dl Electronic Signature(s) Signed: 11/13/2022 4:55:31 PM By: [name] RN, BSN Entered By: [name] on 11/13/2022 04:39:41

## 2022-11-16 ENCOUNTER — Ambulatory Visit (HOSPITAL_BASED_OUTPATIENT_CLINIC_OR_DEPARTMENT_OTHER): Payer: Medicare Other | Admitting: General Surgery

## 2022-11-20 ENCOUNTER — Encounter (HOSPITAL_BASED_OUTPATIENT_CLINIC_OR_DEPARTMENT_OTHER): Payer: Medicare Other | Admitting: General Surgery

## 2022-11-20 DIAGNOSIS — E11622 Type 2 diabetes mellitus with other skin ulcer: Secondary | ICD-10-CM | POA: Diagnosis not present

## 2022-11-20 NOTE — Progress Notes (Signed)
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, Congestive Heart Failure, N/A N/A Comorbid History: Hypertension, Type II Diabetes 08/05/2022 N/A N/A Date Acquired: 10 N/A N/A Weeks of Treatment: Open N/A N/A Wound Status: No N/A N/A Wound Recurrence: Yes N/A N/A Clustered Wound: 1x0.4x0.1 N/A N/A Measurements L x W x D (cm) 0.314 N/A N/A A (cm) : rea 0.031 N/A N/A Volume (cm) : 99.10% N/A N/A % Reduction in A rea: 99.90% N/A N/A % Reduction in Volume: Grade 2 N/A N/A Classification: Small N/A N/A Exudate A mount: Serosanguineous N/A N/A Exudate Type: red, brown N/A N/A Exudate Color: Distinct, outline attached N/A N/A Wound Margin: Large (67-100%) N/A N/A Granulation A mount: Red N/A N/A Granulation Quality: None Present (0%) N/A N/A Necrotic A mount: Fat Layer (Subcutaneous Tissue): Yes N/A N/A Exposed Structures: Fascia: No Tendon: No Muscle: No Denhoff, Hedwig ([id number]) [id number].pdf Page 5 of 9 Joint: No Bone: No None N/A N/A Epithelialization: No Abnormalities Noted N/A N/A Periwound Skin Texture: No Abnormalities Noted N/A N/A Periwound Skin Moisture: Hemosiderin Staining:  Yes N/A N/A Periwound Skin Color: No Abnormality N/A N/A Temperature: Treatment Notes Electronic Signature(s) Signed: 11/20/2022 7:59:11 AM By: Duanne Guess MD FACS Entered By: Duanne Guess on 11/20/2022 04:59:11 -------------------------------------------------------------------------------- Multi-Disciplinary Care Plan Details Patient Name: Date of Service: [name] Long, [name] Long 11/20/2022 7:30 A M Medical Record Number: [id number] Patient Account Number: [id number] Date of Birth/Sex: Treating RN: May 11, 1940 (82 y.o. [name] [name]: PA Zenovia Jordan, [place] Other Clinician: Referring Becci Batty: Treating Garren Greenman/Extender: Sherryl Manges in Treatment: 10 Multidisciplinary Care Plan reviewed with physician Active Inactive Venous Leg Ulcer Nursing Diagnoses: Knowledge deficit related to disease process and management Potential for venous Insuffiency (use before diagnosis confirmed) Goals: Patient will maintain optimal edema control Date Initiated: 10/19/2022 Target Resolution Date: 12/18/2022 Goal Status: Active Interventions: Assess peripheral edema status every visit. Compression as ordered Treatment Activities: Therapeutic compression applied : 10/19/2022 Notes: Wound/Skin Impairment Nursing Diagnoses: Impaired tissue integrity Goals: Patient/caregiver will verbalize understanding of skin care regimen Date Initiated: 09/06/2022 Target Resolution Date: 12/18/2022 Goal Status: Active Interventions: Assess patient/caregiver ability to obtain necessary supplies Assess patient/caregiver ability to perform ulcer/skin care regimen upon admission and as needed Assess ulceration(s) every visit Provide education on ulcer and skin care Screen for HBO Treatment Activities: Skin care regimen initiated : 09/06/2022 [name], [name] ([id number]) [phone].pdf Page 6 of 9 Topical wound management initiated : 09/06/2022 Notes: Electronic  Signature(s) Signed: 11/20/2022 4:35:53 PM By: [name] RN, BSN Entered By: [name] on 11/20/2022 04:53:13 -------------------------------------------------------------------------------- Pain Assessment Details Patient Name: Date of Service: [name] Long, [name] Long 11/20/2022 7:30 A M Medical Record Number: [id number] Patient Account Number: [id number] Date of Birth/Sex: Treating RN: 21-Nov-1940 (82 y.o. [name] [name]: PA Zenovia Jordan, [place] Other Clinician: Referring [name]: Treating [name]/Extender: Sherryl Manges in Treatment: 10 Active Problems Location of Pain Severity and Description of Pain Patient Has Paino No Site Locations Rate the pain. Current Pain Level: 0 Pain Management and Medication Current Pain Management: Electronic Signature(s) Signed: 11/20/2022 4:35:53 PM By: [name] RN, BSN Entered By: [name] on 11/20/2022 04:44:07 -------------------------------------------------------------------------------- Patient/Caregiver Education Details Patient Name: Date of Service: [name] Long, [name] 9/16/2024andnbsp7:30 A M Medical Record Number: [id number] Patient Account Number: [id number] Date of Birth/Gender: Treating RN: September 17, 1940 (82 y.o. [name] Physician: PA Zenovia Jordan, [place] Other Clinician: Referring Physician: Treating Physician/Extender: Sherryl Manges in Treatment: [address] [place], [name] ([id number]) [id number] Page 7 of  9 Education Provided To: Patient Education Topics Provided Venous: Methods: Explain/Verbal Responses: Reinforcements needed, State content correctly Wound/Skin Impairment: Methods: Explain/Verbal Responses: Reinforcements needed, State content correctly Electronic Signature(s) Signed: 11/20/2022 4:35:53 PM By: [name] RN, BSN Entered By: [name] on 11/20/2022  04:53:34 -------------------------------------------------------------------------------- Wound Assessment Details Patient Name: Date of Service: [name] Long, [name] Long 11/20/2022 7:30 A M Medical Record Number: [id number] Patient Account Number: [id number] Date of Birth/Sex: Treating RN: December 06, 1940 (82 y.o. [name] [name]: PA Zenovia Jordan, [place] Other Clinician: Referring [name]: Treating [name]/Extender: Sherryl Manges in Treatment: 10 Wound Status Wound Number: 1 Primary Diabetic Wound/Ulcer of the Lower Extremity Etiology: Wound Location: Left, Lateral Lower Leg Wound Status: Open Wounding Event: Hematoma Comorbid Arrhythmia, Congestive Heart Failure, Hypertension, Type II Date Acquired: 08/05/2022 History: Diabetes Weeks Of Treatment: 10 Clustered Wound: Yes Photos Wound Measurements Length: (cm) 1 Width: (cm) 0.4 Depth: (cm) 0.1 Area: (cm) 0.314 Volume: (cm) 0.031 % Reduction in Area: 99.1% % Reduction in Volume: 99.9% Epithelialization: None Tunneling: No Undermining: No Wound Description Classification: Grade 2 Wound Margin: Distinct, outline attached Exudate Amount: Small Exudate Type: Serosanguineous Exudate Color: red, brown Foul Odor After Cleansing: No Slough/Fibrino No Wound Bed [name] Long, [name] Long ([id number]) [id number].pdf Page 8 of 9 Granulation Amount: Large (67-100%) Exposed Structure Granulation Quality: Red Fascia Exposed: No Necrotic Amount: None Present (0%) Fat Layer (Subcutaneous Tissue) Exposed: Yes Tendon Exposed: No Muscle Exposed: No Joint Exposed: No Bone Exposed: No Periwound Skin Texture Texture Color No Abnormalities Noted: Yes No Abnormalities Noted: No Hemosiderin Staining: Yes Moisture No Abnormalities Noted: Yes Temperature / Pain Temperature: No Abnormality Treatment Notes Wound #1 (Lower Leg) Wound Laterality: Left, Lateral Cleanser Soap and Water Discharge Instruction: May shower  and wash wound with dial antibacterial soap and water prior to dressing change. Vashe 5.8 (oz) Discharge Instruction: Cleanse the wound with Vashe prior to applying a clean dressing using gauze sponges, not tissue or cotton balls. Peri-Wound Care Topical Primary Dressing Promogran Prisma Matrix, 4.34 (sq in) (silver collagen) Discharge Instruction: Moisten collagen with saline and place at base of tunnel Secondary Dressing Woven Gauze Sponge, Non-Sterile 4x4 in Discharge Instruction: Apply over primary dressing as directed. Secured With Compression Wrap Kerlix Roll 4.5x3.1 (in/yd) Discharge Instruction: Apply Kerlix and Coban compression as directed. Coban Self-Adherent Wrap 4x5 (in/yd) Discharge Instruction: Apply over Kerlix as directed. Compression Stockings Add-Ons Electronic Signature(s) Signed: 11/20/2022 4:35:53 PM By: [name] RN, BSN Entered By: [name] on 11/20/2022 04:51:37 -------------------------------------------------------------------------------- Vitals Details Patient Name: Date of Service: [name] Long, [name] Long 11/20/2022 7:30 A M Medical Record Number: [id number] Patient Account Number: [id number] Date of Birth/Sex: Treating RN: 1940-06-11 (82 y.o. [name] Lamaya Hyneman: PA Zenovia Jordan, [place] Other Clinician: Referring Emerly Prak: Treating Orel Hord/Extender: Sherryl Manges in Treatment: 10 Vital Signs Time Taken: 07:40 Temperature (F): 98.3 Height (in): 64 Pulse (bpm): 45 Weight (lbs): 162 Respiratory Rate (breaths/min): 18 [name] Long, [name] Long ([id number]) [phone].pdf Page 9 of 9 Body Mass Index (BMI): 27.8 Blood Pressure (mmHg): 168/71 Reference Range: 80 - 120 mg / dl Electronic Signature(s) Signed: 11/20/2022 4:35:53 PM By: [name] RN, BSN Entered By: [name] on 11/20/2022 04:43:33  9 Education Provided To: Patient Education Topics Provided Venous: Methods: Explain/Verbal Responses: Reinforcements needed, State content correctly Wound/Skin Impairment: Methods: Explain/Verbal Responses: Reinforcements needed, State content correctly Electronic Signature(s) Signed: 11/20/2022 4:35:53 PM By: [name] RN, BSN Entered By: [name] on 11/20/2022  04:53:34 -------------------------------------------------------------------------------- Wound Assessment Details Patient Name: Date of Service: [name] Long, [name] Long 11/20/2022 7:30 A M Medical Record Number: [id number] Patient Account Number: [id number] Date of Birth/Sex: Treating RN: December 06, 1940 (82 y.o. [name] [name]: PA Zenovia Jordan, [place] Other Clinician: Referring [name]: Treating [name]/Extender: Sherryl Manges in Treatment: 10 Wound Status Wound Number: 1 Primary Diabetic Wound/Ulcer of the Lower Extremity Etiology: Wound Location: Left, Lateral Lower Leg Wound Status: Open Wounding Event: Hematoma Comorbid Arrhythmia, Congestive Heart Failure, Hypertension, Type II Date Acquired: 08/05/2022 History: Diabetes Weeks Of Treatment: 10 Clustered Wound: Yes Photos Wound Measurements Length: (cm) 1 Width: (cm) 0.4 Depth: (cm) 0.1 Area: (cm) 0.314 Volume: (cm) 0.031 % Reduction in Area: 99.1% % Reduction in Volume: 99.9% Epithelialization: None Tunneling: No Undermining: No Wound Description Classification: Grade 2 Wound Margin: Distinct, outline attached Exudate Amount: Small Exudate Type: Serosanguineous Exudate Color: red, brown Foul Odor After Cleansing: No Slough/Fibrino No Wound Bed [name] Long, [name] Long ([id number]) [id number] Page 8 of 9 Granulation Amount: Large (67-100%) Exposed Structure Granulation Quality: Red Fascia Exposed: No Necrotic Amount: None Present (0%) Fat Layer (Subcutaneous Tissue) Exposed: Yes Tendon Exposed: No Muscle Exposed: No Joint Exposed: No Bone Exposed: No Periwound Skin Texture Texture Color No Abnormalities Noted: Yes No Abnormalities Noted: No Hemosiderin Staining: Yes Moisture No Abnormalities Noted: Yes Temperature / Pain Temperature: No Abnormality Treatment Notes Wound #1 (Lower Leg) Wound Laterality: Left, Lateral Cleanser Soap and Water Discharge Instruction: May shower  and wash wound with dial antibacterial soap and water prior to dressing change. Vashe 5.8 (oz) Discharge Instruction: Cleanse the wound with Vashe prior to applying a clean dressing using gauze sponges, not tissue or cotton balls. Peri-Wound Care Topical Primary Dressing Promogran Prisma Matrix, 4.34 (sq in) (silver collagen) Discharge Instruction: Moisten collagen with saline and place at base of tunnel Secondary Dressing Woven Gauze Sponge, Non-Sterile 4x4 in Discharge Instruction: Apply over primary dressing as directed. Secured With Compression Wrap Kerlix Roll 4.5x3.1 (in/yd) Discharge Instruction: Apply Kerlix and Coban compression as directed. Coban Self-Adherent Wrap 4x5 (in/yd) Discharge Instruction: Apply over Kerlix as directed. Compression Stockings Add-Ons Electronic Signature(s) Signed: 11/20/2022 4:35:53 PM By: [name] RN, BSN Entered By: [name] on 11/20/2022 04:51:37 -------------------------------------------------------------------------------- Vitals Details Patient Name: Date of Service: [name] Long, [name] Long 11/20/2022 7:30 A M Medical Record Number: [id number] Patient Account Number: [id number] Date of Birth/Sex: Treating RN: 1940-06-11 (82 y.o. [name] Lamaya Hyneman: PA Zenovia Jordan, [place] Other Clinician: Referring Emerly Prak: Treating Orel Hord/Extender: Sherryl Manges in Treatment: 10 Vital Signs Time Taken: 07:40 Temperature (F): 98.3 Height (in): 64 Pulse (bpm): 45 Weight (lbs): 162 Respiratory Rate (breaths/min): 18 [name] Long, [name] Long ([id number]) [phone].pdf Page 9 of 9 Body Mass Index (BMI): 27.8 Blood Pressure (mmHg): 168/71 Reference Range: 80 - 120 mg / dl Electronic Signature(s) Signed: 11/20/2022 4:35:53 PM By: [name] RN, BSN Entered By: [name] on 11/20/2022 04:43:33  9 Education Provided To: Patient Education Topics Provided Venous: Methods: Explain/Verbal Responses: Reinforcements needed, State content correctly Wound/Skin Impairment: Methods: Explain/Verbal Responses: Reinforcements needed, State content correctly Electronic Signature(s) Signed: 11/20/2022 4:35:53 PM By: [name] RN, BSN Entered By: [name] on 11/20/2022  04:53:34 -------------------------------------------------------------------------------- Wound Assessment Details Patient Name: Date of Service: [name] Long, [name] Long 11/20/2022 7:30 A M Medical Record Number: [id number] Patient Account Number: [id number] Date of Birth/Sex: Treating RN: December 06, 1940 (82 y.o. [name] [name]: PA Zenovia Jordan, [place] Other Clinician: Referring [name]: Treating [name]/Extender: Sherryl Manges in Treatment: 10 Wound Status Wound Number: 1 Primary Diabetic Wound/Ulcer of the Lower Extremity Etiology: Wound Location: Left, Lateral Lower Leg Wound Status: Open Wounding Event: Hematoma Comorbid Arrhythmia, Congestive Heart Failure, Hypertension, Type II Date Acquired: 08/05/2022 History: Diabetes Weeks Of Treatment: 10 Clustered Wound: Yes Photos Wound Measurements Length: (cm) 1 Width: (cm) 0.4 Depth: (cm) 0.1 Area: (cm) 0.314 Volume: (cm) 0.031 % Reduction in Area: 99.1% % Reduction in Volume: 99.9% Epithelialization: None Tunneling: No Undermining: No Wound Description Classification: Grade 2 Wound Margin: Distinct, outline attached Exudate Amount: Small Exudate Type: Serosanguineous Exudate Color: red, brown Foul Odor After Cleansing: No Slough/Fibrino No Wound Bed [name] Long, [name] Long ([id number]) [id number].pdf Page 8 of 9 Granulation Amount: Large (67-100%) Exposed Structure Granulation Quality: Red Fascia Exposed: No Necrotic Amount: None Present (0%) Fat Layer (Subcutaneous Tissue) Exposed: Yes Tendon Exposed: No Muscle Exposed: No Joint Exposed: No Bone Exposed: No Periwound Skin Texture Texture Color No Abnormalities Noted: Yes No Abnormalities Noted: No Hemosiderin Staining: Yes Moisture No Abnormalities Noted: Yes Temperature / Pain Temperature: No Abnormality Treatment Notes Wound #1 (Lower Leg) Wound Laterality: Left, Lateral Cleanser Soap and Water Discharge Instruction: May shower  and wash wound with dial antibacterial soap and water prior to dressing change. Vashe 5.8 (oz) Discharge Instruction: Cleanse the wound with Vashe prior to applying a clean dressing using gauze sponges, not tissue or cotton balls. Peri-Wound Care Topical Primary Dressing Promogran Prisma Matrix, 4.34 (sq in) (silver collagen) Discharge Instruction: Moisten collagen with saline and place at base of tunnel Secondary Dressing Woven Gauze Sponge, Non-Sterile 4x4 in Discharge Instruction: Apply over primary dressing as directed. Secured With Compression Wrap Kerlix Roll 4.5x3.1 (in/yd) Discharge Instruction: Apply Kerlix and Coban compression as directed. Coban Self-Adherent Wrap 4x5 (in/yd) Discharge Instruction: Apply over Kerlix as directed. Compression Stockings Add-Ons Electronic Signature(s) Signed: 11/20/2022 4:35:53 PM By: [name] RN, BSN Entered By: [name] on 11/20/2022 04:51:37 -------------------------------------------------------------------------------- Vitals Details Patient Name: Date of Service: [name] Long, [name] Long 11/20/2022 7:30 A M Medical Record Number: [id number] Patient Account Number: [id number] Date of Birth/Sex: Treating RN: 1940-06-11 (82 y.o. [name] Lamaya Hyneman: PA Zenovia Jordan, [place] Other Clinician: Referring Emerly Prak: Treating Orel Hord/Extender: Sherryl Manges in Treatment: 10 Vital Signs Time Taken: 07:40 Temperature (F): 98.3 Height (in): 64 Pulse (bpm): 45 Weight (lbs): 162 Respiratory Rate (breaths/min): 18 [name] Long, [name] Long ([id number]) [phone].pdf Page 9 of 9 Body Mass Index (BMI): 27.8 Blood Pressure (mmHg): 168/71 Reference Range: 80 - 120 mg / dl Electronic Signature(s) Signed: 11/20/2022 4:35:53 PM By: [name] RN, BSN Entered By: [name] on 11/20/2022 04:43:33

## 2022-11-20 NOTE — Progress Notes (Addendum)
decreased  undermining. 10/02/2022: There is a little bit of slough on the posterior wound. The lateral leg wound continues to contract at a remarkable rate. It is clean without any slough or other debris accumulation. 10/09/2022: The posterior wound is about half the size as it was last week. The lateral leg wound has contracted remarkably. There is still some undermining from 9-12 o'clock. Everything is very clean. [place], [place] ([id number]) [id number] Page 2 of 7 10/19/2022: Both wounds have contracted considerably. Once again, the posterior wound is about half the size as it was last week. The tunnel is down to about a centimeter. Everything is extremely clean. Edema control is good. 10/23/2022: The posterior wound continues to contract at a remarkable rate. She has had some bleeding from the anterior wound over the last 24 hours to the extent that it clogged the tubing for her wound VAC.. It appears to be coming from the small residual tunneled portion of the wound. No concern for infection. 10/30/2022: The posterior wound is nearly closed. The anterior wound had a little bit of oozing but not to the degree as last week. The tunnel is about the same depth while the orifice has contracted a little bit. No concern for infection. 11/07/2022: The posterior wound is healed. There is a tiny blister overlying the epithelialized surface. The anterior wound tunnel has contracted substantially. The wound surface is clean and healthy-looking. 11/13/2022: The blister over the posterior wound has resolved. The anterior wound just has about 0.4 cm of undermining. Everything is clean and healthy-looking. 11/20/2022: There is no longer any undermining and the wound is about half the size that it was last week. Healthy granulation tissue on the surface without any slough or other debris accumulation. Electronic Signature(s) Signed: 11/20/2022 8:00:00 AM By: Duanne Guess MD FACS Entered By: Duanne Guess on 11/20/2022 05:00:00 -------------------------------------------------------------------------------- Physical Exam Details Patient Name: Date of Service: [name] Long, [name] Long 11/20/2022 7:30 A M Medical Record Number: [id number] Patient Account Number: [id number] Date of Birth/Sex: Treating RN: Sep 01, 1940 (82 y.o. F) Primary Care Provider: PA Zenovia Jordan, NO Other Clinician: Referring Provider: Treating Provider/Extender: Sherryl Manges in Treatment: 10 Constitutional Hypertensive, asymptomatic. Bradycardic, asymptomatic. . . no acute distress. Respiratory Normal work of breathing on room air. Notes 11/20/2022: There is no longer any undermining and the wound is about half the size that it was last week. Healthy granulation tissue on the surface without any slough or other debris accumulation. Electronic Signature(s) Signed: 11/20/2022 8:03:10 AM By: Duanne Guess MD FACS Entered By: Duanne Guess on 11/20/2022 05:03:10 -------------------------------------------------------------------------------- Physician Orders Details Patient Name: Date of Service: [name] Long, [name] Long 11/20/2022 7:30 A M Medical Record Number: [id number] Patient Account Number: [id number] Date of Birth/Sex: Treating RN: 05-Mar-1941 (82 y.o. [name] Provider: PA Zenovia Jordan, [place] Other Clinician: Referring Provider: Treating Provider/Extender: Sherryl Manges in Treatment: 10 The following information was scribed by: [name] The information was scribed for: Duanne Guess Verbal / Phone Orders: No Diagnosis Coding ICD-10 Coding Code Description KASSITY, CROUSHORE ([id number]) [id number] Page 3 of 7 [phone] 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 Z79.01 Long term (current) use of anticoagulants I50.42 Chronic combined systolic (congestive) and diastolic  (congestive) heart failure N18.30 Chronic kidney disease, stage 3 unspecified Follow-up Appointments ppointment in 1 week. - Dr. Lady Gary - RM 1 Return A Tues 9/23 at 07:30AM Anesthetic (In clinic) Topical Lidocaine 4% applied to  [place], [place] ([id number]) [id number] Page 1 of 7 Visit Report for 11/20/2022 Chief Complaint Document Details Patient Name: Date of Service: [name] Long, [name] Long 11/20/2022 7:30 A M Medical Record Number: [id number] Patient Account Number: [id number] Date of Birth/Sex: Treating RN: [birth year] (83 y.o. F) Primary Care Provider: PA Zenovia Jordan, NO Other Clinician: Referring Provider: Treating Provider/Extender: Sherryl Manges in Treatment: 10 Information Obtained from: Patient Chief Complaint Patient seen for complaints of Non-Healing Wound. Electronic Signature(s) Signed: 11/20/2022 7:59:17 AM By: Duanne Guess MD FACS Entered By: Duanne Guess on 11/20/2022 04:59:16 -------------------------------------------------------------------------------- HPI Details Patient Name: Date of Service: [name] Long, [name] Long 11/20/2022 7:30 A M Medical Record Number: [id number] Patient Account Number: [id number] Date of Birth/Sex: Treating RN: July 02, 1940 (82 y.o. F) Primary Care Provider: PA TIENT, NO Other Clinician: Referring Provider: Treating Provider/Extender: Sherryl Manges in Treatment: 10 History of Present Illness HPI Description: ADMISSION 09/06/2022 This is an 82 year old type II diabetic (no A1c to review and patient is unaware of her last value) with a history of atrial fibrillation on chronic anticoagulant therapy, lower extremity edema, congestive heart failure, and stage III kidney disease. She divides her time between her home in [place] and here in [place] where her daughter lives. While in [place], she struck her leg on her bed frame and developed a large hematoma. She was evaluated in the emergency department there and imaging was negative for any deeper injury, such as a fracture. She returned to [facility] and has had an urgent care and emergency department visit to evaluate the site. As there had been no significant improvement in her wound, she  was referred to the wound care center for further evaluation and management. I am unable to see any evidence of an attempt to evacuate the hematoma in the electronic medical record.. There are 2 openings in her skin, but they connect to each other with significant tunneling and undermining. I am unsure if these opened spontaneously or there was a non- documented attempt to drain the hematoma site. The majority of her left lower leg from below the tibial tuberosity to just above the ankle and from the anterior tibia wrapping laterally and posteriorly to nearly the medial aspect of her leg is involved. There is a foul odor coming from her wound. There is no frank pus but there is significant retained clot that has likely served as culture media. ABI in clinic today was 0.84. 09/08/2022: The patient came in today as an add-on due to bloody drainage that was saturating her dressing. On inspection, it appeared that there was no frank fresh blood and no active site of bleeding identified; it appears that more of the hematoma was liquefied and draining from her wound. 09/13/2022: The wound has cleaned up considerably. There is no longer any foul odor and the drainage has decreased significantly. The patient is also complaining of less pain. There is still some nonviable subcutaneous tissue and fat present but overall everything is improved from the last visit. Her culture grew out a polymicrobial population of multiple drug-resistant organisms. She is currently taking Augmentin and levofloxacin as suggested by the culture data. 09/20/2022: The cavity is contracting nicely. There is no longer a connection between the posterior wound and the lateral leg wound. The posterior wound has a little bit of slough on the surface but the larger main wound is very clean. She has been approved for a wound VAC and we will apply that today. 09/25/2022: Post Wound sloughly- alginate Ag. Vac on lateral wound;  [place], [place] ([id number]) [id number] Page 1 of 7 Visit Report for 11/20/2022 Chief Complaint Document Details Patient Name: Date of Service: [name] Long, [name] Long 11/20/2022 7:30 A M Medical Record Number: [id number] Patient Account Number: [id number] Date of Birth/Sex: Treating RN: [birth year] (83 y.o. F) Primary Care Provider: PA Zenovia Jordan, NO Other Clinician: Referring Provider: Treating Provider/Extender: Sherryl Manges in Treatment: 10 Information Obtained from: Patient Chief Complaint Patient seen for complaints of Non-Healing Wound. Electronic Signature(s) Signed: 11/20/2022 7:59:17 AM By: Duanne Guess MD FACS Entered By: Duanne Guess on 11/20/2022 04:59:16 -------------------------------------------------------------------------------- HPI Details Patient Name: Date of Service: [name] Long, [name] Long 11/20/2022 7:30 A M Medical Record Number: [id number] Patient Account Number: [id number] Date of Birth/Sex: Treating RN: July 02, 1940 (82 y.o. F) Primary Care Provider: PA TIENT, NO Other Clinician: Referring Provider: Treating Provider/Extender: Sherryl Manges in Treatment: 10 History of Present Illness HPI Description: ADMISSION 09/06/2022 This is an 82 year old type II diabetic (no A1c to review and patient is unaware of her last value) with a history of atrial fibrillation on chronic anticoagulant therapy, lower extremity edema, congestive heart failure, and stage III kidney disease. She divides her time between her home in [place] and here in [place] where her daughter lives. While in [place], she struck her leg on her bed frame and developed a large hematoma. She was evaluated in the emergency department there and imaging was negative for any deeper injury, such as a fracture. She returned to [facility] and has had an urgent care and emergency department visit to evaluate the site. As there had been no significant improvement in her wound, she  was referred to the wound care center for further evaluation and management. I am unable to see any evidence of an attempt to evacuate the hematoma in the electronic medical record.. There are 2 openings in her skin, but they connect to each other with significant tunneling and undermining. I am unsure if these opened spontaneously or there was a non- documented attempt to drain the hematoma site. The majority of her left lower leg from below the tibial tuberosity to just above the ankle and from the anterior tibia wrapping laterally and posteriorly to nearly the medial aspect of her leg is involved. There is a foul odor coming from her wound. There is no frank pus but there is significant retained clot that has likely served as culture media. ABI in clinic today was 0.84. 09/08/2022: The patient came in today as an add-on due to bloody drainage that was saturating her dressing. On inspection, it appeared that there was no frank fresh blood and no active site of bleeding identified; it appears that more of the hematoma was liquefied and draining from her wound. 09/13/2022: The wound has cleaned up considerably. There is no longer any foul odor and the drainage has decreased significantly. The patient is also complaining of less pain. There is still some nonviable subcutaneous tissue and fat present but overall everything is improved from the last visit. Her culture grew out a polymicrobial population of multiple drug-resistant organisms. She is currently taking Augmentin and levofloxacin as suggested by the culture data. 09/20/2022: The cavity is contracting nicely. There is no longer a connection between the posterior wound and the lateral leg wound. The posterior wound has a little bit of slough on the surface but the larger main wound is very clean. She has been approved for a wound VAC and we will apply that today. 09/25/2022: Post Wound sloughly- alginate Ag. Vac on lateral wound;  [place], [place] ([id number]) [id number] Page 1 of 7 Visit Report for 11/20/2022 Chief Complaint Document Details Patient Name: Date of Service: [name] Long, [name] Long 11/20/2022 7:30 A M Medical Record Number: [id number] Patient Account Number: [id number] Date of Birth/Sex: Treating RN: [birth year] (83 y.o. F) Primary Care Provider: PA Zenovia Jordan, NO Other Clinician: Referring Provider: Treating Provider/Extender: Sherryl Manges in Treatment: 10 Information Obtained from: Patient Chief Complaint Patient seen for complaints of Non-Healing Wound. Electronic Signature(s) Signed: 11/20/2022 7:59:17 AM By: Duanne Guess MD FACS Entered By: Duanne Guess on 11/20/2022 04:59:16 -------------------------------------------------------------------------------- HPI Details Patient Name: Date of Service: [name] Long, [name] Long 11/20/2022 7:30 A M Medical Record Number: [id number] Patient Account Number: [id number] Date of Birth/Sex: Treating RN: July 02, 1940 (82 y.o. F) Primary Care Provider: PA TIENT, NO Other Clinician: Referring Provider: Treating Provider/Extender: Sherryl Manges in Treatment: 10 History of Present Illness HPI Description: ADMISSION 09/06/2022 This is an 82 year old type II diabetic (no A1c to review and patient is unaware of her last value) with a history of atrial fibrillation on chronic anticoagulant therapy, lower extremity edema, congestive heart failure, and stage III kidney disease. She divides her time between her home in [place] and here in [place] where her daughter lives. While in [place], she struck her leg on her bed frame and developed a large hematoma. She was evaluated in the emergency department there and imaging was negative for any deeper injury, such as a fracture. She returned to [facility] and has had an urgent care and emergency department visit to evaluate the site. As there had been no significant improvement in her wound, she  was referred to the wound care center for further evaluation and management. I am unable to see any evidence of an attempt to evacuate the hematoma in the electronic medical record.. There are 2 openings in her skin, but they connect to each other with significant tunneling and undermining. I am unsure if these opened spontaneously or there was a non- documented attempt to drain the hematoma site. The majority of her left lower leg from below the tibial tuberosity to just above the ankle and from the anterior tibia wrapping laterally and posteriorly to nearly the medial aspect of her leg is involved. There is a foul odor coming from her wound. There is no frank pus but there is significant retained clot that has likely served as culture media. ABI in clinic today was 0.84. 09/08/2022: The patient came in today as an add-on due to bloody drainage that was saturating her dressing. On inspection, it appeared that there was no frank fresh blood and no active site of bleeding identified; it appears that more of the hematoma was liquefied and draining from her wound. 09/13/2022: The wound has cleaned up considerably. There is no longer any foul odor and the drainage has decreased significantly. The patient is also complaining of less pain. There is still some nonviable subcutaneous tissue and fat present but overall everything is improved from the last visit. Her culture grew out a polymicrobial population of multiple drug-resistant organisms. She is currently taking Augmentin and levofloxacin as suggested by the culture data. 09/20/2022: The cavity is contracting nicely. There is no longer a connection between the posterior wound and the lateral leg wound. The posterior wound has a little bit of slough on the surface but the larger main wound is very clean. She has been approved for a wound VAC and we will apply that today. 09/25/2022: Post Wound sloughly- alginate Ag. Vac on lateral wound;  wound bed Bathing/ Shower/ Hygiene May shower with protection but do not get wound dressing(s) wet. Protect dressing(s) with water repellant cover (for example, large plastic bag) or a cast cover and may then take shower. Edema Control - Lymphedema / SCD / Other Elevate legs to the level of the heart or above for 30 minutes daily and/or when sitting for 3-4 times a day throughout the day. Avoid standing for long periods of time. Exercise regularly Wound Treatment Wound #1 - Lower Leg Wound Laterality: Left, Lateral Cleanser: Soap and Water 1 x Per Day/30 Days Discharge Instructions: May shower and wash wound with dial antibacterial soap and water prior to dressing change. Cleanser: Vashe 5.8 (oz) 1 x Per Day/30 Days Discharge Instructions: Cleanse the wound with Vashe prior to applying a clean dressing using gauze sponges, not tissue or cotton balls. Prim Dressing: Promogran Prisma Matrix, 4.34 (sq in) (silver collagen) 1 x Per Day/30 Days ary Discharge Instructions: Moisten collagen with saline and place at base of tunnel Secondary Dressing: Woven Gauze Sponge, Non-Sterile 4x4 in 1 x Per Day/30 Days Discharge Instructions: Apply over primary dressing as directed. Compression Wrap: Kerlix Roll 4.5x3.1 (in/yd) 1 x Per Day/30 Days Discharge Instructions: Apply Kerlix and Coban compression as directed. Compression Wrap: Coban Self-Adherent Wrap 4x5 (in/yd) 1 x Per Day/30 Days Discharge Instructions: Apply over Kerlix as directed. Electronic Signature(s) Signed: 11/20/2022 8:37:47 AM By: Duanne Guess MD FACS Entered By: Duanne Guess on 11/20/2022 05:03:21 -------------------------------------------------------------------------------- Problem List  Details Patient Name: Date of Service: [name] Long, [name] Long 11/20/2022 7:30 A M Medical Record Number: [id number] Patient Account Number: [id number] Date of Birth/Sex: Treating RN: 08-15-1940 (82 y.o. 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 F) Primary Care Provider: PA Zenovia Jordan, NO Other Clinician: Referring Provider: Treating Provider/Extender: Sherryl Manges in Treatment: 10 Subjective Chief Complaint Information obtained from Patient Patient seen for complaints of Non-Healing Wound. History of Present Illness (HPI) ADMISSION 09/06/2022 This is an 82 year old type II diabetic (no A1c to review and patient is unaware of her last value) with a history of atrial fibrillation on chronic anticoagulant therapy, lower extremity edema, congestive heart failure, and  stage III kidney disease. She divides her time between her home in [place]. [place] and here in [place] where her daughter lives. While in [place], she struck her leg on her bed frame and developed a large hematoma. She was evaluated in the emergency department there and imaging was negative for any deeper injury, such as a fracture. She returned to [facility] and has had an urgent care and emergency department visit to evaluate the site. As there had been no significant improvement  wound bed Bathing/ Shower/ Hygiene May shower with protection but do not get wound dressing(s) wet. Protect dressing(s) with water repellant cover (for example, large plastic bag) or a cast cover and may then take shower. Edema Control - Lymphedema / SCD / Other Elevate legs to the level of the heart or above for 30 minutes daily and/or when sitting for 3-4 times a day throughout the day. Avoid standing for long periods of time. Exercise regularly Wound Treatment Wound #1 - Lower Leg Wound Laterality: Left, Lateral Cleanser: Soap and Water 1 x Per Day/30 Days Discharge Instructions: May shower and wash wound with dial antibacterial soap and water prior to dressing change. Cleanser: Vashe 5.8 (oz) 1 x Per Day/30 Days Discharge Instructions: Cleanse the wound with Vashe prior to applying a clean dressing using gauze sponges, not tissue or cotton balls. Prim Dressing: Promogran Prisma Matrix, 4.34 (sq in) (silver collagen) 1 x Per Day/30 Days ary Discharge Instructions: Moisten collagen with saline and place at base of tunnel Secondary Dressing: Woven Gauze Sponge, Non-Sterile 4x4 in 1 x Per Day/30 Days Discharge Instructions: Apply over primary dressing as directed. Compression Wrap: Kerlix Roll 4.5x3.1 (in/yd) 1 x Per Day/30 Days Discharge Instructions: Apply Kerlix and Coban compression as directed. Compression Wrap: Coban Self-Adherent Wrap 4x5 (in/yd) 1 x Per Day/30 Days Discharge Instructions: Apply over Kerlix as directed. Electronic Signature(s) Signed: 11/20/2022 8:37:47 AM By: Duanne Guess MD FACS Entered By: Duanne Guess on 11/20/2022 05:03:21 -------------------------------------------------------------------------------- Problem List  Details Patient Name: Date of Service: [name] Long, [name] Long 11/20/2022 7:30 A M Medical Record Number: [id number] Patient Account Number: [id number] Date of Birth/Sex: Treating RN: 08-15-1940 (82 y.o. 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 F) Primary Care Provider: PA Zenovia Jordan, NO Other Clinician: Referring Provider: Treating Provider/Extender: Sherryl Manges in Treatment: 10 Subjective Chief Complaint Information obtained from Patient Patient seen for complaints of Non-Healing Wound. History of Present Illness (HPI) ADMISSION 09/06/2022 This is an 82 year old type II diabetic (no A1c to review and patient is unaware of her last value) with a history of atrial fibrillation on chronic anticoagulant therapy, lower extremity edema, congestive heart failure, and  stage III kidney disease. She divides her time between her home in [place] and here in [place] where her daughter lives. While in [place], she struck her leg on her bed frame and developed a large hematoma. She was evaluated in the emergency department there and imaging was negative for any deeper injury, such as a fracture. She returned to [facility] and has had an urgent care and emergency department visit to evaluate the site. As there had been no significant improvement

## 2022-11-23 ENCOUNTER — Ambulatory Visit (HOSPITAL_BASED_OUTPATIENT_CLINIC_OR_DEPARTMENT_OTHER): Payer: Medicare Other | Admitting: General Surgery

## 2022-11-27 ENCOUNTER — Encounter (HOSPITAL_BASED_OUTPATIENT_CLINIC_OR_DEPARTMENT_OTHER): Payer: Medicare Other | Admitting: General Surgery

## 2022-11-27 DIAGNOSIS — E11622 Type 2 diabetes mellitus with other skin ulcer: Secondary | ICD-10-CM | POA: Diagnosis not present

## 2022-11-27 NOTE — Progress Notes (Signed)
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, Congestive Heart Failure, N/A N/A Comorbid History: Hypertension, Type II Diabetes 08/05/2022 N/A N/A Date Acquired: 11 N/A N/A Weeks of Treatment: Open N/A N/A Wound Status: No N/A N/A Wound Recurrence: Yes N/A N/A Clustered Wound: 2x0.2x0.1 N/A N/A Measurements L x W x D (cm) 0.314 N/A N/A A (cm) : rea 0.031 N/A N/A Volume (cm) : 99.10% N/A N/A % Reduction in A rea: 99.90% N/A N/A % Reduction in Volume: Grade 2 N/A N/A Classification: Medium N/A N/A Exudate A mount: Sanguinous N/A N/A Exudate Type: red N/A N/A Exudate Color: Distinct, outline attached N/A N/A Wound Margin: Large (67-100%) N/A N/A Granulation A mount: Red N/A N/A Granulation Quality: None Present (0%) N/A N/A Necrotic A mount: Fat Layer (Subcutaneous Tissue): Yes N/A N/A Exposed Structures: Fascia: No Tendon: No Muscle: No Branson, [name] ([id number]) [id number] Page 5 of 9 Joint: No Bone: No Large (67-100%) N/A N/A Epithelialization: No Abnormalities Noted N/A N/A Periwound Skin Texture: No Abnormalities Noted N/A N/A Periwound Skin Moisture: Hemosiderin Staining:  Yes N/A N/A Periwound Skin Color: No Abnormality N/A N/A Temperature: Treatment Notes Electronic Signature(s) Signed: 11/27/2022 8:09:04 AM By: Duanne Guess MD FACS Entered By: Duanne Guess on 11/27/2022 05:09:04 -------------------------------------------------------------------------------- Multi-Disciplinary Long Plan Details Patient Name: Date of Service: [name] Long, Kataya 11/27/2022 7:30 A M Medical Record Number: [id number] Patient Account Number: [id number] Date of Birth/Sex: Treating RN: 1941/01/25 (82 y.o. [name] Long Renleigh Ouellet: PA Zenovia Jordan, [place] Other Clinician: Referring Schylar Wuebker: Treating Sheniqua Carolan/Extender: Sherryl Manges in Treatment: 11 Multidisciplinary Long Plan reviewed with physician Active Inactive Venous Leg Ulcer Nursing Diagnoses: Knowledge deficit related to disease process and management Potential for venous Insuffiency (use before diagnosis confirmed) Goals: Patient will maintain optimal edema control Date Initiated: 10/19/2022 Target Resolution Date: 12/18/2022 Goal Status: Active Interventions: Assess peripheral edema status every visit. Compression as ordered Treatment Activities: Therapeutic compression applied : 10/19/2022 Notes: Wound/Skin Impairment Nursing Diagnoses: Impaired tissue integrity Goals: Patient/caregiver will verbalize understanding of skin Long regimen Date Initiated: 09/06/2022 Target Resolution Date: 12/18/2022 Goal Status: Active Interventions: Assess patient/caregiver ability to obtain necessary supplies Assess patient/caregiver ability to perform ulcer/skin Long regimen upon admission and as needed Assess ulceration(s) every visit Provide education on ulcer and skin Long Screen for HBO Treatment Activities: Skin Long regimen initiated : 09/06/2022 SYMIA, DETEMPLE ([id number]) [phone].pdf Page 6 of 9 Topical wound management initiated : 09/06/2022 Notes: Electronic  Signature(s) Signed: 11/27/2022 4:32:44 PM By: [name] RN, BSN Entered By: [name] on 11/27/2022 04:51:35 -------------------------------------------------------------------------------- Pain Assessment Details Patient Name: Date of Service: MAYERLI, WEARING 11/27/2022 7:30 A M Medical Record Number: [id number] Patient Account Number: [id number] Date of Birth/Sex: Treating RN: 1940/06/21 (82 y.o. [name] Long Magie Ciampa: PA Zenovia Jordan, [place] Other Clinician: Referring Alicya Bena: Treating [name]/Extender: Sherryl Manges in Treatment: 11 Active Problems Location of Pain Severity and Description of Pain Patient Has Paino No Site Locations Rate the pain. Current Pain Level: 0 Pain Management and Medication Current Pain Management: Electronic Signature(s) Signed: 11/27/2022 4:32:44 PM By: [name] RN, BSN Entered By: [name] on 11/27/2022 04:40:33 -------------------------------------------------------------------------------- Patient/Caregiver Education Details Patient Name: Date of Service: [name] Long, [name] 9/23/2024andnbsp7:30 A M Medical Record Number: [id number] Patient Account Number: [id number] Date of Birth/Gender: Treating RN: 1940-05-29 (82 y.o. [name] Long Physician: PA Zenovia Jordan, [place] Other Clinician: Referring Physician: Treating Physician/Extender: Sherryl Manges in Treatment: [address] [place], [name] ([id number]) [id number] Page 7 of  Long, [place] ([id number]) [id number] Page 1 of 9 Visit Report for 11/27/2022 Arrival Information Details Patient Name: Date of Service: [name], Long 11/27/2022 7:30 A M Medical Record Number: [id number] Patient Account Number: [id number] Date of Birth/Sex: Treating RN: 07/10/1940 (82 y.o. [name], [name] Primary Long Saleen Peden: PA Zenovia Jordan, [place] Other Clinician: Referring Kashari Chalmers: Treating Jasmen Emrich/Extender: Sherryl Manges in Treatment: 11 Visit Information History Since Last Visit Added or deleted any medications: No Patient Arrived: Walker Any new allergies or adverse reactions: No Arrival Time: 07:34 Had a fall or experienced change in No Accompanied By: daughter activities of daily living that may affect Transfer Assistance: None risk of falls: Patient Identification Verified: Yes Signs or symptoms of abuse/neglect since last visito No Secondary Verification Process Completed: Yes Hospitalized since last visit: No Patient Requires Transmission-Based Precautions: No Implantable device outside of the clinic excluding No Patient Has Alerts: No cellular tissue based products placed in the center since last visit: Has Dressing in Place as Prescribed: Yes Has Compression in Place as Prescribed: Yes Pain Present Now: No Electronic Signature(s) Signed: 11/27/2022 4:32:44 PM By: [name] RN, BSN Entered By: [name] on 11/27/2022 04:40:01 -------------------------------------------------------------------------------- Clinic Level of Long Assessment Details Patient Name: Date of Service: KATLEN, LAGNESE 11/27/2022 7:30 A M Medical Record Number: [id number] Patient Account Number: [id number] Date of Birth/Sex: Treating RN: [birth year] (82 y.o. [name] Long Kamrie Fanton: PA Zenovia Jordan, [place] Other Clinician: Referring Bosten Newstrom: Treating Yoshie Kosel/Extender: Sherryl Manges in Treatment: 11 Clinic Level of Long Assessment  Items TOOL 4 Quantity Score []  - 0 Use when only an EandM is performed on FOLLOW-UP visit ASSESSMENTS - Nursing Assessment / Reassessment X- 1 10 Reassessment of Co-morbidities (includes updates in patient status) X- 1 5 Reassessment of Adherence to Treatment Plan ASSESSMENTS - Wound and Skin A ssessment / Reassessment X - Simple Wound Assessment / Reassessment - one wound 1 5 []  - 0 Complex Wound Assessment / Reassessment - multiple wounds []  - 0 Dermatologic / Skin Assessment (not related to wound area) ASSESSMENTS - Focused Assessment X- 1 5 Circumferential Edema Measurements - multi extremities []  - 0 Nutritional Assessment / Counseling / Intervention [place], [name] ([id number]) [id number] Page 2 of 9 X- 1 5 Lower Extremity Assessment (monofilament, tuning fork, pulses) []  - 0 Peripheral Arterial Disease Assessment (using hand held doppler) ASSESSMENTS - Ostomy and/or Continence Assessment and Long []  - 0 Incontinence Assessment and Management []  - 0 Ostomy Long Assessment and Management (repouching, etc.) PROCESS - Coordination of Long X - Simple Patient / Family Education for ongoing Long 1 15 []  - 0 Complex (extensive) Patient / Family Education for ongoing Long X- 1 10 Staff obtains Chiropractor, Records, T Results / Process Orders est []  - 0 Staff telephones HHA, Nursing Homes / Clarify orders / etc []  - 0 Routine Transfer to another Facility (non-emergent condition) []  - 0 Routine Hospital Admission (non-emergent condition) []  - 0 New Admissions / Manufacturing engineer / Ordering NPWT Apligraf, etc. 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, Congestive Heart Failure, N/A N/A Comorbid History: Hypertension, Type II Diabetes 08/05/2022 N/A N/A Date Acquired: 11 N/A N/A Weeks of Treatment: Open N/A N/A Wound Status: No N/A N/A Wound Recurrence: Yes N/A N/A Clustered Wound: 2x0.2x0.1 N/A N/A Measurements L x W x D (cm) 0.314 N/A N/A A (cm) : rea 0.031 N/A N/A Volume (cm) : 99.10% N/A N/A % Reduction in A rea: 99.90% N/A N/A % Reduction in Volume: Grade 2 N/A N/A Classification: Medium N/A N/A Exudate A mount: Sanguinous N/A N/A Exudate Type: red N/A N/A Exudate Color: Distinct, outline attached N/A N/A Wound Margin: Large (67-100%) N/A N/A Granulation A mount: Red N/A N/A Granulation Quality: None Present (0%) N/A N/A Necrotic A mount: Fat Layer (Subcutaneous Tissue): Yes N/A N/A Exposed Structures: Fascia: No Tendon: No Muscle: No Branson, [name] ([id number]) [id number] Page 5 of 9 Joint: No Bone: No Large (67-100%) N/A N/A Epithelialization: No Abnormalities Noted N/A N/A Periwound Skin Texture: No Abnormalities Noted N/A N/A Periwound Skin Moisture: Hemosiderin Staining:  Yes N/A N/A Periwound Skin Color: No Abnormality N/A N/A Temperature: Treatment Notes Electronic Signature(s) Signed: 11/27/2022 8:09:04 AM By: Duanne Guess MD FACS Entered By: Duanne Guess on 11/27/2022 05:09:04 -------------------------------------------------------------------------------- Multi-Disciplinary Long Plan Details Patient Name: Date of Service: [name] Long, Kataya 11/27/2022 7:30 A M Medical Record Number: [id number] Patient Account Number: [id number] Date of Birth/Sex: Treating RN: 1941/01/25 (82 y.o. [name] Long Renleigh Ouellet: PA Zenovia Jordan, [place] Other Clinician: Referring Schylar Wuebker: Treating Sheniqua Carolan/Extender: Sherryl Manges in Treatment: 11 Multidisciplinary Long Plan reviewed with physician Active Inactive Venous Leg Ulcer Nursing Diagnoses: Knowledge deficit related to disease process and management Potential for venous Insuffiency (use before diagnosis confirmed) Goals: Patient will maintain optimal edema control Date Initiated: 10/19/2022 Target Resolution Date: 12/18/2022 Goal Status: Active Interventions: Assess peripheral edema status every visit. Compression as ordered Treatment Activities: Therapeutic compression applied : 10/19/2022 Notes: Wound/Skin Impairment Nursing Diagnoses: Impaired tissue integrity Goals: Patient/caregiver will verbalize understanding of skin Long regimen Date Initiated: 09/06/2022 Target Resolution Date: 12/18/2022 Goal Status: Active Interventions: Assess patient/caregiver ability to obtain necessary supplies Assess patient/caregiver ability to perform ulcer/skin Long regimen upon admission and as needed Assess ulceration(s) every visit Provide education on ulcer and skin Long Screen for HBO Treatment Activities: Skin Long regimen initiated : 09/06/2022 SYMIA, DETEMPLE ([id number]) [phone].pdf Page 6 of 9 Topical wound management initiated : 09/06/2022 Notes: Electronic  Signature(s) Signed: 11/27/2022 4:32:44 PM By: [name] RN, BSN Entered By: [name] on 11/27/2022 04:51:35 -------------------------------------------------------------------------------- Pain Assessment Details Patient Name: Date of Service: MAYERLI, WEARING 11/27/2022 7:30 A M Medical Record Number: [id number] Patient Account Number: [id number] Date of Birth/Sex: Treating RN: 1940/06/21 (82 y.o. [name] Long Magie Ciampa: PA Zenovia Jordan, [place] Other Clinician: Referring Alicya Bena: Treating [name]/Extender: Sherryl Manges in Treatment: 11 Active Problems Location of Pain Severity and Description of Pain Patient Has Paino No Site Locations Rate the pain. Current Pain Level: 0 Pain Management and Medication Current Pain Management: Electronic Signature(s) Signed: 11/27/2022 4:32:44 PM By: [name] RN, BSN Entered By: [name] on 11/27/2022 04:40:33 -------------------------------------------------------------------------------- Patient/Caregiver Education Details Patient Name: Date of Service: [name] Long, [name] 9/23/2024andnbsp7:30 A M Medical Record Number: [id number] Patient Account Number: [id number] Date of Birth/Gender: Treating RN: 1940-05-29 (82 y.o. [name] Long Physician: PA Zenovia Jordan, [place] Other Clinician: Referring Physician: Treating Physician/Extender: Sherryl Manges in Treatment: [address] [place], [name] ([id number]) [id number] Page 7 of  Simple Wound Cleansing - one wound 1 5 []  - 0 Complex Wound Cleansing - multiple wounds X- 1 5 Wound Imaging (photographs - any number of wounds) []  - 0 Wound Tracing (instead of photographs) X- 1 5 Simple Wound Measurement - one wound []  - 0 Complex Wound Measurement - multiple wounds INTERVENTIONS - Wound Dressings X - Small Wound Dressing one or multiple wounds 1 10 []  - 0 Medium Wound Dressing one or multiple wounds []  - 0 Large Wound Dressing one or multiple wounds []  - 0 Application of Medications - topical []  - 0 Application of Medications - injection INTERVENTIONS - Miscellaneous []  - 0 External ear exam []  - 0 Specimen Collection (cultures, biopsies, blood, body fluids, etc.) []  - 0 Specimen(s) / Culture(s) sent or taken to Lab for analysis []  - 0 Patient Transfer (multiple staff / Nurse, adult / Similar devices) []  - 0 Simple Staple / Suture removal (25 or less) []  - 0 Complex Staple / Suture removal (26 or more) []  - 0 Hypo / Hyperglycemic Management (close monitor of Blood Glucose) Bouwman, Zulma ([id number]) [id number].pdf Page 3 of 9 []  - 0 Ankle / Brachial Index (ABI) - do not check if billed separately X- 1 5 Vital Signs Has the patient been seen at the hospital within the last three years: Yes Total Score: 95 Level Of Long: New/Established - Level 3 Electronic Signature(s) Signed: 11/27/2022 4:32:44 PM By: [name] RN, BSN Entered By: [name] on 11/27/2022 05:09:42 -------------------------------------------------------------------------------- Encounter Discharge Information Details Patient Name: Date of Service: [name], Satin 11/27/2022 7:30 A M Medical Record Number: [id number] Patient Account Number:  [id number] Date of Birth/Sex: Treating RN: Aug 31, 1940 (82 y.o. [name] Long Zahi Plaskett: PA Zenovia Jordan, [place] Other Clinician: Referring Mollie Rossano: Treating Traniyah Hallett/Extender: Sherryl Manges in Treatment: 11 Encounter Discharge Information Items Discharge Condition: Stable Ambulatory Status: Walker Discharge Destination: Home Transportation: Private Auto Accompanied By: daughter Schedule Follow-up Appointment: Yes Clinical Summary of Long: Patient Declined Electronic Signature(s) Signed: 11/27/2022 4:32:44 PM By: [name] RN, BSN Entered By: [name] on 11/27/2022 05:10:59 -------------------------------------------------------------------------------- Lower Extremity Assessment Details Patient Name: Date of Service: SYA, YOSHIOKA 11/27/2022 7:30 A M Medical Record Number: [id number] Patient Account Number: [id number] Date of Birth/Sex: Treating RN: 04/19/1940 (82 y.o. [name] Long Juana Montini: PA Zenovia Jordan, [place] Other Clinician: Referring Mariann Palo: Treating Moksha Dorgan/Extender: Sherryl Manges in Treatment: 11 Edema Assessment Assessed: [Left: No] [Right: No] Edema: [Left: Ye] [Right: s] Calf Left: Right: Point of Measurement: From Medial Instep 34 cm Ankle Left: Right: Point of Measurement: From Medial Instep 24 cm Vascular Assessment Left: [phone].pdf Page 4 of 9Right:] Pulses: Dorsalis Pedis Palpable: [phone].pdf Page 4 of 9Yes] Extremity colors, hair growth, and conditions: Extremity Color: [id number].pdf Page 4 of 9Hyperpigmented] Hair Growth on Extremity: [phone].pdf Page 4 of 9No] Temperature of Extremity: [phone].pdf Page 4 of 9Warm < 3 seconds] Electronic Signature(s) Signed: 11/27/2022 4:32:44 PM By: [name] RN, BSN Entered By: [name] on 11/27/2022  04:44:44 -------------------------------------------------------------------------------- Multi Wound Chart Details Patient Name: Date of Service: [name], Fatmata 11/27/2022 7:30 A M Medical Record Number: [id number] Patient Account Number: [id number] Date of Birth/Sex: Treating RN: 06-Jun-1940 (82 y.o. F) Primary Long Holger Sokolowski: PA Zenovia Jordan, [place] Other Clinician: Referring Zeanna Sunde: Treating Makynzie Dobesh/Extender: Sherryl Manges in Treatment: 11 Vital Signs Height(in): 64 Pulse(bpm): 52 Weight(lbs): 162 Blood Pressure(mmHg): 162/75 Body Mass Index(BMI): 27.8 Temperature(F): 98.3 Respiratory Rate(breaths/min): 18 [1:Photos:] [N/A:N/A] Left, Lateral Lower Leg N/A N/A Wound Location: Hematoma N/A N/A Wounding

## 2022-11-27 NOTE — Progress Notes (Signed)
decreased  undermining. 10/02/2022: There is a little bit of slough on the posterior wound. The lateral leg wound continues to contract at a remarkable rate. It is clean without any slough or other debris accumulation. 10/09/2022: The posterior wound is about half the size as it was last week. The lateral leg wound has contracted remarkably. There is still some undermining from 9-12 o'clock. Everything is very clean. [place], [place] ([id number]) [id number] Page 2 of 7 10/19/2022: Both wounds have contracted considerably. Once again, the posterior wound is about half the size as it was last week. The tunnel is down to about a centimeter. Everything is extremely clean. Edema control is good. 10/23/2022: The posterior wound continues to contract at a remarkable rate. She has had some bleeding from the anterior wound over the last 24 hours to the extent that it clogged the tubing for her wound VAC.. It appears to be coming from the small residual tunneled portion of the wound. No concern for infection. 10/30/2022: The posterior wound is nearly closed. The anterior wound had a little bit of oozing but not to the degree as last week. The tunnel is about the same depth while the orifice has contracted a little bit. No concern for infection. 11/07/2022: The posterior wound is healed. There is a tiny blister overlying the epithelialized surface. The anterior wound tunnel has contracted substantially. The wound surface is clean and healthy-looking. 11/13/2022: The blister over the posterior wound has resolved. The anterior wound just has about 0.4 cm of undermining. Everything is clean and healthy-looking. 11/20/2022: There is no longer any undermining and the wound is about half the size that it was last week. Healthy granulation tissue on the surface without any slough or other debris accumulation. 11/27/2022: The wound is down to 2 small open areas with a bridge of epithelialized tissue between them. The  surfaces are clean with robust granulation tissue present. Electronic Signature(s) Signed: 11/27/2022 8:09:44 AM By: Duanne Guess MD FACS Entered By: Duanne Guess on 11/27/2022 05:09:44 -------------------------------------------------------------------------------- Physical Exam Details Patient Name: Date of Service: SIDONIE, ROSANDER 11/27/2022 7:30 A M Medical Record Number: [id number] Patient Account Number: [id number] Date of Birth/Sex: Treating RN: 07/20/1940 (82 y.o. F) Primary Care Provider: PA Zenovia Jordan, NO Other Clinician: Referring Provider: Treating Provider/Extender: Sherryl Manges in Treatment: 11 Constitutional Hypertensive, asymptomatic. Bradycardic, asymptomatic. . . no acute distress. Respiratory Normal work of breathing on room air. Notes 11/27/2022: The wound is down to 2 small open areas with a bridge of epithelialized tissue between them. The surfaces are clean with robust granulation tissue present. Electronic Signature(s) Signed: 11/27/2022 8:10:20 AM By: Duanne Guess MD FACS Entered By: Duanne Guess on 11/27/2022 05:10:20 -------------------------------------------------------------------------------- Physician Orders Details Patient Name: Date of Service: ZHOEY, DRAKES 11/27/2022 7:30 A M Medical Record Number: [id number] Patient Account Number: [id number] Date of Birth/Sex: Treating RN: 23-Oct-1940 (81 y.o. [name] Provider: PA Zenovia Jordan, [place] Other Clinician: Referring Provider: Treating Provider/Extender: Sherryl Manges in Treatment: 11 Verbal / Phone Orders: No Diagnosis Coding ICD-10 Coding Code Description LEONDRIA, PAVLOFF ([id number]) [id number] Page 3 of 7 [phone] 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 Z79.01 Long term (current) use of anticoagulants I50.42 Chronic combined systolic  (congestive) and diastolic (congestive) heart failure N18.30 Chronic kidney disease, stage 3 unspecified Follow-up Appointments ppointment in 1 week. - Dr. Lady Gary - RM 1 Return A Tues 9/30 at 07:30AM Anesthetic (In  decreased  undermining. 10/02/2022: There is a little bit of slough on the posterior wound. The lateral leg wound continues to contract at a remarkable rate. It is clean without any slough or other debris accumulation. 10/09/2022: The posterior wound is about half the size as it was last week. The lateral leg wound has contracted remarkably. There is still some undermining from 9-12 o'clock. Everything is very clean. [place], [place] ([id number]) [id number] Page 2 of 7 10/19/2022: Both wounds have contracted considerably. Once again, the posterior wound is about half the size as it was last week. The tunnel is down to about a centimeter. Everything is extremely clean. Edema control is good. 10/23/2022: The posterior wound continues to contract at a remarkable rate. She has had some bleeding from the anterior wound over the last 24 hours to the extent that it clogged the tubing for her wound VAC.. It appears to be coming from the small residual tunneled portion of the wound. No concern for infection. 10/30/2022: The posterior wound is nearly closed. The anterior wound had a little bit of oozing but not to the degree as last week. The tunnel is about the same depth while the orifice has contracted a little bit. No concern for infection. 11/07/2022: The posterior wound is healed. There is a tiny blister overlying the epithelialized surface. The anterior wound tunnel has contracted substantially. The wound surface is clean and healthy-looking. 11/13/2022: The blister over the posterior wound has resolved. The anterior wound just has about 0.4 cm of undermining. Everything is clean and healthy-looking. 11/20/2022: There is no longer any undermining and the wound is about half the size that it was last week. Healthy granulation tissue on the surface without any slough or other debris accumulation. 11/27/2022: The wound is down to 2 small open areas with a bridge of epithelialized tissue between them. The  surfaces are clean with robust granulation tissue present. Electronic Signature(s) Signed: 11/27/2022 8:09:44 AM By: Duanne Guess MD FACS Entered By: Duanne Guess on 11/27/2022 05:09:44 -------------------------------------------------------------------------------- Physical Exam Details Patient Name: Date of Service: SIDONIE, ROSANDER 11/27/2022 7:30 A M Medical Record Number: [id number] Patient Account Number: [id number] Date of Birth/Sex: Treating RN: 07/20/1940 (82 y.o. F) Primary Care Provider: PA Zenovia Jordan, NO Other Clinician: Referring Provider: Treating Provider/Extender: Sherryl Manges in Treatment: 11 Constitutional Hypertensive, asymptomatic. Bradycardic, asymptomatic. . . no acute distress. Respiratory Normal work of breathing on room air. Notes 11/27/2022: The wound is down to 2 small open areas with a bridge of epithelialized tissue between them. The surfaces are clean with robust granulation tissue present. Electronic Signature(s) Signed: 11/27/2022 8:10:20 AM By: Duanne Guess MD FACS Entered By: Duanne Guess on 11/27/2022 05:10:20 -------------------------------------------------------------------------------- Physician Orders Details Patient Name: Date of Service: ZHOEY, DRAKES 11/27/2022 7:30 A M Medical Record Number: [id number] Patient Account Number: [id number] Date of Birth/Sex: Treating RN: 23-Oct-1940 (81 y.o. [name] Provider: PA Zenovia Jordan, [place] Other Clinician: Referring Provider: Treating Provider/Extender: Sherryl Manges in Treatment: 11 Verbal / Phone Orders: No Diagnosis Coding ICD-10 Coding Code Description LEONDRIA, PAVLOFF ([id number]) [id number] Page 3 of 7 [phone] 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 Z79.01 Long term (current) use of anticoagulants I50.42 Chronic combined systolic  (congestive) and diastolic (congestive) heart failure N18.30 Chronic kidney disease, stage 3 unspecified Follow-up Appointments ppointment in 1 week. - Dr. Lady Gary - RM 1 Return A Tues 9/30 at 07:30AM Anesthetic (In  [place], [place] ([id number]) [id number] Page 1 of 7 Visit Report for 11/27/2022 Chief Complaint Document Details Patient Name: Date of Service: MAIREN, BOHAN 11/27/2022 7:30 A M Medical Record Number: [id number] Patient Account Number: [id number] Date of Birth/Sex: Treating RN: September 29, 1940 (82 y.o. F) Primary Care Provider: PA Zenovia Jordan, NO Other Clinician: Referring Provider: Treating Provider/Extender: Sherryl Manges in Treatment: 11 Information Obtained from: Patient Chief Complaint Patient seen for complaints of Non-Healing Wound. Electronic Signature(s) Signed: 11/27/2022 8:09:10 AM By: Duanne Guess MD FACS Entered By: Duanne Guess on 11/27/2022 05:09:10 -------------------------------------------------------------------------------- HPI Details Patient Name: Date of Service: [name], Penni 11/27/2022 7:30 A M Medical Record Number: [id number] Patient Account Number: [id number] Date of Birth/Sex: Treating RN: June 05, 1940 (82 y.o. F) Primary Care Provider: PA TIENT, NO Other Clinician: Referring Provider: Treating Provider/Extender: Sherryl Manges in Treatment: 11 History of Present Illness HPI Description: ADMISSION 09/06/2022 This is an 82 year old type II diabetic (no A1c to review and patient is unaware of her last value) with a history of atrial fibrillation on chronic anticoagulant therapy, lower extremity edema, congestive heart failure, and stage III kidney disease. She divides her time between her home in [place]. [place] and here in [place] where her daughter lives. While in [place], she struck her leg on her bed frame and developed a large hematoma. She was evaluated in the emergency department there and imaging was negative for any deeper injury, such as a fracture. She returned to [facility] and has had an urgent care and emergency department visit to evaluate the site. As there had been no significant improvement in her wound, she  was referred to the wound care center for further evaluation and management. I am unable to see any evidence of an attempt to evacuate the hematoma in the electronic medical record.. There are 2 openings in her skin, but they connect to each other with significant tunneling and undermining. I am unsure if these opened spontaneously or there was a non- documented attempt to drain the hematoma site. The majority of her left lower leg from below the tibial tuberosity to just above the ankle and from the anterior tibia wrapping laterally and posteriorly to nearly the medial aspect of her leg is involved. There is a foul odor coming from her wound. There is no frank pus but there is significant retained clot that has likely served as culture media. ABI in clinic today was 0.84. 09/08/2022: The patient came in today as an add-on due to bloody drainage that was saturating her dressing. On inspection, it appeared that there was no frank fresh blood and no active site of bleeding identified; it appears that more of the hematoma was liquefied and draining from her wound. 09/13/2022: The wound has cleaned up considerably. There is no longer any foul odor and the drainage has decreased significantly. The patient is also complaining of less pain. There is still some nonviable subcutaneous tissue and fat present but overall everything is improved from the last visit. Her culture grew out a polymicrobial population of multiple drug-resistant organisms. She is currently taking Augmentin and levofloxacin as suggested by the culture data. 09/20/2022: The cavity is contracting nicely. There is no longer a connection between the posterior wound and the lateral leg wound. The posterior wound has a little bit of slough on the surface but the larger main wound is very clean. She has been approved for a wound VAC and we will apply that today. 09/25/2022: Post Wound sloughly- alginate Ag. Vac on lateral wound;  decreased  undermining. 10/02/2022: There is a little bit of slough on the posterior wound. The lateral leg wound continues to contract at a remarkable rate. It is clean without any slough or other debris accumulation. 10/09/2022: The posterior wound is about half the size as it was last week. The lateral leg wound has contracted remarkably. There is still some undermining from 9-12 o'clock. Everything is very clean. [place], [place] ([id number]) [id number] Page 2 of 7 10/19/2022: Both wounds have contracted considerably. Once again, the posterior wound is about half the size as it was last week. The tunnel is down to about a centimeter. Everything is extremely clean. Edema control is good. 10/23/2022: The posterior wound continues to contract at a remarkable rate. She has had some bleeding from the anterior wound over the last 24 hours to the extent that it clogged the tubing for her wound VAC.. It appears to be coming from the small residual tunneled portion of the wound. No concern for infection. 10/30/2022: The posterior wound is nearly closed. The anterior wound had a little bit of oozing but not to the degree as last week. The tunnel is about the same depth while the orifice has contracted a little bit. No concern for infection. 11/07/2022: The posterior wound is healed. There is a tiny blister overlying the epithelialized surface. The anterior wound tunnel has contracted substantially. The wound surface is clean and healthy-looking. 11/13/2022: The blister over the posterior wound has resolved. The anterior wound just has about 0.4 cm of undermining. Everything is clean and healthy-looking. 11/20/2022: There is no longer any undermining and the wound is about half the size that it was last week. Healthy granulation tissue on the surface without any slough or other debris accumulation. 11/27/2022: The wound is down to 2 small open areas with a bridge of epithelialized tissue between them. The  surfaces are clean with robust granulation tissue present. Electronic Signature(s) Signed: 11/27/2022 8:09:44 AM By: Duanne Guess MD FACS Entered By: Duanne Guess on 11/27/2022 05:09:44 -------------------------------------------------------------------------------- Physical Exam Details Patient Name: Date of Service: SIDONIE, ROSANDER 11/27/2022 7:30 A M Medical Record Number: [id number] Patient Account Number: [id number] Date of Birth/Sex: Treating RN: 07/20/1940 (82 y.o. F) Primary Care Provider: PA Zenovia Jordan, NO Other Clinician: Referring Provider: Treating Provider/Extender: Sherryl Manges in Treatment: 11 Constitutional Hypertensive, asymptomatic. Bradycardic, asymptomatic. . . no acute distress. Respiratory Normal work of breathing on room air. Notes 11/27/2022: The wound is down to 2 small open areas with a bridge of epithelialized tissue between them. The surfaces are clean with robust granulation tissue present. Electronic Signature(s) Signed: 11/27/2022 8:10:20 AM By: Duanne Guess MD FACS Entered By: Duanne Guess on 11/27/2022 05:10:20 -------------------------------------------------------------------------------- Physician Orders Details Patient Name: Date of Service: ZHOEY, DRAKES 11/27/2022 7:30 A M Medical Record Number: [id number] Patient Account Number: [id number] Date of Birth/Sex: Treating RN: 23-Oct-1940 (81 y.o. [name] Provider: PA Zenovia Jordan, [place] Other Clinician: Referring Provider: Treating Provider/Extender: Sherryl Manges in Treatment: 11 Verbal / Phone Orders: No Diagnosis Coding ICD-10 Coding Code Description LEONDRIA, PAVLOFF ([id number]) [id number] Page 3 of 7 [phone] 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 Z79.01 Long term (current) use of anticoagulants I50.42 Chronic combined systolic  (congestive) and diastolic (congestive) heart failure N18.30 Chronic kidney disease, stage 3 unspecified Follow-up Appointments ppointment in 1 week. - Dr. Lady Gary - RM 1 Return A Tues 9/30 at 07:30AM Anesthetic (In  [place], [place] ([id number]) [id number] Page 1 of 7 Visit Report for 11/27/2022 Chief Complaint Document Details Patient Name: Date of Service: MAIREN, BOHAN 11/27/2022 7:30 A M Medical Record Number: [id number] Patient Account Number: [id number] Date of Birth/Sex: Treating RN: September 29, 1940 (82 y.o. F) Primary Care Provider: PA Zenovia Jordan, NO Other Clinician: Referring Provider: Treating Provider/Extender: Sherryl Manges in Treatment: 11 Information Obtained from: Patient Chief Complaint Patient seen for complaints of Non-Healing Wound. Electronic Signature(s) Signed: 11/27/2022 8:09:10 AM By: Duanne Guess MD FACS Entered By: Duanne Guess on 11/27/2022 05:09:10 -------------------------------------------------------------------------------- HPI Details Patient Name: Date of Service: [name], Penni 11/27/2022 7:30 A M Medical Record Number: [id number] Patient Account Number: [id number] Date of Birth/Sex: Treating RN: June 05, 1940 (82 y.o. F) Primary Care Provider: PA TIENT, NO Other Clinician: Referring Provider: Treating Provider/Extender: Sherryl Manges in Treatment: 11 History of Present Illness HPI Description: ADMISSION 09/06/2022 This is an 82 year old type II diabetic (no A1c to review and patient is unaware of her last value) with a history of atrial fibrillation on chronic anticoagulant therapy, lower extremity edema, congestive heart failure, and stage III kidney disease. She divides her time between her home in [place]. [place] and here in [place] where her daughter lives. While in [place], she struck her leg on her bed frame and developed a large hematoma. She was evaluated in the emergency department there and imaging was negative for any deeper injury, such as a fracture. She returned to [facility] and has had an urgent care and emergency department visit to evaluate the site. As there had been no significant improvement in her wound, she  was referred to the wound care center for further evaluation and management. I am unable to see any evidence of an attempt to evacuate the hematoma in the electronic medical record.. There are 2 openings in her skin, but they connect to each other with significant tunneling and undermining. I am unsure if these opened spontaneously or there was a non- documented attempt to drain the hematoma site. The majority of her left lower leg from below the tibial tuberosity to just above the ankle and from the anterior tibia wrapping laterally and posteriorly to nearly the medial aspect of her leg is involved. There is a foul odor coming from her wound. There is no frank pus but there is significant retained clot that has likely served as culture media. ABI in clinic today was 0.84. 09/08/2022: The patient came in today as an add-on due to bloody drainage that was saturating her dressing. On inspection, it appeared that there was no frank fresh blood and no active site of bleeding identified; it appears that more of the hematoma was liquefied and draining from her wound. 09/13/2022: The wound has cleaned up considerably. There is no longer any foul odor and the drainage has decreased significantly. The patient is also complaining of less pain. There is still some nonviable subcutaneous tissue and fat present but overall everything is improved from the last visit. Her culture grew out a polymicrobial population of multiple drug-resistant organisms. She is currently taking Augmentin and levofloxacin as suggested by the culture data. 09/20/2022: The cavity is contracting nicely. There is no longer a connection between the posterior wound and the lateral leg wound. The posterior wound has a little bit of slough on the surface but the larger main wound is very clean. She has been approved for a wound VAC and we will apply that today. 09/25/2022: Post Wound sloughly- alginate Ag. Vac on lateral wound;  decreased  undermining. 10/02/2022: There is a little bit of slough on the posterior wound. The lateral leg wound continues to contract at a remarkable rate. It is clean without any slough or other debris accumulation. 10/09/2022: The posterior wound is about half the size as it was last week. The lateral leg wound has contracted remarkably. There is still some undermining from 9-12 o'clock. Everything is very clean. [place], [place] ([id number]) [id number] Page 2 of 7 10/19/2022: Both wounds have contracted considerably. Once again, the posterior wound is about half the size as it was last week. The tunnel is down to about a centimeter. Everything is extremely clean. Edema control is good. 10/23/2022: The posterior wound continues to contract at a remarkable rate. She has had some bleeding from the anterior wound over the last 24 hours to the extent that it clogged the tubing for her wound VAC.. It appears to be coming from the small residual tunneled portion of the wound. No concern for infection. 10/30/2022: The posterior wound is nearly closed. The anterior wound had a little bit of oozing but not to the degree as last week. The tunnel is about the same depth while the orifice has contracted a little bit. No concern for infection. 11/07/2022: The posterior wound is healed. There is a tiny blister overlying the epithelialized surface. The anterior wound tunnel has contracted substantially. The wound surface is clean and healthy-looking. 11/13/2022: The blister over the posterior wound has resolved. The anterior wound just has about 0.4 cm of undermining. Everything is clean and healthy-looking. 11/20/2022: There is no longer any undermining and the wound is about half the size that it was last week. Healthy granulation tissue on the surface without any slough or other debris accumulation. 11/27/2022: The wound is down to 2 small open areas with a bridge of epithelialized tissue between them. The  surfaces are clean with robust granulation tissue present. Electronic Signature(s) Signed: 11/27/2022 8:09:44 AM By: Duanne Guess MD FACS Entered By: Duanne Guess on 11/27/2022 05:09:44 -------------------------------------------------------------------------------- Physical Exam Details Patient Name: Date of Service: SIDONIE, ROSANDER 11/27/2022 7:30 A M Medical Record Number: [id number] Patient Account Number: [id number] Date of Birth/Sex: Treating RN: 07/20/1940 (82 y.o. F) Primary Care Provider: PA Zenovia Jordan, NO Other Clinician: Referring Provider: Treating Provider/Extender: Sherryl Manges in Treatment: 11 Constitutional Hypertensive, asymptomatic. Bradycardic, asymptomatic. . . no acute distress. Respiratory Normal work of breathing on room air. Notes 11/27/2022: The wound is down to 2 small open areas with a bridge of epithelialized tissue between them. The surfaces are clean with robust granulation tissue present. Electronic Signature(s) Signed: 11/27/2022 8:10:20 AM By: Duanne Guess MD FACS Entered By: Duanne Guess on 11/27/2022 05:10:20 -------------------------------------------------------------------------------- Physician Orders Details Patient Name: Date of Service: ZHOEY, DRAKES 11/27/2022 7:30 A M Medical Record Number: [id number] Patient Account Number: [id number] Date of Birth/Sex: Treating RN: 23-Oct-1940 (81 y.o. [name] Provider: PA Zenovia Jordan, [place] Other Clinician: Referring Provider: Treating Provider/Extender: Sherryl Manges in Treatment: 11 Verbal / Phone Orders: No Diagnosis Coding ICD-10 Coding Code Description LEONDRIA, PAVLOFF ([id number]) [id number] Page 3 of 7 [phone] 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 Z79.01 Long term (current) use of anticoagulants I50.42 Chronic combined systolic  (congestive) and diastolic (congestive) heart failure N18.30 Chronic kidney disease, stage 3 unspecified Follow-up Appointments ppointment in 1 week. - Dr. Lady Gary - RM 1 Return A Tues 9/30 at 07:30AM Anesthetic (In

## 2022-11-30 ENCOUNTER — Ambulatory Visit (HOSPITAL_BASED_OUTPATIENT_CLINIC_OR_DEPARTMENT_OTHER): Payer: Medicare Other | Admitting: General Surgery

## 2022-12-04 ENCOUNTER — Encounter (HOSPITAL_BASED_OUTPATIENT_CLINIC_OR_DEPARTMENT_OTHER): Payer: Medicare Other | Admitting: General Surgery

## 2022-12-04 DIAGNOSIS — E11622 Type 2 diabetes mellitus with other skin ulcer: Secondary | ICD-10-CM | POA: Diagnosis not present

## 2022-12-04 NOTE — Progress Notes (Signed)
Measurements L x W x D (cm) 0 N/A N/A A (cm) : rea 0 N/A N/A Volume (cm) : 100.00% N/A N/A % Reduction in A rea: 100.00% N/A N/A % Reduction in Volume: Grade 2 N/A N/A Classification: Small N/A N/A Exudate A mount: Serous N/A N/A Exudate Type: amber N/A N/A Exudate Color: None Present (0%) N/A N/A Granulation A mount: None Present (0%) N/A N/A Necrotic A mount: Fat Layer (Subcutaneous Tissue): Yes N/A N/A Exposed Structures: Fascia: No Tendon: No Muscle: No Joint: No Bone: No Large (67-100%) N/A N/A Epithelialization: Debridement - Selective/Open Wound N/A N/A Debridement: Pre-procedure Verification/Time Out 08:05 N/A N/A Taken: Lidocaine 4% Topical Solution N/A N/A Pain Control: Necrotic/Eschar N/A N/A Tissue Debrided: Non-Viable Tissue N/A N/A Level: 0.09 N/A N/A Debridement A (sq cm): rea Curette N/A N/A Instrument: Minimum N/A N/A Bleeding: Pressure N/A N/A Hemostasis A chieved: 0 N/A N/A Procedural Pain: 0 N/A N/A Post Procedural Pain: Procedure was tolerated well N/A N/A Debridement Treatment Response: 0.3x0.2x0.1 N/A N/A Post Debridement Measurements L x W x D (cm) 0.005 N/A N/A Post Debridement Volume:  (cm) No Abnormalities Noted N/A N/A Periwound Skin Texture: No Abnormalities Noted N/A N/A Periwound Skin Moisture: Hemosiderin Staining: Yes N/A N/A Periwound Skin Color: No Abnormality N/A N/A Temperature: Debridement N/A N/A Procedures Performed: Treatment Notes Electronic Signature(s) Signed: 12/04/2022 8:18:05 AM By: Duanne Guess MD FACS Entered By: Duanne Guess on 12/04/2022 08:18:05 -------------------------------------------------------------------------------- Multi-Disciplinary Care Plan Details Patient Name: Date of Service: [name] Long, [name] Long 12/04/2022 7:30 A M Medical Record Number: [id number] Patient Account Number: [id number] Date of Birth/Sex: Treating RN: Jul 16, 1940 (82 y.o. [name] [name]: PA Zenovia Jordan, [place] Other Clinician: Referring Chaitra Mast: Treating Tc Kapusta/Extender: Sherryl Manges in Treatment: [address] [place] ([id number]) [id number] Page 4 of 7 Multidisciplinary Care Plan reviewed with physician Active Inactive Venous Leg Ulcer Nursing Diagnoses: Knowledge deficit related to disease process and management Potential for venous Insuffiency (use before diagnosis confirmed) Goals: Patient will maintain optimal edema control Date Initiated: 10/19/2022 Target Resolution Date: 12/18/2022 Goal Status: Active Interventions: Assess peripheral edema status every visit. Compression as ordered Treatment Activities: Therapeutic compression applied : 10/19/2022 Notes: Wound/Skin Impairment Nursing Diagnoses: Impaired tissue integrity Goals: Patient/caregiver will verbalize understanding of skin care regimen Date Initiated: 09/06/2022 Target Resolution Date: 12/18/2022 Goal Status: Active Interventions: Assess patient/caregiver ability to obtain necessary supplies Assess patient/caregiver ability to perform ulcer/skin care regimen upon admission and as needed Assess ulceration(s) every visit Provide  education on ulcer and skin care Screen for HBO Treatment Activities: Skin care regimen initiated : 09/06/2022 Topical wound management initiated : 09/06/2022 Notes: Electronic Signature(s) Signed: 12/04/2022 4:00:06 PM By: [name] RN, BSN Entered By: [name] on 12/04/2022 07:54:28 -------------------------------------------------------------------------------- Pain Assessment Details Patient Name: Date of Service: [name] Long, [name] Long 12/04/2022 7:30 A M Medical Record Number: [id number] Patient Account Number: [id number] Date of Birth/Sex: Treating RN: 1940-11-01 (82 y.o. [name] Roxan Yamamoto: PA Zenovia Jordan, [place] Other Clinician: Referring Aylen Stradford: Treating Dalten Ambrosino/Extender: Sherryl Manges in Treatment: 12 Active Problems Location of Pain Severity and Description of Pain Patient Has Paino No Site Locations Rate the pain. [place], [place] ([id number]) [id number] Page 5 of 7 Rate the pain. Current Pain Level: 0 Pain Management and Medication Current Pain Management: Electronic Signature(s) Signed: 12/04/2022 4:00:06 PM By: [name] RN, BSN Entered By: [name] on 12/04/2022 07:46:38 -------------------------------------------------------------------------------- Patient/Caregiver Education Details Patient Name: Date of Service: [name] Long, [name] 9/30/2024andnbsp7:30 A M Medical Record Number: [id number] Patient Account Number: [id number]  Date of Birth/Gender: Treating RN: December 09, 1940 (82 y.o. [name] Physician: PA Zenovia Jordan, [place] Other Clinician: Referring Physician: Treating Physician/Extender: Sherryl Manges in Treatment: 12 Education Assessment Education Provided To: Patient Education Topics Provided Venous: Methods: Explain/Verbal Responses: Reinforcements needed, State content correctly Electronic Signature(s) Signed: 12/04/2022 4:00:06 PM By: [name] RN, BSN Entered By:  [name] on 12/04/2022 07:55:21 -------------------------------------------------------------------------------- Wound Assessment Details Patient Name: Date of Service: [name] Long, [name] Long 12/04/2022 7:30 A M Medical Record Number: [id number] Patient Account Number: [id number] Date of Birth/Sex: Treating RN: Jul 08, 1940 (82 y.o. [name] [name]: PA Zenovia Jordan, [place] Other Clinician: Referring [name]: Treating [name], [name] [id number] Page 6 of 7 Weeks in Treatment: 12 Wound Status Wound Number: 1 Primary Diabetic Wound/Ulcer of the Lower Extremity Etiology: Wound Location: Left, Lateral Lower Leg Wound Status: Open Wounding Event: Hematoma Comorbid Arrhythmia, Congestive Heart Failure, Hypertension, Type II Date Acquired: 08/05/2022 History: Diabetes Weeks Of Treatment: 12 Clustered Wound: Yes Photos Wound Measurements Length: (cm) Width: (cm) Depth: (cm) Area: (cm) Volume: (cm) 0 % Reduction in Area: 100% 0 % Reduction in Volume: 100% 0 Epithelialization: Large (67-100%) 0 Tunneling: No 0 Undermining: No Wound Description Classification: Grade 2 Exudate Amount: Small Exudate Type: Serous Exudate Color: amber Foul Odor After Cleansing: No Slough/Fibrino No Wound Bed Granulation Amount: None Present (0%) Exposed Structure Necrotic Amount: None Present (0%) Fascia Exposed: No Fat Layer (Subcutaneous Tissue) Exposed: Yes Tendon Exposed: No Muscle Exposed: No Joint Exposed: No Bone Exposed: No Periwound Skin Texture Texture Color No Abnormalities Noted: Yes No Abnormalities Noted: No Hemosiderin Staining: Yes Moisture No Abnormalities Noted: Yes Temperature / Pain Temperature: No Abnormality Treatment Notes Wound #1 (Lower Leg) Wound Laterality: Left, Lateral Cleanser Soap and Water Discharge Instruction: May shower and wash wound with dial antibacterial soap and water  prior to dressing change. Vashe 5.8 (oz) Discharge Instruction: Cleanse the wound with Vashe prior to applying a clean dressing using gauze sponges, not tissue or cotton balls. Peri-Wound Care Topical Primary Dressing Maxorb Extra Ag+ Alginate Dressing, 2x2 (in/in) Discharge Instruction: Apply to wound bed as instructed Secondary Dressing Woven Gauze Sponge, Non-Sterile 4x4 in [place], [place] ([id number]) [phone].pdf Page 7 of 7 Discharge Instruction: Apply over primary dressing as directed. Secured With Compression Wrap Kerlix Roll 4.5x3.1 (in/yd) Discharge Instruction: Apply Kerlix and Coban compression as directed. Coban Self-Adherent Wrap 4x5 (in/yd) Discharge Instruction: Apply over Kerlix as directed. Compression Stockings Add-Ons Electronic Signature(s) Signed: 12/04/2022 4:00:06 PM By: [name] RN, BSN Entered By: [name] on 12/04/2022 07:54:01 -------------------------------------------------------------------------------- Vitals Details Patient Name: Date of Service: [name] Long, [name] Long 12/04/2022 7:30 A M Medical Record Number: [id number] Patient Account Number: [id number] Date of Birth/Sex: Treating RN: 1940-06-01 (82 y.o. [name] Winson Eichorn: PA Zenovia [place], [place] Other Clinician: Referring Sricharan Lacomb: Treating Avarae Zwart/Extender: Sherryl Manges in Treatment: 12 Vital Signs Time Taken: 07:45 Temperature (F): 97.9 Height (in): 64 Pulse (bpm): 55 Weight (lbs): 162 Respiratory Rate (breaths/min): 18 Body Mass Index (BMI): 27.8 Blood Pressure (mmHg): 158/87 Reference Range: 80 - 120 mg / dl Electronic Signature(s) Signed: 12/04/2022 4:00:06 PM By: [name] RN, BSN Entered By: [name] on 12/04/2022 07:46:02  Date of Birth/Gender: Treating RN: December 09, 1940 (82 y.o. [name] Physician: PA Zenovia Jordan, [place] Other Clinician: Referring Physician: Treating Physician/Extender: Sherryl Manges in Treatment: 12 Education Assessment Education Provided To: Patient Education Topics Provided Venous: Methods: Explain/Verbal Responses: Reinforcements needed, State content correctly Electronic Signature(s) Signed: 12/04/2022 4:00:06 PM By: [name] RN, BSN Entered By:  [name] on 12/04/2022 07:55:21 -------------------------------------------------------------------------------- Wound Assessment Details Patient Name: Date of Service: [name] Long, [name] Long 12/04/2022 7:30 A M Medical Record Number: [id number] Patient Account Number: [id number] Date of Birth/Sex: Treating RN: Jul 08, 1940 (82 y.o. [name] [name]: PA Zenovia Jordan, [place] Other Clinician: Referring [name]: Treating [name], [name] [id number] Page 6 of 7 Weeks in Treatment: 12 Wound Status Wound Number: 1 Primary Diabetic Wound/Ulcer of the Lower Extremity Etiology: Wound Location: Left, Lateral Lower Leg Wound Status: Open Wounding Event: Hematoma Comorbid Arrhythmia, Congestive Heart Failure, Hypertension, Type II Date Acquired: 08/05/2022 History: Diabetes Weeks Of Treatment: 12 Clustered Wound: Yes Photos Wound Measurements Length: (cm) Width: (cm) Depth: (cm) Area: (cm) Volume: (cm) 0 % Reduction in Area: 100% 0 % Reduction in Volume: 100% 0 Epithelialization: Large (67-100%) 0 Tunneling: No 0 Undermining: No Wound Description Classification: Grade 2 Exudate Amount: Small Exudate Type: Serous Exudate Color: amber Foul Odor After Cleansing: No Slough/Fibrino No Wound Bed Granulation Amount: None Present (0%) Exposed Structure Necrotic Amount: None Present (0%) Fascia Exposed: No Fat Layer (Subcutaneous Tissue) Exposed: Yes Tendon Exposed: No Muscle Exposed: No Joint Exposed: No Bone Exposed: No Periwound Skin Texture Texture Color No Abnormalities Noted: Yes No Abnormalities Noted: No Hemosiderin Staining: Yes Moisture No Abnormalities Noted: Yes Temperature / Pain Temperature: No Abnormality Treatment Notes Wound #1 (Lower Leg) Wound Laterality: Left, Lateral Cleanser Soap and Water Discharge Instruction: May shower and wash wound with dial antibacterial soap and water  prior to dressing change. Vashe 5.8 (oz) Discharge Instruction: Cleanse the wound with Vashe prior to applying a clean dressing using gauze sponges, not tissue or cotton balls. Peri-Wound Care Topical Primary Dressing Maxorb Extra Ag+ Alginate Dressing, 2x2 (in/in) Discharge Instruction: Apply to wound bed as instructed Secondary Dressing Woven Gauze Sponge, Non-Sterile 4x4 in [place], [place] ([id number]) [phone].pdf Page 7 of 7 Discharge Instruction: Apply over primary dressing as directed. Secured With Compression Wrap Kerlix Roll 4.5x3.1 (in/yd) Discharge Instruction: Apply Kerlix and Coban compression as directed. Coban Self-Adherent Wrap 4x5 (in/yd) Discharge Instruction: Apply over Kerlix as directed. Compression Stockings Add-Ons Electronic Signature(s) Signed: 12/04/2022 4:00:06 PM By: [name] RN, BSN Entered By: [name] on 12/04/2022 07:54:01 -------------------------------------------------------------------------------- Vitals Details Patient Name: Date of Service: [name] Long, [name] Long 12/04/2022 7:30 A M Medical Record Number: [id number] Patient Account Number: [id number] Date of Birth/Sex: Treating RN: 1940-06-01 (82 y.o. [name] Winson Eichorn: PA Zenovia Jordan, [place] Other Clinician: Referring Sricharan Lacomb: Treating Avarae Zwart/Extender: Sherryl Manges in Treatment: 12 Vital Signs Time Taken: 07:45 Temperature (F): 97.9 Height (in): 64 Pulse (bpm): 55 Weight (lbs): 162 Respiratory Rate (breaths/min): 18 Body Mass Index (BMI): 27.8 Blood Pressure (mmHg): 158/87 Reference Range: 80 - 120 mg / dl Electronic Signature(s) Signed: 12/04/2022 4:00:06 PM By: [name] RN, BSN Entered By: [name] on 12/04/2022 07:46:02

## 2022-12-04 NOTE — Progress Notes (Signed)
noted as No Abnormality. 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 There was a Selective/Open Wound Non-Viable Tissue Debridement with a total area of 0.09 sq cm performed by Duanne Guess, MD. With the following instrument(s): Curette to remove Non-Viable tissue/material. Material removed includes Eschar after achieving pain control using Lidocaine 4% T opical Solution. No specimens were taken. A time out was conducted at 08:05, prior to the start of the procedure. A Minimum amount of bleeding was controlled with Pressure. The procedure was tolerated well with a pain level of 0 throughout and a pain level of 0 following the procedure. Post Debridement Measurements: 0.3cm length x 0.2cm width x 0.1cm depth; 0.005cm^3  volume. Character of Wound/Ulcer Post Debridement is improved. Severity of Tissue Post Debridement is: Fat layer exposed. Post procedure Diagnosis Wound #1: Same as Pre-Procedure DELBERTA, FOLTS ([id number]) [id number].pdf Page 7 of 9 Plan Follow-up Appointments: Return Appointment in 1 week. - Dr. Lady Gary - RM 1 Mon 10/7 at 07:30AM Anesthetic: (In clinic) Topical Lidocaine 4% applied to wound bed Bathing/ Shower/ Hygiene: May shower with protection but do not get wound dressing(s) wet. Protect dressing(s) with water repellant cover (for example, large plastic bag) or a cast cover and may then take shower. Edema Control - Lymphedema / SCD / Other: Elevate legs to the level of the heart or above for 30 minutes daily and/or when sitting for 3-4 times a day throughout the day. Avoid standing for long periods of time. Exercise regularly WOUND #1: - Lower Leg Wound Laterality: Left, Lateral Cleanser: Soap and Water 1 x Per Day/30 Days Discharge Instructions: May shower and wash wound with dial antibacterial soap and water prior to dressing change. Cleanser: Vashe 5.8 (oz) 1 x Per Day/30 Days Discharge Instructions: Cleanse the wound with Vashe prior to applying a clean dressing using gauze sponges, not tissue or cotton balls. Prim Dressing: Maxorb Extra Ag+ Alginate Dressing, 2x2 (in/in) 1 x Per Day/30 Days ary Discharge Instructions: Apply to wound bed as instructed Secondary Dressing: Woven Gauze Sponge, Non-Sterile 4x4 in 1 x Per Day/30 Days Discharge Instructions: Apply over primary dressing as directed. Com pression Wrap: Kerlix Roll 4.5x3.1 (in/yd) 1 x Per Day/30 Days Discharge Instructions: Apply Kerlix and Coban compression as directed. Com pression Wrap: Coban Self-Adherent Wrap 4x5 (in/yd) 1 x Per Day/30 Days Discharge Instructions: Apply over Kerlix as directed. 12/04/2022: The wound is eschared over. Underneath the eschar, there is a tiny residual opening.  Edema control is good. I used a curette to debride the eschar from the wound. We will continue silver alginate with Kerlix and Coban wrap. I anticipate she will be completely healed at her visit next week. Electronic Signature(s) Signed: 12/04/2022 8:21:20 AM By: Duanne Guess MD FACS Entered By: Duanne Guess on 12/04/2022 08:21:20 -------------------------------------------------------------------------------- HxROS Details Patient Name: Date of Service: [name], Fanchon 12/04/2022 7:30 A M Medical Record Number: [id number] Patient Account Number: [id number] Date of Birth/Sex: Treating RN: [birth year] (82 y.o. F) Primary Care Provider: PA Zenovia Jordan, NO Other Clinician: Referring Provider: Treating Provider/Extender: Sherryl Manges in Treatment: 12 Information Obtained From Patient Cardiovascular  [place], [place] ([id number]) [id number] Page 1 of 9 Visit Report for 12/04/2022 Chief Complaint Document Details Patient Name: Date of Service: KARSTYN, BIRKEY 12/04/2022 7:30 A M Medical Record Number: [id number] Patient Account Number: [id number] Date of Birth/Sex: Treating RN: [birth year] (82 y.o. F) Primary Care Provider: PA Zenovia Jordan, NO Other Clinician: Referring Provider: Treating Provider/Extender: Sherryl Manges in Treatment: 12 Information Obtained from: Patient Chief Complaint Patient seen for complaints of Non-Healing Wound. Electronic Signature(s) Signed: 12/04/2022 8:18:12 AM By: Duanne Guess MD FACS Entered By: Duanne Guess on 12/04/2022 08:18:11 -------------------------------------------------------------------------------- Debridement Details Patient Name: Date of Service: [name], Nicey 12/04/2022 7:30 A M Medical Record Number: [id number] Patient Account Number: [id number] Date of Birth/Sex: Treating RN: [birth year] (82 y.o. [name] Provider: PA Zenovia Jordan, [place] Other Clinician: Referring Provider: Treating Provider/Extender: Sherryl Manges in Treatment: 12 Debridement Performed for Assessment: Wound #1 Left,Lateral Lower Leg Performed By: Physician Duanne Guess, MD The following information was scribed by: [name] The information was scribed for: Duanne Guess Debridement Type: Debridement Severity of Tissue Pre Debridement: Fat layer exposed Level of Consciousness (Pre-procedure): Awake and Alert Pre-procedure Verification/Time Out Yes - 08:05 Taken: Start Time: 08:05 Pain Control: Lidocaine 4% Topical Solution Percent of Wound Bed Debrided: 200% T Area Debrided (cm): otal 0.09 Tissue and other material debrided: Non-Viable, Eschar Level: Non-Viable Tissue Debridement Description: Selective/Open Wound Instrument: Curette Bleeding: Minimum Hemostasis Achieved: Pressure Procedural  Pain: 0 Post Procedural Pain: 0 Response to Treatment: Procedure was tolerated well Level of Consciousness (Post- Awake and Alert procedure): Post Debridement Measurements of Total Wound Length: (cm) 0.3 Width: (cm) 0.2 Depth: (cm) 0.1 Volume: (cm) 0.005 [place], [place] ([id number]) [id number] Page 2 of 9 Character of Wound/Ulcer Post Debridement: Improved Severity of Tissue Post Debridement: Fat layer exposed Post Procedure Diagnosis Same as Pre-procedure Electronic Signature(s) Signed: 12/04/2022 9:44:25 AM By: Duanne Guess MD FACS Signed: 12/04/2022 4:00:06 PM By: [name] RN, BSN Entered By: [name] on 12/04/2022 08:08:56 -------------------------------------------------------------------------------- HPI Details Patient Name: Date of Service: [name], Jamilee 12/04/2022 7:30 A M Medical Record Number: [id number] Patient Account Number: [id number] Date of Birth/Sex: Treating RN: [birth year] (82 y.o. F) Primary Care Provider: PA TIENT, NO Other Clinician: Referring Provider: Treating Provider/Extender: Sherryl Manges in Treatment: 12 History of Present Illness HPI Description: ADMISSION 09/06/2022 This is an 82 year old type II diabetic (no A1c to review and patient is unaware of her last value) with a history of atrial fibrillation on chronic anticoagulant therapy, lower extremity edema, congestive heart failure, and stage III kidney disease. She divides her time between her home in [place]. [place] and here in [place] where her daughter lives. While in [place], she struck her leg on her bed frame and developed a large hematoma. She was evaluated in the emergency department there and imaging was negative for any deeper injury, such as a fracture. She returned to [facility] and has had an urgent care and emergency department visit to evaluate the site. As there had been no significant improvement in her wound, she was referred to the  wound care center for further evaluation and management. I am unable to see any evidence of an attempt to evacuate the hematoma in the electronic medical record.. There are 2 openings in her skin, but they connect to each other with significant tunneling and undermining. I am unsure if these opened spontaneously or there was a non- documented attempt to drain the hematoma site. The majority of her left lower leg from below the  Medical History: Positive for: Arrhythmia - A fib; Congestive Heart Failure; Hypertension Past Medical History Notes: High Cholesterol Endocrine Medical History: Positive for: Type II Diabetes - since 2019 Genitourinary Medical History: Past Medical History Notes: Kidney Disease CRAIG, WISNEWSKI ([id number]) [id number] Page 8 of 9 Immunizations Pneumococcal Vaccine: Received Pneumococcal Vaccination: No Implantable Devices No devices added Family and Social History Hereditary Spherocytosis: No; Hypertension: No; Kidney Disease: No; Lung Disease: No; Seizures: No; Stroke: No; Thyroid Problems: No; Tuberculosis: No; Never smoker; Marital Status - Widowed; Alcohol Use: Never; Drug Use: No History; Caffeine Use: Never; Financial Concerns: No; Food, Clothing or Shelter Needs: No; Support System Lacking: No; Transportation Concerns: No Electronic Signature(s) Signed: 12/04/2022 9:44:25 AM By: Duanne Guess MD FACS Entered By: Duanne Guess on 12/04/2022 08:18:51 -------------------------------------------------------------------------------- SuperBill  Details Patient Name: Date of Service: [name], Carollee 12/04/2022 Medical Record Number: [id number] Patient Account Number: [id number] Date of Birth/Sex: Treating RN: 05-May-1940 (82 y.o. F) Primary Care Provider: PA TIENT, NO Other Clinician: Referring Provider: Treating Provider/Extender: Sherryl Manges in Treatment: 12 Diagnosis Coding ICD-10 Codes Code Description [phone] 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 Z79.01 Long term (current) use of anticoagulants I50.42 Chronic combined systolic (congestive) and diastolic (congestive) heart failure N18.30 Chronic kidney disease, stage 3 unspecified Facility Procedures : CPT4 Code: [id number] Description: 97597 - DEBRIDE WOUND 1ST 20 SQ CM OR < ICD-10 Diagnosis Description L97.822 Non-pressure chronic ulcer of other part of left lower leg with fat layer expose Modifier: d Quantity: 1 Physician Procedures : CPT4 Code Description Modifier [id number] 99213 - WC PHYS LEVEL 3 - EST PT 25 ICD-10 Diagnosis Description L97.822 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  Z79.01 Long term (current) use of anticoagulants I50.42 Chronic combined systolic (congestive) and diastolic (congestive) heart failure Quantity: 1 : [id number] 97597 - WC PHYS DEBR WO ANESTH 20 SQ CM ICD-10 Diagnosis Description L97.822 Non-pressure chronic ulcer of other part of left lower leg with fat layer exposed Quantity: 1 Electronic Signature(s) Signed: 12/04/2022 8:21:35 AM By: Duanne Guess MD FACS Entered By: Duanne Guess on 12/04/2022 08:21:35 Antony [place]de[place] ([id number]) [id number] Page 9 of 9  noted as No Abnormality. 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 There was a Selective/Open Wound Non-Viable Tissue Debridement with a total area of 0.09 sq cm performed by Duanne Guess, MD. With the following instrument(s): Curette to remove Non-Viable tissue/material. Material removed includes Eschar after achieving pain control using Lidocaine 4% T opical Solution. No specimens were taken. A time out was conducted at 08:05, prior to the start of the procedure. A Minimum amount of bleeding was controlled with Pressure. The procedure was tolerated well with a pain level of 0 throughout and a pain level of 0 following the procedure. Post Debridement Measurements: 0.3cm length x 0.2cm width x 0.1cm depth; 0.005cm^3  volume. Character of Wound/Ulcer Post Debridement is improved. Severity of Tissue Post Debridement is: Fat layer exposed. Post procedure Diagnosis Wound #1: Same as Pre-Procedure DELBERTA, FOLTS ([id number]) [id number].pdf Page 7 of 9 Plan Follow-up Appointments: Return Appointment in 1 week. - Dr. Lady Gary - RM 1 Mon 10/7 at 07:30AM Anesthetic: (In clinic) Topical Lidocaine 4% applied to wound bed Bathing/ Shower/ Hygiene: May shower with protection but do not get wound dressing(s) wet. Protect dressing(s) with water repellant cover (for example, large plastic bag) or a cast cover and may then take shower. Edema Control - Lymphedema / SCD / Other: Elevate legs to the level of the heart or above for 30 minutes daily and/or when sitting for 3-4 times a day throughout the day. Avoid standing for long periods of time. Exercise regularly WOUND #1: - Lower Leg Wound Laterality: Left, Lateral Cleanser: Soap and Water 1 x Per Day/30 Days Discharge Instructions: May shower and wash wound with dial antibacterial soap and water prior to dressing change. Cleanser: Vashe 5.8 (oz) 1 x Per Day/30 Days Discharge Instructions: Cleanse the wound with Vashe prior to applying a clean dressing using gauze sponges, not tissue or cotton balls. Prim Dressing: Maxorb Extra Ag+ Alginate Dressing, 2x2 (in/in) 1 x Per Day/30 Days ary Discharge Instructions: Apply to wound bed as instructed Secondary Dressing: Woven Gauze Sponge, Non-Sterile 4x4 in 1 x Per Day/30 Days Discharge Instructions: Apply over primary dressing as directed. Com pression Wrap: Kerlix Roll 4.5x3.1 (in/yd) 1 x Per Day/30 Days Discharge Instructions: Apply Kerlix and Coban compression as directed. Com pression Wrap: Coban Self-Adherent Wrap 4x5 (in/yd) 1 x Per Day/30 Days Discharge Instructions: Apply over Kerlix as directed. 12/04/2022: The wound is eschared over. Underneath the eschar, there is a tiny residual opening.  Edema control is good. I used a curette to debride the eschar from the wound. We will continue silver alginate with Kerlix and Coban wrap. I anticipate she will be completely healed at her visit next week. Electronic Signature(s) Signed: 12/04/2022 8:21:20 AM By: Duanne Guess MD FACS Entered By: Duanne Guess on 12/04/2022 08:21:20 -------------------------------------------------------------------------------- HxROS Details Patient Name: Date of Service: [name], Fanchon 12/04/2022 7:30 A M Medical Record Number: [id number] Patient Account Number: [id number] Date of Birth/Sex: Treating RN: [birth year] (82 y.o. F) Primary Care Provider: PA Zenovia Jordan, NO Other Clinician: Referring Provider: Treating Provider/Extender: Sherryl Manges in Treatment: 12 Information Obtained From Patient Cardiovascular  FACS Entered By: Duanne Guess on 12/04/2022 08:18:45 -------------------------------------------------------------------------------- Physical Exam Details Patient Name: Date of Service: MAYUKHA, SYMMONDS 12/04/2022 7:30 A M Medical Record Number: [id number] Patient Account Number: [id number] Date of Birth/Sex: Treating RN: [birth year] (82 y.o. F) Primary Care Provider: PA Zenovia Jordan, NO Other Clinician: Referring Provider: Treating Provider/Extender: Sherryl Manges in Treatment: 12 Constitutional Hypertensive, asymptomatic. Bradycardic, asymptomatic. . . no acute distress. Respiratory Normal work of breathing on room air. Notes 12/04/2022: The wound is eschared over. Underneath the eschar, there is a tiny residual opening. Edema control is good. Electronic Signature(s) Signed: 12/04/2022 8:19:16 AM By: Duanne Guess MD FACS Entered By: Duanne Guess on 12/04/2022 08:19:16 -------------------------------------------------------------------------------- Physician Orders Details Patient Name: Date of Service: [name], Araya 12/04/2022 7:30 A M Medical Record Number: [id number] Patient Account Number: [id number] Date of Birth/Sex: Treating RN: 20-Mar-1940 (82 y.o. [name] Provider: PA Zenovia Jordan, [place] Other Clinician: Referring Provider: Treating Provider/Extender: Sherryl Manges in Treatment: 12 Verbal / Phone Orders: No Diagnosis Coding ICD-10 Coding Code Description [phone] 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 Z79.01 Long term (current) use of anticoagulants I50.42 Chronic combined systolic (congestive) and diastolic  (congestive) heart failure N18.30 Chronic kidney disease, stage 3 unspecified Follow-up Appointments ppointment in 1 week. - Dr. Lady Gary - RM 1 Return A Mon 10/7 at 07:30AM Anesthetic (In clinic) Topical Lidocaine 4% applied to wound bed Bathing/ Shower/ Hygiene May shower with protection but do not get wound dressing(s) wet. Protect dressing(s) with water repellant cover (for example, large plastic bag) or a cast cover and may then take shower. [place], [place] ([id number]) [id number] Page 4 of 9 Edema Control - Lymphedema / SCD / Other Elevate legs to the level of the heart or above for 30 minutes daily and/or when sitting for 3-4 times a day throughout the day. Avoid standing for long periods of time. Exercise regularly Wound Treatment Wound #1 - Lower Leg Wound Laterality: Left, Lateral Cleanser: Soap and Water 1 x Per Day/30 Days Discharge Instructions: May shower and wash wound with dial antibacterial soap and water prior to dressing change. Cleanser: Vashe 5.8 (oz) 1 x Per Day/30 Days Discharge Instructions: Cleanse the wound with Vashe prior to applying a clean dressing using gauze sponges, not tissue or cotton balls. Prim Dressing: Maxorb Extra Ag+ Alginate Dressing, 2x2 (in/in) 1 x Per Day/30 Days ary Discharge Instructions: Apply to wound bed as instructed Secondary Dressing: Woven Gauze Sponge, Non-Sterile 4x4 in 1 x Per Day/30 Days Discharge Instructions: Apply over primary dressing as directed. Compression Wrap: Kerlix Roll 4.5x3.1 (in/yd) 1 x Per Day/30 Days Discharge Instructions: Apply Kerlix and Coban compression as directed. Compression Wrap: Coban Self-Adherent Wrap 4x5 (in/yd) 1 x Per Day/30 Days Discharge Instructions: Apply over Kerlix as directed. Electronic Signature(s) Signed: 12/04/2022 9:44:25 AM By: Duanne Guess MD FACS Entered By: Duanne Guess on 12/04/2022  08:19:28 -------------------------------------------------------------------------------- Problem List Details Patient Name: Date of Service: [name], Donita 12/04/2022 7:30 A M Medical Record Number: [id number] Patient Account Number: [id number] Date of Birth/Sex: Treating RN: 14-Aug-1940 (82 y.o. [name] Provider: PA Zenovia Jordan, [place] Other Clinician: Referring Provider: Treating Provider/Extender: Sherryl Manges in Treatment: 12 Active Problems ICD-10 Encounter Code Description Active Date MDM Diagnosis L97.822 Non-pressure chronic ulcer of other part of left lower leg with fat layer exposed7/05/2022 No Yes E11.622 Type 2 diabetes mellitus with other skin ulcer 09/06/2022 No  noted as No Abnormality. 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 There was a Selective/Open Wound Non-Viable Tissue Debridement with a total area of 0.09 sq cm performed by Duanne Guess, MD. With the following instrument(s): Curette to remove Non-Viable tissue/material. Material removed includes Eschar after achieving pain control using Lidocaine 4% T opical Solution. No specimens were taken. A time out was conducted at 08:05, prior to the start of the procedure. A Minimum amount of bleeding was controlled with Pressure. The procedure was tolerated well with a pain level of 0 throughout and a pain level of 0 following the procedure. Post Debridement Measurements: 0.3cm length x 0.2cm width x 0.1cm depth; 0.005cm^3  volume. Character of Wound/Ulcer Post Debridement is improved. Severity of Tissue Post Debridement is: Fat layer exposed. Post procedure Diagnosis Wound #1: Same as Pre-Procedure DELBERTA, FOLTS ([id number]) [id number].pdf Page 7 of 9 Plan Follow-up Appointments: Return Appointment in 1 week. - Dr. Lady Gary - RM 1 Mon 10/7 at 07:30AM Anesthetic: (In clinic) Topical Lidocaine 4% applied to wound bed Bathing/ Shower/ Hygiene: May shower with protection but do not get wound dressing(s) wet. Protect dressing(s) with water repellant cover (for example, large plastic bag) or a cast cover and may then take shower. Edema Control - Lymphedema / SCD / Other: Elevate legs to the level of the heart or above for 30 minutes daily and/or when sitting for 3-4 times a day throughout the day. Avoid standing for long periods of time. Exercise regularly WOUND #1: - Lower Leg Wound Laterality: Left, Lateral Cleanser: Soap and Water 1 x Per Day/30 Days Discharge Instructions: May shower and wash wound with dial antibacterial soap and water prior to dressing change. Cleanser: Vashe 5.8 (oz) 1 x Per Day/30 Days Discharge Instructions: Cleanse the wound with Vashe prior to applying a clean dressing using gauze sponges, not tissue or cotton balls. Prim Dressing: Maxorb Extra Ag+ Alginate Dressing, 2x2 (in/in) 1 x Per Day/30 Days ary Discharge Instructions: Apply to wound bed as instructed Secondary Dressing: Woven Gauze Sponge, Non-Sterile 4x4 in 1 x Per Day/30 Days Discharge Instructions: Apply over primary dressing as directed. Com pression Wrap: Kerlix Roll 4.5x3.1 (in/yd) 1 x Per Day/30 Days Discharge Instructions: Apply Kerlix and Coban compression as directed. Com pression Wrap: Coban Self-Adherent Wrap 4x5 (in/yd) 1 x Per Day/30 Days Discharge Instructions: Apply over Kerlix as directed. 12/04/2022: The wound is eschared over. Underneath the eschar, there is a tiny residual opening.  Edema control is good. I used a curette to debride the eschar from the wound. We will continue silver alginate with Kerlix and Coban wrap. I anticipate she will be completely healed at her visit next week. Electronic Signature(s) Signed: 12/04/2022 8:21:20 AM By: Duanne Guess MD FACS Entered By: Duanne Guess on 12/04/2022 08:21:20 -------------------------------------------------------------------------------- HxROS Details Patient Name: Date of Service: [name], Fanchon 12/04/2022 7:30 A M Medical Record Number: [id number] Patient Account Number: [id number] Date of Birth/Sex: Treating RN: [birth year] (82 y.o. F) Primary Care Provider: PA Zenovia Jordan, NO Other Clinician: Referring Provider: Treating Provider/Extender: Sherryl Manges in Treatment: 12 Information Obtained From Patient Cardiovascular  noted as No Abnormality. 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 There was a Selective/Open Wound Non-Viable Tissue Debridement with a total area of 0.09 sq cm performed by Duanne Guess, MD. With the following instrument(s): Curette to remove Non-Viable tissue/material. Material removed includes Eschar after achieving pain control using Lidocaine 4% T opical Solution. No specimens were taken. A time out was conducted at 08:05, prior to the start of the procedure. A Minimum amount of bleeding was controlled with Pressure. The procedure was tolerated well with a pain level of 0 throughout and a pain level of 0 following the procedure. Post Debridement Measurements: 0.3cm length x 0.2cm width x 0.1cm depth; 0.005cm^3  volume. Character of Wound/Ulcer Post Debridement is improved. Severity of Tissue Post Debridement is: Fat layer exposed. Post procedure Diagnosis Wound #1: Same as Pre-Procedure DELBERTA, FOLTS ([id number]) [phone].pdf Page 7 of 9 Plan Follow-up Appointments: Return Appointment in 1 week. - Dr. Lady Gary - RM 1 Mon 10/7 at 07:30AM Anesthetic: (In clinic) Topical Lidocaine 4% applied to wound bed Bathing/ Shower/ Hygiene: May shower with protection but do not get wound dressing(s) wet. Protect dressing(s) with water repellant cover (for example, large plastic bag) or a cast cover and may then take shower. Edema Control - Lymphedema / SCD / Other: Elevate legs to the level of the heart or above for 30 minutes daily and/or when sitting for 3-4 times a day throughout the day. Avoid standing for long periods of time. Exercise regularly WOUND #1: - Lower Leg Wound Laterality: Left, Lateral Cleanser: Soap and Water 1 x Per Day/30 Days Discharge Instructions: May shower and wash wound with dial antibacterial soap and water prior to dressing change. Cleanser: Vashe 5.8 (oz) 1 x Per Day/30 Days Discharge Instructions: Cleanse the wound with Vashe prior to applying a clean dressing using gauze sponges, not tissue or cotton balls. Prim Dressing: Maxorb Extra Ag+ Alginate Dressing, 2x2 (in/in) 1 x Per Day/30 Days ary Discharge Instructions: Apply to wound bed as instructed Secondary Dressing: Woven Gauze Sponge, Non-Sterile 4x4 in 1 x Per Day/30 Days Discharge Instructions: Apply over primary dressing as directed. Com pression Wrap: Kerlix Roll 4.5x3.1 (in/yd) 1 x Per Day/30 Days Discharge Instructions: Apply Kerlix and Coban compression as directed. Com pression Wrap: Coban Self-Adherent Wrap 4x5 (in/yd) 1 x Per Day/30 Days Discharge Instructions: Apply over Kerlix as directed. 12/04/2022: The wound is eschared over. Underneath the eschar, there is a tiny residual opening.  Edema control is good. I used a curette to debride the eschar from the wound. We will continue silver alginate with Kerlix and Coban wrap. I anticipate she will be completely healed at her visit next week. Electronic Signature(s) Signed: 12/04/2022 8:21:20 AM By: Duanne Guess MD FACS Entered By: Duanne Guess on 12/04/2022 08:21:20 -------------------------------------------------------------------------------- HxROS Details Patient Name: Date of Service: [name], Fanchon 12/04/2022 7:30 A M Medical Record Number: [id number] Patient Account Number: [id number] Date of Birth/Sex: Treating RN: [birth year] (82 y.o. F) Primary Care Provider: PA Zenovia Jordan, NO Other Clinician: Referring Provider: Treating Provider/Extender: Sherryl Manges in Treatment: 12 Information Obtained From Patient Cardiovascular

## 2022-12-11 ENCOUNTER — Encounter (HOSPITAL_BASED_OUTPATIENT_CLINIC_OR_DEPARTMENT_OTHER): Payer: Medicare Other | Attending: General Surgery | Admitting: General Surgery

## 2022-12-11 DIAGNOSIS — W2203XA Walked into furniture, initial encounter: Secondary | ICD-10-CM | POA: Diagnosis not present

## 2022-12-11 DIAGNOSIS — L97822 Non-pressure chronic ulcer of other part of left lower leg with fat layer exposed: Secondary | ICD-10-CM | POA: Insufficient documentation

## 2022-12-11 DIAGNOSIS — I5042 Chronic combined systolic (congestive) and diastolic (congestive) heart failure: Secondary | ICD-10-CM | POA: Diagnosis not present

## 2022-12-11 DIAGNOSIS — Z7901 Long term (current) use of anticoagulants: Secondary | ICD-10-CM | POA: Diagnosis not present

## 2022-12-11 DIAGNOSIS — N183 Chronic kidney disease, stage 3 unspecified: Secondary | ICD-10-CM | POA: Insufficient documentation

## 2022-12-11 DIAGNOSIS — E1122 Type 2 diabetes mellitus with diabetic chronic kidney disease: Secondary | ICD-10-CM | POA: Insufficient documentation

## 2022-12-11 DIAGNOSIS — I130 Hypertensive heart and chronic kidney disease with heart failure and stage 1 through stage 4 chronic kidney disease, or unspecified chronic kidney disease: Secondary | ICD-10-CM | POA: Diagnosis not present

## 2022-12-11 DIAGNOSIS — E11622 Type 2 diabetes mellitus with other skin ulcer: Secondary | ICD-10-CM | POA: Insufficient documentation

## 2022-12-11 NOTE — Progress Notes (Signed)
Diabetic Wound/Ulcer of the Lower N/A N/A Primary Etiology: Extremity Arrhythmia, Congestive Heart Failure, N/A N/A Comorbid History: Hypertension, Type II Diabetes 08/05/2022 N/A N/A Date Acquired: 13 N/A N/A Weeks of Treatment: Open N/A N/A Wound Status: No N/A N/A Wound Recurrence: Yes N/A N/A Clustered Wound: 0x0x0 N/A N/A Measurements L x W x D (cm) 0 N/A N/A A (cm) : rea 0 N/A N/A Volume (cm) : 100.00% N/A N/A % Reduction in A rea: 100.00% N/A N/A % Reduction in Volume: Grade 2 N/A N/A Classification: Small N/A N/A Exudate A mount: Serosanguineous N/A N/A Exudate Type: red, brown N/A N/A Exudate Color: Indistinct, nonvisible N/A N/A Wound Margin: None Present (0%) N/A N/A Granulation A mount: None Present (0%) N/A N/A Necrotic A mount: Fascia: No N/A N/A Exposed Structures: Fat Layer (Subcutaneous Tissue): No Tendon: No Muscle: No Joint: No [name] Long, [name] Long ([id number]) [phone].pdf Page 5 of 8 Bone: No Large (67-100%) N/A N/A Epithelialization: No Abnormalities Noted N/A N/A Periwound Skin Texture: No Abnormalities Noted N/A N/A Periwound Skin Moisture: Hemosiderin Staining: Yes N/A N/A Periwound Skin  Color: No Abnormality N/A N/A Temperature: Treatment Notes Electronic Signature(s) Signed: 12/11/2022 7:59:28 AM By: Duanne Guess MD FACS Entered By: Duanne Guess on 12/11/2022 04:59:27 -------------------------------------------------------------------------------- Multi-Disciplinary Care Plan Details Patient Name: Date of Service: [name] Long, [name] Long 12/11/2022 7:30 A M Medical Record Number: [id number] Patient Account Number: [id number] Date of Birth/Sex: Treating RN: February 08, 1941 (82 y.o. [name] Long Primary Care [name] Long: PA [name] Long, [place] Other Clinician: Referring [name] Long: Treating [name] Long/Extender: [name] Long in Treatment: 13 Multidisciplinary Care Plan reviewed with physician Active Inactive Electronic Signature(s) Signed: 12/11/2022 4:52:08 PM By: [name] RN, BSN Entered By: [name] on 12/11/2022 04:49:32 -------------------------------------------------------------------------------- Pain Assessment Details Patient Name: Date of Service: [name] Long, [name] Long 12/11/2022 7:30 A M Medical Record Number: [id number] Patient Account Number: [id number] Date of Birth/Sex: Treating RN: 1940/06/26 (82 y.o. [name] Long Primary Care [name] Long: PA [name] Long, [place] Other Clinician: Referring [name] Long: Treating [name] Long/Extender: [name] Long in Treatment: 13 Active Problems Location of Pain Severity and Description of Pain Patient Has Paino No Site Locations Rate the pain. [place], [place] ([id number]) [id number] Page 6 of 8 Rate the pain. Current Pain Level: 0 Pain Management and Medication Current Pain Management: Electronic Signature(s) Signed: 12/11/2022 4:52:08 PM By: [name] RN, BSN Entered By: [name] on 12/11/2022 04:39:41 -------------------------------------------------------------------------------- Patient/Caregiver Education Details Patient Name: Date of Service: [name] Long, [name] Long  10/7/2024andnbsp7:30 A M Medical Record Number: [id number] Patient Account Number: [id number] Date of Birth/Gender: Treating RN: 05/24/1940 (82 y.o. [name] Long Primary Care Physician: PA [name] Long, [place] Other Clinician: Referring Physician: Treating Physician/Extender: [name] Long in Treatment: 13 Education Assessment Education Provided To: Patient Education Topics Provided Venous: Methods: Explain/Verbal Responses: Reinforcements needed, State content correctly Wound/Skin Impairment: Methods: Explain/Verbal Responses: Reinforcements needed, State content correctly Electronic Signature(s) Signed: 12/11/2022 4:52:08 PM By: [name] RN, BSN Entered By: [name] on 12/11/2022 04:49:52 -------------------------------------------------------------------------------- Wound Assessment Details Patient Name: Date of Service: [name] Long, [name] Long 12/11/2022 7:30 A [name] Long, [name] Long ([id number]) [id number] Page 7 of 8 Medical Record Number: [id number] Patient Account Number: [id number] Date of Birth/Sex: Treating RN: 04/22/1940 (82 y.o. [name] Long Primary Care Allayah Raineri: PA [name] Long, [place] Other Clinician: Referring Bladimir Auman: Treating [name] Long in Treatment: 13 Wound Status Wound Number: 1 Primary Diabetic Wound/Ulcer of the Lower Extremity Etiology: Wound Location: Left, Lateral Lower Leg Wound Status: Open Wounding Event: Hematoma Comorbid Arrhythmia, Congestive Heart Failure, Hypertension, Type II Date Acquired: 08/05/2022  History: Diabetes Weeks Of Treatment: 13 Clustered Wound: Yes Photos Wound Measurements Length: (cm) Width: (cm) Depth: (cm) Area: (cm) Volume: (cm) 0 % Reduction in Area: 100% 0 % Reduction in Volume: 100% 0 Epithelialization: Large (67-100%) 0 Tunneling: No 0 Undermining: No Wound Description Classification: Grade 2 Wound Margin: Indistinct, nonvisible Exudate Amount:  Small Exudate Type: Serosanguineous Exudate Color: red, brown Foul Odor After Cleansing: No Slough/Fibrino No Wound Bed Granulation Amount: None Present (0%) Exposed Structure Necrotic Amount: None Present (0%) Fascia Exposed: No Fat Layer (Subcutaneous Tissue) Exposed: No Tendon Exposed: No Muscle Exposed: No Joint Exposed: No Bone Exposed: No Periwound Skin Texture Texture Color No Abnormalities Noted: Yes No Abnormalities Noted: No Hemosiderin Staining: Yes Moisture No Abnormalities Noted: Yes Temperature / Pain Temperature: No Abnormality Electronic Signature(s) Signed: 12/11/2022 4:52:08 PM By: [name] RN, BSN Entered By: [name] on 12/11/2022 04:48:53 -------------------------------------------------------------------------------- Vitals Details Patient Name: Date of Service: [name] Long, [name] Long 12/11/2022 7:30 A [name] Long, [name] Long ([id number]) [id number] Page 8 of 8 Medical Record Number: [id number] Patient Account Number: [id number] Date of Birth/Sex: Treating RN: 02/06/1941 (82 y.o. [name] Long Primary Care Siyona Coto: PA [name] Long, [place] Other Clinician: Referring Jossette Zirbel: Treating Josph Norfleet/Extender: [name] Long in Treatment: 13 Vital Signs Time Taken: 07:39 Temperature (F): 97.9 Height (in): 64 Pulse (bpm): 59 Weight (lbs): 162 Respiratory Rate (breaths/min): 18 Body Mass Index (BMI): 27.8 Blood Pressure (mmHg): 157/78 Reference Range: 80 - 120 mg / dl Electronic Signature(s) Signed: 12/11/2022 4:52:08 PM By: [name] RN, BSN Entered By: [name] on 12/11/2022 04:39:34  History: Diabetes Weeks Of Treatment: 13 Clustered Wound: Yes Photos Wound Measurements Length: (cm) Width: (cm) Depth: (cm) Area: (cm) Volume: (cm) 0 % Reduction in Area: 100% 0 % Reduction in Volume: 100% 0 Epithelialization: Large (67-100%) 0 Tunneling: No 0 Undermining: No Wound Description Classification: Grade 2 Wound Margin: Indistinct, nonvisible Exudate Amount:  Small Exudate Type: Serosanguineous Exudate Color: red, brown Foul Odor After Cleansing: No Slough/Fibrino No Wound Bed Granulation Amount: None Present (0%) Exposed Structure Necrotic Amount: None Present (0%) Fascia Exposed: No Fat Layer (Subcutaneous Tissue) Exposed: No Tendon Exposed: No Muscle Exposed: No Joint Exposed: No Bone Exposed: No Periwound Skin Texture Texture Color No Abnormalities Noted: Yes No Abnormalities Noted: No Hemosiderin Staining: Yes Moisture No Abnormalities Noted: Yes Temperature / Pain Temperature: No Abnormality Electronic Signature(s) Signed: 12/11/2022 4:52:08 PM By: [name] RN, BSN Entered By: [name] on 12/11/2022 04:48:53 -------------------------------------------------------------------------------- Vitals Details Patient Name: Date of Service: [name] Long, [name] Long 12/11/2022 7:30 A [name] Long, [name] Long ([id number]) [id number] Page 8 of 8 Medical Record Number: [id number] Patient Account Number: [id number] Date of Birth/Sex: Treating RN: 02/06/1941 (82 y.o. [name] Long Primary Care Siyona Coto: PA [name] Long, [place] Other Clinician: Referring Jossette Zirbel: Treating Josph Nor[place]/Extender: [name] Long in Treatment: 13 Vital Signs Time Taken: 07:39 Temperature (F): 97.9 Height (in): 64 Pulse (bpm): 59 Weight (lbs): 162 Respiratory Rate (breaths/min): 18 Body Mass Index (BMI): 27.8 Blood Pressure (mmHg): 157/78 Reference Range: 80 - 120 mg / dl Electronic Signature(s) Signed: 12/11/2022 4:52:08 PM By: [name] RN, BSN Entered By: [name] on 12/11/2022 04:39:34  History: Diabetes Weeks Of Treatment: 13 Clustered Wound: Yes Photos Wound Measurements Length: (cm) Width: (cm) Depth: (cm) Area: (cm) Volume: (cm) 0 % Reduction in Area: 100% 0 % Reduction in Volume: 100% 0 Epithelialization: Large (67-100%) 0 Tunneling: No 0 Undermining: No Wound Description Classification: Grade 2 Wound Margin: Indistinct, nonvisible Exudate Amount:  Small Exudate Type: Serosanguineous Exudate Color: red, brown Foul Odor After Cleansing: No Slough/Fibrino No Wound Bed Granulation Amount: None Present (0%) Exposed Structure Necrotic Amount: None Present (0%) Fascia Exposed: No Fat Layer (Subcutaneous Tissue) Exposed: No Tendon Exposed: No Muscle Exposed: No Joint Exposed: No Bone Exposed: No Periwound Skin Texture Texture Color No Abnormalities Noted: Yes No Abnormalities Noted: No Hemosiderin Staining: Yes Moisture No Abnormalities Noted: Yes Temperature / Pain Temperature: No Abnormality Electronic Signature(s) Signed: 12/11/2022 4:52:08 PM By: [name] RN, BSN Entered By: [name] on 12/11/2022 04:48:53 -------------------------------------------------------------------------------- Vitals Details Patient Name: Date of Service: [name] Long, [name] Long 12/11/2022 7:30 A [name] Long, [name] Long ([id number]) [id number] Page 8 of 8 Medical Record Number: [id number] Patient Account Number: [id number] Date of Birth/Sex: Treating RN: 02/06/1941 (82 y.o. [name] Long Primary Care Siyona Coto: PA [name] Long, [place] Other Clinician: Referring Jossette Zirbel: Treating Josph Norfleet/Extender: [name] Long in Treatment: 13 Vital Signs Time Taken: 07:39 Temperature (F): 97.9 Height (in): 64 Pulse (bpm): 59 Weight (lbs): 162 Respiratory Rate (breaths/min): 18 Body Mass Index (BMI): 27.8 Blood Pressure (mmHg): 157/78 Reference Range: 80 - 120 mg / dl Electronic Signature(s) Signed: 12/11/2022 4:52:08 PM By: [name] RN, BSN Entered By: [name] on 12/11/2022 04:39:34

## 2022-12-11 NOTE — Progress Notes (Addendum)
[place], [place] ([id number]) [id number] Page 1 of 7 Visit Report for 12/11/2022 Chief Complaint Document Details Patient Name: Date of Service: [name] Long, [name] Long 12/11/2022 7:30 A M Medical Record Number: [id number] Patient Account Number: [id number] Date of Birth/Sex: Treating RN: [birth year] (82 y.o. F) Primary Care Provider: PA Zenovia Jordan, NO Other Clinician: Referring Provider: Treating Provider/Extender: Sherryl Manges in Treatment: 13 Information Obtained from: Patient Chief Complaint Patient seen for complaints of Non-Healing Wound. Electronic Signature(s) Signed: 12/11/2022 7:59:35 AM By: Duanne Guess MD FACS Entered By: Duanne Guess on 12/11/2022 04:59:34 -------------------------------------------------------------------------------- HPI Details Patient Name: Date of Service: [name] Long, [name] Long 12/11/2022 7:30 A M Medical Record Number: [id number] Patient Account Number: [id number] Date of Birth/Sex: Treating RN: 1940-10-01 (82 y.o. F) Primary Care Provider: PA TIENT, NO Other Clinician: Referring Provider: Treating Provider/Extender: Sherryl Manges in Treatment: 13 History of Present Illness HPI Description: ADMISSION 09/06/2022 This is an 82 year old type II diabetic (no A1c to review and patient is unaware of her last value) with a history of atrial fibrillation on chronic anticoagulant therapy, lower extremity edema, congestive heart failure, and stage III kidney disease. She divides her time between her home in [place]. [place] and here in [place] where her daughter lives. While in [place], she struck her leg on her bed frame and developed a large hematoma. She was evaluated in the emergency department there and imaging was negative for any deeper injury, such as a fracture. She returned to [facility] and has had an urgent care and emergency department visit to evaluate the site. As there had been no significant improvement in her wound, she  was referred to the wound care center for further evaluation and management. I am unable to see any evidence of an attempt to evacuate the hematoma in the electronic medical record.. There are 2 openings in her skin, but they connect to each other with significant tunneling and undermining. I am unsure if these opened spontaneously or there was a non- documented attempt to drain the hematoma site. The majority of her left lower leg from below the tibial tuberosity to just above the ankle and from the anterior tibia wrapping laterally and posteriorly to nearly the medial aspect of her leg is involved. There is a foul odor coming from her wound. There is no frank pus but there is significant retained clot that has likely served as culture media. ABI in clinic today was 0.84. 09/08/2022: The patient came in today as an add-on due to bloody drainage that was saturating her dressing. On inspection, it appeared that there was no frank fresh blood and no active site of bleeding identified; it appears that more of the hematoma was liquefied and draining from her wound. 09/13/2022: The wound has cleaned up considerably. There is no longer any foul odor and the drainage has decreased significantly. The patient is also complaining of less pain. There is still some nonviable subcutaneous tissue and fat present but overall everything is improved from the last visit. Her culture grew out a polymicrobial population of multiple drug-resistant organisms. She is currently taking Augmentin and levofloxacin as suggested by the culture data. 09/20/2022: The cavity is contracting nicely. There is no longer a connection between the posterior wound and the lateral leg wound. The posterior wound has a little bit of slough on the surface but the larger main wound is very clean. She has been approved for a wound VAC and we will apply that today. 09/25/2022: Post Wound sloughly- alginate Ag. Vac on lateral wound;  Weight: 162 lbs, BMI: 27.8, Temperature: 97.9 F, Pulse: 59 bpm, Respiratory Rate: 18 breaths/min, Blood Pressure: 157/78 mmHg. Respiratory Normal work of breathing on room air. General Notes: 12/11/2022: Her wound is healed. Integumentary (Hair, Skin) Wound #1 status is Open. Original cause of wound was Hematoma. The date acquired was: 08/05/2022. The wound has been in treatment 13 weeks. The wound is located on the Left,Lateral Lower Leg. The wound measures 0cm length x 0cm width x 0cm depth; 0cm^2 area and 0cm^3 volume. There is no tunneling or undermining noted. There is a small amount of serosanguineous drainage noted. The wound margin is indistinct and nonvisible. There is no granulation within the wound bed. There is no necrotic tissue within the wound bed. The periwound skin appearance had no abnormalities noted for texture. The periwound skin appearance had no abnormalities noted for moisture. The periwound skin appearance exhibited: Hemosiderin Staining. Periwound temperature was noted as No Abnormality. 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 Edema Control - Lymphedema / SCD / Other: Elevate legs to  the level of the heart or above for 30 minutes daily and/or when sitting for 3-4 times a day throughout the day. Avoid standing for long periods of time. Exercise regularly Moisturize legs daily. Compression stocking or Garment 20-30 mm/Hg pressure to: [place], [name] ([id number]) [id number] Page 6 of 7 12/11/2022: Her wound is healed. She did bring her compression stocking with her today and I recommended that she wear them bilaterally on a daily basis and keep her legs elevated is much as possible. I recommended the use of a rich, emollient lotion. We will discharge her from the wound care center and she may follow-up as needed. Electronic Signature(s) Signed: 12/11/2022 8:02:19 AM By: Duanne Guess MD FACS Entered By: Duanne Guess on 12/11/2022 05:02:18 -------------------------------------------------------------------------------- HxROS Details Patient Name: Date of Service: [name] Long, [name] Long 12/11/2022 7:30 A M Medical Record Number: [id number] Patient Account Number: [id number] Date of Birth/Sex: Treating RN: May 25, 1940 (82 y.o. F) Primary Care Provider: PA Zenovia Jordan, NO Other Clinician: Referring Provider: Treating Provider/Extender: Sherryl Manges in Treatment: 13 Information Obtained From Patient Cardiovascular Medical History: Positive for: Arrhythmia - A fib; Congestive Heart Failure; Hypertension Past Medical History Notes: High Cholesterol Endocrine Medical History: Positive for: Type II Diabetes - since 2019 Genitourinary Medical History: Past Medical History Notes: Kidney Disease Immunizations Pneumococcal Vaccine: Received Pneumococcal Vaccination: No Implantable Devices No devices added Family and Social History Hereditary Spherocytosis: No; Hypertension: No; Kidney Disease: No; Lung Disease: No; Seizures: No; Stroke: No; Thyroid Problems: No; Tuberculosis: No; Never smoker; Marital Status - Widowed; Alcohol Use: Never; Drug  Use: No History; Caffeine Use: Never; Financial Concerns: No; Food, Clothing or Shelter Needs: No; Support System Lacking: No; Transportation Concerns: No Electronic Signature(s) Signed: 12/11/2022 10:35:48 AM By: Duanne Guess MD FACS Entered By: Duanne Guess on 12/11/2022 05:00:09 -------------------------------------------------------------------------------- SuperBill Details Patient Name: Date of Service: [address] [name] Long, [name] Long 12/11/2022 Antony Odea ([id number]) [id number] Page 7 of 7 Medical Record Number: [id number] Patient Account Number: [id number] Date of Birth/Sex: Treating RN: 26-Mar-1940 (82 y.o. [name] Provider: PA Zenovia Jordan, [place] Other Clinician: Referring Provider: Treating Provider/Extender: Lady Gary,  decreased  undermining. 10/02/2022: There is a little bit of slough on the posterior wound. The lateral leg wound continues to contract at a remarkable rate. It is clean without any slough or other debris accumulation. 10/09/2022: The posterior wound is about half the size as it was last week. The lateral leg wound has contracted remarkably. There is still some undermining from 9-12 o'clock. Everything is very clean. [place], [place] ([id number]) [id number] Page 2 of 7 10/19/2022: Both wounds have contracted considerably. Once again, the posterior wound is about half the size as it was last week. The tunnel is down to about a centimeter. Everything is extremely clean. Edema control is good. 10/23/2022: The posterior wound continues to contract at a remarkable rate. She has had some bleeding from the anterior wound over the last 24 hours to the extent that it clogged the tubing for her wound VAC.. It appears to be coming from the small residual tunneled portion of the wound. No concern for infection. 10/30/2022: The posterior wound is nearly closed. The anterior wound had a little bit of oozing but not to the degree as last week. The tunnel is about the same depth while the orifice has contracted a little bit. No concern for infection. 11/07/2022: The posterior wound is healed. There is a tiny blister overlying the epithelialized surface. The anterior wound tunnel has contracted substantially. The wound surface is clean and healthy-looking. 11/13/2022: The blister over the posterior wound has resolved. The anterior wound just has about 0.4 cm of undermining. Everything is clean and healthy-looking. 11/20/2022: There is no longer any undermining and the wound is about half the size that it was last week. Healthy granulation tissue on the surface without any slough or other debris accumulation. 11/27/2022: The wound is down to 2 small open areas with a bridge of epithelialized tissue between them. The  surfaces are clean with robust granulation tissue present. 12/04/2022: The wound is eschared over. Underneath the eschar, there is a tiny residual opening. Edema control is good. 12/11/2022: Her wound is healed. Electronic Signature(s) Signed: 12/11/2022 8:00:01 AM By: Duanne Guess MD FACS Entered By: Duanne Guess on 12/11/2022 05:00:00 -------------------------------------------------------------------------------- Physical Exam Details Patient Name: Date of Service: [name] Long, [name] Long 12/11/2022 7:30 A M Medical Record Number: [id number] Patient Account Number: [id number] Date of Birth/Sex: Treating RN: 1940/03/30 (82 y.o. F) Primary Care Provider: PA Zenovia Jordan, NO Other Clinician: Referring Provider: Treating Provider/Extender: Sherryl Manges in Treatment: 13 Constitutional Hypertensive, asymptomatic. Slightly bradycardic. . . no acute distress. Respiratory Normal work of breathing on room air. Notes 12/11/2022: Her wound is healed. Electronic Signature(s) Signed: 12/11/2022 8:01:06 AM By: Duanne Guess MD FACS Entered By: Duanne Guess on 12/11/2022 05:01:06 -------------------------------------------------------------------------------- Physician Orders Details Patient Name: Date of Service: [name] Long, [name] Long 12/11/2022 7:30 A M Medical Record Number: [id number] Patient Account Number: [id number] Date of Birth/Sex: Treating RN: Feb 25, 1941 (82 y.o. [name] Provider: PA Zenovia Jordan, [place] Other Clinician: Referring Provider: Treating Provider/Extender: Sherryl Manges in Treatment: 63 Verbal / Phone Orders: No Diagnosis [address] [place], [place] ([id number]) [id number] Page 3 of 7 ICD-10 Coding Code Description [phone] 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 Z79.01 Long term (current) use of anticoagulants I50.42 Chronic combined  systolic (congestive) and diastolic (congestive) heart failure N18.30 Chronic kidney disease, stage 3 unspecified Discharge From [facility] Services Discharge from Wound Care Center Bathing/ Shower/ Hygiene May shower and wash wound with  Weight: 162 lbs, BMI: 27.8, Temperature: 97.9 F, Pulse: 59 bpm, Respiratory Rate: 18 breaths/min, Blood Pressure: 157/78 mmHg. Respiratory Normal work of breathing on room air. General Notes: 12/11/2022: Her wound is healed. Integumentary (Hair, Skin) Wound #1 status is Open. Original cause of wound was Hematoma. The date acquired was: 08/05/2022. The wound has been in treatment 13 weeks. The wound is located on the Left,Lateral Lower Leg. The wound measures 0cm length x 0cm width x 0cm depth; 0cm^2 area and 0cm^3 volume. There is no tunneling or undermining noted. There is a small amount of serosanguineous drainage noted. The wound margin is indistinct and nonvisible. There is no granulation within the wound bed. There is no necrotic tissue within the wound bed. The periwound skin appearance had no abnormalities noted for texture. The periwound skin appearance had no abnormalities noted for moisture. The periwound skin appearance exhibited: Hemosiderin Staining. Periwound temperature was noted as No Abnormality. 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 Edema Control - Lymphedema / SCD / Other: Elevate legs to  the level of the heart or above for 30 minutes daily and/or when sitting for 3-4 times a day throughout the day. Avoid standing for long periods of time. Exercise regularly Moisturize legs daily. Compression stocking or Garment 20-30 mm/Hg pressure to: [place], [name] ([id number]) [id number] Page 6 of 7 12/11/2022: Her wound is healed. She did bring her compression stocking with her today and I recommended that she wear them bilaterally on a daily basis and keep her legs elevated is much as possible. I recommended the use of a rich, emollient lotion. We will discharge her from the wound care center and she may follow-up as needed. Electronic Signature(s) Signed: 12/11/2022 8:02:19 AM By: Duanne Guess MD FACS Entered By: Duanne Guess on 12/11/2022 05:02:18 -------------------------------------------------------------------------------- HxROS Details Patient Name: Date of Service: [name] Long, [name] Long 12/11/2022 7:30 A M Medical Record Number: [id number] Patient Account Number: [id number] Date of Birth/Sex: Treating RN: May 25, 1940 (82 y.o. F) Primary Care Provider: PA Zenovia Jordan, NO Other Clinician: Referring Provider: Treating Provider/Extender: Sherryl Manges in Treatment: 13 Information Obtained From Patient Cardiovascular Medical History: Positive for: Arrhythmia - A fib; Congestive Heart Failure; Hypertension Past Medical History Notes: High Cholesterol Endocrine Medical History: Positive for: Type II Diabetes - since 2019 Genitourinary Medical History: Past Medical History Notes: Kidney Disease Immunizations Pneumococcal Vaccine: Received Pneumococcal Vaccination: No Implantable Devices No devices added Family and Social History Hereditary Spherocytosis: No; Hypertension: No; Kidney Disease: No; Lung Disease: No; Seizures: No; Stroke: No; Thyroid Problems: No; Tuberculosis: No; Never smoker; Marital Status - Widowed; Alcohol Use: Never; Drug  Use: No History; Caffeine Use: Never; Financial Concerns: No; Food, Clothing or Shelter Needs: No; Support System Lacking: No; Transportation Concerns: No Electronic Signature(s) Signed: 12/11/2022 10:35:48 AM By: Duanne Guess MD FACS Entered By: Duanne Guess on 12/11/2022 05:00:09 -------------------------------------------------------------------------------- SuperBill Details Patient Name: Date of Service: [address] [name] Long, [name] Long 12/11/2022 Antony Odea ([id number]) [id number] Page 7 of 7 Medical Record Number: [id number] Patient Account Number: [id number] Date of Birth/Sex: Treating RN: 26-Mar-1940 (82 y.o. [name] Provider: PA Zenovia Jordan, [place] Other Clinician: Referring Provider: Treating Provider/Extender: Lady Gary,  decreased  undermining. 10/02/2022: There is a little bit of slough on the posterior wound. The lateral leg wound continues to contract at a remarkable rate. It is clean without any slough or other debris accumulation. 10/09/2022: The posterior wound is about half the size as it was last week. The lateral leg wound has contracted remarkably. There is still some undermining from 9-12 o'clock. Everything is very clean. [place], [place] ([id number]) [id number] Page 2 of 7 10/19/2022: Both wounds have contracted considerably. Once again, the posterior wound is about half the size as it was last week. The tunnel is down to about a centimeter. Everything is extremely clean. Edema control is good. 10/23/2022: The posterior wound continues to contract at a remarkable rate. She has had some bleeding from the anterior wound over the last 24 hours to the extent that it clogged the tubing for her wound VAC.. It appears to be coming from the small residual tunneled portion of the wound. No concern for infection. 10/30/2022: The posterior wound is nearly closed. The anterior wound had a little bit of oozing but not to the degree as last week. The tunnel is about the same depth while the orifice has contracted a little bit. No concern for infection. 11/07/2022: The posterior wound is healed. There is a tiny blister overlying the epithelialized surface. The anterior wound tunnel has contracted substantially. The wound surface is clean and healthy-looking. 11/13/2022: The blister over the posterior wound has resolved. The anterior wound just has about 0.4 cm of undermining. Everything is clean and healthy-looking. 11/20/2022: There is no longer any undermining and the wound is about half the size that it was last week. Healthy granulation tissue on the surface without any slough or other debris accumulation. 11/27/2022: The wound is down to 2 small open areas with a bridge of epithelialized tissue between them. The  surfaces are clean with robust granulation tissue present. 12/04/2022: The wound is eschared over. Underneath the eschar, there is a tiny residual opening. Edema control is good. 12/11/2022: Her wound is healed. Electronic Signature(s) Signed: 12/11/2022 8:00:01 AM By: Duanne Guess MD FACS Entered By: Duanne Guess on 12/11/2022 05:00:00 -------------------------------------------------------------------------------- Physical Exam Details Patient Name: Date of Service: [name] Long, [name] Long 12/11/2022 7:30 A M Medical Record Number: [id number] Patient Account Number: [id number] Date of Birth/Sex: Treating RN: 1940/03/30 (82 y.o. F) Primary Care Provider: PA Zenovia Jordan, NO Other Clinician: Referring Provider: Treating Provider/Extender: Sherryl Manges in Treatment: 13 Constitutional Hypertensive, asymptomatic. Slightly bradycardic. . . no acute distress. Respiratory Normal work of breathing on room air. Notes 12/11/2022: Her wound is healed. Electronic Signature(s) Signed: 12/11/2022 8:01:06 AM By: Duanne Guess MD FACS Entered By: Duanne Guess on 12/11/2022 05:01:06 -------------------------------------------------------------------------------- Physician Orders Details Patient Name: Date of Service: [name] Long, [name] Long 12/11/2022 7:30 A M Medical Record Number: [id number] Patient Account Number: [id number] Date of Birth/Sex: Treating RN: Feb 25, 1941 (82 y.o. [name] Provider: PA Zenovia Jordan, [place] Other Clinician: Referring Provider: Treating Provider/Extender: Sherryl Manges in Treatment: 63 Verbal / Phone Orders: No Diagnosis [address] [place], [place] ([id number]) [id number] Page 3 of 7 ICD-10 Coding Code Description [phone] 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 Z79.01 Long term (current) use of anticoagulants I50.42 Chronic combined  systolic (congestive) and diastolic (congestive) heart failure N18.30 Chronic kidney disease, stage 3 unspecified Discharge From [facility] Services Discharge from Wound Care Center Bathing/ Shower/ Hygiene May shower and wash wound with  decreased  undermining. 10/02/2022: There is a little bit of slough on the posterior wound. The lateral leg wound continues to contract at a remarkable rate. It is clean without any slough or other debris accumulation. 10/09/2022: The posterior wound is about half the size as it was last week. The lateral leg wound has contracted remarkably. There is still some undermining from 9-12 o'clock. Everything is very clean. [place], [place] ([id number]) [id number] Page 2 of 7 10/19/2022: Both wounds have contracted considerably. Once again, the posterior wound is about half the size as it was last week. The tunnel is down to about a centimeter. Everything is extremely clean. Edema control is good. 10/23/2022: The posterior wound continues to contract at a remarkable rate. She has had some bleeding from the anterior wound over the last 24 hours to the extent that it clogged the tubing for her wound VAC.. It appears to be coming from the small residual tunneled portion of the wound. No concern for infection. 10/30/2022: The posterior wound is nearly closed. The anterior wound had a little bit of oozing but not to the degree as last week. The tunnel is about the same depth while the orifice has contracted a little bit. No concern for infection. 11/07/2022: The posterior wound is healed. There is a tiny blister overlying the epithelialized surface. The anterior wound tunnel has contracted substantially. The wound surface is clean and healthy-looking. 11/13/2022: The blister over the posterior wound has resolved. The anterior wound just has about 0.4 cm of undermining. Everything is clean and healthy-looking. 11/20/2022: There is no longer any undermining and the wound is about half the size that it was last week. Healthy granulation tissue on the surface without any slough or other debris accumulation. 11/27/2022: The wound is down to 2 small open areas with a bridge of epithelialized tissue between them. The  surfaces are clean with robust granulation tissue present. 12/04/2022: The wound is eschared over. Underneath the eschar, there is a tiny residual opening. Edema control is good. 12/11/2022: Her wound is healed. Electronic Signature(s) Signed: 12/11/2022 8:00:01 AM By: Duanne Guess MD FACS Entered By: Duanne Guess on 12/11/2022 05:00:00 -------------------------------------------------------------------------------- Physical Exam Details Patient Name: Date of Service: [name] Long, [name] Long 12/11/2022 7:30 A M Medical Record Number: [id number] Patient Account Number: [id number] Date of Birth/Sex: Treating RN: 1940/03/30 (82 y.o. F) Primary Care Provider: PA Zenovia Jordan, NO Other Clinician: Referring Provider: Treating Provider/Extender: Sherryl Manges in Treatment: 13 Constitutional Hypertensive, asymptomatic. Slightly bradycardic. . . no acute distress. Respiratory Normal work of breathing on room air. Notes 12/11/2022: Her wound is healed. Electronic Signature(s) Signed: 12/11/2022 8:01:06 AM By: Duanne Guess MD FACS Entered By: Duanne Guess on 12/11/2022 05:01:06 -------------------------------------------------------------------------------- Physician Orders Details Patient Name: Date of Service: [name] Long, [name] Long 12/11/2022 7:30 A M Medical Record Number: [id number] Patient Account Number: [id number] Date of Birth/Sex: Treating RN: Feb 25, 1941 (82 y.o. [name] Provider: PA Zenovia Jordan, [place] Other Clinician: Referring Provider: Treating Provider/Extender: Sherryl Manges in Treatment: 63 Verbal / Phone Orders: No Diagnosis [address] [place], [place] ([id number]) [id number] Page 3 of 7 ICD-10 Coding Code Description [phone] 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 Z79.01 Long term (current) use of anticoagulants I50.42 Chronic combined  systolic (congestive) and diastolic (congestive) heart failure N18.30 Chronic kidney disease, stage 3 unspecified Discharge From [facility] Services Discharge from [facility] Bathing/ Shower/ Hygiene May shower and wash wound with

## 2022-12-18 ENCOUNTER — Ambulatory Visit (HOSPITAL_BASED_OUTPATIENT_CLINIC_OR_DEPARTMENT_OTHER): Payer: Medicare Other | Admitting: General Surgery

## 2022-12-25 ENCOUNTER — Ambulatory Visit (HOSPITAL_BASED_OUTPATIENT_CLINIC_OR_DEPARTMENT_OTHER): Payer: Medicare Other | Admitting: General Surgery

## 2023-01-01 ENCOUNTER — Ambulatory Visit (HOSPITAL_BASED_OUTPATIENT_CLINIC_OR_DEPARTMENT_OTHER): Payer: Medicare Other | Admitting: General Surgery

## 2023-02-23 LAB — LAB REPORT - SCANNED
A1c: 6.23
EGFR: 57
TSH: 1.51 (ref 0.41–5.90)

## 2023-12-10 NOTE — Progress Notes (Unsigned)
 Cardiology Office Note:    Date:  12/11/2023   ID:  [name] Long, DOB 03/22/1940, MRN [id number]  PCP:  [name] Long   [facility] Providers Cardiologist:  [name] Fell, MD     Referring MD: No ref. provider found   Chief Complaint  Patient presents with   Atrial Fibrillation    History of Present Illness:    [name] Long is a 83 y.o. female with a hx of congestive heart failure, permanent atrial fibrillation, and valvular heart disease, presenting for follow-up evaluation. The patient has a history of mitral and tricuspid regurgitation, permanent atrial fibrillation, and nonischemic cardiomyopathy. She travels between her home in [place]. [place] and [place] where her daughter lives. 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  She has been managed medically.  Her last echocardiogram in 2024 demonstrated normal LV and RV function with severe biatrial enlargement, mild to moderate mitral regurgitation and moderate to severe tricuspid regurgitation.  The patient is here with her daughter today.  She is spending most of the year in [place], but is here visiting with her daughter.  She brings in labs from December showing a normal hemoglobin of 12.9, creatinine of 0.99, potassium 3.2, ALT 8, cholesterol 169, LDL 103.  She reports that she is doing fine.  She has no chest pain or shortness of breath.  Her leg swelling is greatly improved from last year.  She remains on her medicines and reports no recent changes.  She has had no bleeding problems on chronic oral anticoagulation.  States that her blood pressure is always high when she goes to the doctor but much better controlled at home.  She needs to obtain a new cuff as hers is not working.   Current Medications: Current Meds  Medication Sig   apixaban  (ELIQUIS ) 5 MG TABS tablet Take 5 mg by mouth 2 (two) times daily.   brimonidine  (ALPHAGAN ) 0.2 % ophthalmic solution Place 1  drop into both eyes 2 (two) times daily.   carvedilol (COREG) 25 MG tablet Take 25 mg by mouth 2 (two) times daily with a meal.   ENTRESTO 97-103 MG Take 1 tablet by mouth 2 (two) times daily.   furosemide  (LASIX ) 80 MG tablet Take 1 tablet (80 mg total) by mouth daily.   isosorbide -hydrALAZINE  (BIDIL ) 20-37.5 MG tablet Take 1 tablet by mouth 3 (three) times daily.   JARDIANCE 10 MG TABS tablet Take 10 mg by mouth in the morning.   latanoprost  (XALATAN ) 0.005 % ophthalmic solution Place 1 drop into both eyes at bedtime.     Allergies:   Patient has no known allergies.   ROS:   Please see the history of present illness.    All other systems reviewed and are negative.  EKGs/Labs/Other Studies Reviewed:    The following studies were reviewed today: Cardiac Studies & Procedures   ______________________________________________________________________________________________ CARDIAC CATHETERIZATION  CARDIAC CATHETERIZATION 10/13/2020  Conclusion 1.  Patent coronary arteries with mild diffuse plaquing and no significant stenosis (left dominant) 2.  High cardiac output 7.37 L/min, cardiac index 4.07 L/min/m 3.  No significant pulmonary hypertension with PA pressure 50/11 mean 25, transpulmonary gradient 12 mmHg, PVR 1.6 Wood units.  Overall reassuring study.  Patient with known severe tricuspid regurgitation, but right atrial pressure is not substantially elevated and there are no large V waves in the right atrial pressure tracing.  Would continue current medical management.  Findings Coronary Findings Diagnostic  Dominance:  Left  Left Main The vessel exhibits minimal luminal irregularities. Patent vessel with minimal irregularity, divides into the LAD and left circumflex.  No stenosis.  Left Anterior Descending There is mild diffuse disease throughout the vessel. Large vessel with mild diffuse irregularity, wraps the LV apex and supplies the distal inferoapical wall.  No significant  stenosis throughout the LAD or its diagonal branches.  Left Circumflex The vessel exhibits minimal luminal irregularities. Large, dominant circumflex with no stenosis.  Multiple OM branches are widely patent with no stenosis.  Left PDA is patent.  Right Coronary Artery Vessel is moderate in size. The vessel exhibits minimal luminal irregularities. Moderate-sized, nondominant RCA without significant stenosis  Intervention  No interventions have been documented.     ECHOCARDIOGRAM  ECHOCARDIOGRAM COMPLETE 09/29/2022  Narrative ECHOCARDIOGRAM REPORT    Patient Name:   [name] Long   Date of Exam: 09/29/2022 Medical Rec #:  [id number]     Height:       64.0 in Accession #:    [id number]    Weight:       162.4 lb Date of Birth:  Aug 04, 1940     BSA:          1.791 m Patient Age:    81 years      BP:           110/70 mmHg Patient Gender: F             HR:           62 bpm. Exam Location:  [address]  Procedure: 2D Echo, Cardiac Doppler, Color Doppler, 3D Echo and Strain Analysis  Indications:    I34.0 Nonrheumatic mitral (valve) insufficiency  History:        Patient has prior history of Echocardiogram examinations, most recent 07/29/2021. CHF, Arrythmias:Atrial Fibrillation and PVC, Signs/Symptoms:Fatigue and Dyspnea; Risk Factors:Hypertension, Diabetes and Dyslipidemia. NICM. Dyspnea on exertion. Pedal edema. Obesity. Chronic kidney disease.  Sonographer:    [name] Long RCS Referring Phys: [phone] [name] Long  IMPRESSIONS   1. Left ventricular ejection fraction, by estimation, is 65 to 70%. The left ventricle has normal function. The left ventricle has no regional wall motion abnormalities. There is mild concentric left ventricular hypertrophy. Left ventricular diastolic parameters are indeterminate. The average left ventricular global longitudinal strain is -21.0 %. The global longitudinal strain is normal. 2. Right ventricular systolic function is normal. The right  ventricular size is normal. There is moderately elevated pulmonary artery systolic pressure. 3. Left atrial size was severely dilated. 4. Right atrial size was severely dilated. 5. The mitral valve is normal in structure. Mild to moderate mitral valve regurgitation. No evidence of mitral stenosis. 6. The tricuspid valve is degenerative. Tricuspid valve regurgitation is moderate to severe. 7. The aortic valve is tricuspid. There is moderate calcification of the aortic valve. There is moderate thickening of the aortic valve. Aortic valve regurgitation is not visualized. No aortic stenosis is present. 8. Pulmonic valve regurgitation is severe. 9. Pressure half time 255 ms. 10. The inferior vena cava is dilated in size with <50% respiratory variability, suggesting right atrial pressure of 15 mmHg.  FINDINGS Left Ventricle: Left ventricular ejection fraction, by estimation, is 65 to 70%. The left ventricle has normal function. The left ventricle has no regional wall motion abnormalities. The average left ventricular global longitudinal strain is -21.0 %. The global longitudinal strain is normal. The left ventricular internal cavity size was normal in size. There is mild concentric left ventricular hypertrophy. Left  ventricular diastolic function could not be evaluated due to atrial fibrillation. Left ventricular diastolic parameters are indeterminate.  Right Ventricle: The right ventricular size is normal. No increase in right ventricular wall thickness. Right ventricular systolic function is normal. There is moderately elevated pulmonary artery systolic pressure. The tricuspid regurgitant velocity is 3.35 m/s, and with an assumed right atrial pressure of 15 mmHg, the estimated right ventricular systolic pressure is 59.9 mmHg.  Left Atrium: Left atrial size was severely dilated.  Right Atrium: Right atrial size was severely dilated.  Pericardium: Trivial pericardial effusion is present.  Mitral  Valve: The mitral valve is normal in structure. Mild to moderate mitral valve regurgitation. No evidence of mitral valve stenosis.  Tricuspid Valve: The tricuspid valve is degenerative in appearance. Tricuspid valve regurgitation is moderate to severe. No evidence of tricuspid stenosis.  Aortic Valve: The aortic valve is tricuspid. There is moderate calcification of the aortic valve. There is moderate thickening of the aortic valve. Aortic valve regurgitation is not visualized. No aortic stenosis is present.  Pulmonic Valve: The pulmonic valve was normal in structure. Pulmonic valve regurgitation is severe. No evidence of pulmonic stenosis.  Aorta: The aortic root is normal in size and structure.  Pulmonary Artery: Pressure half time 255 ms.  Venous: The inferior vena cava is dilated in size with less than 50% respiratory variability, suggesting right atrial pressure of 15 mmHg.  IAS/Shunts: No atrial level shunt detected by color flow Doppler.   LEFT VENTRICLE PLAX 2D LVIDd:         4.10 cm   Diastology LVIDs:         2.60 cm   LV e' lateral:   15.47 cm/s LV PW:         1.20 cm   LV E/e' lateral: 7.8 LV IVS:        1.20 cm LVOT diam:     1.90 cm   2D Longitudinal Strain LV SV:         64        2D Strain GLS (A2C):   -19.3 % LV SV Index:   36        2D Strain GLS (A3C):   -25.0 % LVOT Area:     2.84 cm  2D Strain GLS (A4C):   -18.7 % 2D Strain GLS Avg:     -21.0 %  3D Volume EF: 3D EF:        59 % LV EDV:       176 ml LV ESV:       71 ml LV SV:        105 ml  RIGHT VENTRICLE RV Basal diam:  4.30 cm RV S prime:     9.84 cm/s TAPSE (M-mode): 1.7 cm RVSP:           59.9 mmHg  LEFT ATRIUM              Index        RIGHT ATRIUM            Index LA diam:        4.00 cm  2.23 cm/m   RA Pressure: 15.00 mmHg LA Vol (A2C):   89.1 ml  49.76 ml/m  RA Area:     27.90 cm LA Vol (A4C):   123.0 ml 68.69 ml/m  RA Volume:   89.60 ml   50.04 ml/m LA Biplane Vol: 106.0 ml 59.20  ml/m AORTIC VALVE  PULMONIC VALVE LVOT Vmax:   97.87 cm/s  PR End Diast Vel: 5.41 msec LVOT Vmean:  60.967 cm/s LVOT VTI:    0.227 m  AORTA Ao Root diam: 2.50 cm Ao Asc diam:  3.30 cm  MITRAL VALVE                  TRICUSPID VALVE MV Area (PHT): 4.66 cm       TR Peak grad:   44.9 mmHg MV Decel Time: 163 msec       TR Vmax:        335.00 cm/s MR Peak grad:    83.4 mmHg    Estimated RAP:  15.00 mmHg MR Mean grad:    60.2 mmHg    RVSP:           59.9 mmHg MR Vmax:         456.75 cm/s MR Vmean:        365.8 cm/s   SHUNTS MR PISA:         3.08 cm     Systemic VTI:  0.23 m MR PISA Eff ROA: 24 mm       Systemic Diam: 1.90 cm MR PISA Radius:  0.70 cm MV E velocity: 120.33 cm/s MV A velocity: 31.43 cm/s MV E/A ratio:  3.83  Annabella Scarce MD Electronically signed by Annabella Scarce MD Signature Date/Time: 09/29/2022/11:47:23 AM    Final          ______________________________________________________________________________________________      EKG:   EKG Interpretation Date/Time:  Tuesday December 11 2023 09:05:21 EDT Ventricular Rate:  57 PR Interval:    QRS Duration:  102 QT Interval:  428 QTC Calculation: 416 R Axis:   247  Text Interpretation: Atrial fibrillation with slow ventricular response with premature ventricular or aberrantly conducted complexes Right ventricular hypertrophy Septal infarct (cited on or before 29-Aug-2017) Lateral infarct (cited on or before 29-Aug-2017) When compared with ECG of 04-Sep-2022 09:34, No significant change was found Confirmed by Wonda Sharper [phone]) on 12/11/2023 9:28:50 AM    Recent Labs: No results found for requested labs within last 365 days.  Recent Lipid Panel    Component Value Date/Time   CHOL 126 08/28/2017 1511   CHOL 189 12/17/2015 0753   TRIG 51 08/28/2017 1511   TRIG 66 12/17/2015 0753   HDL 38 (L) 08/28/2017 1511   HDL 53 12/17/2015 0753   CHOLHDL 3.3 08/28/2017 1511   VLDL 10 08/28/2017  1511   LDLCALC 78 08/28/2017 1511   LDLCALC 123 (H) 12/17/2015 0753     Risk Assessment/Calculations:    CHA2DS2-VASc Score = 5   This indicates a 7.2% annual risk of stroke. The patient's score is based upon: CHF History: 1 HTN History: 1 Diabetes History: 0 Stroke History: 0 Vascular Disease History: 0 Age Score: 2 Gender Score: 1          Physical Exam:    VS:  BP (!) 180/83   Pulse (!) 57   Ht 5' 4 (1.626 m)   Wt 159 lb 9.6 oz (72.4 kg)   SpO2 97%   BMI 27.40 kg/m     Wt Readings from Last 3 Encounters:  12/11/23 159 lb 9.6 oz (72.4 kg)  09/04/22 162 lb 6.4 oz (73.7 kg)  08/14/22 165 lb (74.8 kg)     GEN:  Well nourished, well developed in no acute distress HEENT: Normal NECK: No JVD; No carotid bruits LYMPHATICS: No lymphadenopathy CARDIAC: Irregularly irregular, 2/6  holosystolic murmur at the left lower sternal border RESPIRATORY:  Clear to auscultation without rales, wheezing or rhonchi  ABDOMEN: Soft, non-tender, non-distended MUSCULOSKELETAL: Chronic stasis dermatitis bilaterally, trace right pretibial edema and no edema on the left; No deformity  SKIN: Warm and dry NEUROLOGIC:  Alert and oriented x 3 PSYCHIATRIC:  Normal affect   Assessment & Plan Chronic combined systolic and diastolic heart failure (HCC) Appears clinically stable on her current medical regimen.  She is asymptomatic at a low activity level.  Continue Entresto, carvedilol, furosemide , and Jardiance at current doses.  Update 2D echocardiogram. Permanent atrial fibrillation (HCC) Anticoagulated with apixaban .  Heart rate controlled.  No lightheadedness or syncope. Nonrheumatic mitral valve regurgitation Moderate mitral regurgitation on last year's echocardiogram.  Need updated study.  Suspected atrial functional MR related to markedly dilated atria. Nonrheumatic tricuspid valve regurgitation Severe tricuspid regurgitation noted, likely atrial functional with markedly dilated right  atrium.  Patient with good symptom control and normal kidney function.  Update labs today.  Continue current medical therapy.     Medication Adjustments/Labs and Tests Ordered: Current medicines are reviewed at length with the patient today.  Concerns regarding medicines are outlined above.  Orders Placed This Encounter  Procedures   CBC   Comprehensive metabolic panel with GFR   EKG [id number]   ECHOCARDIOGRAM COMPLETE   No orders of the defined types were placed in this encounter.   Patient Instructions  Medication Instructions:  No medication changes were made at this visit. Continue current regimen.   *If you need a refill on your cardiac medications before your next appointment, please call your pharmacy*  Lab Work: To be completed today: CBC and CMP  If you have labs (blood work) drawn today and your tests are completely normal, you will receive your results only by: MyChart Message (if you have MyChart) OR A paper copy in the mail If you have any lab test that is abnormal or we need to change your treatment, we will call you to review the results.  Testing/Procedures: Your physician has requested that you have an echocardiogram. Echocardiography is a painless test that uses sound waves to create images of your heart. It provides your doctor with information about the size and shape of your heart and how well your heart's chambers and valves are working. This procedure takes approximately one hour. There are no restrictions for this procedure. Please do NOT wear cologne, perfume, aftershave, or lotions (deodorant is allowed). Please arrive 15 minutes prior to your appointment time.  Please note: We ask at that you not bring children with you during ultrasound (echo/ vascular) testing. Due to room size and safety concerns, children are not allowed in the ultrasound rooms during exams. Our front office staff cannot provide observation of children in our lobby area while testing is  being conducted. An adult accompanying a patient to their appointment will only be allowed in the ultrasound room at the discretion of the ultrasound technician under special circumstances. We apologize for any inconvenience.   Follow-Up: At [facility], you and your health needs are our priority.  As part of our continuing mission to provide you with exceptional heart care, our providers are all part of one team.  This team includes your primary Cardiologist (physician) and Advanced Practice Providers or APPs (Physician Assistants and Nurse Practitioners) who all work together to provide you with the care you need, when you need it.  Your next appointment:   1 year(s)  Provider:  Ozell Fell, MD      Signed, [name] Fell, MD  12/11/2023 1:25 PM    [facility]

## 2023-12-11 ENCOUNTER — Ambulatory Visit: Attending: Cardiovascular Disease | Admitting: Cardiovascular Disease

## 2023-12-11 ENCOUNTER — Encounter: Payer: Self-pay | Admitting: Cardiovascular Disease

## 2023-12-11 VITALS — BP 180/83 | HR 57 | Ht 64.0 in | Wt 159.6 lb

## 2023-12-11 DIAGNOSIS — I340 Nonrheumatic mitral (valve) insufficiency: Secondary | ICD-10-CM

## 2023-12-11 DIAGNOSIS — I361 Nonrheumatic tricuspid (valve) insufficiency: Secondary | ICD-10-CM | POA: Diagnosis not present

## 2023-12-11 DIAGNOSIS — I5042 Chronic combined systolic (congestive) and diastolic (congestive) heart failure: Secondary | ICD-10-CM

## 2023-12-11 DIAGNOSIS — I4821 Permanent atrial fibrillation: Secondary | ICD-10-CM | POA: Diagnosis not present

## 2023-12-11 LAB — CBC

## 2023-12-11 NOTE — Patient Instructions (Signed)
 Medication Instructions:  No medication changes were made at this visit. Continue current regimen.   *If you need a refill on your cardiac medications before your next appointment, please call your pharmacy*  Lab Work: To be completed today: CBC and CMP  If you have labs (blood work) drawn today and your tests are completely normal, you will receive your results only by: MyChart Message (if you have MyChart) OR A paper copy in the mail If you have any lab test that is abnormal or we need to change your treatment, we will call you to review the results.  Testing/Procedures: Your physician has requested that you have an echocardiogram. Echocardiography is a painless test that uses sound waves to create images of your heart. It provides your doctor with information about the size and shape of your heart and how well your heart's chambers and valves are working. This procedure takes approximately one hour. There are no restrictions for this procedure. Please do NOT wear cologne, perfume, aftershave, or lotions (deodorant is allowed). Please arrive 15 minutes prior to your appointment time.  Please note: We ask at that you not bring children with you during ultrasound (echo/ vascular) testing. Due to room size and safety concerns, children are not allowed in the ultrasound rooms during exams. Our front office staff cannot provide observation of children in our lobby area while testing is being conducted. An adult accompanying a patient to their appointment will only be allowed in the ultrasound room at the discretion of the ultrasound technician under special circumstances. We apologize for any inconvenience.   Follow-Up: At [facility], you and your health needs are our priority.  As part of our continuing mission to provide you with exceptional heart care, our providers are all part of one team.  This team includes your primary Cardiologist (physician) and Advanced Practice Providers or  APPs (Physician Assistants and Nurse Practitioners) who all work together to provide you with the care you need, when you need it.  Your next appointment:   1 year(s)  Provider:   Ozell Fell, MD

## 2023-12-12 ENCOUNTER — Ambulatory Visit: Payer: Self-pay | Admitting: Cardiovascular Disease

## 2023-12-12 DIAGNOSIS — I340 Nonrheumatic mitral (valve) insufficiency: Secondary | ICD-10-CM

## 2023-12-12 DIAGNOSIS — I361 Nonrheumatic tricuspid (valve) insufficiency: Secondary | ICD-10-CM

## 2023-12-12 DIAGNOSIS — E875 Hyperkalemia: Secondary | ICD-10-CM

## 2023-12-12 LAB — COMPREHENSIVE METABOLIC PANEL WITH GFR
ALT: 10 IU/L (ref 0–32)
AST: 16 IU/L (ref 0–40)
Albumin: 4 g/dL (ref 3.7–4.7)
Alkaline Phosphatase: 69 IU/L (ref 48–129)
BUN/Creatinine Ratio: 20 (ref 12–28)
BUN: 24 mg/dL (ref 8–27)
Bilirubin Total: 1 mg/dL (ref 0.0–1.2)
CO2: 25 mmol/L (ref 20–29)
Calcium: 9.2 mg/dL (ref 8.7–10.3)
Chloride: 92 mmol/L — ABNORMAL LOW (ref 96–106)
Creatinine, Ser: 1.19 mg/dL — ABNORMAL HIGH (ref 0.57–1.00)
Globulin, Total: 4.2 g/dL (ref 1.5–4.5)
Glucose: 121 mg/dL — ABNORMAL HIGH (ref 70–99)
Potassium: 5.5 mmol/L — ABNORMAL HIGH (ref 3.5–5.2)
Sodium: 133 mmol/L — ABNORMAL LOW (ref 134–144)
Total Protein: 8.2 g/dL (ref 6.0–8.5)
eGFR: 45 mL/min/1.73 — ABNORMAL LOW (ref >59)

## 2023-12-12 LAB — CBC
Hematocrit: 41.6 % (ref 34.0–46.6)
Hemoglobin: 13.7 g/dL (ref 11.1–15.9)
MCH: 31.6 pg (ref 26.6–33.0)
MCHC: 32.9 g/dL (ref 31.5–35.7)
MCV: 96 fL (ref 79–97)
Platelets: 141 x10E3/uL — AB (ref 150–450)
RBC: 4.34 x10E6/uL (ref 3.77–5.28)
RDW: 12.7 % (ref 11.7–15.4)
WBC: 3.5 x10E3/uL (ref 3.4–10.8)

## 2023-12-31 ENCOUNTER — Ambulatory Visit (HOSPITAL_COMMUNITY)
Admission: RE | Admit: 2023-12-31 | Discharge: 2023-12-31 | Disposition: A | Discharge status: Home / Self Care | Source: Ambulatory Visit | Attending: Cardiovascular Disease | Admitting: Cardiovascular Disease

## 2023-12-31 ENCOUNTER — Ambulatory Visit (HOSPITAL_COMMUNITY)

## 2023-12-31 DIAGNOSIS — I340 Nonrheumatic mitral (valve) insufficiency: Secondary | ICD-10-CM

## 2023-12-31 DIAGNOSIS — I130 Hypertensive heart and chronic kidney disease with heart failure and stage 1 through stage 4 chronic kidney disease, or unspecified chronic kidney disease: Secondary | ICD-10-CM | POA: Insufficient documentation

## 2023-12-31 DIAGNOSIS — N189 Chronic kidney disease, unspecified: Secondary | ICD-10-CM | POA: Insufficient documentation

## 2023-12-31 DIAGNOSIS — I083 Combined rheumatic disorders of mitral, aortic and tricuspid valves: Secondary | ICD-10-CM | POA: Diagnosis not present

## 2023-12-31 DIAGNOSIS — I499 Cardiac arrhythmia, unspecified: Secondary | ICD-10-CM | POA: Diagnosis not present

## 2023-12-31 DIAGNOSIS — I509 Heart failure, unspecified: Secondary | ICD-10-CM | POA: Insufficient documentation

## 2023-12-31 DIAGNOSIS — I4891 Unspecified atrial fibrillation: Secondary | ICD-10-CM | POA: Diagnosis not present

## 2023-12-31 DIAGNOSIS — I5042 Chronic combined systolic (congestive) and diastolic (congestive) heart failure: Secondary | ICD-10-CM

## 2023-12-31 LAB — ECHOCARDIOGRAM COMPLETE
MV M vel: 5.3 m/s
MV Peak grad: 112.1 mmHg
S' Lateral: 3.3 cm

## 2023-12-31 NOTE — Progress Notes (Signed)
  Echocardiogram 2D Echocardiogram has been performed.  [name] Long 12/31/2023, 2:48 PM

## 2024-01-01 LAB — BASIC METABOLIC PANEL WITH GFR
BUN/Creatinine Ratio: 26 (ref 12–28)
BUN: 31 mg/dL — ABNORMAL HIGH (ref 8–27)
CO2: 27 mmol/L (ref 20–29)
Calcium: 8.9 mg/dL (ref 8.7–10.3)
Chloride: 91 mmol/L — ABNORMAL LOW (ref 96–106)
Creatinine, Ser: 1.2 mg/dL — ABNORMAL HIGH (ref 0.57–1.00)
Glucose: 105 mg/dL — ABNORMAL HIGH (ref 70–99)
Potassium: 5.4 mmol/L — ABNORMAL HIGH (ref 3.5–5.2)
Sodium: 129 mmol/L — ABNORMAL LOW (ref 134–144)
eGFR: 45 mL/min/1.73 — ABNORMAL LOW (ref >59)

## 2024-01-01 NOTE — Addendum Note (Signed)
 Addended by: GORDON RONAL SQUIBB on: 01/01/2024 05:55 PM   Modules accepted: Orders

## 2024-01-07 NOTE — Addendum Note (Signed)
 Addended by: GORDON RONAL SQUIBB on: 01/07/2024 10:29 AM   Modules accepted: Orders

## 2024-01-25 IMAGING — DX DG CHEST 2V
2 series · 2 of 2 positions shown · non-contrast
Comparison: August 28, 2017

CLINICAL DATA: cough, focal rhonchi

EXAM:
CHEST - 2 VIEW

[chest pa]
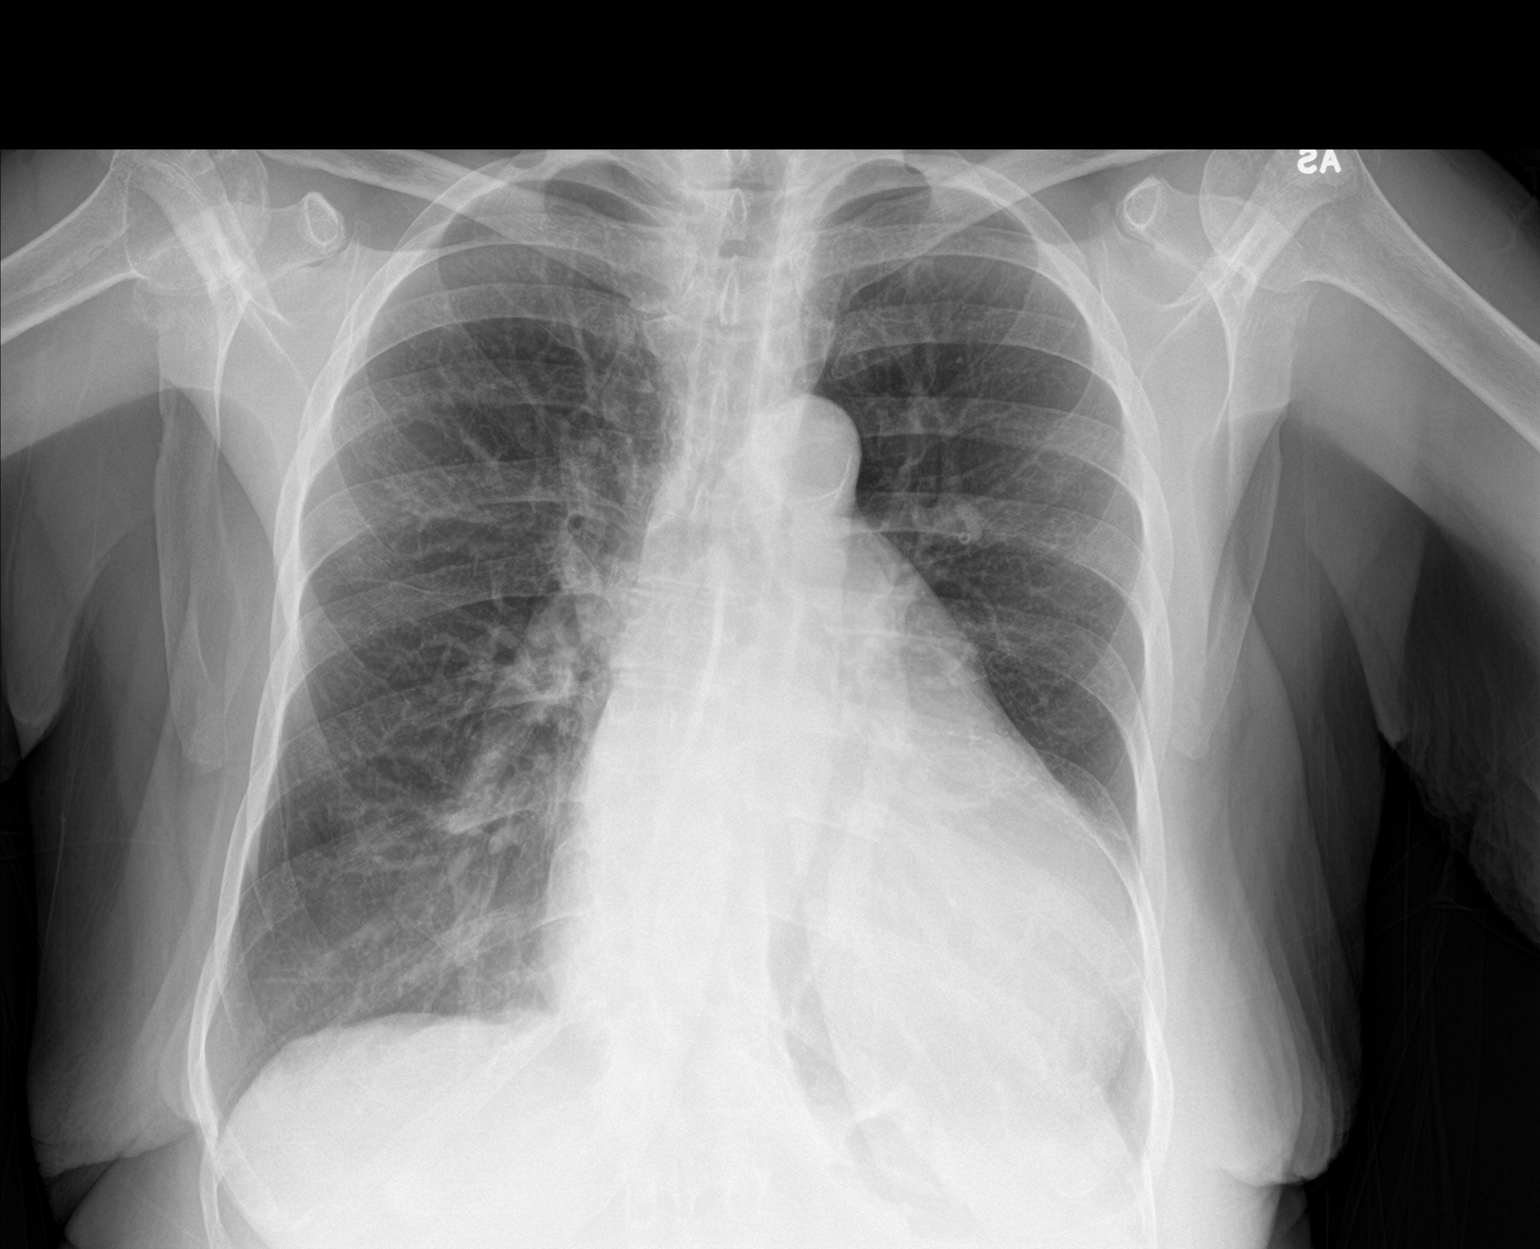

[chest lat]
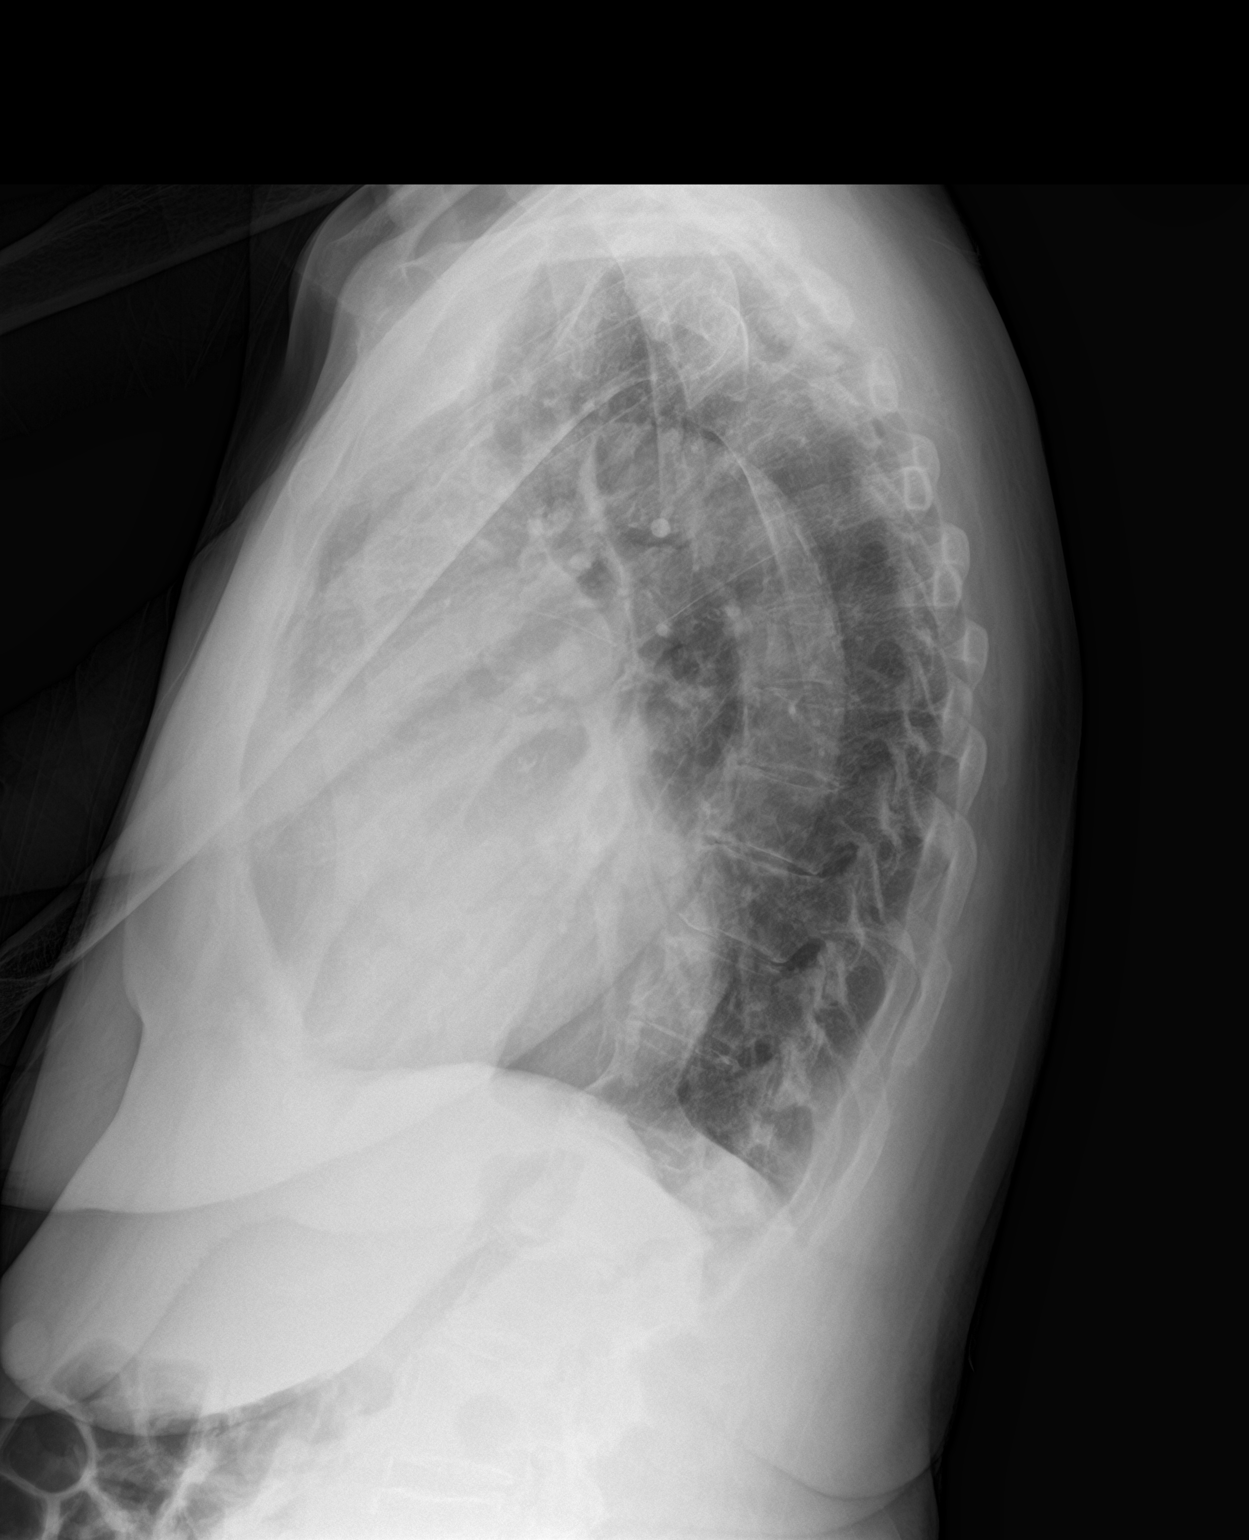

[2 of 2 positions shown; findings below may reference images not displayed]

FINDINGS: Moderately large cardiomegaly. Atheromatous calcifications of the
arch of the aorta. Lungs are clear without focal consolidation,
pleural effusion or significant vascular congestion. Moderate
arthrosis of the visualized shoulder joints.
IMPRESSION: Cardiomegaly.  No acute pleural pulmonary process seen in the lungs.

## 2024-03-17 DIAGNOSIS — I371 Nonrheumatic pulmonary valve insufficiency: Secondary | ICD-10-CM | POA: Diagnosis present

## 2024-03-17 DIAGNOSIS — W19XXXA Unspecified fall, initial encounter: Secondary | ICD-10-CM | POA: Diagnosis present

## 2024-03-17 DIAGNOSIS — I2721 Secondary pulmonary arterial hypertension: Principal | ICD-10-CM | POA: Diagnosis present

## 2024-03-17 DIAGNOSIS — I130 Hypertensive heart and chronic kidney disease with heart failure and stage 1 through stage 4 chronic kidney disease, or unspecified chronic kidney disease: Secondary | ICD-10-CM | POA: Diagnosis present

## 2024-03-17 DIAGNOSIS — I081 Rheumatic disorders of both mitral and tricuspid valves: Secondary | ICD-10-CM | POA: Diagnosis present

## 2024-03-17 DIAGNOSIS — I428 Other cardiomyopathies: Secondary | ICD-10-CM | POA: Diagnosis present

## 2024-03-17 DIAGNOSIS — E1122 Type 2 diabetes mellitus with diabetic chronic kidney disease: Secondary | ICD-10-CM | POA: Diagnosis present

## 2024-03-17 DIAGNOSIS — S0003XA Contusion of scalp, initial encounter: Secondary | ICD-10-CM | POA: Diagnosis present

## 2024-03-17 DIAGNOSIS — I5043 Acute on chronic combined systolic (congestive) and diastolic (congestive) heart failure: Secondary | ICD-10-CM | POA: Diagnosis present

## 2024-03-17 DIAGNOSIS — Z1152 Encounter for screening for COVID-19: Secondary | ICD-10-CM

## 2024-03-17 DIAGNOSIS — E871 Hypo-osmolality and hyponatremia: Secondary | ICD-10-CM | POA: Diagnosis present

## 2024-03-17 DIAGNOSIS — Z7901 Long term (current) use of anticoagulants: Secondary | ICD-10-CM

## 2024-03-17 DIAGNOSIS — Z7984 Long term (current) use of oral hypoglycemic drugs: Secondary | ICD-10-CM

## 2024-03-17 DIAGNOSIS — I3139 Other pericardial effusion (noninflammatory): Secondary | ICD-10-CM | POA: Diagnosis present

## 2024-03-17 DIAGNOSIS — R262 Difficulty in walking, not elsewhere classified: Secondary | ICD-10-CM | POA: Diagnosis present

## 2024-03-17 DIAGNOSIS — D472 Monoclonal gammopathy: Secondary | ICD-10-CM | POA: Diagnosis present

## 2024-03-17 DIAGNOSIS — Z79899 Other long term (current) drug therapy: Secondary | ICD-10-CM

## 2024-03-17 DIAGNOSIS — N1832 Chronic kidney disease, stage 3b: Secondary | ICD-10-CM | POA: Diagnosis present

## 2024-03-17 DIAGNOSIS — N179 Acute kidney failure, unspecified: Secondary | ICD-10-CM | POA: Diagnosis present

## 2024-03-17 DIAGNOSIS — I358 Other nonrheumatic aortic valve disorders: Secondary | ICD-10-CM | POA: Diagnosis present

## 2024-03-17 DIAGNOSIS — I5082 Biventricular heart failure: Secondary | ICD-10-CM | POA: Diagnosis present

## 2024-03-17 DIAGNOSIS — I4821 Permanent atrial fibrillation: Secondary | ICD-10-CM | POA: Diagnosis present

## 2024-03-18 ENCOUNTER — Emergency Department (HOSPITAL_COMMUNITY)

## 2024-03-18 ENCOUNTER — Other Ambulatory Visit: Payer: Self-pay

## 2024-03-18 ENCOUNTER — Inpatient Hospital Stay (HOSPITAL_COMMUNITY)
Admission: EM | Admit: 2024-03-18 | Discharge: 2024-03-24 | DRG: 286 | Disposition: A | Discharge status: Skilled Nursing Facility | Attending: Internal Medicine | Admitting: Internal Medicine

## 2024-03-18 ENCOUNTER — Encounter (HOSPITAL_COMMUNITY): Payer: Self-pay

## 2024-03-18 DIAGNOSIS — I5043 Acute on chronic combined systolic (congestive) and diastolic (congestive) heart failure: Secondary | ICD-10-CM | POA: Diagnosis present

## 2024-03-18 DIAGNOSIS — I358 Other nonrheumatic aortic valve disorders: Secondary | ICD-10-CM | POA: Diagnosis present

## 2024-03-18 DIAGNOSIS — I2729 Other secondary pulmonary hypertension: Secondary | ICD-10-CM

## 2024-03-18 DIAGNOSIS — N1832 Chronic kidney disease, stage 3b: Secondary | ICD-10-CM | POA: Diagnosis present

## 2024-03-18 DIAGNOSIS — E871 Hypo-osmolality and hyponatremia: Secondary | ICD-10-CM | POA: Diagnosis present

## 2024-03-18 DIAGNOSIS — I4821 Permanent atrial fibrillation: Secondary | ICD-10-CM

## 2024-03-18 DIAGNOSIS — S0003XA Contusion of scalp, initial encounter: Secondary | ICD-10-CM | POA: Diagnosis present

## 2024-03-18 DIAGNOSIS — W19XXXA Unspecified fall, initial encounter: Secondary | ICD-10-CM | POA: Diagnosis present

## 2024-03-18 DIAGNOSIS — R262 Difficulty in walking, not elsewhere classified: Secondary | ICD-10-CM | POA: Diagnosis present

## 2024-03-18 DIAGNOSIS — Z7901 Long term (current) use of anticoagulants: Secondary | ICD-10-CM | POA: Diagnosis not present

## 2024-03-18 DIAGNOSIS — R600 Localized edema: Secondary | ICD-10-CM

## 2024-03-18 DIAGNOSIS — R0602 Shortness of breath: Secondary | ICD-10-CM | POA: Diagnosis present

## 2024-03-18 DIAGNOSIS — I428 Other cardiomyopathies: Secondary | ICD-10-CM | POA: Diagnosis present

## 2024-03-18 DIAGNOSIS — N179 Acute kidney failure, unspecified: Secondary | ICD-10-CM | POA: Diagnosis present

## 2024-03-18 DIAGNOSIS — I081 Rheumatic disorders of both mitral and tricuspid valves: Secondary | ICD-10-CM | POA: Diagnosis present

## 2024-03-18 DIAGNOSIS — R0600 Dyspnea, unspecified: Secondary | ICD-10-CM

## 2024-03-18 DIAGNOSIS — I130 Hypertensive heart and chronic kidney disease with heart failure and stage 1 through stage 4 chronic kidney disease, or unspecified chronic kidney disease: Secondary | ICD-10-CM | POA: Diagnosis present

## 2024-03-18 DIAGNOSIS — I2721 Secondary pulmonary arterial hypertension: Secondary | ICD-10-CM | POA: Diagnosis present

## 2024-03-18 DIAGNOSIS — Z79899 Other long term (current) drug therapy: Secondary | ICD-10-CM | POA: Diagnosis not present

## 2024-03-18 DIAGNOSIS — E1122 Type 2 diabetes mellitus with diabetic chronic kidney disease: Secondary | ICD-10-CM | POA: Diagnosis present

## 2024-03-18 DIAGNOSIS — I3139 Other pericardial effusion (noninflammatory): Secondary | ICD-10-CM | POA: Diagnosis present

## 2024-03-18 DIAGNOSIS — I5082 Biventricular heart failure: Secondary | ICD-10-CM | POA: Diagnosis present

## 2024-03-18 DIAGNOSIS — I50810 Right heart failure, unspecified: Secondary | ICD-10-CM | POA: Diagnosis not present

## 2024-03-18 DIAGNOSIS — I340 Nonrheumatic mitral (valve) insufficiency: Secondary | ICD-10-CM | POA: Diagnosis not present

## 2024-03-18 DIAGNOSIS — Z7984 Long term (current) use of oral hypoglycemic drugs: Secondary | ICD-10-CM | POA: Diagnosis not present

## 2024-03-18 DIAGNOSIS — D472 Monoclonal gammopathy: Secondary | ICD-10-CM | POA: Diagnosis present

## 2024-03-18 DIAGNOSIS — I371 Nonrheumatic pulmonary valve insufficiency: Secondary | ICD-10-CM | POA: Diagnosis present

## 2024-03-18 DIAGNOSIS — Z1152 Encounter for screening for COVID-19: Secondary | ICD-10-CM | POA: Diagnosis not present

## 2024-03-18 DIAGNOSIS — I361 Nonrheumatic tricuspid (valve) insufficiency: Secondary | ICD-10-CM | POA: Diagnosis not present

## 2024-03-18 LAB — BASIC METABOLIC PANEL WITH GFR
Anion gap: 11 (ref 5–15)
BUN: 31 mg/dL — ABNORMAL HIGH (ref 8–23)
CO2: 29 mmol/L (ref 22–32)
Calcium: 9 mg/dL (ref 8.9–10.3)
Chloride: 92 mmol/L — ABNORMAL LOW (ref 98–111)
Creatinine, Ser: 1.25 mg/dL — ABNORMAL HIGH (ref 0.44–1.00)
GFR, Estimated: 43 mL/min — ABNORMAL LOW
Glucose, Bld: 134 mg/dL — ABNORMAL HIGH (ref 70–99)
Potassium: 3.9 mmol/L (ref 3.5–5.1)
Sodium: 132 mmol/L — ABNORMAL LOW (ref 135–145)

## 2024-03-18 LAB — RESPIRATORY PANEL BY PCR

## 2024-03-18 LAB — CBC
HCT: 39.4 % (ref 36.0–46.0)
Hemoglobin: 13.1 g/dL (ref 12.0–15.0)
MCH: 32.4 pg (ref 26.0–34.0)
MCHC: 33.2 g/dL (ref 30.0–36.0)
MCV: 97.5 fL (ref 80.0–100.0)
Platelets: 216 K/uL (ref 150–400)
RBC: 4.04 MIL/uL (ref 3.87–5.11)
RDW: 14.1 % (ref 11.5–15.5)
WBC: 4.3 K/uL (ref 4.0–10.5)
nRBC: 0 % (ref 0.0–0.2)

## 2024-03-18 LAB — I-STAT VENOUS BLOOD GAS, ED
Acid-Base Excess: 9 mmol/L — ABNORMAL HIGH (ref 0.0–2.0)
Bicarbonate: 35.7 mmol/L — ABNORMAL HIGH (ref 20.0–28.0)
Calcium, Ion: 1.09 mmol/L — ABNORMAL LOW (ref 1.15–1.40)
HCT: 42 % (ref 36.0–46.0)
Hemoglobin: 14.3 g/dL (ref 12.0–15.0)
O2 Saturation: 50 %
Potassium: 3.6 mmol/L (ref 3.5–5.1)
Sodium: 137 mmol/L (ref 135–145)
TCO2: 37 mmol/L — ABNORMAL HIGH (ref 22–32)
pCO2, Ven: 57.2 mmHg (ref 44–60)
pH, Ven: 7.403 (ref 7.25–7.43)
pO2, Ven: 28 mmHg — CL (ref 32–45)

## 2024-03-18 LAB — TROPONIN T, HIGH SENSITIVITY
Troponin T High Sensitivity: 41 ng/L — ABNORMAL HIGH (ref 0–19)
Troponin T High Sensitivity: 43 ng/L — ABNORMAL HIGH (ref 0–19)

## 2024-03-18 LAB — PRO BRAIN NATRIURETIC PEPTIDE: Pro Brain Natriuretic Peptide: 6184 pg/mL — ABNORMAL HIGH

## 2024-03-18 LAB — RESP PANEL BY RT-PCR (RSV, FLU A&B, COVID)  RVPGX2
Influenza A by PCR: NEGATIVE
Influenza B by PCR: NEGATIVE
Resp Syncytial Virus by PCR: NEGATIVE
SARS Coronavirus 2 by RT PCR: NEGATIVE

## 2024-03-18 LAB — PROCALCITONIN: Procalcitonin: <0.1 ng/mL

## 2024-03-18 LAB — MAGNESIUM: Magnesium: 2.1 mg/dL (ref 1.7–2.4)

## 2024-03-18 MED ORDER — CARVEDILOL 12.5 MG PO TABS: 25.0000 mg | ORAL_TABLET | Freq: Two times a day (BID) | ORAL | Status: DC

## 2024-03-18 MED ORDER — SODIUM CHLORIDE 0.9% FLUSH: 3.0000 mL | INTRAVENOUS | Status: DC | PRN

## 2024-03-18 MED ORDER — SODIUM CHLORIDE 0.9% FLUSH
3.0000 mL | Freq: Two times a day (BID) | INTRAVENOUS | Status: DC
  Administered 2024-03-19 – 2024-03-24 (×12): 3 mL via INTRAVENOUS

## 2024-03-18 MED ORDER — HYDRALAZINE HCL 20 MG/ML IJ SOLN: 5.0000 mg | INTRAMUSCULAR | Status: DC | PRN

## 2024-03-18 MED ORDER — SODIUM CHLORIDE 0.9% FLUSH
3.0000 mL | Freq: Two times a day (BID) | INTRAVENOUS | Status: DC
  Administered 2024-03-19: 3 mL via INTRAVENOUS

## 2024-03-18 MED ORDER — ACETAMINOPHEN 325 MG PO TABS: 650.0000 mg | ORAL_TABLET | Freq: Four times a day (QID) | ORAL | Status: DC | PRN

## 2024-03-18 MED ORDER — BRIMONIDINE TARTRATE 0.2 % OP SOLN
1.0000 [drp] | Freq: Two times a day (BID) | OPHTHALMIC | Status: DC
  Administered 2024-03-19 – 2024-03-20 (×4): 1 [drp] via OPHTHALMIC
  Filled 2024-03-18: qty 5

## 2024-03-18 MED ORDER — ACETAMINOPHEN 650 MG RE SUPP: 650.0000 mg | Freq: Four times a day (QID) | RECTAL | Status: DC | PRN

## 2024-03-18 MED ORDER — SODIUM CHLORIDE 0.9 % IV SOLN: 250.0000 mL | INTRAVENOUS | Status: DC | PRN

## 2024-03-18 MED ORDER — APIXABAN 5 MG PO TABS
5.0000 mg | ORAL_TABLET | Freq: Two times a day (BID) | ORAL | Status: DC
  Administered 2024-03-19 – 2024-03-24 (×11): 5 mg via ORAL
  Filled 2024-03-18 (×12): qty 1

## 2024-03-18 MED ORDER — POLYETHYLENE GLYCOL 3350 17 G PO PACK: 17.0000 g | PACK | Freq: Every day | ORAL | Status: DC | PRN

## 2024-03-18 MED ORDER — FUROSEMIDE 10 MG/ML IJ SOLN
60.0000 mg | Freq: Once | INTRAMUSCULAR | Status: AC
  Administered 2024-03-18: 60 mg via INTRAVENOUS
  Filled 2024-03-18: qty 6

## 2024-03-18 MED ORDER — APIXABAN 5 MG PO TABS
5.0000 mg | ORAL_TABLET | Freq: Two times a day (BID) | ORAL | Status: DC
  Administered 2024-03-18: 5 mg via ORAL
  Filled 2024-03-18: qty 1

## 2024-03-18 MED ORDER — FUROSEMIDE 10 MG/ML IJ SOLN
40.0000 mg | Freq: Two times a day (BID) | INTRAMUSCULAR | Status: DC
  Administered 2024-03-19 (×3): 40 mg via INTRAVENOUS
  Filled 2024-03-18 (×3): qty 4

## 2024-03-18 NOTE — ED Notes (Signed)
 Attempted to get another EKG with a different portable EKG machine that is more clear and has less artifact. Attempt was not successful, EKG still has much artifact and movement

## 2024-03-18 NOTE — ED Provider Notes (Signed)
 " [facility] AT [facility] Provider Note   CSN: [id number] Arrival date & time: 03/17/24  2356     History  Chief Complaint  Patient presents with   Shortness of Breath   Leg Swelling    [name] Long is a 84 y.o. female with PMH as listed below who presents with SOB and BLE +4 pitting edema, weeping x 2 weeks that just keeps getting worse.  Accompanied by her family at bedside.  She takes 80 mg Lasix  daily at home and has not missed any doses.  She denies fever/chills, chest pain.  Daughter later reports that she had a fall last week in [place] when she struck her left forehead because she was dizzy.  ED eval and seen car was negative but unk here exactly what they did at that time.  Patient does take Eliquis  for A-fib.   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 Authorizing Provider  apixaban  (ELIQUIS ) 5 MG TABS tablet Take 5 mg by mouth 2 (two) times daily.    [provider]  brimonidine  (ALPHAGAN ) 0.2 % ophthalmic solution Place 1 drop into both eyes 2 (two) times daily.    [provider]  carvedilol  (COREG ) 25 MG tablet Take 25 mg by mouth 2 (two) times daily with a meal.    [provider]  ENTRESTO  97-103 MG Take 1 tablet by mouth 2 (two) times daily. 05/03/20   [provider]  furosemide  (LASIX ) 80 MG tablet Take 1 tablet (80 mg total) by mouth daily. 09/02/17   Bryn Bernardino NOVAK, MD  isosorbide -hydrALAZINE  (BIDIL ) 20-37.5 MG tablet Take 1 tablet by mouth 3 (three) times daily. 09/04/22   Wonda Sharper, MD  JARDIANCE  10  MG TABS tablet Take 10 mg by mouth in the morning. 07/20/20   [provider]  latanoprost  (XALATAN ) 0.005 % ophthalmic solution Place 1 drop into both eyes at bedtime.    [provider]      Allergies    Patient has no known allergies.    Review of Systems   Review of Systems A 10 point review of systems was performed and is negative unless otherwise reported in HPI.  Physical Exam Updated Vital Signs BP (!) 150/77 (BP Location: Right Arm)   Pulse (!) 50   Temp (!) 97.5 F (36.4 C)   Resp 16   Ht 5' 4 (1.626 m)   Wt 73 kg   SpO2 98%   BMI 27.62 kg/m  Physical Exam General: Normal appearing female, lying in bed.  HEENT: Small hematoma to left forehead.  PERRLA, Sclera anicteric, MMM, trachea midline.  Cardiology: RRR, no murmurs/rubs/gallops Resp: Mildly increased respiratory rate into the 20s. CTAB, no wheezes, rhonchi, crackles.  Abd: Soft, non-tender, non-distended. No rebound tenderness or guarding.  GU: Deferred. MSK: 4+ pitting edema in bilateral lower extremities up to the hip.  Weeping in bilateral lower extremities with a bullae on the left anterior lower extremity full of fluid.  Intact distal pulses  and cap refill.  No signs of trauma. Extremities without deformity or TTP. No cyanosis or clubbing. Skin: warm, dry. Neuro: A&Ox4, CNs II-XII grossly intact. MAEs. Sensation grossly intact.  Psych: Normal mood and affect.   ED Results / Procedures / Treatments   Labs (all labs ordered are listed, but only abnormal results are displayed) Labs Reviewed  BASIC METABOLIC PANEL WITH GFR - Abnormal; Notable for the following components:      Result Value   Sodium 132 (*)    Chloride 92 (*)    Glucose, Bld 134 (*)    BUN 31 (*)    Creatinine, Ser 1.25 (*)    GFR, Estimated 43 (*)    All other components within normal limits  PRO BRAIN NATRIURETIC PEPTIDE - Abnormal; Notable for the following components:   Pro Brain Natriuretic Peptide 6,184.0 (*)     All other components within normal limits  CBC    EKG EKG Interpretation Date/Time:  Tuesday March 18 2024 00:25:59 EST Ventricular Rate:  59 PR Interval:    QRS Duration:  82 QT Interval:  444 QTC Calculation: 439 R Axis:   256  Text Interpretation: ** Suspect arm lead reversal, interpretation assumes no reversal Atrial fibrillation with slow ventricular response Right ventricular hypertrophy Septal infarct , age undetermined Possible Lateral infarct , age undetermined Inferior infarct , age undetermined Confirmed by Franklyn Gills [phone]) on 03/18/2024 8:17:02 AM  Radiology DG Chest 2 View Result Date: 03/18/2024 EXAM: 2 VIEW(S) XRAY OF THE CHEST 03/18/2024 01:16:00 AM COMPARISON: 08/10/2021 CLINICAL HISTORY: sob FINDINGS: LUNGS AND PLEURA: No focal pulmonary opacity. No pleural effusion. No pneumothorax. HEART AND MEDIASTINUM: Unchanged cardiomediastinal silhouette. Atherosclerotic plaque. BONES AND SOFT TISSUES: Thoracic degenerative changes. Bilateral acromioclavicular joint degenerative changes. IMPRESSION: 1. No acute cardiopulmonary findings. Electronically signed by: Morgane Naveau MD MD 03/18/2024 01:18 AM EST RP Workstation: [id number]    Procedures Procedures    Medications Ordered in ED Medications  furosemide  (LASIX ) injection 60 mg (60 mg Intravenous Given 03/18/24 1012)    ED Course/ Medical Decision Making/ A&P                          Medical Decision Making Amount and/or Complexity of Data Reviewed Labs: ordered. Radiology: ordered.  Risk Prescription drug management. Decision regarding hospitalization.    This patient presents to the ED for concern of increased lower extremity Dema and volume overload, this involves an extensive number of treatment options, and is a complaint that carries with it a high risk of complications and morbidity.  I considered the following differential and admission for this acute, potentially life threatening condition.    MDM:    Patient with increased lower extremity edema and difficulty with ambulation due to the edema and pain in the legs.  Her edema is so severe she is weeping in bilateral lower extremities.  She is mildly dyspneic and mildly increased work of breathing though not in any respiratory distress and no hypoxia.  Likely had acute on chronic heart failure exacerbation.  Daughter states that she did her head a few days ago and is unclear what workup was done in [place]. [place].  Obtained a CT head as patient is on Eliquis  which was reassuringly negative.  Patient's chest x-ray does not show any pneumonia or overt pulmonary edema though with her increased lower extremity edema she clearly has volume overload and an elevated proBNP greater than 6000.  I do suspect probably some  component of pulmonary edema that is not visible on the chest x-ray given her clinical status.  She has difficulty ambulating due to the lower extremity edema and I believe the patient could use inpatient diuresis.  60 mg IV Lasix  given in the ED.  Clinical Course as of 03/22/24 0949  Tue Mar 18, 2024  1122 Daughter now reports that patient fell and hit her head a few days ago. Does take eliquis . Will get CTH.  [HN]    Clinical Course User Index [HN] Franklyn Sid SAILOR, MD    Labs: I Ordered, and personally interpreted labs.  The pertinent results include:  those listed above  Imaging Studies ordered: CXR ordered from triage I independently visualized and interpreted imaging. I agree with the radiologist interpretation  Additional history obtained from chart review.    Cardiac Monitoring: The patient was maintained on a cardiac monitor.  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   I have reviewed the patients home medicines and have made adjustments as needed  Problem List / ED Course: Problem List Items Addressed This Visit   None Visit Diagnoses       Acute on chronic combined systolic and diastolic congestive heart failure (HCC)    -  Primary   Relevant Medications   furosemide  (LASIX ) injection 60 mg (Completed)   apixaban  (ELIQUIS ) tablet 5 mg   sacubitril -valsartan  (ENTRESTO ) 24-26 mg per tablet   furosemide  (LASIX ) injection 80 mg (Completed)   furosemide  (LASIX ) injection 80 mg     Peripheral edema         Dyspnea, unspecified type                       This note was created using dictation software, which may contain spelling or grammatical errors.    Franklyn Sid SAILOR, MD 03/22/24 [phone]  "

## 2024-03-18 NOTE — ED Notes (Signed)
 Pt ambulated in hallways with a walker.  O2 sats remained 98-100% on RA. Pt did not appear SHOB and states she felt fine during the walk.

## 2024-03-18 NOTE — ED Notes (Signed)
 CCMD called.

## 2024-03-18 NOTE — Consult Note (Signed)
 "  Cardiology Consultation   Patient ID: [name] Long MRN: [id number]; DOB: 06-18-1940  Admit date: 03/18/2024 Date of Consult: 03/18/2024  PCP:  Patient, No Pcp Per   [facility] Providers Cardiologist:  Ozell Fell, MD     Patient Profile: [name] Long is a 84 y.o. 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 Tobie.  History of Present Illness: [name] Long is an 84 year old female with past medical history noted above.  She is followed by Dr. Fell as an outpatient.  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  Cardiac catheterization in 2022 with patent coronary arteries with mild diffuse plaque, no significant pulmonary hypertension, PA pressure 50/11 mean 25, transpulmonary gradient 12 mmHg, PVR 1.6 Wood units. Echocardiogram in 09/2022 showed normal LV function with severe biatrial enlargement, mild to moderate MR, severe pulmonic valve regurgitation and moderate to severe tricuspid regurgitation.  She travels between [place]. [place] and [place].   Last seen in the office 12/2023 and reported doing well from a cardiac standpoint.  She was continued on Entresto , carvedilol , furosemide  and Jardiance .  Recommended for updated echocardiogram was completed 12/31/2023 with LVEF of 60 to 65%, asymmetric LVH of the basal septal segment, moderately reduced/enlarged RV, severe PASP of 75 mmHg, severe biatrial enlargement, moderate mitral annular calcification with mild MR, severe TR.   Presented to the ED on 1/13 with complaints of dizziness and lower extremity edema for the past several weeks. Says she was dizzy and fell last month hitting her head, left a knot on her forehead. Also report she has had increased swelling in her LE extremities the past 2  weeks. Has been compliant with her home meds, including lasix  and eliquis .   Labs in the ED showed sodium 132, potassium 3.9, creatinine 1.25, proBNP 6184, WBC 4.3, hemoglobin 13.1.  CT head negative for acute findings.  EKG shows rate controlled atrial fibrillation, 59 bpm at baseline artifact.  Chest x-ray negative.  CT chest with cardiomegaly and small pericardial effusion. Admitted to internal medicine for further management.  Cardiology asked to evaluate.  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    DM (diabetes mellitus) (HCC)    Dyspnea    Hypertension    Lymphedema    MGUS (monoclonal gammopathy of unknown significance)    Mitral regurgitation    Pedal edema    PVC (premature ventricular contraction)     Past Surgical History:  Procedure Laterality Date   CESAREAN SECTION     RIGHT/LEFT HEART CATH AND CORONARY ANGIOGRAPHY N/A 10/13/2020   Procedure: RIGHT/LEFT HEART CATH AND CORONARY ANGIOGRAPHY;  Surgeon: Fell Ozell, MD;  [facility] INVASIVE CV LAB;  Service: Cardiovascular;  Laterality: N/A;    Scheduled Meds:  Continuous Infusions:  PRN Meds:   Allergies:   Allergies[1]  Social History:   Social History   Socioeconomic History   Marital status: Widowed    Spouse name: Not on file   Number of children: Not on file   Years of education: Not on file   Highest education level: Not on file  Occupational History   Not on file  Tobacco Use   Smoking status: Never   Smokeless tobacco: Never  Substance  and Sexual Activity   Alcohol use: Never   Drug use: Never   Sexual activity: Not on file  Other Topics Concern   Not on file  Social History Narrative   Not on file   Social Drivers of Health   Tobacco Use: Low Risk (03/18/2024)   Patient History    Smoking Tobacco Use: Never    Smokeless Tobacco Use: Never    Passive Exposure: Not on file  Financial Resource Strain:  Not on file  Food Insecurity: Not on file  Transportation Needs: Not on file  Physical Activity: Not on file  Stress: Not on file  Social Connections: Not on file  Intimate Partner Violence: Not on file  Depression (EYV7-0): Not on file  Alcohol Screen: Not on file  Housing: Not on file  Utilities: Not on file  Health Literacy: Not on file    Family History:   History reviewed. No pertinent family history.   ROS:  Please see the history of present illness.   All other ROS reviewed and negative.     Physical Exam/Data: Vitals:   03/18/24 0818 03/18/24 1200 03/18/24 1249 03/18/24 1345  BP: (!) 166/76 (!) 170/73  (!) 164/68  Pulse: 62 (!) 33  63  Resp: 16 15  14   Temp: (!) 97.5 F (36.4 C)  (!) 97.5 F (36.4 C)   TempSrc: Oral  Oral   SpO2: 100% 100%  100%  Weight:      Height:        Intake/Output Summary (Last 24 hours) at 03/18/2024 1556 Last data filed at 03/18/2024 1248 Gross per 24 hour  Intake --  Output 600 ml  Net -600 ml      03/18/2024   12:55 AM 12/11/2023    9:00 AM 09/04/2022    9:37 AM  Last 3 Weights  Weight (lbs) 160 lb 15 oz 159 lb 9.6 oz 162 lb 6.4 oz  Weight (kg) 73 kg 72.394 kg 73.664 kg     Body mass index is 27.62 kg/m.  General:  Well nourished, well developed, in no acute distress HEENT: normal Neck: no JVD Vascular: No carotid bruits; Distal pulses 2+ bilaterally Cardiac:  normal S1, S2; RRR; no murmur  Lungs:  clear to auscultation bilaterally, no wheezing, rhonchi or rales  Abd: soft, nontender, no hepatomegaly  Ext: no edema Musculoskeletal:  No deformities. With chronic venous insuffiencey + 2-3 LE edema  Skin: warm and dry  Neuro: no focal abnormalities noted Psych:  Normal affect   EKG:  The EKG was personally reviewed and demonstrates:  atrial fibrillation, 59 bpm Telemetry:  Telemetry was personally reviewed and demonstrates:  atrial fibrillation, PVCs  Relevant CV Studies:  Echo: 12/2023  IMPRESSIONS     1. Left  ventricular ejection fraction, by estimation, is 60 to 65%. The  left ventricle has normal function. The left ventricle has no regional  wall motion abnormalities. There is mild asymmetric left ventricular  hypertrophy of the basal-septal segment.  Left ventricular diastolic parameters are indeterminate.   2. Right ventricular systolic function is mildly reduced. The right  ventricular size is mildly enlarged. There is severely elevated pulmonary  artery systolic pressure. The estimated right ventricular systolic  pressure is 75.5 mmHg.   3. Left atrial size was severely dilated.   4. Right atrial size was moderately dilated.   5. The mitral valve is degenerative. Mild mitral valve regurgitation. No  evidence of mitral stenosis. Moderate mitral annular calcification.   6.  Systolic flow reversal noted in hepatic vein doppler pattern. The  tricuspid valve is abnormal. Tricuspid valve regurgitation is severe.   7. The aortic valve is tricuspid. There is mild calcification of the  aortic valve. Aortic valve regurgitation is not visualized. No aortic  stenosis is present.   8. The inferior vena cava is dilated in size with <50% respiratory  variability, suggesting right atrial pressure of 15 mmHg.   9. The patient was in atrial fibrillation.   FINDINGS   Left Ventricle: Left ventricular ejection fraction, by estimation, is 60  to 65%. The left ventricle has normal function. The left ventricle has no  regional wall motion abnormalities. The left ventricular internal cavity  size was normal in size. There is   mild asymmetric left ventricular hypertrophy of the basal-septal segment.  Left ventricular diastolic parameters are indeterminate.   Right Ventricle: The right ventricular size is mildly enlarged. No  increase in right ventricular wall thickness. Right ventricular systolic  function is mildly reduced. There is severely elevated pulmonary artery  systolic pressure. The tricuspid   regurgitant velocity is 3.89 m/s, and with an assumed right atrial  pressure of 15 mmHg, the estimated right ventricular systolic pressure is  75.5 mmHg.   Left Atrium: Left atrial size was severely dilated.   Right Atrium: Right atrial size was moderately dilated.   Pericardium: There is no evidence of pericardial effusion.   Mitral Valve: The mitral valve is degenerative in appearance. There is  mild calcification of the mitral valve leaflet(s). Moderate mitral annular  calcification. Mild mitral valve regurgitation. No evidence of mitral  valve stenosis.   Tricuspid Valve: Systolic flow reversal noted in hepatic vein doppler  pattern. The tricuspid valve is abnormal. Tricuspid valve regurgitation is  severe.   Aortic Valve: The aortic valve is tricuspid. There is mild calcification  of the aortic valve. Aortic valve regurgitation is not visualized. No  aortic stenosis is present.   Pulmonic Valve: The pulmonic valve was normal in structure. Pulmonic valve  regurgitation is mild.   Aorta: The aortic root is normal in size and structure.   Venous: The inferior vena cava is dilated in size with less than 50%  respiratory variability, suggesting right atrial pressure of 15 mmHg.   IAS/Shunts: No atrial level shunt detected by color flow Doppler.    Laboratory Data: High Sensitivity Troponin:  No results for input(s): TROPONINIHS in the last 720 hours. No results for input(s): TRNPT in the last 720 hours.    Chemistry Recent Labs  Lab 03/18/24 0107 03/18/24 1421  NA 132* 137  K 3.9 3.6  CL 92*  --   CO2 29  --   GLUCOSE 134*  --   BUN 31*  --   CREATININE 1.25*  --   CALCIUM  9.0  --   GFRNONAA 43*  --   ANIONGAP 11  --     No results for input(s): PROT, ALBUMIN , AST, ALT, ALKPHOS, BILITOT in the last 168 hours. Lipids No results for input(s): CHOL, TRIG, HDL, LABVLDL, LDLCALC, CHOLHDL in the last 168 hours.  Hematology Recent Labs   Lab 03/18/24 0107 03/18/24 1421  WBC 4.3  --   RBC 4.04  --   HGB 13.1 14.3  HCT 39.4 42.0  MCV 97.5  --   MCH 32.4  --   MCHC 33.2  --   RDW 14.1  --   PLT 216  --    Thyroid  No results for input(s): TSH,  FREET4 in the last 168 hours.  BNP Recent Labs  Lab 03/18/24 0107  PROBNP 6,184.0*    DDimer No results for input(s): DDIMER in the last 168 hours.  Radiology/Studies:  CT Head Wo Contrast Result Date: 03/18/2024 CLINICAL DATA:  Head trauma, minor (Age >= 65y) EXAM: CT HEAD WITHOUT CONTRAST TECHNIQUE: Contiguous axial images were obtained from the base of the skull through the vertex without intravenous contrast. RADIATION DOSE REDUCTION: This exam was performed according to the departmental dose-optimization program which includes automated exposure control, adjustment of the mA and/or kV according to patient size and/or use of iterative reconstruction technique. COMPARISON:  None Available. FINDINGS: Brain: Diffuse brain atrophy pattern and periventricular white matter microvascular ischemic changes. No acute intracranial hemorrhage, new infarction, mass lesion, midline shift, herniation, hydrocephalus, or extra-axial fluid collection. No focal mass effect or edema. Cisterns are patent. Cerebellar atrophy as well. Vascular: No hyperdense vessel or unexpected calcification. Skull: Normal. Negative for fracture or focal lesion. Sinuses/Orbits: No acute finding. Other: Left anterior frontal small hyperdense scalp hematoma noted superficially. IMPRESSION: 1. Brain atrophy and white matter microvascular ischemic changes. 2. No acute intracranial abnormality by noncontrast CT. 3. Small left anterior frontal scalp hematoma. Electronically Signed   By: CHRISTELLA.  Shick M.D.   On: 03/18/2024 12:33   DG Chest 2 View Result Date: 03/18/2024 EXAM: 2 VIEW(S) XRAY OF THE CHEST 03/18/2024 01:16:00 AM COMPARISON: 08/10/2021 CLINICAL HISTORY: sob FINDINGS: LUNGS AND PLEURA: No focal pulmonary opacity.  No pleural effusion. No pneumothorax. HEART AND MEDIASTINUM: Unchanged cardiomediastinal silhouette. Atherosclerotic plaque. BONES AND SOFT TISSUES: Thoracic degenerative changes. Bilateral acromioclavicular joint degenerative changes. IMPRESSION: 1. No acute cardiopulmonary findings. Electronically signed by: Morgane Naveau MD MD 03/18/2024 01:18 AM EST RP Workstation: [id number]     Assessment and Plan:  [name] Long is a 84 y.o. 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 Tobie.  Dizziness Fall -- reports episodes of dizziness and a fall last month when she hit her forehead. Says when she turns her head to the right symptoms are worse. 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  CT chest with cardiomegaly and small pericardial effusion. She does not appear significantly fluid overloaded on exam -- s/p IV lasix  60mg  x1 in the ED -- will review further with MD for consideration of possible RHC to reevaluate right heart pressures. Though suspect she would be a poor candidate for intervention. Question whether worsening pulmonary HTN is contributing to her dizziness? -- GDMT: on coreg  25mg  BID, Entresto  97-103mg  BID, jardiance , BiDil  20-37.5mg  TID and lasix  80mg  daily   Permanent atrial fibrillation -- Rate controlled atrial fibrillation on admission -- Continue home Eliquis   LE edema Chronic venous insuff -- denies any weeping to LE, does have a fluid filled blister to the LLE -- has used unna boots in the past   Risk Assessment/Risk Scores:      New [place]  Heart Association (NYHA) Functional Class NYHA Class II  CHA2DS2-VASc Score = 5    This indicates a 7.2% annual risk of stroke. The patient's score is based upon: CHF History: 1 HTN History: 1 Diabetes History: 0 Stroke History: 0 Vascular Disease History: 0 Age Score: 2 Gender  Score: 1       For questions or updates, please contact [facility] Please consult [email or web address] for contact info under       Signed, Manuelita Rummer, NP  03/18/2024 3:56 PM     [1] No Known Allergies  "

## 2024-03-18 NOTE — ED Notes (Signed)
 Vbg results to jasmine h.rn by at

## 2024-03-18 NOTE — TOC CM/SW Note (Signed)
 TOC consult received for d/c planning needs. Follow-up to be completed with patient as appropriate.   Merilee Batty, MSN, RN Case Management [phone]

## 2024-03-18 NOTE — H&P (Signed)
 " History and Physical    Patient: [name] Long FMW:[id number] DOB: 05-Apr-1940 DOA: 03/18/2024 DOS: the patient was seen and examined on 03/18/2024 . PCP: Patient, No Pcp Per  Patient coming from: Home Chief complaint: Chief Complaint  Patient presents with   Shortness of Breath   Leg Swelling   HPI:  [name] Long is a 84 y.o. 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 She travels between her home in [place] and [place] where her daughter lives.  Patient's most recent 2D echocardiogram was in 2024 October showing normal left and right ventricular function with severe biatrial enlargement mild to moderate mitral regurg and moderate to severe TR.  PTA meds include Eliquis  5 for her A-fib, Coreg  25 twice daily, Entresto  97-103 twice daily, Lasix  80 daily, Imdur 20-37.5, Jardiance , latanoprost .  Per cardiology note patient's last cardiac cath was in 2022 August showed patent coronary arteries with mild diffuse plaquing and no significant stenosis no significant pulmonary hypertension.   ED Course:  Vital signs in the ED were notable for the following:  Vitals:   03/18/24 1345 03/18/24 1445 03/18/24 1700 03/18/24 1912  BP: (!) 164/68 132/82 (!) 102/52 (!) 153/115  Pulse: 63 73 (!) 55 (!) 59  Temp:   97.6 F (36.4 C) 97.7 F (36.5 C)  Resp: 14 15 14 20   Height:      Weight:      SpO2: 100% 100% 100% 100%  TempSrc:   Oral Oral  BMI (Calculated):       >>ED evaluation thus far shows: BMP shows sodium 132, glucose of 134 and BUN 31 , Creatinine of 1.25 /gfr of 43.  Probnp of 6184.  Procalcitonin <0.10. Normal CBC. Head  Ct negative.  Chest xray no acute cardiopulmonary findings. CT chest : cardiomegaly and small pericardial effusion.  EKG: A.fib and poor EKG we will repeat.   >>While in the ED patient received the following: Medications  furosemide  (LASIX ) injection 60 mg (60 mg Intravenous Given 03/18/24 1012)   Review of Systems  Respiratory:  Positive for shortness of breath.   Cardiovascular:  Positive for leg swelling.  Neurological:  Positive for dizziness.   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    DM (diabetes mellitus) (HCC)    Dyspnea    Hypertension    Lymphedema    MGUS (monoclonal gammopathy of unknown significance)    Mitral regurgitation    Pedal edema    PVC (premature ventricular contraction)    Past Surgical History:  Procedure Laterality Date   CESAREAN SECTION     RIGHT/LEFT HEART CATH AND CORONARY ANGIOGRAPHY N/A 10/13/2020   Procedure: RIGHT/LEFT HEART CATH AND CORONARY ANGIOGRAPHY;  Surgeon: Wonda Sharper, MD;  Location: [facility] INVASIVE CV LAB;  Service: Cardiovascular;  Laterality: N/A;    reports that she has never smoked. She has never used smokeless tobacco. She reports that she does not drink alcohol and does not use drugs. Allergies[1] History reviewed. No pertinent family history. Prior to Admission medications  Medication Sig Start Date End Date Taking?  Authorizing Provider  apixaban  (ELIQUIS ) 5 MG TABS tablet Take 5 mg by mouth 2 (two) times daily.    [provider]  brimonidine  (ALPHAGAN ) 0.2 % ophthalmic solution Place 1 drop into both eyes 2 (two) times daily.    [provider]  carvedilol  (COREG ) 25 MG tablet Take 25 mg by mouth 2 (two) times daily with a meal.    [provider]  ENTRESTO  97-103 MG Take 1 tablet by mouth 2 (two) times daily. 05/03/20   [provider]  furosemide  (LASIX ) 80 MG tablet Take 1 tablet (80 mg total) by mouth  daily. 09/02/17   Bryn Bernardino NOVAK, MD  isosorbide -hydrALAZINE  (BIDIL ) 20-37.5 MG tablet Take 1 tablet by mouth 3 (three) times daily. 09/04/22   Wonda Sharper, MD  JARDIANCE  10 MG TABS tablet Take 10 mg by mouth in the morning. 07/20/20   [provider]  latanoprost  (XALATAN ) 0.005 % ophthalmic solution Place 1 drop into both eyes at bedtime.    [provider]                                                                                 Vitals:   03/18/24 1345 03/18/24 1445 03/18/24 1700 03/18/24 1912  BP: (!) 164/68 132/82 (!) 102/52 (!) 153/115  Pulse: 63 73 (!) 55 (!) 59  Resp: 14 15 14 20   Temp:   97.6 F (36.4 C) 97.7 F (36.5 C)  TempSrc:   Oral Oral  SpO2: 100% 100% 100% 100%  Weight:      Height:       Physical Exam Vitals reviewed.  Constitutional:      General: She is not in acute distress.    Appearance: She is not ill-appearing.  HENT:     Head: Normocephalic.  Eyes:     Extraocular Movements: Extraocular movements intact.  Cardiovascular:     Rate and Rhythm: Normal rate. Rhythm irregular.     Heart sounds: Normal heart sounds.  Pulmonary:     Breath sounds: Normal breath sounds.  Abdominal:     General: There is no distension.     Palpations: Abdomen is soft.     Tenderness: There is no abdominal tenderness.  Musculoskeletal:     Right lower leg: 2+ Edema present.     Left lower leg: 2+ Edema present.  Neurological:     General: No focal deficit present.     Mental Status: She is alert and oriented to person, place, and time.     Labs on Admission: I have personally reviewed following labs and imaging studies CBC: Recent Labs  Lab 03/18/24 0107 03/18/24 1421  WBC 4.3  --   HGB 13.1 14.3  HCT 39.4 42.0  MCV 97.5  --   PLT 216  --    Basic Metabolic Panel: Recent Labs  Lab 03/18/24 0107 03/18/24 1421 03/18/24 1619  NA 132* 137  --   K 3.9 3.6  --   CL 92*  --   --   CO2 29  --   --   GLUCOSE 134*  --   --   BUN 31*  --    --  CREATININE 1.25*  --   --   CALCIUM  9.0  --   --   MG  --   --  2.1   GFR: Estimated Creatinine Clearance: 33.4 mL/min (A) (by C-G formula based on SCr of 1.25 mg/dL (H)). Liver Function Tests: No results for input(s): AST, ALT, ALKPHOS, BILITOT, PROT, ALBUMIN  in the last 168 hours. No results for input(s): LIPASE, AMYLASE in the last 168 hours. No results for input(s): AMMONIA in the last 168 hours. Recent Labs    12/11/23 1015 12/31/23 1258 03/18/24 0107  BUN 24 31* 31*  CREATININE 1.19* 1.20* 1.25*  Cardiac Enzymes: No results for input(s): CKTOTAL, CKMB, CKMBINDEX, TROPONINI in the last 168 hours. BNP (last 3 results) Recent Labs    03/18/24 0107  PROBNP 6,184.0*  Urine analysis:    Component Value Date/Time   COLORURINE STRAW (A) 08/28/2017 0840   APPEARANCEUR CLEAR 08/28/2017 0840   LABSPEC 1.004 (L) 08/28/2017 0840   PHURINE 7.0 08/28/2017 0840   GLUCOSEU NEGATIVE 08/28/2017 0840   HGBUR LARGE (A) 08/28/2017 0840   BILIRUBINUR NEGATIVE 08/28/2017 0840   KETONESUR NEGATIVE 08/28/2017 0840   PROTEINUR NEGATIVE 08/28/2017 0840   NITRITE NEGATIVE 08/28/2017 0840   LEUKOCYTESUR NEGATIVE 08/28/2017 0840   Radiological Exams on Admission: CT Chest Wo Contrast Result Date: 03/18/2024 CLINICAL DATA:  Shortness of breath. EXAM: CT CHEST WITHOUT CONTRAST TECHNIQUE: Multidetector CT imaging of the chest was performed following the standard protocol without IV contrast. RADIATION DOSE REDUCTION: This exam was performed according to the departmental dose-optimization program which includes automated exposure control, adjustment of the mA and/or kV according to patient size and/or use of iterative reconstruction technique. COMPARISON:  None Available. FINDINGS: Evaluation of this exam is limited in the absence of intravenous contrast. Cardiovascular: There is cardiomegaly. Small pericardial effusion. There is coronary vascular calcification. Moderate  atherosclerotic calcification of the thoracic aorta. Lungs 1 dilatation. The central pulmonary arteries are grossly unremarkable. Mediastinum/Nodes: No hilar or mediastinal adenopathy. Evaluation however is limited in the absence of intravenous contrast. The esophagus is grossly unremarkable. No mediastinal fluid collection. Lungs/Pleura: No focal consolidation, pleural effusion, pneumothorax. Small lung nodules or nodular scarring in the anterior right middle lobe measure up to 4 mm and present on the prior CT. The central airways are patent. Upper Abdomen: No acute abnormality. Musculoskeletal: Diffuse subcutaneous edema and anasarca. Osteopenia with degenerative changes of the spine. No acute osseous pathology. IMPRESSION: 1. No acute intrathoracic pathology. 2. Cardiomegaly with small pericardial effusion. 3.  Aortic Atherosclerosis (ICD10-I70.0). Electronically Signed   By: Vanetta Chou M.D.   On: 03/18/2024 16:09   CT Head Wo Contrast Result Date: 03/18/2024 CLINICAL DATA:  Head trauma, minor (Age >= 65y) EXAM: CT HEAD WITHOUT CONTRAST TECHNIQUE: Contiguous axial images were obtained from the base of the skull through the vertex without intravenous contrast. RADIATION DOSE REDUCTION: This exam was performed according to the departmental dose-optimization program which includes automated exposure control, adjustment of the mA and/or kV according to patient size and/or use of iterative reconstruction technique. COMPARISON:  None Available. FINDINGS: Brain: Diffuse brain atrophy pattern and periventricular white matter microvascular ischemic changes. No acute intracranial hemorrhage, new infarction, mass lesion, midline shift, herniation, hydrocephalus, or extra-axial fluid collection. No focal mass effect or edema. Cisterns are patent. Cerebellar atrophy as well. Vascular: No hyperdense vessel or unexpected calcification. Skull: Normal. Negative for fracture or focal lesion. Sinuses/Orbits: No acute  finding. Other: Left anterior frontal small hyperdense scalp hematoma noted superficially. IMPRESSION: 1.  Brain atrophy and white matter microvascular ischemic changes. 2. No acute intracranial abnormality by noncontrast CT. 3. Small left anterior frontal scalp hematoma. Electronically Signed   By: CHRISTELLA.  Shick M.D.   On: 03/18/2024 12:33   DG Chest 2 View Result Date: 03/18/2024 EXAM: 2 VIEW(S) XRAY OF THE CHEST 03/18/2024 01:16:00 AM COMPARISON: 08/10/2021 CLINICAL HISTORY: sob FINDINGS: LUNGS AND PLEURA: No focal pulmonary opacity. No pleural effusion. No pneumothorax. HEART AND MEDIASTINUM: Unchanged cardiomediastinal silhouette. Atherosclerotic plaque. BONES AND SOFT TISSUES: Thoracic degenerative changes. Bilateral acromioclavicular joint degenerative changes. IMPRESSION: 1. No acute cardiopulmonary findings. Electronically signed by: Morgane Naveau MD MD 03/18/2024 01:18 AM EST RP Workstation: [id number]   Data Reviewed: Relevant notes from primary care and specialist visits, past discharge summaries as available in EHR, including Care Everywhere . Prior diagnostic testing as pertinent to current admission diagnoses, Updated medications and problem lists for reconciliation .ED course, including vitals, labs, imaging, treatment and response to treatment,Triage notes, nursing and pharmacy notes and ED provider's notes.Notable results as noted in HPI.Discussed case with EDMD/ ED APP/ or Specialty MD on call and as needed.  Assessment & Plan  >> Shortness of breath: Likely secondary to acute decompensated heart failure with volume overload, as evidenced by bilateral lower extremity edema, elevated BNP of 6184, and recent history of dizziness and fall. Chest imaging shows cardiomegaly with small pericardial effusion but no acute pulmonary findings.    >> Acute on chronic combined systolic and diastolic congestive heart failure: Patient  with known HFpEF (October 2024 echo showing normal LV/RV function  with severe biatrial enlargement, mild-moderate MR, moderate-severe TR) presenting with acute decompensation. ProBNP significantly elevated at 6184.  Continue IV furosemide . Monitor daily weights, strict I/Os, clinical signs of congestion.  Resume home GDMT Entresto  97-103 mg BID, carvedilol  25 mg BID , Jardiance  10 mg daily ,once euvolemic. Cardiology consultation as ordered for optimization of HF management   >> Permanent A-fib: Rate-controlled permanent atrial fibrillation with variable ventricular response  Continue apixaban  5 mg BID for stroke prevention. Continue carvedilol  25 mg BID for rate control as tolerated by blood pressure. Telemetry for rate control and rhythm. Repeat EKG given poor quality initial study.   >> Nonrheumatic mitral valve regurgitation and tricuspid valve regurgitation: Moderate-severe TR and mild-moderate MR on October 2024 echo, attributed to severe biatrial enlargement from chronic AF and HF. Contributes to right-sided congestion and may impair decongestion.   >>Fall: Recent fall with small left frontal scalp hematoma: Head CT negative for intracranial pathology. Continue anticoagulation given stroke risk outweighs bleeding risk. Fall precaution.    >>Hyponatremia:  Likely dilutional from volume overload. Should improve with diuresis. Monitor closely.   >>Acute kidney injury on CKD : Baseline renal function appears similar . Likely cardiorenal syndrome.  Lab Results  Component Value Date   CREATININE 1.25 (H) 03/18/2024   CREATININE 1.20 (H) 12/31/2023   CREATININE 1.19 (H) 12/11/2023    DVT prophylaxis:  Eliquis .  Consults:  Cardiology.   Advance Care Planning:    Code Status: Full Code   Family Communication:  Son at bedside.  Disposition Plan:  Home. Severity of Illness: The appropriate patient status for this patient is INPATIENT. Inpatient status is judged to be reasonable and necessary in order to provide the required intensity of  service to ensure the patient's safety. The patient's presenting symptoms, physical exam findings, and initial radiographic and laboratory data in the context of their chronic comorbidities is felt to place them at high risk for further clinical  deterioration. Furthermore, it is not anticipated that the patient will be medically stable for discharge from the hospital within 2 midnights of admission.   * I certify that at the point of admission it is my clinical judgment that the patient will require inpatient hospital care spanning beyond 2 midnights from the point of admission due to high intensity of service, high risk for further deterioration and high frequency of surveillance required.*  Unresulted Labs (From admission, onward)     Start     Ordered   03/19/24 0500  Magnesium  Tomorrow morning,   R        03/18/24 1618   03/19/24 0500  CBC with Differential/Platelet  Tomorrow morning,   R        03/18/24 1618   03/19/24 0500  Comprehensive metabolic panel with GFR  Tomorrow morning,   R        03/18/24 1618   03/19/24 0500  Phosphorus  Tomorrow morning,   R        03/18/24 1618   Pending  Basic metabolic panel  Once,   R        Pending   Pending  CBC  Once,   R        Pending           Meds ordered this encounter  Medications   furosemide  (LASIX ) injection 60 mg   DISCONTD: apixaban  (ELIQUIS ) tablet 5 mg   apixaban  (ELIQUIS ) tablet 5 mg   brimonidine  (ALPHAGAN ) 0.2 % ophthalmic solution 1 drop   DISCONTD: carvedilol  (COREG ) tablet 25 mg   furosemide  (LASIX ) injection 40 mg   sodium chloride  flush (NS) 0.9 % injection 3 mL   OR Linked Order Group    acetaminophen  (TYLENOL ) tablet 650 mg    acetaminophen  (TYLENOL ) suppository 650 mg   polyethylene glycol (MIRALAX  / GLYCOLAX ) packet 17 g   sodium chloride  flush (NS) 0.9 % injection 3 mL   sodium chloride  flush (NS) 0.9 % injection 3 mL   0.9 %  sodium chloride  infusion   hydrALAZINE  (APRESOLINE ) injection 5 mg   Orders  Placed This Encounter  Procedures   Resp panel by RT-PCR (RSV, Flu A&B, Covid) Anterior Nasal Swab   Respiratory (~20 pathogens) panel by PCR   DG Chest 2 View   CT Head Wo Contrast   NM Pulmonary Perfusion   CT Chest Wo Contrast   Basic metabolic panel   CBC   Pro Brain natriuretic peptide   Procalcitonin   Magnesium   Magnesium   CBC with Differential/Platelet   Comprehensive metabolic panel with GFR   Phosphorus   Diet Heart Room service appropriate? Yes; Fluid consistency: Thin; Fluid restriction: 1500 mL Fluid   Document Height and Actual Weight   If O2 Sat <94% administer O2 at 2 liters/minute via nasal cannula   ED Cardiac monitoring   Check Pulse Oximetry while ambulating   Notify physician (specify)   Initiate Heart Failure Care Plan   Daily weights   Strict intake and output   In and Out Cath   Patient Education:   Apply Heart Failure Care Plan   [facility] and [facility] only) Obtain REDS clips reading Every morning   Maintain IV access   Vital signs   Notify physician (specify)   Mobility Protocol: No Restrictions   Refer to Sidebar Report Mobility Protocol for Adult Inpatient   Initiate Adult Central Line Maintenance and Catheter Clearance Protocol for patients with central line (CVC, PICC,  Port, Hemodialysis, Trialysis)   If patient diabetic or glucose greater than 140 notify physician for Sliding Scale Insulin  Orders   Initiate CHG Protocol for patients in ICU/SD or any patient with a central line or foley catheter   Do not place and if present remove PureWick   Initiate Oral Care Protocol   Initiate Carrier Fluid Protocol   RN may order General Admission PRN Orders utilizing General Admission PRN medications (through manage orders) for the following patient needs: allergy symptoms (Claritin), cold sores (Carmex), cough (Robitussin DM), eye irritation (Liquifilm Tears), hemorrhoids (Tucks), indigestion (Maalox), minor skin irritation (Hydrocortisone Cream), muscle pain  Lucienne Gay), nose irritation (saline nasal spray) and sore throat (Chloraseptic spray).   Cardiac Monitoring - Continuous Indefinite   Ambulate with assistance   Wound care   Full code   Consult for [facility] Medical Admission   Inpatient consult to Cardiology Consult Timeframe: ROUTINE - requires response within 24 hours; Reason for Consult? CHF Already called   Consult to Transition of Care Team   Consult to Heart Failure Navigation Team [facility], [facility], and [facility])   Nutritional services consult   Droplet precaution   OT eval and treat   PT eval and treat   Pulse oximetry check with vital signs   Oxygen therapy Mode or (Route): Nasal cannula; Liters Per Minute: 2; Keep O2 saturation between: greater than 92 %   Incentive spirometry   I-Stat venous blood gas, ([facility] ED, [facility], [facility])   EKG 12-Lead   ED EKG   EKG   EKG 12-Lead   ECHOCARDIOGRAM COMPLETE   Insert peripheral IV   Admit to Inpatient (patient's expected length of stay will be greater than 2 midnights or inpatient only procedure)   Aspiration precautions   Fall precautions   Author: Mario LULLA Blanch, MD 12 pm- 8 pm. [facility]. 03/18/2024 7:47 PM Please note for any communication after hours contact [facility] Assigned provider on call on Amion.     [1] No Known Allergies  "

## 2024-03-18 NOTE — ED Triage Notes (Signed)
 Pt arrived from home via POV c/o SOB and BLE +4 pitting edema, weeping x 2 weeks that just keeps getting worse

## 2024-03-19 ENCOUNTER — Inpatient Hospital Stay (HOSPITAL_COMMUNITY)

## 2024-03-19 DIAGNOSIS — I340 Nonrheumatic mitral (valve) insufficiency: Secondary | ICD-10-CM

## 2024-03-19 DIAGNOSIS — R0602 Shortness of breath: Secondary | ICD-10-CM | POA: Diagnosis not present

## 2024-03-19 DIAGNOSIS — I361 Nonrheumatic tricuspid (valve) insufficiency: Secondary | ICD-10-CM

## 2024-03-19 LAB — CBC WITH DIFFERENTIAL/PLATELET
Abs Immature Granulocytes: 0.02 K/uL (ref 0.00–0.07)
Basophils Absolute: 0 K/uL (ref 0.0–0.1)
Basophils Relative: 1 %
Eosinophils Absolute: 0 K/uL (ref 0.0–0.5)
Eosinophils Relative: 0 %
HCT: 41.3 % (ref 36.0–46.0)
Hemoglobin: 13.8 g/dL (ref 12.0–15.0)
Immature Granulocytes: 0 %
Lymphocytes Relative: 11 %
Lymphs Abs: 0.5 K/uL — ABNORMAL LOW (ref 0.7–4.0)
MCH: 32.6 pg (ref 26.0–34.0)
MCHC: 33.4 g/dL (ref 30.0–36.0)
MCV: 97.6 fL (ref 80.0–100.0)
Monocytes Absolute: 0.4 K/uL (ref 0.1–1.0)
Monocytes Relative: 8 %
Neutro Abs: 4.1 K/uL (ref 1.7–7.7)
Neutrophils Relative %: 80 %
Platelets: 212 K/uL (ref 150–400)
RBC: 4.23 MIL/uL (ref 3.87–5.11)
RDW: 14.3 % (ref 11.5–15.5)
WBC: 5.1 K/uL (ref 4.0–10.5)
nRBC: 0 % (ref 0.0–0.2)

## 2024-03-19 LAB — COMPREHENSIVE METABOLIC PANEL WITH GFR
ALT: 12 U/L (ref 0–44)
AST: 22 U/L (ref 15–41)
Albumin: 3.9 g/dL (ref 3.5–5.0)
Alkaline Phosphatase: 57 U/L (ref 38–126)
Anion gap: 10 (ref 5–15)
BUN: 29 mg/dL — ABNORMAL HIGH (ref 8–23)
CO2: 33 mmol/L — ABNORMAL HIGH (ref 22–32)
Calcium: 9.1 mg/dL (ref 8.9–10.3)
Chloride: 95 mmol/L — ABNORMAL LOW (ref 98–111)
Creatinine, Ser: 1.14 mg/dL — ABNORMAL HIGH (ref 0.44–1.00)
GFR, Estimated: 48 mL/min — ABNORMAL LOW
Glucose, Bld: 100 mg/dL — ABNORMAL HIGH (ref 70–99)
Potassium: 3.5 mmol/L (ref 3.5–5.1)
Sodium: 138 mmol/L (ref 135–145)
Total Bilirubin: 0.8 mg/dL (ref 0.0–1.2)
Total Protein: 8 g/dL (ref 6.5–8.1)

## 2024-03-19 LAB — ECHOCARDIOGRAM COMPLETE
Area-P 1/2: 3.79 cm2
Calc EF: 58.4 %
Height: 64 in
MV M vel: 5.42 m/s
MV Peak grad: 117.5 mmHg
Radius: 0.5 cm
S' Lateral: 3.2 cm
Single Plane A2C EF: 59.3 %
Single Plane A4C EF: 58.3 %
Weight: 2952.4 [oz_av]

## 2024-03-19 LAB — MAGNESIUM: Magnesium: 2.1 mg/dL (ref 1.7–2.4)

## 2024-03-19 LAB — PHOSPHORUS: Phosphorus: 3.5 mg/dL (ref 2.5–4.6)

## 2024-03-19 MED ORDER — POTASSIUM CHLORIDE CRYS ER 20 MEQ PO TBCR
40.0000 meq | EXTENDED_RELEASE_TABLET | Freq: Once | ORAL | Status: AC
  Administered 2024-03-19: 40 meq via ORAL
  Filled 2024-03-19: qty 2

## 2024-03-19 MED ORDER — SACUBITRIL-VALSARTAN 24-26 MG PO TABS
1.0000 | ORAL_TABLET | Freq: Two times a day (BID) | ORAL | Status: DC
  Administered 2024-03-19 – 2024-03-23 (×10): 1 via ORAL
  Filled 2024-03-19 (×10): qty 1

## 2024-03-19 MED ORDER — TECHNETIUM TO 99M ALBUMIN AGGREGATED
4.3800 | Freq: Once | INTRAVENOUS | Status: AC | PRN
  Administered 2024-03-19: 4.38 via INTRAVENOUS

## 2024-03-19 MED ORDER — ISOSORB DINITRATE-HYDRALAZINE 20-37.5 MG PO TABS: 1.0000 | ORAL_TABLET | Freq: Three times a day (TID) | ORAL | Status: DC

## 2024-03-19 MED ORDER — EMPAGLIFLOZIN 10 MG PO TABS
10.0000 mg | ORAL_TABLET | Freq: Every day | ORAL | Status: DC
  Administered 2024-03-19 – 2024-03-24 (×6): 10 mg via ORAL
  Filled 2024-03-19 (×7): qty 1

## 2024-03-19 NOTE — Progress Notes (Signed)
 PT Cancellation Note  Patient Details Name: [name] Long MRN: [id number] DOB: 1940/12/11   Cancelled Treatment:    Reason Eval/Treat Not Completed: Patient not medically ready (RN requesting hold following pt's attempt to mobilize with OT with BP reading 190s. Plan to follow up when pt is more appropriate to be seen.)   Leontine Hilt DPT Acute Rehab Services [phone] Prefer contact via chat   Leontine KATHEE Hilt 03/19/2024, 8:38 AM

## 2024-03-19 NOTE — Progress Notes (Signed)
 Patient ID: [name] Long, female   DOB: Jul 23, 1940, 84 y.o.   MRN: [id number] Ambulated in room to door and back. Upon standing vitals were  BP (!) 153/80   Pulse (!) 34   Temp 97.6 F (36.4 C)   Resp (!) 22   Ht 5' 4 (1.626 m)   Wt 83.7 kg   SpO2 (!) 87%   BMI 31.67 kg/m    Patient tolerated fair, dyspnea on exertion.  Verdie JONETTA Collier, RN

## 2024-03-19 NOTE — Plan of Care (Signed)
   Problem: Clinical Measurements: Goal: Ability to maintain clinical measurements within normal limits will improve Outcome: Progressing

## 2024-03-19 NOTE — Progress Notes (Addendum)
"  ° °  Patient Name: [name] Long Date of Encounter: 03/19/2024 [facility] Cardiologist: Ozell Fell, MD   Interval Summary  .    Still has some shortness of breath and leg swelling, as well as lightheadedness  Vital Signs .    Vitals:   03/18/24 2154 03/19/24 0021 03/19/24 0409 03/19/24 0500  BP: (!) 159/71 (!) 150/69 (!) 114/58   Pulse: (!) 58 (!) 51 (!) 56   Resp: 19 13 13    Temp: (!) 97.5 F (36.4 C) 97.6 F (36.4 C) 97.6 F (36.4 C)   TempSrc:  Oral Oral   SpO2: 97% 100% 100%   Weight:  83.7 kg  83.7 kg  Height:        Intake/Output Summary (Last 24 hours) at 03/19/2024 1049 Last data filed at 03/18/2024 1248 Gross per 24 hour  Intake --  Output 600 ml  Net -600 ml      03/19/2024    5:00 AM 03/19/2024   12:21 AM 03/18/2024   12:55 AM  Last 3 Weights  Weight (lbs) 184 lb 8.4 oz 184 lb 8.4 oz 160 lb 15 oz  Weight (kg) 83.7 kg 83.7 kg 73 kg      Telemetry/ECG    Telemetry 03/19/2024- Personally Reviewed A-fib with rates in 50s-70s  Physical Exam .   Physical Exam Vitals and nursing note reviewed.  Constitutional:      General: She is not in acute distress. Neck:     Vascular: JVD present.  Cardiovascular:     Rate and Rhythm: Normal rate and regular rhythm.     Heart sounds: Normal heart sounds. No murmur heard. Pulmonary:     Effort: Accessory muscle usage present.     Breath sounds: Rales present. No wheezing.  Musculoskeletal:     Right lower leg: Edema (1+) present.     Left lower leg: Edema (1+, dressing on blab on left leg) present.      Assessment & Plan .     84 y.o. 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   Lightheadedness, bilateral leg edema, dyspnea on exertion: I suspect patient has had worsening of her pulmonary hypertension in the setting of longstanding A-fib and mitral and tricuspid  regurgitation. She still has accessory muscle use, rales, JVD, leg edema. Continue IV Lasix  40 mg twice daily, need to monitor intake/output strictly. I would prefer resuming Entresto  at least 24-26 mg twice daily over Bidil , continue Jardiance , hold Coreg  given acute decompensation and resting heart rate in 50s. Discussed with son at bedside.  They are open to repeat echocardiogram, possibly improved from heart failure team, as well as right heart cath plus minus TEE. Will start with transthoracic echocardiogram today.  Will plan for right heart catheterization tomorrow.  I am not sure she will tolerate TEE at this time so hold for now.  Permanent atrial fibrillation: Hold Eliquis , anticipating right heart cath tomorrow.  We can use heparin  in the meantime.    For questions or updates, please contact [facility] Please consult [email or web address] for contact info under        Signed, Newman JINNY Lawrence, MD   "

## 2024-03-19 NOTE — TOC CM/SW Note (Signed)
 Transition of Care [facility]) - Inpatient Brief Assessment   Patient Details  Name: Elayne Gruver MRN: [id number] Date of Birth: 1940-11-10  Transition of [facility]) CM/SW Contact:    Waddell Barnie Rama, RN Phone Number: 03/19/2024, 3:31 PM   Clinical Narrative: From home with spouse, has PCP  in [facility] , she is back and forth between [address] and [address], and insurance on file, states has no HH services in place at this time , has walker/cane at home.  States family member will transport them home at [employer] and family is support system, states she is ok with [facility] filling her medications on this admission.  Pta self ambulatory with walker/cane.  Await PT eval for recs.    Transition of Care Asessment: Insurance and Status: Insurance coverage has been reviewed Patient has primary care physician: Yes (sees Ozell Fell (Cardiologist)) Home environment has been reviewed: home with daughter visiting Prior level of function:: ambulatory with walker,cane Prior/Current Home Services: Current home services (walker/cane) Social Drivers of Health Review: SDOH reviewed no interventions necessary Readmission risk has been reviewed: Yes Transition of care needs: no transition of care needs at this time

## 2024-03-19 NOTE — Evaluation (Signed)
 Occupational Therapy Evaluation Patient Details Name: [name] Long MRN: [id number] DOB: 04-22-1940 Today's Date: 03/19/2024   History of Present Illness   Pt is a 84 y.o. female who presented 03/18/24 due to SOB and LE edema. Pt also reported being dizzy since 02/05/24 and had a fall. IMG shows hematoma on head CT. 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 She lives with her husband in a single level story home with 5 STE with B rails to enter. She reported that her husband normally assist with LB ADLS. At this time evaluation was limited due to vitals. At this time required mod assist to EOB and then to return to supine. She then needed max x2 to reposition in bed. Nursing was made aware of vitals in session. Patient will benefit from continued inpatient follow up therapy, <3 hours/day.   BP: Supine: 160/85 (107) Sitting: 176/93 (115) HR 60-80s Repeat Sitting: 192/76 (111) HR 70s Supine: 170/77 (105) HR 60-70s, placed back on 2L of o2 as was at 85% on RA     If plan is discharge home, recommend the following:   A lot of help with walking and/or transfers;A lot of help with bathing/dressing/bathroom;Assistance with cooking/housework;Assist for transportation;Help with stairs or ramp for entrance     Functional Status Assessment   Patient has had a recent decline in their functional status and demonstrates the ability to make significant improvements in function in a reasonable and predictable amount of time.     Equipment Recommendations    (TBD)     Recommendations for Other Services         Precautions/Restrictions   Precautions Precautions: Fall Recall of Precautions/Restrictions:  Intact Precaution/Restrictions Comments: pt reported no other falls then on 02/05/24 Restrictions Weight Bearing Restrictions Per Provider Order: No     Mobility Bed Mobility Overal bed mobility: Needs Assistance Bed Mobility: Supine to Sit, Sit to Supine     Supine to sit: Mod assist, HOB elevated, Used rails Sit to supine: Mod assist, HOB elevated, Used rails   General bed mobility comments: Intially attempted with bed flat but was unable to complete even with HOB elevated then required mod assist    Transfers                   General transfer comment: deffer due to vitals      Balance Overall balance assessment: Needs assistance Sitting-balance support: Feet supported, Bilateral upper extremity supported, No upper extremity supported Sitting balance-Leahy Scale: Good                                     ADL either performed or assessed with clinical judgement   ADL Overall ADL's : Needs assistance/impaired Eating/Feeding: Set up;Minimal assistance;Sitting   Grooming: Contact guard assist;Minimal assistance;Sitting   Upper Body Bathing: Minimal assistance;Sitting       Upper Body Dressing : Minimal assistance;Sitting                           Vision Baseline Vision/History: 1 Wears glasses Ability to See in Adequate Light: 0 Adequate Patient Visual Report:  No change from baseline Vision Assessment?: Wears glasses for reading;Wears glasses for driving     Perception Perception: Within Functional Limits       Praxis Praxis: WFL       Pertinent Vitals/Pain Pain Assessment Pain Assessment: No/denies pain     Extremity/Trunk Assessment Upper Extremity Assessment Upper Extremity Assessment:  (BUE chronic limited AROM in BUE)   Lower Extremity Assessment Lower Extremity Assessment: Defer to PT evaluation   Cervical / Trunk Assessment Cervical / Trunk Assessment: Kyphotic   Communication Communication Communication:  No apparent difficulties Factors Affecting Communication: Hearing impaired   Cognition Arousal: Alert Behavior During Therapy: WFL for tasks assessed/performed Cognition: No apparent impairments                               Following commands: Intact       Cueing  General Comments   Cueing Techniques: Verbal cues      Exercises     Shoulder Instructions      Home Living Family/patient expects to be discharged to:: Private residence Living Arrangements: Spouse/significant other Available Help at Discharge: Family Type of Home: House Home Access: Stairs to enter Secretary/administrator of Steps: 5 Entrance Stairs-Rails: Right;Can reach both;Left Home Layout: One level     Bathroom Shower/Tub: Producer, Television/film/video: Standard Bathroom Accessibility: Yes How Accessible: Accessible via walker Home Equipment: Cane - single point;Hand held shower head   Additional Comments: per pt reproted they got walker from neighbor      Prior Functioning/Environment Prior Level of Function : Independent/Modified Independent             Mobility Comments: pt reported they were using a cane but since the fall they started to use a walker ADLs Comments: per pt husband assists with LB ADLS    OT Problem List: Decreased strength;Decreased activity tolerance;Impaired balance (sitting and/or standing);Decreased safety awareness;Decreased knowledge of use of DME or AE   OT Treatment/Interventions: Self-care/ADL training;Therapeutic exercise;DME and/or AE instruction;Therapeutic activities;Patient/family education;Balance training      OT Goals(Current goals can be found in the care plan section)   Acute Rehab OT Goals Patient Stated Goal: to get better OT Goal Formulation: With patient Time For Goal Achievement: 04/02/24 Potential to Achieve Goals: Good   OT Frequency:  Min 2X/week    Co-evaluation              AM-PAC OT 6 Clicks Daily  Activity     Outcome Measure Help from another person eating meals?: A Little Help from another person taking care of personal grooming?: A Little Help from another person toileting, which includes using toliet, bedpan, or urinal?: A Lot Help from another person bathing (including washing, rinsing, drying)?: A Lot Help from another person to put on and taking off regular upper body clothing?: A Little Help from another person to put on and taking off regular lower body clothing?: A Lot 6 Click Score: 15   End of Session Nurse Communication: Mobility status;Other (comment) (vitals)  Activity Tolerance: Other (comment) (vitals) Patient left: in bed;with call bell/phone within reach;with bed alarm set (in chair position)  OT Visit Diagnosis: Unsteadiness on feet (R26.81);Other abnormalities of gait and mobility (R26.89);Muscle weakness (generalized) (M62.81)                Time: 9271-9197 OT Time Calculation (min): 34 min Charges:  OT General Charges $OT Visit: 1 Visit OT Evaluation $OT  Eval Low Complexity: 1 Low OT Treatments $Self Care/Home Management : 8-22 mins  Warrick POUR OTR/L  Acute Rehab Services  [phone] office number   Warrick Berber 03/19/2024, 8:16 AM

## 2024-03-19 NOTE — Progress Notes (Signed)
 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  CXR in the ED was without acute findings.  CT chest revealed cardiomegaly and a small pericardial effusion but no other acute findings.  Goals of Care:   Code Status: Full Code   DVT prophylaxis:  apixaban  (ELIQUIS ) tablet 5 mg   Interim Hx: Afebrile since admission.  Heart rate controlled.  Blood pressure stable.  Oxygen saturations are 100% on room air.  Reports that her shortness of breath has not significantly improved since admission.  Assessment & Plan:  Pulmonary HTN with acute exacerbation -dizziness, bilateral lower extremity edema, DOE Cardiology following -diuresis ongoing -plan for right heart cath tomorrow  Permanent atrial fibrillation Rate controlled -continue Eliquis   Nonrheumatic mitral valve, pulmonic valve, and tricuspid valve regurgitation Felt to be related to severe biatrial enlargement due to chronic A-fib and pulmonary hypertension  Recent fall Has a small left frontal scalp hematoma -CT head without acute findings -continue anticoagulation for now -PT/OT  Hyponatremia Due to volume overload  CKD Felt to be cardiorenal -creatinine stable  Family Communication: No family present at time of exam Disposition: Anticipate discharge home once medically stable   Objective: Blood pressure (!) 114/58, pulse (!) 56, temperature 97.6 F (36.4 C), temperature source Oral, resp. rate 13, height 5' 4 (1.626 m), weight 83.7 kg, SpO2 100%.  Intake/Output Summary (Last 24 hours) at 03/19/2024 0848 Last data filed at 03/18/2024 1248 Gross per  24 hour  Intake --  Output 600 ml  Net -600 ml   Filed Weights   03/18/24 0055 03/19/24 0021 03/19/24 0500  Weight: 73 kg 83.7 kg 83.7 kg    Examination: General: No acute respiratory distress Lungs: Clear to auscultation bilaterally without wheezes or crackles Cardiovascular: Regular rate and rhythm without murmur gallop or rub normal S1 and S2 Abdomen: Nontender, nondistended, soft, bowel sounds positive, no rebound, no ascites, no appreciable mass Extremities: Chronic appearing 2+ edema bilateral lower extremities  CBC: Recent Labs  Lab 03/18/24 0107 03/18/24 1421 03/19/24 0318  WBC 4.3  --  5.1  NEUTROABS  --   --  4.1  HGB 13.1 14.3 13.8  HCT 39.4 42.0 41.3  MCV 97.5  --  97.6  PLT 216  --  212   Basic Metabolic Panel: Recent Labs  Lab 03/18/24 0107 03/18/24 1421 03/18/24 1619 03/19/24 0318  NA 132* 137  --  138  K 3.9 3.6  --  3.5  CL 92*  --   --  95*  CO2 29  --   --  33*  GLUCOSE 134*  --   --  100*  BUN 31*  --   --  29*  CREATININE 1.25*  --   --  1.14*  CALCIUM  9.0  --   --  9.1  MG  --   --  2.1 2.1  PHOS  --   --   --  3.5   GFR: Estimated Creatinine Clearance: 39.1 mL/min (A) (by C-G formula based on SCr of 1.14 mg/dL (H)).   Scheduled Meds:  apixaban   5 mg Oral BID   brimonidine   1 drop  Both Eyes BID   furosemide   40 mg Intravenous Q12H   isosorbide -hydrALAZINE   1 tablet Oral TID   potassium chloride   40 mEq Oral Once   sodium chloride  flush  3 mL Intravenous Q12H   Continuous Infusions:     LOS: 1 day   Reyes IVAR Moores, MD [facility] Office  [phone] Pager - Text Page per Tracey  If 7PM-7AM, please contact night-coverage per Amion 03/19/2024, 8:48 AM     "

## 2024-03-20 ENCOUNTER — Encounter (HOSPITAL_COMMUNITY): Payer: Self-pay | Admitting: Cardiology

## 2024-03-20 ENCOUNTER — Encounter (HOSPITAL_COMMUNITY)
Admission: EM | Disposition: A | Discharge status: Skilled Nursing Facility | Payer: Self-pay | Source: Home / Self Care | Attending: Internal Medicine

## 2024-03-20 DIAGNOSIS — R0602 Shortness of breath: Secondary | ICD-10-CM | POA: Diagnosis not present

## 2024-03-20 DIAGNOSIS — I5043 Acute on chronic combined systolic (congestive) and diastolic (congestive) heart failure: Secondary | ICD-10-CM

## 2024-03-20 DIAGNOSIS — I50810 Right heart failure, unspecified: Secondary | ICD-10-CM

## 2024-03-20 HISTORY — PX: RIGHT HEART CATH: CATH118263

## 2024-03-20 LAB — POCT I-STAT EG7
Acid-Base Excess: 11 mmol/L — ABNORMAL HIGH (ref 0.0–2.0)
Acid-Base Excess: 9 mmol/L — ABNORMAL HIGH (ref 0.0–2.0)
Bicarbonate: 37.3 mmol/L — ABNORMAL HIGH (ref 20.0–28.0)
Bicarbonate: 38.8 mmol/L — ABNORMAL HIGH (ref 20.0–28.0)
Calcium, Ion: 1.19 mmol/L (ref 1.15–1.40)
Calcium, Ion: 1.19 mmol/L (ref 1.15–1.40)
HCT: 39 % (ref 36.0–46.0)
HCT: 39 % (ref 36.0–46.0)
Hemoglobin: 13.3 g/dL (ref 12.0–15.0)
Hemoglobin: 13.3 g/dL (ref 12.0–15.0)
O2 Saturation: 58 %
O2 Saturation: 63 %
Potassium: 3.6 mmol/L (ref 3.5–5.1)
Potassium: 3.6 mmol/L (ref 3.5–5.1)
Sodium: 141 mmol/L (ref 135–145)
Sodium: 141 mmol/L (ref 135–145)
TCO2: 39 mmol/L — ABNORMAL HIGH (ref 22–32)
TCO2: 41 mmol/L — ABNORMAL HIGH (ref 22–32)
pCO2, Ven: 66.5 mmHg — ABNORMAL HIGH (ref 44–60)
pCO2, Ven: 68.3 mmHg — ABNORMAL HIGH (ref 44–60)
pH, Ven: 7.357 (ref 7.25–7.43)
pH, Ven: 7.362 (ref 7.25–7.43)
pO2, Ven: 33 mmHg (ref 32–45)
pO2, Ven: 35 mmHg (ref 32–45)

## 2024-03-20 LAB — MAGNESIUM: Magnesium: 2 mg/dL (ref 1.7–2.4)

## 2024-03-20 LAB — BASIC METABOLIC PANEL WITH GFR
Anion gap: 5 (ref 5–15)
BUN: 29 mg/dL — ABNORMAL HIGH (ref 8–23)
CO2: 38 mmol/L — ABNORMAL HIGH (ref 22–32)
Calcium: 8.7 mg/dL — ABNORMAL LOW (ref 8.9–10.3)
Chloride: 98 mmol/L (ref 98–111)
Creatinine, Ser: 1.19 mg/dL — ABNORMAL HIGH (ref 0.44–1.00)
GFR, Estimated: 45 mL/min — ABNORMAL LOW
Glucose, Bld: 106 mg/dL — ABNORMAL HIGH (ref 70–99)
Potassium: 4.3 mmol/L (ref 3.5–5.1)
Sodium: 141 mmol/L (ref 135–145)

## 2024-03-20 MED ORDER — ONDANSETRON HCL 4 MG/2ML IJ SOLN: 4.0000 mg | Freq: Four times a day (QID) | INTRAMUSCULAR | Status: DC | PRN

## 2024-03-20 MED ORDER — SODIUM CHLORIDE 0.9% FLUSH: 3.0000 mL | INTRAVENOUS | Status: DC | PRN

## 2024-03-20 MED ORDER — LABETALOL HCL 5 MG/ML IV SOLN: 10.0000 mg | INTRAVENOUS | Status: DC | PRN

## 2024-03-20 MED ORDER — LIDOCAINE HCL (PF) 1 % IJ SOLN
INTRAMUSCULAR | Status: DC | PRN
  Administered 2024-03-20: 2 mL

## 2024-03-20 MED ORDER — FREE WATER: 250.0000 mL | Freq: Once | Status: DC

## 2024-03-20 MED ORDER — ACETAMINOPHEN 325 MG PO TABS: 650.0000 mg | ORAL_TABLET | ORAL | Status: DC | PRN

## 2024-03-20 MED ORDER — SODIUM CHLORIDE 0.9 % IV SOLN: 250.0000 mL | INTRAVENOUS | Status: DC | PRN

## 2024-03-20 MED ORDER — FUROSEMIDE 10 MG/ML IJ SOLN
80.0000 mg | Freq: Two times a day (BID) | INTRAMUSCULAR | Status: AC
  Administered 2024-03-20 (×2): 80 mg via INTRAVENOUS
  Filled 2024-03-20 (×2): qty 8

## 2024-03-20 MED ORDER — HYDRALAZINE HCL 20 MG/ML IJ SOLN: 10.0000 mg | INTRAMUSCULAR | Status: AC | PRN

## 2024-03-20 MED ORDER — LIDOCAINE HCL (PF) 1 % IJ SOLN
INTRAMUSCULAR | Status: AC
  Filled 2024-03-20: qty 30

## 2024-03-20 MED ORDER — SODIUM CHLORIDE 0.9% FLUSH
3.0000 mL | Freq: Two times a day (BID) | INTRAVENOUS | Status: DC
  Administered 2024-03-20 (×2): 3 mL via INTRAVENOUS

## 2024-03-20 MED ORDER — HEPARIN (PORCINE) IN NACL 1000-0.9 UT/500ML-% IV SOLN
INTRAVENOUS | Status: DC | PRN
  Administered 2024-03-20: 500 mL

## 2024-03-20 NOTE — Plan of Care (Signed)
  Problem: Clinical Measurements: Goal: Respiratory complications will improve Outcome: Progressing Goal: Cardiovascular complication will be avoided Outcome: Progressing   Problem: Coping: Goal: Level of anxiety will decrease Outcome: Progressing   

## 2024-03-20 NOTE — H&P (View-Only) (Signed)
 "   Patient Name: [name] Long Date of Encounter: 03/20/2024 [facility] Cardiologist: Ozell Fell, MD   Interval Summary  .    Shortness of breath improving, oxygen saturation 100% on 2 L oxygen.  She is not on oxygen at home. She is unsure if her leg swelling is improved or not.  Vital Signs .    Vitals:   03/19/24 1500 03/19/24 2020 03/19/24 2346 03/20/24 0430  BP:  (!) 158/66 129/63 (!) 141/100  Pulse:  (!) 52 (!) 53   Resp: 16 18 13 18   Temp:  98.1 F (36.7 C) 98.5 F (36.9 C)   TempSrc:  Oral Oral   SpO2:  100% 100%   Weight:      Height:        Intake/Output Summary (Last 24 hours) at 03/20/2024 0433 Last data filed at 03/19/2024 2346 Gross per 24 hour  Intake 240 ml  Output 1700 ml  Net -1460 ml      03/19/2024    5:00 AM 03/19/2024   12:21 AM 03/18/2024   12:55 AM  Last 3 Weights  Weight (lbs) 184 lb 8.4 oz 184 lb 8.4 oz 160 lb 15 oz  Weight (kg) 83.7 kg 83.7 kg 73 kg      Telemetry/ECG    Telemetry 03/20/2024- Personally Reviewed A-fib with rates in 50s-70s  Echocardiogram 03/19/2024: 1. Left ventricular ejection fraction, by estimation, is 60 to 65%. The  left ventricle has normal function. The left ventricle has no regional  wall motion abnormalities. There is moderate left ventricular hypertrophy  of the basal-septal segment. Left ventricular diastolic parameters are indeterminate.  Elevated left atrial pressure.   2. Right ventricular systolic function is mildly reduced. The right  ventricular size is mildly enlarged. There is moderately elevated  pulmonary artery systolic pressure.   3. Left atrial size was severely dilated.   4. Right atrial size was severely dilated.   5. A small pericardial effusion is present.   6. The mitral valve is normal in structure. Mild to moderate mitral valve  regurgitation. No evidence of mitral stenosis.   7. Tricuspid valve regurgitation is moderate to severe.   8. The aortic valve is tricuspid. Aortic  valve regurgitation is not  visualized. Aortic valve sclerosis/calcification is present, without any  evidence of aortic stenosis.   9. Pulmonic valve regurgitation is moderate.  10. The inferior vena cava is dilated in size with <50% respiratory  variability, suggesting right atrial pressure of 15 mmHg.   Physical Exam .   Physical Exam Vitals and nursing note reviewed.  Constitutional:      General: She is not in acute distress. Neck:     Vascular: JVD present.  Cardiovascular:     Rate and Rhythm: Normal rate and regular rhythm.     Heart sounds: Normal heart sounds. No murmur heard. Pulmonary:     Effort: No accessory muscle usage.     Breath sounds: No wheezing or rales.  Musculoskeletal:     Right lower leg: Edema (1+) present.     Left lower leg: Edema (1+, dressing on blab on left leg) present.      Assessment & Plan .     84 y.o. 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   Lightheadedness, bilateral leg edema, dyspnea on exertion: Primarily right heart failure in the setting of longstanding A-fib. No accessory muscle use today,  appears more comfortable.  Can wean down supplemental oxygen as tolerated.   Net -2.6 L on IV Lasix  40 mg twice daily. Cr stable at 1.19.  Still has bilateral leg edema, continue IV Lasix  40 mg twice daily. Currently on Entresto  at least 24-26 mg twice daily, Jardiance  10 mg daily, holding Coreg  given acute decompensation and resting heart rate in 50s. Echocardiogram shows severe biatrial dilatation, only mild to moderate MR and moderate to severe TR with estimated RVSP of 52 mmHg.  It is likely that RVSP is underestimated given dilated IVC and elevated RA pressures. Will plan on right heart cath for better assessment of pulmonary pressures.  Given that primary valve issue is moderate to severe TR in the setting of  longstanding A-fib and pulmonary hypertension, management would likely remain medical only.  Permanent atrial fibrillation: Wth plans for right heart cath today, hold Eliquis  this morning, can resume this evening.    For questions or updates, please contact [facility] Please consult [email or web address] for contact info under        Signed, Newman JINNY Lawrence, MD   "

## 2024-03-20 NOTE — Progress Notes (Signed)
 "   Patient Name: [name] Long Date of Encounter: 03/20/2024 [facility] Cardiologist: Ozell Fell, MD   Interval Summary  .    Shortness of breath improving, oxygen saturation 100% on 2 L oxygen.  She is not on oxygen at home. She is unsure if her leg swelling is improved or not.  Vital Signs .    Vitals:   03/19/24 1500 03/19/24 2020 03/19/24 2346 03/20/24 0430  BP:  (!) 158/66 129/63 (!) 141/100  Pulse:  (!) 52 (!) 53   Resp: 16 18 13 18   Temp:  98.1 F (36.7 C) 98.5 F (36.9 C)   TempSrc:  Oral Oral   SpO2:  100% 100%   Weight:      Height:        Intake/Output Summary (Last 24 hours) at 03/20/2024 0433 Last data filed at 03/19/2024 2346 Gross per 24 hour  Intake 240 ml  Output 1700 ml  Net -1460 ml      03/19/2024    5:00 AM 03/19/2024   12:21 AM 03/18/2024   12:55 AM  Last 3 Weights  Weight (lbs) 184 lb 8.4 oz 184 lb 8.4 oz 160 lb 15 oz  Weight (kg) 83.7 kg 83.7 kg 73 kg      Telemetry/ECG    Telemetry 03/20/2024- Personally Reviewed A-fib with rates in 50s-70s  Echocardiogram 03/19/2024: 1. Left ventricular ejection fraction, by estimation, is 60 to 65%. The  left ventricle has normal function. The left ventricle has no regional  wall motion abnormalities. There is moderate left ventricular hypertrophy  of the basal-septal segment. Left ventricular diastolic parameters are indeterminate.  Elevated left atrial pressure.   2. Right ventricular systolic function is mildly reduced. The right  ventricular size is mildly enlarged. There is moderately elevated  pulmonary artery systolic pressure.   3. Left atrial size was severely dilated.   4. Right atrial size was severely dilated.   5. A small pericardial effusion is present.   6. The mitral valve is normal in structure. Mild to moderate mitral valve  regurgitation. No evidence of mitral stenosis.   7. Tricuspid valve regurgitation is moderate to severe.   8. The aortic valve is tricuspid. Aortic  valve regurgitation is not  visualized. Aortic valve sclerosis/calcification is present, without any  evidence of aortic stenosis.   9. Pulmonic valve regurgitation is moderate.  10. The inferior vena cava is dilated in size with <50% respiratory  variability, suggesting right atrial pressure of 15 mmHg.   Physical Exam .   Physical Exam Vitals and nursing note reviewed.  Constitutional:      General: She is not in acute distress. Neck:     Vascular: JVD present.  Cardiovascular:     Rate and Rhythm: Normal rate and regular rhythm.     Heart sounds: Normal heart sounds. No murmur heard. Pulmonary:     Effort: No accessory muscle usage.     Breath sounds: No wheezing or rales.  Musculoskeletal:     Right lower leg: Edema (1+) present.     Left lower leg: Edema (1+, dressing on blab on left leg) present.      Assessment & Plan .     84 y.o. 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   Lightheadedness, bilateral leg edema, dyspnea on exertion: Primarily right heart failure in the setting of longstanding A-fib. No accessory muscle use today,  appears more comfortable.  Can wean down supplemental oxygen as tolerated.   Net -2.6 L on IV Lasix  40 mg twice daily. Cr stable at 1.19.  Still has bilateral leg edema, continue IV Lasix  40 mg twice daily. Currently on Entresto  at least 24-26 mg twice daily, Jardiance  10 mg daily, holding Coreg  given acute decompensation and resting heart rate in 50s. Echocardiogram shows severe biatrial dilatation, only mild to moderate MR and moderate to severe TR with estimated RVSP of 52 mmHg.  It is likely that RVSP is underestimated given dilated IVC and elevated RA pressures. Will plan on right heart cath for better assessment of pulmonary pressures.  Given that primary valve issue is moderate to severe TR in the setting of  longstanding A-fib and pulmonary hypertension, management would likely remain medical only.  Permanent atrial fibrillation: Wth plans for right heart cath today, hold Eliquis  this morning, can resume this evening.    For questions or updates, please contact [facility] Please consult [email or web address] for contact info under        Signed, Newman JINNY Lawrence, MD   "

## 2024-03-20 NOTE — Evaluation (Addendum)
 Physical Therapy Evaluation Patient Details Name: [name] Long MRN: [id number] DOB: Jul 29, 1940 Today's Date: 03/20/2024  History of Present Illness  Pt is a 84 y.o. female who presented 03/18/24 due to SOB and LE edema. Pt also reported being dizzy since 02/05/24 and had a fall. IMG shows hematoma on head CT. 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 Pt presents to evaluation with deficits in mobility, strength, power, activity tolerance, and balance. Pt was able to perform bed mobility with moderate physical assistance of 2 and tolerate sitting EOB without physical assistance. Further mobility deferred due to elevated HR and elevated BP; RN aware. Pt was encouraged to do bed level exercises (heel slides, ankle pumps, SLR) multiple times a day to sustain LE strength. PT will continue to treat pt while she is admitted. Patient will benefit from continued inpatient follow up therapy, <3 hours/day.      BP Supine: 149/76, BP seated: 171/81, BP seated for 4 mins: 165/70, BP supine at end of session: 149/58 HR: 118-154 bpm    If plan is discharge home, recommend the following: A lot of help with walking and/or transfers;A lot of help with bathing/dressing/bathroom;Assistance with cooking/housework;Assist for transportation;Help with stairs or ramp for entrance   Can travel by private vehicle   No    Equipment Recommendations Wheelchair (measurements PT);Wheelchair cushion (measurements PT);Hospital bed;Hoyer lift  Recommendations for Other Services       Functional Status Assessment Patient has had a recent decline in their functional status and demonstrates the ability to make significant improvements in function in a  reasonable and predictable amount of time.     Precautions / Restrictions Precautions Precautions: Fall Recall of Precautions/Restrictions: Intact Restrictions Weight Bearing Restrictions Per Provider Order: No      Mobility  Bed Mobility Overal bed mobility: Needs Assistance Bed Mobility: Supine to Sit, Sit to Supine     Supine to sit: Mod assist, +2 for physical assistance, HOB elevated Sit to supine: Mod assist, +2 for physical assistance   General bed mobility comments: Pt able to initiate moving BLEs towards EOB but requires physical assistance for moving them completely off bed and for trunk management. For sit to supine, pt requires physical assistance for BLE and trunk management. VC given for sequencing; increased time to complete.    Transfers Overall transfer level:  (deferred due to elevated HR and BP)                      Ambulation/Gait                  Stairs            Wheelchair Mobility     Tilt Bed    Modified Rankin (Stroke Patients Only)       Balance Overall balance assessment: Needs assistance Sitting-balance support: Bilateral upper extremity supported, Feet supported Sitting balance-Leahy Scale: Poor Sitting balance - Comments: pt requires BUE support while sitting EOB to maintain upright position. Pt then is able to tolerated seated upright for ~5 mins with no losses of balance, but occasional posterolateral lean to the R.  Pertinent Vitals/Pain Pain Assessment Pain Assessment: No/denies pain    Home Living Family/patient expects to be discharged to:: Private residence Living Arrangements: Children Available Help at Discharge: Family;Available 24 hours/day Type of Home: House Home Access: Level entry     Alternate Level Stairs-Number of Steps: 12 Home Layout: Two level;1/2 bath on main level Home Equipment: Cane - single point;Rollator (4 wheels);Shower  seat;Grab bars - tub/shower;Hand held shower head      Prior Function Prior Level of Function : Needs assist       Physical Assist : Mobility (physical);ADLs (physical) Mobility (physical): Bed mobility;Transfers   Mobility Comments: Per pt report, she was needing physical assistance getting into and out of bed and with standing. Pt reports she would need physical assistance for BLE management to sit EOB and then she would pull up on her husband to stand and then transfer hands to RW. ADLs Comments: per pt husband assists with LB ADLS     Extremity/Trunk Assessment   Upper Extremity Assessment Upper Extremity Assessment: Defer to OT evaluation    Lower Extremity Assessment Lower Extremity Assessment: Generalized weakness;LLE deficits/detail LLE Deficits / Details: 3-/5 for knee flexion and extension    Cervical / Trunk Assessment Cervical / Trunk Assessment: Kyphotic  Communication   Communication Communication: Impaired Factors Affecting Communication: Hearing impaired    Cognition Arousal: Alert Behavior During Therapy: WFL for tasks assessed/performed   PT - Cognitive impairments: No apparent impairments                         Following commands: Intact       Cueing Cueing Techniques: Verbal cues, Gestural cues     General Comments General comments (skin integrity, edema, etc.): BP Supine: 149/76, BP seated: 171/81, BP seated for 4 mins: 165/70, BP supine at end of session: 149/58    Exercises General Exercises - Lower Extremity Ankle Circles/Pumps: AROM, Both, 10 reps Heel Slides: Strengthening, Both, 5 reps Straight Leg Raises: Strengthening, Both, 5 reps   Assessment/Plan    PT Assessment Patient needs continued PT services  PT Problem List Decreased strength;Decreased activity tolerance;Decreased range of motion;Decreased balance;Decreased mobility;Decreased knowledge of use of DME;Cardiopulmonary status limiting activity       PT  Treatment Interventions DME instruction;Gait training;Stair training;Functional mobility training;Therapeutic activities;Therapeutic exercise;Balance training;Wheelchair mobility training;Manual techniques;Modalities    PT Goals (Current goals can be found in the Care Plan section)  Acute Rehab PT Goals Patient Stated Goal: to feel better and get stronger PT Goal Formulation: With patient/family Time For Goal Achievement: 04/03/24 Potential to Achieve Goals: Fair    Frequency Min 2X/week     Co-evaluation               AM-PAC PT 6 Clicks Mobility  Outcome Measure Help needed turning from your back to your side while in a flat bed without using bedrails?: A Little Help needed moving from lying on your back to sitting on the side of a flat bed without using bedrails?: Total Help needed moving to and from a bed to a chair (including a wheelchair)?: Total Help needed standing up from a chair using your arms (e.g., wheelchair or bedside chair)?: Total Help needed to walk in hospital room?: Total Help needed climbing 3-5 steps with a railing? : Total 6 Click Score: 8    End of Session   Activity Tolerance: Treatment limited secondary to medical complications (Comment) (HTN) Patient left: in bed;with call bell/phone within reach;with  bed alarm set;with family/visitor present Nurse Communication: Mobility status;Other (comment) (vitals) PT Visit Diagnosis: Muscle weakness (generalized) (M62.81);Other abnormalities of gait and mobility (R26.89);History of falling (Z91.81)    Time: 8852-8787 PT Time Calculation (min) (ACUTE ONLY): 25 min   Charges:   PT Evaluation $PT Eval Moderate Complexity: 1 Mod   PT General Charges $$ ACUTE PT VISIT: 1 Visit         Leontine Hilt DPT Acute Rehab Services [phone] Prefer contact via chat   Leontine NOVAK Leonte Horrigan 03/20/2024, 1:47 PM

## 2024-03-20 NOTE — Interval H&P Note (Signed)
 History and Physical Interval Note:  03/20/2024 8:53 AM  [name] Long  has presented today for surgery, with the diagnosis of heart failure.  The various methods of treatment have been discussed with the patient and family. After consideration of risks, benefits and other options for treatment, the patient has consented to  Procedures: RIGHT HEART CATH (N/A) as a surgical intervention.  The patient's history has been reviewed, patient examined, no change in status, stable for surgery.  I have reviewed the patient's chart and labs.  Questions were answered to the patient's satisfaction.     Aldona Bryner [employer]

## 2024-03-20 NOTE — Progress Notes (Signed)
 "  [name] Long  FMW:[id number] DOB: March 07, 1940 DOA: 03/18/2024 PCP: Patient, No Pcp Per    Brief Narrative:  84 year old with a history of CHF, PAF, mitral and tricuspid valve regurgitation, Pulm HTN, DM, HTN, and MGUS who presented to the ER 1/13 with complaints of intermittent dizziness since early December leading to multiple accidental falls at home along with shortness of breath and bilateral lower extremity pitting edema.  CXR in the ED was without acute findings.  CT chest revealed cardiomegaly and a small pericardial effusion but no other acute findings.  Goals of Care:   Code Status: Full Code   DVT prophylaxis:  apixaban  (ELIQUIS ) tablet 5 mg   Interim Hx: No acute events recorded overnight.  Some bradycardia has been appreciated with heart rate 39-65.  Blood pressure stable.  Oxygen saturation 100% on 2 L.  The patient reports that she is feeling stable this morning.  She denies significant shortness of breath at this time.  She underwent her right heart cath earlier today though results are not yet available to me.  Assessment & Plan:  Pulmonary HTN with acute exacerbation - dizziness, bilateral lower extremity edema, DOE Cardiology following - diuresis ongoing with net negative balance of ~2L thus far - right heart cath today  Permanent atrial fibrillation Rate controlled with some bradycardia - beta-blocker on hold - continue Eliquis   Nonrheumatic mitral valve, pulmonic valve, and tricuspid valve regurgitation Felt to be related to severe biatrial enlargement due to chronic A-fib and pulmonary hypertension -right heart cath today  Recent fall Has a small left frontal scalp hematoma - CT head without acute findings - continue anticoagulation for now - PT/OT  Hyponatremia Due to volume overload - resolved with diuresis  CKD Felt to be cardiorenal - creatinine stable  Family Communication: Spoke with son and daughter at bedside Disposition: Anticipate discharge home once  medically stable, possibly as early as 1/16   Objective: Blood pressure (!) 141/100, pulse 61, temperature 98.5 F (36.9 C), temperature source Oral, resp. rate 18, height 5' 4 (1.626 m), weight 80.8 kg, SpO2 100%.  Intake/Output Summary (Last 24 hours) at 03/20/2024 0808 Last data filed at 03/19/2024 2346 Gross per 24 hour  Intake 240 ml  Output 1700 ml  Net -1460 ml   Filed Weights   03/19/24 0021 03/19/24 0500 03/20/24 0430  Weight: 83.7 kg 83.7 kg 80.8 kg    Examination: General: No acute respiratory distress Lungs: Clear to auscultation bilaterally  Cardiovascular: Regular rate and rhythm  Abdomen: NT/ND, soft, BS positive, no rebound Extremities: Chronic appearing 2+ edema bilateral lower extremities  CBC: Recent Labs  Lab 03/18/24 0107 03/18/24 1421 03/19/24 0318  WBC 4.3  --  5.1  NEUTROABS  --   --  4.1  HGB 13.1 14.3 13.8  HCT 39.4 42.0 41.3  MCV 97.5  --  97.6  PLT 216  --  212   Basic Metabolic Panel: Recent Labs  Lab 03/18/24 0107 03/18/24 1421 03/18/24 1619 03/19/24 0318 03/20/24 0247  NA 132* 137  --  138 141  K 3.9 3.6  --  3.5 4.3  CL 92*  --   --  95* 98  CO2 29  --   --  33* 38*  GLUCOSE 134*  --   --  100* 106*  BUN 31*  --   --  29* 29*  CREATININE 1.25*  --   --  1.14* 1.19*  CALCIUM  9.0  --   --  9.1 8.7*  MG  --   --  2.1 2.1 2.0  PHOS  --   --   --  3.5  --    GFR: Estimated Creatinine Clearance: 36.8 mL/min (A) (by C-G formula based on SCr of 1.19 mg/dL (H)).   Scheduled Meds:  apixaban   5 mg Oral BID   brimonidine   1 drop Both Eyes BID   empagliflozin   10 mg Oral Daily   furosemide   40 mg Intravenous Q12H   sacubitril -valsartan   1 tablet Oral BID   sodium chloride  flush  3 mL Intravenous Q12H     LOS: 2 days   Reyes IVAR Moores, MD [facility] Office  [phone] Pager - Text Page per Tracey  If 7PM-7AM, please contact night-coverage per Amion 03/20/2024, 8:08 AM     "

## 2024-03-21 DIAGNOSIS — R0602 Shortness of breath: Secondary | ICD-10-CM | POA: Diagnosis not present

## 2024-03-21 LAB — BASIC METABOLIC PANEL WITH GFR
Anion gap: 7 (ref 5–15)
BUN: 26 mg/dL — ABNORMAL HIGH (ref 8–23)
CO2: 36 mmol/L — ABNORMAL HIGH (ref 22–32)
Calcium: 8.4 mg/dL — ABNORMAL LOW (ref 8.9–10.3)
Chloride: 94 mmol/L — ABNORMAL LOW (ref 98–111)
Creatinine, Ser: 1.18 mg/dL — ABNORMAL HIGH (ref 0.44–1.00)
GFR, Estimated: 46 mL/min — ABNORMAL LOW
Glucose, Bld: 192 mg/dL — ABNORMAL HIGH (ref 70–99)
Potassium: 3.3 mmol/L — ABNORMAL LOW (ref 3.5–5.1)
Sodium: 137 mmol/L (ref 135–145)

## 2024-03-21 MED ORDER — CARVEDILOL 25 MG PO TABS: 25.0000 mg | ORAL_TABLET | Freq: Two times a day (BID) | ORAL | Status: DC

## 2024-03-21 MED ORDER — LATANOPROST 0.005 % OP SOLN
1.0000 [drp] | Freq: Every day | OPHTHALMIC | Status: DC
  Administered 2024-03-22 – 2024-03-23 (×2): 1 [drp] via OPHTHALMIC
  Filled 2024-03-21 (×2): qty 2.5

## 2024-03-21 MED ORDER — FUROSEMIDE 10 MG/ML IJ SOLN
80.0000 mg | Freq: Two times a day (BID) | INTRAMUSCULAR | Status: DC
  Administered 2024-03-21 – 2024-03-23 (×6): 80 mg via INTRAVENOUS
  Filled 2024-03-21 (×6): qty 8

## 2024-03-21 MED ORDER — POTASSIUM CHLORIDE CRYS ER 20 MEQ PO TBCR
40.0000 meq | EXTENDED_RELEASE_TABLET | ORAL | Status: AC
  Administered 2024-03-21 (×2): 40 meq via ORAL
  Filled 2024-03-21 (×2): qty 2

## 2024-03-21 MED ORDER — BRIMONIDINE TARTRATE 0.2 % OP SOLN
1.0000 [drp] | Freq: Every day | OPHTHALMIC | Status: DC
  Administered 2024-03-22 – 2024-03-23 (×2): 1 [drp] via OPHTHALMIC
  Filled 2024-03-21: qty 5

## 2024-03-21 NOTE — TOC Initial Note (Signed)
 Transition of Care [facility]) - Initial/Assessment Note    Patient Details  Name: [name] Long MRN: [id number] Date of Birth: 23-May-1940  Transition of [facility] CM/SW Contact:    Gwenn Julien [place], [place] Phone Number: 03/21/2024, 1:02 PM  Clinical Narrative: Spoke with pt and pt's dtr re PT recommendation for SNF. Pt with no prior SNF stay. Reviewed SNF placement process and answered questions. Pt and dtr report agreeable to SNF for STR. Will f/u with offer once available.    Julien Gwenn, MSW, LCSW [phone] (coverage)                    Expected Discharge Plan: Skilled Nursing Facility Barriers to Discharge: Continued Medical Work up, SNF Pending bed offer, Insurance Authorization   Patient Goals and CMS Choice            Expected Discharge Plan and Services     Post Acute Care Choice: Skilled Nursing Facility Living arrangements for the past 2 months: Single Family Home                                      Prior Living Arrangements/Services Living arrangements for the past 2 months: Single Family Home Lives with:: Adult Children Patient language and need for interpreter reviewed:: Yes        Need for Family Participation in Patient Care: Yes (Comment) Care giver support system in place?: Yes (comment)   Criminal Activity/Legal Involvement Pertinent to Current Situation/Hospitalization: No - Comment as needed  Activities of Daily Living   ADL Screening (condition at time of admission) Independently performs ADLs?: No Does the patient have a NEW difficulty with bathing/dressing/toileting/self-feeding that is expected to last >3 days?: Yes (Initiates electronic notice to provider for possible OT consult) Does the patient have a NEW difficulty with getting in/out of bed, walking, or climbing stairs that is expected to last >3 days?: Yes (Initiates electronic notice to provider for possible PT consult) Does the patient have a NEW difficulty with  communication that is expected to last >3 days?: No Is the patient deaf or have difficulty hearing?: No Does the patient have difficulty seeing, even when wearing glasses/contacts?: No Does the patient have difficulty concentrating, remembering, or making decisions?: No  Permission Sought/Granted Permission sought to share information with : Oceanographer granted to share information with : Yes, Verbal Permission Granted              Emotional Assessment       Orientation: : Oriented to Place, Oriented to  Time, Oriented to Self, Oriented to Situation Alcohol / Substance Use: Not Applicable Psych Involvement: No (comment)  Admission diagnosis:  Peripheral edema [R60.0] SOB (shortness of breath) [R06.02] Acute on chronic combined systolic and diastolic congestive heart failure (HCC) [I50.43] Dyspnea, unspecified type [R06.00] Patient Active Problem List   Diagnosis Date Noted   SOB (shortness of breath) 03/18/2024   AKI (acute kidney injury)    Acute CHF (congestive heart failure) (HCC) 08/28/2017   Hypertensive emergency 08/28/2017   Obesity, Class III, BMI 40-49.9 (morbid obesity) (HCC) 08/28/2017   Hypokalemia 08/28/2017   Acute on chronic combined systolic and diastolic CHF (congestive heart failure) (HCC)    Persistent atrial fibrillation (HCC)    Essential hypertension    Stage 3 chronic kidney disease (HCC)    Hypercholesterolemia 12/17/2015   Routine health maintenance 07/23/2015   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   PCP:  Patient, No Pcp Per Pharmacy:   [id number] - [place], [place] - [address] [place] [address] Phone: [phone] Fax: [phone]  [facility] - [place], [place] - [address] [facility] [address] [place] [address] [place] [address] Phone: [phone] Fax: [phone]  [id number] [place], [place]  - [address] AT [facility] [address] [facility] [address] [place] [address] Phone: [phone] Fax: [phone]  [id number] - [place], [place] - [address] [facility] [address] [place] [address] Phone: [phone] Fax: [phone]     Social Drivers of Health (SDOH) Social History: SDOH Screenings   Tobacco Use: Low Risk (03/18/2024)   SDOH Interventions:     Readmission Risk Interventions    03/19/2024    3:29 PM  Readmission Risk Prevention Plan  Post Dischage Appt Complete  Medication Screening Complete  Transportation Screening Complete

## 2024-03-21 NOTE — Plan of Care (Signed)

## 2024-03-21 NOTE — Progress Notes (Addendum)
 "   Patient Name: [name] Long Date of Encounter: 03/21/2024 [facility] Cardiologist: Ozell Fell, MD   Interval Summary  .    Continued improvement in leg swelling  Vital Signs .    Vitals:   03/20/24 1900 03/20/24 2300 03/21/24 0300 03/21/24 0815  BP: 135/65 (!) 133/98 138/68 (!) 172/64  Pulse: 70 62 63 88  Resp: 20 20 18 17   Temp: 98.5 F (36.9 C) 97.9 F (36.6 C) 98.1 F (36.7 C) 98 F (36.7 C)  TempSrc: Oral Oral Oral Oral  SpO2: 96% 90% 93% 98%  Weight:   80.1 kg   Height:   5' 4 (1.626 m)     Intake/Output Summary (Last 24 hours) at 03/21/2024 1002 Last data filed at 03/21/2024 0700 Gross per 24 hour  Intake 120 ml  Output 1700 ml  Net -1580 ml      03/21/2024    3:00 AM 03/20/2024    4:30 AM 03/19/2024    5:00 AM  Last 3 Weights  Weight (lbs) 176 lb 9.4 oz 178 lb 2.1 oz 184 lb 8.4 oz  Weight (kg) 80.1 kg 80.8 kg 83.7 kg      Telemetry/ECG    Telemetry 03/21/2024- Personally Reviewed A-fib with rates in 70s  RHC 03/20/2024: 1. Elevated right and left heart filling pressures.  RV failure seems mildly out of proportion to LV failure.  2. Preserved PAPi 3. 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  4. Low but not markedly low cardiac output.    Would continue diuresis, Lasix  increased to 80 mg IV bid.   Echocardiogram 03/19/2024: 1. Left ventricular ejection fraction, by estimation, is 60 to 65%. The  left ventricle has normal function. The left ventricle has no regional  wall motion abnormalities. There is moderate left ventricular hypertrophy  of the basal-septal segment. Left ventricular diastolic parameters are indeterminate.  Elevated left atrial pressure.   2. Right ventricular systolic function is mildly reduced. The right  ventricular size is mildly enlarged. There is moderately elevated  pulmonary artery systolic pressure.   3. Left atrial size was severely dilated.   4. Right atrial size was severely dilated.    5. A small pericardial effusion is present.   6. The mitral valve is normal in structure. Mild to moderate mitral valve  regurgitation. No evidence of mitral stenosis.   7. Tricuspid valve regurgitation is moderate to severe.   8. The aortic valve is tricuspid. Aortic valve regurgitation is not  visualized. Aortic valve sclerosis/calcification is present, without any  evidence of aortic stenosis.   9. Pulmonic valve regurgitation is moderate.  10. The inferior vena cava is dilated in size with <50% respiratory  variability, suggesting right atrial pressure of 15 mmHg.   Physical Exam .   Physical Exam Vitals and nursing note reviewed.  Constitutional:      General: She is not in acute distress. Neck:     Vascular: JVD present.  Cardiovascular:     Rate and Rhythm: Normal rate and regular rhythm.     Heart sounds: Normal heart sounds. No murmur heard. Pulmonary:     Effort: No accessory muscle usage.     Breath sounds: No wheezing or rales.  Musculoskeletal:     Right lower leg: Edema (1+) present.     Left lower leg: Edema (1+, dressing on bleb on left leg) present.      Assessment & Plan .     84 y.o. female with permanent A-fib,  longstanding moderate to severe mitral and tricuspid regurgitation, pulmonary hypertension, now admitted with decompensated RV failure   Acute on chronic HFpEF, right-sided heart failure: Primarily right heart failure in the setting of longstanding A-fib. RHC showed  1. Elevated right and left heart filling pressures.  RV failure seems mildly out of proportion to LV failure.  2. Preserved PAPi 3. 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  Net -3.5 L.  Diuresing and improving very well on IV Lasix  80 mg twice daily.  Given the renal function is tolerating, would continue same dose for 1 more day.  Depending on clinical improvement and urine output, can reduce Lasix  to IV 40 mg twice daily tomorrow, and then consider  transitioning to oral Lasix  40 mg twice daily on discharge. Anticipate she will need another 1-2 days of in-hospital diuresis.  Permanent atrial fibrillation: Continue Lasix  5 mg twice daily.  For questions or updates, please contact [facility] Please consult [email or web address] for contact info under        Signed, Newman JINNY Lawrence, MD   "

## 2024-03-21 NOTE — Progress Notes (Signed)
 Occupational Therapy Treatment Patient Details Name: [name] Long MRN: [id number] DOB: 03-11-1940 Today's Date: 03/21/2024   History of present illness Pt is a 84 y.o. female who presented 03/18/24 due to SOB and LE edema. Pt also reported being dizzy since 02/05/24 and had a fall. IMG shows hematoma on head CT. PMH: CHF, afib,  of  congestive heart failure, permanent atrial fibrillation, and valvular heart disease mitral and tricuspid regurgitation   OT comments  Pt. Seen for skilled OT treatment session.  Pt. Agreeable to participation, however session remained limited secondary to elevated BP/HR.  Pt. Able to complete bed level grooming tasks with set up/S.  Education, demo/return demo provided on need for initiation of rest breaks in conjunction with PLB.  Pt. Able to return demo of PLB but cont. To require cues for initiating it during rest breaks.  Will benefit from cont. EC education during mobility/ADLs next session.    -BP supine: 172/64-95 -02 [place] 2L 83% during oral care, rebound to 97% with PLB and cues for rest break -HR: 97-124 with minimal adjustment of hob for pt. To sit more upright, mostly holding around high 90s for duration of session       If plan is discharge home, recommend the following:  A lot of help with walking and/or transfers;A lot of help with bathing/dressing/bathroom;Assistance with cooking/housework;Assist for transportation;Help with stairs or ramp for entrance   Equipment Recommendations       Recommendations for Other Services      Precautions / Restrictions Precautions Precautions: Fall Recall of Precautions/Restrictions: Intact Precaution/Restrictions Comments: pt reported no other falls then on 02/05/24       Mobility Bed Mobility               General bed mobility comments: defer due to vitals    Transfers                   General transfer comment: deffer due to vitals     Balance                                            ADL either performed or assessed with clinical judgement   ADL Overall ADL's : Needs assistance/impaired     Grooming: Wash/dry face;Oral care;Set up;Bed level;Cueing for compensatory techniques Grooming Details (indicate cue type and reason): cues for initiation of rest breaks and education on the importance of implementation during ADLS PLB too                                    Extremity/Trunk Assessment              Vision       Perception     Praxis     Communication Communication Communication: Impaired Factors Affecting Communication: Hearing impaired   Cognition Arousal: Alert Behavior During Therapy: WFL for tasks assessed/performed Cognition: No apparent impairments                               Following commands: Intact        Cueing   Cueing Techniques: Verbal cues, Gestural cues  Exercises      Shoulder Instructions       General Comments  Pertinent Vitals/ Pain       Pain Assessment Pain Assessment: No/denies pain  Home Living                                          Prior Functioning/Environment              Frequency  Min 2X/week        Progress Toward Goals  OT Goals(current goals can now be found in the care plan section)  Progress towards OT goals: Progressing toward goals     Plan      Co-evaluation                 AM-PAC OT 6 Clicks Daily Activity     Outcome Measure   Help from another person eating meals?: A Little Help from another person taking care of personal grooming?: A Little Help from another person toileting, which includes using toliet, bedpan, or urinal?: A Lot Help from another person bathing (including washing, rinsing, drying)?: A Lot Help from another person to put on and taking off regular upper body clothing?: A Little Help from another person to put on and taking off regular lower body clothing?: A Lot 6 Click  Score: 15    End of Session    OT Visit Diagnosis: Unsteadiness on feet (R26.81);Other abnormalities of gait and mobility (R26.89);Muscle weakness (generalized) (M62.81)   Activity Tolerance Treatment limited secondary to medical complications (Comment);Other (comment) (elevated vital signs)   Patient Left in bed;with call bell/phone within reach;with bed alarm set   Nurse Communication Mobility status;Other (comment) (alerted rn of vital signs)        Time: 9185-9173 OT Time Calculation (min): 12 min  Charges: OT General Charges $OT Visit: 1 Visit OT Treatments $Self Care/Home Management : 8-22 mins  Randall, COTA/L Acute Rehabilitation [phone]   CHRISTELLA Nest Lorraine-COTA/L  03/21/2024, 8:26 AM

## 2024-03-21 NOTE — Progress Notes (Signed)
 Heart Failure Nurse Navigator Progress Note  PCP: Patient, No Pcp Per PCP-Cardiologist: Ozell Fell, MD Admission Diagnosis: None Admitted from: Home via POV  Presentation:   [name] Long is a 84 y.o. female.  She presented with shortness of breath, bilateral pitting edema. Recent fall on 02/05/2024. History of permanent atrial fibrillation, CHF, PAF, mitral and tricuspid valve regurgitation, Pulmonary HTN, and MGUS.  Chest x-ray no acute cardiopulmonary findings. HS-Troponin 41. Pro-BNP-6,184. Patient is from [place] and with her daughter [name] Long her in [place].     ECHO/ LVEF: 60-65%. 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    DM (diabetes mellitus) (HCC)    Dyspnea    Hypertension    Lymphedema    MGUS (monoclonal gammopathy of unknown significance)    Mitral regurgitation    Pedal edema    PVC (premature ventricular contraction)      Social History   Socioeconomic History   Marital status: Widowed    Spouse name: Not on file   Number of children: Not on file   Years of education: Not on file   Highest education level: Not on file  Occupational History   Not on file  Tobacco Use   Smoking status: Never   Smokeless tobacco: Never  Substance and Sexual Activity   Alcohol use: Never   Drug use: Never   Sexual activity: Not on file  Other Topics Concern   Not on file  Social History Narrative   Not on file   Social Drivers of Health   Tobacco Use: Low Risk (03/18/2024)   Patient History    Smoking Tobacco Use: Never    Smokeless Tobacco Use: Never    Passive Exposure: Not on file  Financial Resource Strain: Not on file  Food Insecurity: Not on file  Transportation Needs: Not on file  Physical Activity: Not on file  Stress: Not on file  Social Connections: Not on file  Depression (EYV7-0): Not on file  Alcohol Screen: Not on file   Housing: Not on file  Utilities: Not on file  Health Literacy: Not on file   Education Assessment and Provision: Daughter [name] Long was at the bedside for all CHF education and AHF TOC scheduling.  Detailed education and instructions provided on heart failure disease management including the following:  Signs and symptoms of Heart Failure When to call the physician Importance of daily weights Low sodium diet Fluid restriction Medication management Anticipated future follow-up appointments  Patient education given on each of the above topics.  Patient acknowledges understanding via teach back method and acceptance of all instructions.  Education Materials:  Living Better With Heart Failure Booklet, HF zone tool, & Daily Weight Tracker Tool.  Patient has scale at home: Yes Patient has pill box at home: Yes  High Risk Criteria for Readmission and/or Poor Patient Outcomes: Heart failure hospital admissions (last 6 months): 1  No Show rate: 0% Difficult social situation: None determined at this time. Demonstrates medication adherence: Yes Primary Language: English Literacy level: Reading, Writing & Comprehension  Barriers of Care:   Daily Weights- new to daily weights Diet & Fluid Restrictions- per patient and daughter she does not have a high sodium containing diet and does not go over 64 oz of fluid a day.  Considerations/Referrals:  Referral made to Heart Failure Pharmacist Stewardship: Yes Referral made to Heart Failure CSW/NCM  TOC: No Referral made to Heart & Vascular TOC clinic: Yes.  [place] 03/31/24 @ 8:15 AM  Items for Follow-up on DC/TOC: Daily Weights Diet & Fluid Restrictions Continued CHF education  Charmaine Pines, RN, BSN [facility] Heart Failure Navigator Secure Chat Only

## 2024-03-21 NOTE — NC FL2 (Signed)
 " [place]  MEDICAID FL2 LEVEL OF CARE FORM     IDENTIFICATION  Patient Name: [name] Long Birthdate: 1940-06-14 Sex: female Admission Date (Current Location): 03/18/2024  [facility] and [place] Number:  Producer, Television/film/video and Address:  [facility], [address] [place], [place] [address]      Provider Number: [id number]  Attending Physician Name and Address:  Danton Reyes DASEN, MD  Relative Name and Phone Number:       Current Level of Care: Hospital Recommended Level of Care: Skilled Nursing Facility Prior Approval Number:    Date Approved/Denied:   PASRR Number: [id number]  Discharge Plan: SNF    Current Diagnoses: Patient Active Problem List   Diagnosis Date Noted   SOB (shortness of breath) 03/18/2024   AKI (acute kidney injury)    Acute CHF (congestive heart failure) (HCC) 08/28/2017   Hypertensive emergency 08/28/2017   Obesity, Class III, BMI 40-49.9 (morbid obesity) (HCC) 08/28/2017   Hypokalemia 08/28/2017   Acute on chronic combined systolic and diastolic CHF (congestive heart failure) (HCC)    Persistent atrial fibrillation (HCC)    Essential hypertension    Stage 3 chronic kidney disease (HCC)    Hypercholesterolemia 12/17/2015   Routine health maintenance 07/23/2015   MGUS (monoclonal gammopathy of unknown significance) 07/31/2014    Orientation RESPIRATION BLADDER Height & Weight     Self, Time, Situation, Place  O2 (2L [place]) Incontinent Weight: 176 lb 9.4 oz (80.1 kg) Height:  5' 4 (162.6 cm)  BEHAVIORAL SYMPTOMS/MOOD NEUROLOGICAL BOWEL NUTRITION STATUS      Continent    AMBULATORY STATUS COMMUNICATION OF NEEDS Skin   Extensive Assist Verbally Normal                       Personal Care Assistance Level of Assistance  Bathing, Feeding, Dressing Bathing Assistance: Limited assistance Feeding assistance: Limited assistance Dressing Assistance: Limited assistance     Functional Limitations Info  Sight,  Hearing, Speech Sight Info: Adequate Hearing Info: Adequate Speech Info: Adequate    SPECIAL CARE FACTORS FREQUENCY  PT (By licensed PT), OT (By licensed OT)                    Contractures Contractures Info: Not present    Additional Factors Info  Code Status Code Status Info: FULL CODE             Current Medications (03/21/2024):  This is the current hospital active medication list Current Facility-Administered Medications  Medication Dose Route Frequency Provider Last Rate Last Admin   acetaminophen  (TYLENOL ) tablet 650 mg  650 mg Oral Q6H PRN Patel, Ekta V, MD       apixaban  (ELIQUIS ) tablet 5 mg  5 mg Oral BID Patel, Ekta V, MD   5 mg at 03/21/24 9192   brimonidine  (ALPHAGAN ) 0.2 % ophthalmic solution 1 drop  1 drop Both Eyes BID Tobie Mario GAILS, MD   1 drop at 03/20/24 2236   empagliflozin  (JARDIANCE ) tablet 10 mg  10 mg Oral Daily Patwardhan, Manish J, MD   10 mg at 03/21/24 9192   furosemide  (LASIX ) injection 80 mg  80 mg Intravenous BID Danton Reyes DASEN, MD   80 mg at 03/21/24 1206   ondansetron  (ZOFRAN ) injection 4 mg  4 mg Intravenous Q6H PRN McLean, Dalton S, MD       polyethylene glycol (MIRALAX  / GLYCOLAX ) packet 17 g  17 g Oral Daily PRN Tobie,  Mario GAILS, MD       potassium chloride  SA (KLOR-CON  M) CR tablet 40 mEq  40 mEq Oral Q4H Patwardhan, Manish J, MD       sacubitril -valsartan  (ENTRESTO ) 24-26 mg per tablet  1 tablet Oral BID Elmira Newman PARAS, MD   1 tablet at 03/21/24 9192   sodium chloride  flush (NS) 0.9 % injection 3 mL  3 mL Intravenous Q12H Tobie Mario GAILS, MD   3 mL at 03/21/24 0028     Discharge Medications: Please see discharge summary for a list of discharge medications.  Relevant Imaging Results:  Relevant Lab Results:   Additional Information SS# [id number]  Gwenn Julien [place], [place]     "

## 2024-03-21 NOTE — Plan of Care (Signed)
" °  Problem: Education: Goal: Knowledge of General Education information will improve Description: Including pain rating scale, medication(s)/side effects and non-pharmacologic comfort measures 03/21/2024 0624 by Si Leventhal, Andrea, RN Outcome: Progressing 03/21/2024 0530 by Si Leventhal, Jessicamarie Amiri, RN Outcome: Progressing   Problem: Health Behavior/Discharge Planning: Goal: Ability to manage health-related needs will improve Outcome: Progressing   Problem: Clinical Measurements: Goal: Ability to maintain clinical measurements within normal limits will improve 03/21/2024 0624 by Si Leventhal, Andrea, RN Outcome: Progressing 03/21/2024 0530 by Si Leventhal, Andrea, RN Outcome: Progressing Goal: Will remain free from infection 03/21/2024 0624 by Si Leventhal, Andrea, RN Outcome: Progressing 03/21/2024 0530 by Si Leventhal, Holmes Hays, RN Outcome: Progressing Goal: Diagnostic test results will improve Outcome: Progressing Goal: Respiratory complications will improve Outcome: Progressing Goal: Cardiovascular complication will be avoided Outcome: Progressing   Problem: Activity: Goal: Risk for activity intolerance will decrease Outcome: Progressing   Problem: Nutrition: Goal: Adequate nutrition will be maintained Outcome: Progressing   Problem: Coping: Goal: Level of anxiety will decrease Outcome: Progressing   Problem: Elimination: Goal: Will not experience complications related to bowel motility Outcome: Progressing Goal: Will not experience complications related to urinary retention Outcome: Progressing   Problem: Pain Managment: Goal: General experience of comfort will improve and/or be controlled Outcome: Progressing   Problem: Safety: Goal: Ability to remain free from injury will improve Outcome: Progressing   Problem: Skin Integrity: Goal: Risk for impaired skin integrity will decrease Outcome: Progressing   Problem: Education: Goal: Ability  to demonstrate management of disease process will improve Outcome: Progressing Goal: Ability to verbalize understanding of medication therapies will improve Outcome: Progressing Goal: Individualized Educational Video(s) Outcome: Progressing   Problem: Activity: Goal: Capacity to carry out activities will improve Outcome: Progressing   Problem: Cardiac: Goal: Ability to achieve and maintain adequate cardiopulmonary perfusion will improve Outcome: Progressing   Problem: Education: Goal: Understanding of CV disease, CV risk reduction, and recovery process will improve Outcome: Progressing Goal: Individualized Educational Video(s) Outcome: Progressing   Problem: Activity: Goal: Ability to return to baseline activity level will improve Outcome: Progressing   Problem: Cardiovascular: Goal: Ability to achieve and maintain adequate cardiovascular perfusion will improve Outcome: Progressing Goal: Vascular access site(s) Level 0-1 will be maintained Outcome: Progressing   Problem: Health Behavior/Discharge Planning: Goal: Ability to safely manage health-related needs after discharge will improve Outcome: Progressing   "

## 2024-03-21 NOTE — Progress Notes (Signed)
 "  [name] Long  FMW:[id number] DOB: 09-Sep-1940 DOA: 03/18/2024 PCP: Patient, No Pcp Per    Brief Narrative:  84 year old with a history of CHF, PAF, mitral and tricuspid valve regurgitation, Pulm HTN, DM, HTN, and MGUS who presented to the ER 1/13 with complaints of intermittent dizziness since early December leading to multiple accidental falls at home along with shortness of breath and bilateral lower extremity pitting edema.  CXR in the ED was without acute findings.  CT chest revealed cardiomegaly and a small pericardial effusion but no other acute findings.  Goals of Care:   Code Status: Full Code   DVT prophylaxis: SCD's Start: 03/20/24 0935 apixaban  (ELIQUIS ) tablet 5 mg   Interim Hx: No acute events recorded overnight.  Resting comfortably in bed.  Reports that she is feeling progressively better.  Denies chest pain or shortness of breath.  Assessment & Plan:  Pulmonary HTN with acute exacerbation - dizziness, bilateral lower extremity edema, DOE Cardiology following - diuresis ongoing with net negative balance of ~3.6L thus far - right heart cath 1/15 noted elevated right and left heart filling pressures with RV failure mildly out of proportion to LV failure with moderate mixed pulmonary venous and arterial hypertension but not markedly low cardiac output - recommendation from Cardiology is to continue diuresis   Permanent atrial fibrillation Rate controlled with some bradycardia - beta-blocker on hold - continue Eliquis   Nonrheumatic mitral valve, pulmonic valve, and tricuspid valve regurgitation Felt to be related to severe biatrial enlargement due to chronic A-fib and pulmonary hypertension   Recent fall Has a small left frontal scalp hematoma - CT head without acute findings - continue anticoagulation for now - PT/OT  Hyponatremia Due to volume overload - resolved with diuresis  CKD Felt to be cardiorenal - creatinine stable  Family Communication: No family present at  time of exam Disposition: Skilled Nursing-Short Term Rehab (<3 Hours/Day)03/20/2024 1200  Objective: Blood pressure 138/68, pulse 63, temperature 98.1 F (36.7 C), temperature source Oral, resp. rate 18, height 5' 4 (1.626 m), weight 80.1 kg, SpO2 93%.  Intake/Output Summary (Last 24 hours) at 03/21/2024 0831 Last data filed at 03/21/2024 0300 Gross per 24 hour  Intake --  Output 1700 ml  Net -1700 ml   Filed Weights   03/19/24 0500 03/20/24 0430 03/21/24 0300  Weight: 83.7 kg 80.8 kg 80.1 kg    Examination: General: No acute respiratory distress Lungs: Clear to auscultation bilaterally -no wheezing Cardiovascular: Regular rate and rhythm  Abdomen: NT/ND, soft, BS positive, no rebound Extremities: Chronic appearing 2+ edema bilateral lower extremities  CBC: Recent Labs  Lab 03/18/24 0107 03/18/24 1421 03/19/24 0318 03/20/24 0906  WBC 4.3  --  5.1  --   NEUTROABS  --   --  4.1  --   HGB 13.1 14.3 13.8 13.3  13.3  HCT 39.4 42.0 41.3 39.0  39.0  MCV 97.5  --  97.6  --   PLT 216  --  212  --    Basic Metabolic Panel: Recent Labs  Lab 03/18/24 0107 03/18/24 1421 03/18/24 1619 03/19/24 0318 03/20/24 0247 03/20/24 0906  NA 132*   < >  --  138 141 141  141  K 3.9   < >  --  3.5 4.3 3.6  3.6  CL 92*  --   --  95* 98  --   CO2 29  --   --  33* 38*  --   GLUCOSE 134*  --   --  100* 106*  --   BUN 31*  --   --  29* 29*  --   CREATININE 1.25*  --   --  1.14* 1.19*  --   CALCIUM  9.0  --   --  9.1 8.7*  --   MG  --   --  2.1 2.1 2.0  --   PHOS  --   --   --  3.5  --   --    < > = values in this interval not displayed.   GFR: Estimated Creatinine Clearance: 36.7 mL/min (A) (by C-G formula based on SCr of 1.19 mg/dL (H)).   Scheduled Meds:  apixaban   5 mg Oral BID   brimonidine   1 drop Both Eyes BID   empagliflozin   10 mg Oral Daily   sacubitril -valsartan   1 tablet Oral BID   sodium chloride  flush  3 mL Intravenous Q12H   sodium chloride  flush  3 mL  Intravenous Q12H     LOS: 3 days   Reyes IVAR Moores, MD [facility] Office  [phone] Pager - Text Page per Tracey  If 7PM-7AM, please contact night-coverage per Amion 03/21/2024, 8:31 AM     "

## 2024-03-21 NOTE — TOC Transition Note (Deleted)
 Transition of Care [facility]) - Discharge Note   Patient Details  Name: [name] Long MRN: [id number] Date of Birth: 03/01/1941  [facility]) CM/SW Contact:  Gwenn Julien [place], [place] Phone Number: 03/21/2024, 1:01 PM   Clinical Narrative:  Spoke with pt and pt's dtr re PT recommendation for SNF. Pt with no prior SNF stay. Reviewed SNF placement process and answered questions. Pt and dtr report agreeable to SNF for STR. Will f/u with offer once available.   Teylor Wolven, MSW, LCSW [phone] (coverage)       Final next level of care: Skilled Nursing Facility Barriers to Discharge: Continued Medical Work up, SNF Pending bed offer, Insurance Authorization   Patient Goals and CMS Choice            Discharge Placement                       Discharge Plan and Services Additional resources added to the After Visit Summary for       Post Acute Care Choice: Skilled Nursing Facility                               Social Drivers of Health (SDOH) Interventions SDOH Screenings   Tobacco Use: Low Risk (03/18/2024)     Readmission Risk Interventions    03/19/2024    3:29 PM  Readmission Risk Prevention Plan  Post Dischage Appt Complete  Medication Screening Complete  Transportation Screening Complete

## 2024-03-22 DIAGNOSIS — R0602 Shortness of breath: Secondary | ICD-10-CM | POA: Diagnosis not present

## 2024-03-22 LAB — BASIC METABOLIC PANEL WITH GFR
Anion gap: 4 — ABNORMAL LOW (ref 5–15)
BUN: 25 mg/dL — ABNORMAL HIGH (ref 8–23)
CO2: 42 mmol/L — ABNORMAL HIGH (ref 22–32)
Calcium: 8.4 mg/dL — ABNORMAL LOW (ref 8.9–10.3)
Chloride: 95 mmol/L — ABNORMAL LOW (ref 98–111)
Creatinine, Ser: 1.22 mg/dL — ABNORMAL HIGH (ref 0.44–1.00)
GFR, Estimated: 44 mL/min — ABNORMAL LOW
Glucose, Bld: 94 mg/dL (ref 70–99)
Potassium: 4.7 mmol/L (ref 3.5–5.1)
Sodium: 140 mmol/L (ref 135–145)

## 2024-03-22 LAB — MAGNESIUM: Magnesium: 1.9 mg/dL (ref 1.7–2.4)

## 2024-03-22 NOTE — Plan of Care (Signed)
   Problem: Clinical Measurements: Goal: Ability to maintain clinical measurements within normal limits will improve Outcome: Progressing

## 2024-03-22 NOTE — Progress Notes (Signed)
 "  Adaiah Jaskot  FMW:[id number] DOB: Apr 20, 1940 DOA: 03/18/2024 PCP: Patient, No Pcp Per    Brief Narrative:  84 year old with a history of CHF, PAF, mitral and tricuspid valve regurgitation, Pulm HTN, DM, HTN, and MGUS who presented to the ER 1/13 with complaints of intermittent dizziness since early December leading to multiple accidental falls at home along with shortness of breath and bilateral lower extremity pitting edema.  CXR in the ED was without acute findings.  CT chest revealed cardiomegaly and a small pericardial effusion but no other acute findings.  Goals of Care:   Code Status: Full Code   DVT prophylaxis: SCD's Start: 03/20/24 0935 apixaban  (ELIQUIS ) tablet 5 mg   Interim Hx: No acute events recorded overnight.  Afebrile.  Blood pressure elevated with systolics 143-157.  Vitals otherwise stable.  In good spirits.  Resting comfortably in bed.  No new complaints today.  Assessment & Plan:  Pulmonary HTN (mixed venous and arterial) with acute exacerbation - dizziness, bilateral lower extremity edema, DOE Cardiology following - diuresis ongoing with net negative balance of ~3.7L thus far - right heart cath 1/15 noted elevated right and left heart filling pressures with RV failure mildly out of proportion to LV failure with moderate mixed pulmonary venous and arterial hypertension but not markedly low cardiac output - recommendation from Cardiology is to continue diuresis   Permanent atrial fibrillation Rate controlled with some bradycardia - beta-blocker on hold with improvement in resting heart rate - continue Eliquis   Nonrheumatic mitral valve, pulmonic valve, and tricuspid valve regurgitation Felt to be related to severe biatrial enlargement due to chronic A-fib and pulmonary hypertension   Recent fall Has a small left frontal scalp hematoma - CT head without acute findings - continue anticoagulation for now - PT/OT  Hyponatremia Due to volume overload - resolved with  diuresis  CKD Felt to be cardiorenal - creatinine stable  Recent Labs  Lab 03/18/24 0107 03/19/24 0318 03/20/24 0247 03/21/24 1007 03/22/24 0240  CREATININE 1.25* 1.14* 1.19* 1.18* 1.22*     Family Communication: No family present at time of exam Disposition: Skilled Nursing-Short Term Rehab (<3 Hours/Day)03/20/2024 1200  Objective: Blood pressure (!) 157/78, pulse 75, temperature 97.9 F (36.6 C), temperature source Oral, resp. rate 16, height 5' 4 (1.626 m), weight 78.8 kg, SpO2 100%.  Intake/Output Summary (Last 24 hours) at 03/22/2024 0814 Last data filed at 03/21/2024 1900 Gross per 24 hour  Intake 240 ml  Output 400 ml  Net -160 ml   Filed Weights   03/20/24 0430 03/21/24 0300 03/22/24 0300  Weight: 80.8 kg 80.1 kg 78.8 kg    Examination: General: No acute respiratory distress Lungs: Clear to auscultation bilaterally -no wheezing Cardiovascular: Regular rate and rhythm  Abdomen: NT/ND, soft, BS positive, no rebound Extremities: Chronic appearing 2+ edema bilateral lower extremities with some wrinkling appreciable suggesting decreasing size  CBC: Recent Labs  Lab 03/18/24 0107 03/18/24 1421 03/19/24 0318 03/20/24 0906  WBC 4.3  --  5.1  --   NEUTROABS  --   --  4.1  --   HGB 13.1 14.3 13.8 13.3  13.3  HCT 39.4 42.0 41.3 39.0  39.0  MCV 97.5  --  97.6  --   PLT 216  --  212  --    Basic Metabolic Panel: Recent Labs  Lab 03/19/24 0318 03/20/24 0247 03/20/24 0906 03/21/24 1007 03/22/24 0240  NA 138 141 141  141 137 140  K 3.5 4.3 3.6  3.6 3.3*  4.7  CL 95* 98  --  94* 95*  CO2 33* 38*  --  36* 42*  GLUCOSE 100* 106*  --  192* 94  BUN 29* 29*  --  26* 25*  CREATININE 1.14* 1.19*  --  1.18* 1.22*  CALCIUM  9.1 8.7*  --  8.4* 8.4*  MG 2.1 2.0  --   --  1.9  PHOS 3.5  --   --   --   --    GFR: Estimated Creatinine Clearance: 35.5 mL/min (A) (by C-G formula based on SCr of 1.22 mg/dL (H)).   Scheduled Meds:  apixaban   5 mg Oral BID    brimonidine   1 drop Both Eyes QHS   empagliflozin   10 mg Oral Daily   furosemide   80 mg Intravenous BID   latanoprost   1 drop Both Eyes QHS   sacubitril -valsartan   1 tablet Oral BID   sodium chloride  flush  3 mL Intravenous Q12H     LOS: 4 days   Reyes IVAR Moores, MD [facility] Office  [phone] Pager - Text Page per Tracey  If 7PM-7AM, please contact night-coverage per Amion 03/22/2024, 8:14 AM     "

## 2024-03-22 NOTE — Plan of Care (Signed)

## 2024-03-22 NOTE — Progress Notes (Signed)
 "   Patient Name: [name] Long Date of Encounter: 03/22/2024 [facility] Cardiologist: Ozell Fell, MD   Interval Summary  .    Continued improvement in leg swelling  Vital Signs .    Vitals:   03/21/24 1615 03/21/24 1900 03/21/24 2300 03/22/24 0300  BP: (!) 142/59 (!) 143/73 (!) 153/91 (!) 157/78  Pulse: 77 67 72 75  Resp: (!) 27 (!) 21 18 16   Temp: 98.1 F (36.7 C) 98.2 F (36.8 C) 98.3 F (36.8 C) 97.9 F (36.6 C)  TempSrc: Oral Oral Oral Oral  SpO2: 100% 100% 100%   Weight:    78.8 kg  Height:    5' 4 (1.626 m)    Intake/Output Summary (Last 24 hours) at 03/22/2024 0738 Last data filed at 03/21/2024 1900 Gross per 24 hour  Intake 240 ml  Output 400 ml  Net -160 ml      03/22/2024    3:00 AM 03/21/2024    3:00 AM 03/20/2024    4:30 AM  Last 3 Weights  Weight (lbs) 173 lb 11.6 oz 176 lb 9.4 oz 178 lb 2.1 oz  Weight (kg) 78.8 kg 80.1 kg 80.8 kg      Telemetry/ECG    Telemetry 03/21/2024- Personally Reviewed A-fib with rates in 70s  RHC 03/20/2024: 1. Elevated right and left heart filling pressures.  RV failure seems mildly out of proportion to LV failure.  2. Preserved PAPi 3. 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  4. Low but not markedly low cardiac output.    Would continue diuresis, Lasix  increased to 80 mg IV bid.   Echocardiogram 03/19/2024: 1. Left ventricular ejection fraction, by estimation, is 60 to 65%. The  left ventricle has normal function. The left ventricle has no regional  wall motion abnormalities. There is moderate left ventricular hypertrophy  of the basal-septal segment. Left ventricular diastolic parameters are indeterminate.  Elevated left atrial pressure.   2. Right ventricular systolic function is mildly reduced. The right  ventricular size is mildly enlarged. There is moderately elevated  pulmonary artery systolic pressure.   3. Left atrial size was severely dilated.   4. Right atrial size was  severely dilated.   5. A small pericardial effusion is present.   6. The mitral valve is normal in structure. Mild to moderate mitral valve  regurgitation. No evidence of mitral stenosis.   7. Tricuspid valve regurgitation is moderate to severe.   8. The aortic valve is tricuspid. Aortic valve regurgitation is not  visualized. Aortic valve sclerosis/calcification is present, without any  evidence of aortic stenosis.   9. Pulmonic valve regurgitation is moderate.  10. The inferior vena cava is dilated in size with <50% respiratory  variability, suggesting right atrial pressure of 15 mmHg.   Physical Exam .   Physical Exam Vitals and nursing note reviewed.  Constitutional:      General: She is not in acute distress. Neck:     Vascular: JVD present.  Cardiovascular:     Rate and Rhythm: Normal rate and regular rhythm.     Heart sounds: Normal heart sounds. No murmur heard. Pulmonary:     Effort: No accessory muscle usage.     Breath sounds: No wheezing or rales.  Musculoskeletal:     Right lower leg: Edema (1+) present.     Left lower leg: Edema (1+, dressing on bleb on left leg) present.   RLE > LLE   Assessment & Plan .  84 y.o. female with permanent A-fib, longstanding moderate to severe mitral and tricuspid regurgitation, pulmonary hypertension, now admitted with decompensated RV failure   Acute on chronic HFpEF, right-sided heart failure: Primarily right heart failure in the setting of longstanding A-fib. RHC showed  1. Elevated right and left heart filling pressures.  RV failure seems mildly out of proportion to LV failure.  2. Preserved PAPi 3. 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  Net -3.6 L.  Diuresing and improving very well on IV Lasix  80 mg twice daily.  Given the renal function is tolerating, would continue same dose since she continues to have +JVD.  Depending on clinical improvement and urine output, can transition to oral Lasix  40  mg twice daily prior to discharge. Anticipate she will need another 1-2 days of in-hospital diuresis.  Permanent atrial fibrillation: Continue Lasix  5 mg twice daily.  For questions or updates, please contact [facility] Please consult [email or web address] for contact info under        Signed, Eulas FORBES Furbish, MD   "

## 2024-03-22 NOTE — TOC Progression Note (Signed)
 Transition of Care [facility]) - Progression Note    Patient Details  Name: [name] Long MRN: [id number] Date of Birth: 01/21/1941  [facility] CM/SW Contact  Hartley KATHEE Robertson, LCSWA Phone Number: 03/22/2024, 11:56 AM  Clinical Narrative:     CSW provided bed offers to pt and family members at bedside.   Expected Discharge Plan: Skilled Nursing Facility Barriers to Discharge: Continued Medical Work up, SNF Pending bed offer, [employer]               Expected Discharge Plan and Services     Post Acute Care Choice: Skilled Nursing Facility Living arrangements for the past 2 months: Single Family Home                                       Social Drivers of Health (SDOH) Interventions SDOH Screenings   Tobacco Use: Low Risk (03/18/2024)    Readmission Risk Interventions    03/19/2024    3:29 PM  Readmission Risk Prevention Plan  Post Dischage Appt Complete  Medication Screening Complete  Transportation Screening Complete

## 2024-03-23 DIAGNOSIS — R0602 Shortness of breath: Secondary | ICD-10-CM | POA: Diagnosis not present

## 2024-03-23 LAB — BASIC METABOLIC PANEL WITH GFR
Anion gap: 6 (ref 5–15)
BUN: 29 mg/dL — ABNORMAL HIGH (ref 8–23)
CO2: 42 mmol/L — ABNORMAL HIGH (ref 22–32)
Calcium: 8.5 mg/dL — ABNORMAL LOW (ref 8.9–10.3)
Chloride: 94 mmol/L — ABNORMAL LOW (ref 98–111)
Creatinine, Ser: 1.22 mg/dL — ABNORMAL HIGH (ref 0.44–1.00)
GFR, Estimated: 44 mL/min — ABNORMAL LOW
Glucose, Bld: 105 mg/dL — ABNORMAL HIGH (ref 70–99)
Potassium: 4.4 mmol/L (ref 3.5–5.1)
Sodium: 142 mmol/L (ref 135–145)

## 2024-03-23 LAB — LIPOPROTEIN A (LPA): Lipoprotein (a): 179.4 nmol/L — ABNORMAL HIGH

## 2024-03-23 NOTE — Progress Notes (Signed)
 "  [name] Long  FMW:[id number] DOB: [birth year] DOA: 03/18/2024 PCP: Patient, No Pcp Per    Brief Narrative:  84 year old with a history of CHF, PAF, mitral and tricuspid valve regurgitation, Pulm HTN, DM, HTN, and MGUS who presented to the ER 1/13 with complaints of intermittent dizziness since early December leading to multiple accidental falls at home along with shortness of breath and bilateral lower extremity pitting edema.  CXR in the ED was without acute findings.  CT chest revealed cardiomegaly and a small pericardial effusion but no other acute findings.  Goals of Care:   Code Status: Full Code   DVT prophylaxis: SCD's Start: 03/20/24 0935 apixaban  (ELIQUIS ) tablet 5 mg   Interim Hx: No acute events recorded overnight.  Afebrile.  Systolic blood pressure remains elevated at 143-188.  Creatinine holding steady at 1.22.  Resting comfortably in bed.  No complaints.  Quite pleasant.  Assessment & Plan:  Pulmonary HTN (mixed venous and arterial) with acute exacerbation - dizziness, bilateral lower extremity edema, DOE Cardiology following - diuresis ongoing with net negative balance of ~5.5L thus far - right heart cath 1/15 noted elevated right and left heart filling pressures with RV failure mildly out of proportion to LV failure with moderate mixed pulmonary venous and arterial hypertension but not markedly low cardiac output - recommendation from Cardiology is to continue diuresis   Permanent atrial fibrillation Rate controlled with some bradycardia - beta-blocker on hold with improvement in resting heart rate - continue Eliquis   HTN BP above goal despite ongoing diuresis - unable to use BB due to modest bradycardia - no ACEi/ARB as pt is on Entresto  already - follow w/o change in medical tx for now, hoping for improved BP with ongoing diuresis   Nonrheumatic mitral valve, pulmonic valve, and tricuspid valve regurgitation Felt to be related to severe biatrial enlargement due to chronic  A-fib and pulmonary hypertension   Recent fall Has a small left frontal scalp hematoma - CT head without acute findings - continue anticoagulation for now - PT/OT  Hyponatremia Due to volume overload - resolved with diuresis  CKD Felt to be cardiorenal - creatinine stable  Recent Labs  Lab 03/19/24 0318 03/20/24 0247 03/21/24 1007 03/22/24 0240 03/23/24 0251  CREATININE 1.14* 1.19* 1.18* 1.22* 1.22*     Family Communication: No family present at time of exam Disposition: Skilled Nursing-Short Term Rehab (<3 Hours/Day)03/20/2024 1200 -anticipate discharge 1/19  Objective: Blood pressure (!) 188/76, pulse (!) 55, temperature 97.7 F (36.5 C), temperature source Oral, resp. rate 18, height 5' 4 (1.626 m), weight 73.8 kg, SpO2 100%.  Intake/Output Summary (Last 24 hours) at 03/23/2024 0825 Last data filed at 03/23/2024 0340 Gross per 24 hour  Intake 460 ml  Output 2300 ml  Net -1840 ml   Filed Weights   03/21/24 0300 03/22/24 0300 03/23/24 0403  Weight: 80.1 kg 78.8 kg 73.8 kg    Examination: General: No acute respiratory distress Lungs: Clear to auscultation bilaterally Cardiovascular: Regular rate and rhythm  Abdomen: NT/ND, soft, BS positive, no rebound Extremities: Chronic appearing 2+ edema bilateral lower extremities with some slow ongoing improvement  CBC: Recent Labs  Lab 03/18/24 0107 03/18/24 1421 03/19/24 0318 03/20/24 0906  WBC 4.3  --  5.1  --   NEUTROABS  --   --  4.1  --   HGB 13.1 14.3 13.8 13.3  13.3  HCT 39.4 42.0 41.3 39.0  39.0  MCV 97.5  --  97.6  --   PLT 216  --  212  --    Basic Metabolic Panel: Recent Labs  Lab 03/19/24 0318 03/20/24 0247 03/20/24 0906 03/21/24 1007 03/22/24 0240 03/23/24 0251  NA 138 141   < > 137 140 142  K 3.5 4.3   < > 3.3* 4.7 4.4  CL 95* 98  --  94* 95* 94*  CO2 33* 38*  --  36* 42* 42*  GLUCOSE 100* 106*  --  192* 94 105*  BUN 29* 29*  --  26* 25* 29*  CREATININE 1.14* 1.19*  --  1.18* 1.22*  1.22*  CALCIUM  9.1 8.7*  --  8.4* 8.4* 8.5*  MG 2.1 2.0  --   --  1.9  --   PHOS 3.5  --   --   --   --   --    < > = values in this interval not displayed.   GFR: Estimated Creatinine Clearance: 34.4 mL/min (A) (by C-G formula based on SCr of 1.22 mg/dL (H)).   Scheduled Meds:  apixaban   5 mg Oral BID   brimonidine   1 drop Both Eyes QHS   empagliflozin   10 mg Oral Daily   furosemide   80 mg Intravenous BID   latanoprost   1 drop Both Eyes QHS   sacubitril -valsartan   1 tablet Oral BID   sodium chloride  flush  3 mL Intravenous Q12H     LOS: 5 days   Reyes IVAR Moores, MD [facility] Office  [phone] Pager - Text Page per Tracey  If 7PM-7AM, please contact night-coverage per Amion 03/23/2024, 8:25 AM     "

## 2024-03-23 NOTE — Plan of Care (Signed)
  Problem: Education: Goal: Knowledge of General Education information will improve Description: Including pain rating scale, medication(s)/side effects and non-pharmacologic comfort measures Outcome: Progressing   Problem: Clinical Measurements: Goal: Respiratory complications will improve Outcome: Progressing   Problem: Clinical Measurements: Goal: Cardiovascular complication will be avoided Outcome: Progressing   

## 2024-03-23 NOTE — Progress Notes (Signed)
 "   Patient Name: [name] Long Date of Encounter: 03/23/2024 [facility] Cardiologist: Ozell Fell, MD   Interval Summary  .    Continued improvement in leg swelling  Vital Signs .    Vitals:   03/23/24 0337 03/23/24 0403 03/23/24 0723 03/23/24 1110  BP: (!) 157/66  (!) 188/76 (!) 155/71  Pulse:   (!) 55 100  Resp: 17  18 18   Temp: 98.3 F (36.8 C)  97.7 F (36.5 C) 97.7 F (36.5 C)  TempSrc: Oral  Oral Oral  SpO2: 100%  100% 90%  Weight:  73.8 kg    Height:        Intake/Output Summary (Last 24 hours) at 03/23/2024 1122 Last data filed at 03/23/2024 9147 Gross per 24 hour  Intake 340 ml  Output 2300 ml  Net -1960 ml      03/23/2024    4:03 AM 03/22/2024    3:00 AM 03/21/2024    3:00 AM  Last 3 Weights  Weight (lbs) 162 lb 11.2 oz 173 lb 11.6 oz 176 lb 9.4 oz  Weight (kg) 73.8 kg 78.8 kg 80.1 kg      Telemetry/ECG    Telemetry 03/21/2024- Personally Reviewed A-fib with rates in 70s  RHC 03/20/2024: 1. Elevated right and left heart filling pressures.  RV failure seems mildly out of proportion to LV failure.  2. Preserved PAPi 3. 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  4. Low but not markedly low cardiac output.    Would continue diuresis, Lasix  increased to 80 mg IV bid.   Echocardiogram 03/19/2024: 1. Left ventricular ejection fraction, by estimation, is 60 to 65%. The  left ventricle has normal function. The left ventricle has no regional  wall motion abnormalities. There is moderate left ventricular hypertrophy  of the basal-septal segment. Left ventricular diastolic parameters are indeterminate.  Elevated left atrial pressure.   2. Right ventricular systolic function is mildly reduced. The right  ventricular size is mildly enlarged. There is moderately elevated  pulmonary artery systolic pressure.   3. Left atrial size was severely dilated.   4. Right atrial size was severely dilated.   5. A small pericardial effusion  is present.   6. The mitral valve is normal in structure. Mild to moderate mitral valve  regurgitation. No evidence of mitral stenosis.   7. Tricuspid valve regurgitation is moderate to severe.   8. The aortic valve is tricuspid. Aortic valve regurgitation is not  visualized. Aortic valve sclerosis/calcification is present, without any  evidence of aortic stenosis.   9. Pulmonic valve regurgitation is moderate.  10. The inferior vena cava is dilated in size with <50% respiratory  variability, suggesting right atrial pressure of 15 mmHg.   Physical Exam .   Physical Exam Vitals and nursing note reviewed.  Constitutional:      General: She is not in acute distress. Neck:     Vascular: JVD present.  Cardiovascular:     Rate and Rhythm: Normal rate and regular rhythm.     Heart sounds: Normal heart sounds. No murmur heard. Pulmonary:     Effort: No accessory muscle usage.     Breath sounds: No wheezing or rales.  Musculoskeletal:     Right lower leg: Edema (1+) present.     Left lower leg: Edema (1+, dressing on bleb on left leg) present.   RLE > LLE   Assessment & Plan .     84 y.o. female with permanent A-fib, longstanding moderate  to severe mitral and tricuspid regurgitation, pulmonary hypertension, now admitted with decompensated RV failure   Acute on chronic HFpEF, right-sided heart failure: Primarily right heart failure in the setting of longstanding A-fib. RHC showed  1. Elevated right and left heart filling pressures.  RV failure seems mildly out of proportion to LV failure.  2. Preserved PAPi 3. 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  Net -3.6 L.  Diuresing and improving very well on IV Lasix  80 mg twice daily.  Given the renal function is tolerating, would continue same dose since she continues to have +JVD.  Depending on clinical improvement and urine output, can transition to oral Lasix  40 mg twice daily prior to discharge. Anticipate she  will need another 1-2 days of in-hospital diuresis. -- no change in plan today. Cr remains stable  Permanent atrial fibrillation: Continue Lasix  5 mg twice daily.  For questions or updates, please contact [facility] Please consult [email or web address] for contact info under        Signed, Eulas FORBES Furbish, MD   "

## 2024-03-24 LAB — BASIC METABOLIC PANEL WITH GFR
Anion gap: 5 (ref 5–15)
BUN: 30 mg/dL — ABNORMAL HIGH (ref 8–23)
CO2: 41 mmol/L — ABNORMAL HIGH (ref 22–32)
Calcium: 8.5 mg/dL — ABNORMAL LOW (ref 8.9–10.3)
Chloride: 90 mmol/L — ABNORMAL LOW (ref 98–111)
Creatinine, Ser: 1.28 mg/dL — ABNORMAL HIGH (ref 0.44–1.00)
GFR, Estimated: 41 mL/min — ABNORMAL LOW
Glucose, Bld: 107 mg/dL — ABNORMAL HIGH (ref 70–99)
Potassium: 4.5 mmol/L (ref 3.5–5.1)
Sodium: 136 mmol/L (ref 135–145)

## 2024-03-24 MED ORDER — POLYETHYLENE GLYCOL 3350 17 G PO PACK: 17.0000 g | PACK | Freq: Every day | ORAL | Status: DC | PRN

## 2024-03-24 MED ORDER — SACUBITRIL-VALSARTAN 49-51 MG PO TABS
1.0000 | ORAL_TABLET | Freq: Two times a day (BID) | ORAL | Status: DC
  Administered 2024-03-24: 1 via ORAL
  Filled 2024-03-24: qty 1

## 2024-03-24 MED ORDER — FUROSEMIDE 40 MG PO TABS
80.0000 mg | ORAL_TABLET | Freq: Two times a day (BID) | ORAL | Status: DC
  Administered 2024-03-24: 80 mg via ORAL
  Filled 2024-03-24: qty 2

## 2024-03-24 MED ORDER — ACETAMINOPHEN 325 MG PO TABS: 650.0000 mg | ORAL_TABLET | Freq: Four times a day (QID) | ORAL | Status: DC | PRN

## 2024-03-24 MED ORDER — FUROSEMIDE 80 MG PO TABS: 80.0000 mg | ORAL_TABLET | Freq: Two times a day (BID) | ORAL | Status: DC

## 2024-03-24 MED ORDER — SACUBITRIL-VALSARTAN 49-51 MG PO TABS: 1.0000 | ORAL_TABLET | Freq: Two times a day (BID) | ORAL | Status: DC

## 2024-03-24 NOTE — Progress Notes (Signed)
 The patient is waiting on insurance approval.Thanks Alt[employer]

## 2024-03-24 NOTE — Discharge Summary (Signed)
 "  DISCHARGE SUMMARY  Fabienne Nolasco  MR#: [id number]  DOB:[birth year]  Date of Admission: 03/18/2024 Date of Discharge: 03/24/2024  Attending Physician:Daquawn Seelman ONEIDA Moores, MD  Patient's ERE:[place], No Pcp Per  Disposition: D/C home   Follow-up Appts:  Follow-up Information     Wonda Sharper, MD Follow up.   Specialty: Cardiology Contact information: [address] [place] [address] [phone]         Daneen Damien BROCKS, NP Follow up.   Specialties: Cardiology, Family Medicine Why: TIME : 9:15 AM  DATE FEBRUARY 23 , 2026 MONDAY  PLEASE BRING ALL CURRENT MEDICATION,ID and INS CARD Contact information: [address] [place] [address] [phone]         [facility]. Go on 03/31/2024.   Specialty: Cardiology Why: Hospital Follow-Up 03/31/24 @ 8:15 [facility] Entrance C off of [address] Please bring all medications to follow-up appointment Free Valet Parking at the door or use Gate Code [phone] to park under the building. Contact information: 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 CXR in the ED was without acute findings. CT chest  revealed cardiomegaly and a small pericardial effusion but no other acute findings.   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 - dizziness, bilateral lower extremity edema, DOE Cardiology followed during admission - diuresis effective with net negative balance of ~7.5L - right heart cath 1/15 noted elevated right and left heart filling pressures with RV failure mildly out of proportion to LV failure with moderate mixed pulmonary venous and arterial hypertension but not markedly low cardiac output - recommendation from Cardiology is to continue diuresis w/ transition to oral diuretic at time of dc/    Permanent atrial fibrillation Rate controlled with some bradycardia - beta-blocker discontinued with improvement in resting heart rate - continue Eliquis    HTN BP control improved w/ ongoing diuresis - unable to use BB due to bradycardia - no ACEi/ARB as pt is on Entresto    Nonrheumatic mitral valve, pulmonic valve, and tricuspid valve regurgitation Felt to be related to severe biatrial enlargement due to chronic A-fib and pulmonary hypertension    Recent fall Has a small left frontal scalp hematoma - CT head without acute findings - continue anticoagulation - PT/OT recommend SNF rehab stay and patient agrees    Hyponatremia Due to volume overload - resolved with diuresis   CKD Felt to be cardiorenal - creatinine slowly trending upward w/ ongoing diuresis - changed to oral diuretic 1/19 and lower dose - recheck crt in 3-5 days    Allergies as of 03/24/2024   No Known Allergies      Medication List     STOP taking these medications    carvedilol  25 MG tablet Commonly known as:  COREG    Entresto  97-103 MG Generic drug: sacubitril -valsartan  Replaced by: sacubitril -valsartan  49-51 MG   isosorbide -hydrALAZINE  20-37.5 MG tablet Commonly known as: BIDIL        TAKE these medications    acetaminophen  325 MG tablet Commonly known as:  TYLENOL  Take 2 tablets (650 mg total) by mouth every 6 (six) hours as needed for mild pain (pain score 1-3) or fever (or Fever >/= 101).   apixaban  5 MG Tabs tablet Commonly known as: ELIQUIS  Take 5 mg by mouth 2 (two) times daily.   brimonidine  0.2 % ophthalmic solution Commonly known as: ALPHAGAN  Place 1 drop into both eyes 2 (two) times daily.   furosemide  80 MG tablet Commonly known as: LASIX  Take 1 tablet (80 mg total) by mouth 2 (two) times daily. What changed: when to take this   Jardiance  10 MG Tabs tablet Generic drug: empagliflozin  Take 10 mg by mouth in the morning.   latanoprost  0.005 % ophthalmic solution Commonly known as: XALATAN  Place 1 drop into both eyes at bedtime.   polyethylene glycol 17 g packet Commonly known as: MIRALAX  / GLYCOLAX  Take 17 g by mouth daily as needed for mild constipation, moderate constipation or severe constipation.   sacubitril -valsartan  49-51 MG Commonly known as: ENTRESTO  Take 1 tablet by mouth 2 (two) times daily. Replaces: Entresto  97-103 MG        Day of Discharge BP (!) 147/64 (BP Location: Left Arm)   Pulse (!) 55   Temp 98.1 F (36.7 C) (Oral)   Resp 20   Ht 5' 4 (1.626 m)   Wt 72.4 kg   SpO2 100%   BMI 27.40 kg/m   Physical Exam: General: No acute respiratory distress Lungs: Clear to auscultation bilaterally without wheezes or crackles Cardiovascular: Regular rate and rhythm without murmur  Abdomen: Nontender, nondistended, soft, bowel sounds positive, no rebound Extremities: chronic B LE edema at 2+ (improved from 3+ at time of admit)  Basic Metabolic Panel: Recent Labs  Lab 03/18/24 1619 03/19/24 0318 03/20/24 0247 03/20/24 0906 03/21/24 1007 03/22/24 0240 03/23/24 0251 03/24/24 0238  NA  --  138 141 141  141 137 140 142 136  K  --  3.5 4.3 3.6  3.6 3.3* 4.7 4.4 4.5  CL  --  95* 98  --  94* 95* 94* 90*  CO2  --  33* 38*  --  36* 42* 42* 41*  GLUCOSE  --  100* 106*  --  192* 94 105* 107*   BUN  --  29* 29*  --  26* 25* 29* 30*  CREATININE  --  1.14* 1.19*  --  1.18* 1.22* 1.22* 1.28*  CALCIUM   --  9.1 8.7*  --  8.4* 8.4* 8.5* 8.5*  MG 2.1 2.1 2.0  --   --  1.9  --   --   PHOS  --  3.5  --   --   --   --   --   --     CBC: Recent Labs  Lab 03/18/24 0107 03/18/24 1421 03/19/24 0318 03/20/24 0906  WBC 4.3  --  5.1  --   NEUTROABS  --   --  4.1  --   HGB 13.1 14.3 13.8 13.3  13.3  HCT 39.4 42.0 41.3 39.0  39.0  MCV 97.5  --  97.6  --   PLT 216  --  212  --     Time spent in discharge (includes decision making & examination of pt): 35  minutes  03/24/2024, 8:25 AM   Reyes IVAR Moores, MD [facility] Office  [phone]      "

## 2024-03-24 NOTE — Plan of Care (Signed)

## 2024-03-24 NOTE — Progress Notes (Signed)
 Physical Therapy Treatment Patient Details Name: [name] Long MRN: [id number] DOB: 1940/04/07 Today's Date: 03/24/2024   History of Present Illness Pt is a 84 y.o. female who presented 03/18/24 due to SOB and LE edema. Pt also reported being dizzy since 02/05/24 and had a fall. IMG shows hematoma on head CT. PMH: CHF, afib,  of  congestive heart failure, permanent atrial fibrillation, and valvular heart disease mitral and tricuspid regurgitation    PT Comments  Pt resting in bed, agreeable to get out of bed to chair and reports I sat up in that chair all day yesterday and it felt great!SABRA MOD A to move to sit at EOB, some dizziness reported which resolved with seated rest. Stands at MOD A, increased posterior lean noted with assistance to maintain balance. Requires VC for sequencing with transfers including hand placement, posture, and sequencing. LOB noted when taking steps to the recliner, MOD A to assist with balance and for eccentric control to lower into recliner. Pt tolerated session on RA, spO2 90-95% with no chest pain or SOB noted. Educated and reviewed PLB with pt.  MD rounding on pt during session, hopefully for discharge today.   If plan is discharge home, recommend the following: A lot of help with walking and/or transfers;A lot of help with bathing/dressing/bathroom;Assistance with cooking/housework;Assist for transportation;Help with stairs or ramp for entrance   Can travel by private vehicle     No  Equipment Recommendations  Wheelchair (measurements PT);Wheelchair cushion (measurements PT);Hospital bed    Recommendations for Other Services       Precautions / Restrictions Precautions Precautions: Fall Recall of Precautions/Restrictions: Intact Precaution/Restrictions Comments: pt reported no other falls then on 02/05/24 Restrictions Weight Bearing Restrictions Per Provider Order: No     Mobility  Bed Mobility Overal bed mobility: Needs Assistance Bed Mobility: Supine to  Sit     Supine to sit: HOB elevated, Used rails, Mod assist     General bed mobility comments: MOD A with VC for sequencing and to assist bring hips towards EOB. Dizziness reported with initial sitting, resolved with rest break at EOB    Transfers Overall transfer level: Needs assistance Equipment used: Rolling walker (2 wheels) Transfers: Sit to/from Stand, Bed to chair/wheelchair/BSC Sit to Stand: Mod assist   Step pivot transfers: Mod assist       General transfer comment: MOD A for sit to stand, VC to correct posterior lean and improve upright posture. Pt attempts to pull to stand with RW, VC and demonstration to correct with limited carryover. Stand step transfer moving to her L to get to recliner, assistance for balance and VC for sequencing. Pt with lateral and posterior LOB while moving to sit in chair requiring MOD A for controlled lower to chair. Pt unable to identify what caused her LOB.    Ambulation/Gait                   Stairs             Wheelchair Mobility     Tilt Bed    Modified Rankin (Stroke Patients Only)       Balance Overall balance assessment: Needs assistance Sitting-balance support: Bilateral upper extremity supported, Feet supported Sitting balance-Leahy Scale: Fair Sitting balance - Comments: Fair balance seated at EOB despite reports of dizziness, no trunk sway noted while seated Postural control: Posterior lean, Left lateral lean Standing balance support: During functional activity, Reliant on assistive device for balance Standing balance-Leahy Scale: Poor Standing balance  comment: reliant on RW for balance with additional support from PT in standing due to posterior and left lateral lean                            Communication Communication Communication: Impaired Factors Affecting Communication: Hearing impaired  Cognition Arousal: Alert Behavior During Therapy: WFL for tasks assessed/performed   PT -  Cognitive impairments: No apparent impairments                         Following commands: Intact      Cueing Cueing Techniques: Verbal cues, Gestural cues  Exercises      General Comments General comments (skin integrity, edema, etc.): O2 on 1L at start 99%, tolerated session on RA 90-95%, left on RA at end of session with no complaints of SOB or chest pain. VC for PLB      Pertinent Vitals/Pain Pain Assessment Pain Assessment: No/denies pain    Home Living                          Prior Function            PT Goals (current goals can now be found in the care plan section) Acute Rehab PT Goals Patient Stated Goal: to feel better and get stronger PT Goal Formulation: With patient/family Time For Goal Achievement: 04/03/24 Potential to Achieve Goals: Fair Progress towards PT goals: Progressing toward goals    Frequency    Min 2X/week      PT Plan      Co-evaluation              AM-PAC PT 6 Clicks Mobility   Outcome Measure  Help needed turning from your back to your side while in a flat bed without using bedrails?: A Little Help needed moving from lying on your back to sitting on the side of a flat bed without using bedrails?: A Lot Help needed moving to and from a bed to a chair (including a wheelchair)?: A Lot Help needed standing up from a chair using your arms (e.g., wheelchair or bedside chair)?: A Lot Help needed to walk in hospital room?: Total Help needed climbing 3-5 steps with a railing? : Total 6 Click Score: 11    End of Session Equipment Utilized During Treatment: Gait belt Activity Tolerance: Patient tolerated treatment well;No increased pain (monitor O2 and BP) Patient left: in chair;with call bell/phone within reach;with chair alarm set Nurse Communication: Mobility status;Other (comment) (left on RA) PT Visit Diagnosis: Muscle weakness (generalized) (M62.81);Other abnormalities of gait and mobility  (R26.89);History of falling (Z91.81)     Time: 9043-8980 PT Time Calculation (min) (ACUTE ONLY): 23 min  Charges:    $Therapeutic Activity: 23-37 mins                        Isaiah DEL. Adreonna Yontz, PT, DPT  [employer] 03/24/2024, 10:26 AM

## 2024-03-24 NOTE — Progress Notes (Signed)
 "   Patient Name: [name] Long Date of Encounter: 03/24/2024 [facility] Cardiologist: Ozell Fell, MD   Interval Summary  .    No acute events overnight.  Leg swelling improved.  No CV complaints.  Vital Signs .    Vitals:   03/23/24 1746 03/23/24 1900 03/23/24 2300 03/24/24 0300  BP: (!) 142/64 131/69 (!) 126/55 (!) 147/69  Pulse:  (!) 58 (!) 53 (!) 57  Resp: (!) 22 20 16 14   Temp: 97.9 F (36.6 C) (!) 97.5 F (36.4 C) 97.7 F (36.5 C) (!) 97.4 F (36.3 C)  TempSrc: Oral Oral Oral Oral  SpO2: 100% 100% 100% 100%  Weight:    72.4 kg  Height:    5' 4 (1.626 m)    Intake/Output Summary (Last 24 hours) at 03/24/2024 0758 Last data filed at 03/24/2024 0651 Gross per 24 hour  Intake 720 ml  Output 2700 ml  Net -1980 ml      03/24/2024    3:00 AM 03/23/2024    4:03 AM 03/22/2024    3:00 AM  Last 3 Weights  Weight (lbs) 159 lb 9.8 oz 162 lb 11.2 oz 173 lb 11.6 oz  Weight (kg) 72.4 kg 73.8 kg 78.8 kg      Telemetry/ECG    Telemetry 03/21/2024- Personally Reviewed A-fib with rates in 70s  RHC 03/20/2024: 1. Elevated right and left heart filling pressures.  RV failure seems mildly out of proportion to LV failure.  2. Preserved PAPi 3. 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  4. Low but not markedly low cardiac output.    Would continue diuresis, Lasix  increased to 80 mg IV bid.   Echocardiogram 03/19/2024: 1. Left ventricular ejection fraction, by estimation, is 60 to 65%. The  left ventricle has normal function. The left ventricle has no regional  wall motion abnormalities. There is moderate left ventricular hypertrophy  of the basal-septal segment. Left ventricular diastolic parameters are indeterminate.  Elevated left atrial pressure.   2. Right ventricular systolic function is mildly reduced. The right  ventricular size is mildly enlarged. There is moderately elevated  pulmonary artery systolic pressure.   3. Left atrial size  was severely dilated.   4. Right atrial size was severely dilated.   5. A small pericardial effusion is present.   6. The mitral valve is normal in structure. Mild to moderate mitral valve  regurgitation. No evidence of mitral stenosis.   7. Tricuspid valve regurgitation is moderate to severe.   8. The aortic valve is tricuspid. Aortic valve regurgitation is not  visualized. Aortic valve sclerosis/calcification is present, without any  evidence of aortic stenosis.   9. Pulmonic valve regurgitation is moderate.  10. The inferior vena cava is dilated in size with <50% respiratory  variability, suggesting right atrial pressure of 15 mmHg.   Physical Exam .    General:  Well nourished, well developed, in no acute distress HEENT: normal Neck: + TR pulsations Vascular: No carotid bruits; Distal pulses 2+ bilaterally Cardiac:  normal S1, S2; RRR; no murmur  Lungs:  clear to auscultation bilaterally, no wheezing, rhonchi or rales  Abd: soft, nontender, no hepatomegaly  Ext: no edema Musculoskeletal:  No deformities. Trace edema bilaterally Skin: warm and dry  Neuro: no focal abnormalities noted Psych:  Normal affect   Assessment & Plan .     84 y.o. female with permanent A-fib, longstanding moderate to severe mitral and tricuspid regurgitation, pulmonary hypertension, now admitted with decompensated RV failure  Acute on chronic HFpEF, right-sided heart failure: -Clinically improved with improved LE edema -Continue Jardiance  10 -Increase Entresto  to mid dose -Hold BB for now. -Change Lasix  to 80-mg PO BID -Ambulate today, possible d/c to rehab today   Moderate to severe TR: -Consider echo in future now that patient is more euvolemic  PHTN: -Mixed pulmonary venous and arterial HTN with wedge 19, PVR >3. -Continue lasix     Permanent atrial fibrillation: -Slow AF -Continue Eliquis  5mg  BID -Hold BB for now  For questions or updates, please contact [facility] Please consult [email or web address] for contact info under        Signed, Silviano Neuser K Eulla Kochanowski, MD   "

## 2024-03-24 NOTE — TOC Transition Note (Signed)
 Transition of Care [facility]) - Discharge Note   Patient Details  Name: [name] Long MRN: [id number] Date of Birth: 1941-01-02  Transition of [facility]) CM/SW Contact:  Luise JAYSON Pan, LCSWA Phone Number: 03/24/2024, 3:40 PM   Clinical Narrative:   Patient will DC to: [facility] SNF Anticipated DC date: 03/24/24  Family notified: Bow[place] Daughter, [phone]   Transport by: [facility]   Per MD patient ready for DC to [facility]. RN to call report prior to discharge [phone]; room 210). RN, patient, patient's family, and facility notified of DC. Discharge Summary and FL2 sent to facility. DC packet on chart. Ambulance transport requested for patient 3:39 PM.   CSW will sign off for now as social work intervention is no longer needed. Please consult us  again if new needs arise.      Final next level of care: Skilled Nursing Facility Barriers to Discharge: Barriers Resolved   Patient Goals and CMS Choice            Discharge Placement PASRR number recieved: 03/21/24            Patient chooses bed at: [facility] Patient to be transferred to facility by: [facility] Name of family member notified: Lauth Baker  Daughter, Emergency Contact  [phone] Patient and family notified of of transfer: 03/24/24  Discharge Plan and Services Additional resources added to the After Visit Summary for       Post Acute Care Choice: Skilled Nursing Facility                               Social Drivers of Health (SDOH) Interventions SDOH Screenings   Tobacco Use: Low Risk (03/18/2024)     Readmission Risk Interventions    03/19/2024    3:29 PM  Readmission Risk Prevention Plan  Post Dischage Appt Complete  Medication Screening Complete  Transportation Screening Complete

## 2024-03-24 NOTE — TOC Progression Note (Addendum)
 Transition of Care [facility]) - Progression Note    Patient Details  Name: [name] Long MRN: [id number] Date of Birth: [birth year]  Transition of Care [facility]) CM/SW Contact  Luise JAYSON Pan, [place] Phone Number: 03/24/2024, 10:54 AM  Clinical Narrative:   This CSW started [facility] for [facility], [place] [id number]. CSW awaiting facility to confirm bed availability.  12:25 PM Insurance inquired about patients level of assistance PTA. CSW informed insurance of patients prior level of function. CSW updated family of pending auth.  1:05 PM Auth approved, CSW notified facility and awaiting facility to inform CSW if patient can dc today. Approval dates 03/24/2024-03/26/2024.  Expected Discharge Plan: Skilled Nursing Facility Barriers to Discharge: Insurance Authorization               Expected Discharge Plan and Services     Post Acute Care Choice: Skilled Nursing Facility Living arrangements for the past 2 months: Single Family Home Expected Discharge Date: 03/24/24                                     Social Drivers of Health (SDOH) Interventions SDOH Screenings   Tobacco Use: Low Risk (03/18/2024)    Readmission Risk Interventions    03/19/2024    3:29 PM  Readmission Risk Prevention Plan  Post Dischage Appt Complete  Medication Screening Complete  Transportation Screening Complete

## 2024-03-24 NOTE — TOC Progression Note (Addendum)
 Transition of Care [facility]) - Progression Note    Patient Details  Name: [name] Long MRN: [id number] Date of Birth: [birth year]  Transition [facility]) CM/SW Contact  [name] Long, [name] Long Phone Number: 03/24/2024, 8:49 AM  Clinical Narrative:   CSW noting discharge order to SNF. CSW attempted to reach daughter, [name] Long, to discuss SNF choice, left a voicemail asking for call back. CSW to follow.  UPDATE 8:55 AM: CSW spoke with daughter, [name] Long, she chose [facility]. [name] Long would like a tour, CSW contacted [facility] to verify bed availability and ask them to contact daughter to schedule a tour. Patient will need updated PT note for insurance authorization, CSW contacted acute rehab to ask for pick-up. CSW to follow.    Expected Discharge Plan: Skilled Nursing Facility Barriers to Discharge: Insurance Authorization               Expected Discharge Plan and Services     Post Acute Care Choice: Skilled Nursing Facility Living arrangements for the past 2 months: Single Family Home Expected Discharge Date: 03/24/24                                     Social Drivers of Health (SDOH) Interventions SDOH Screenings   Tobacco Use: Low Risk (03/18/2024)    Readmission Risk Interventions    03/19/2024    3:29 PM  Readmission Risk Prevention Plan  Post Dischage Appt Complete  Medication Screening Complete  Transportation Screening Complete

## 2024-03-24 NOTE — Plan of Care (Signed)
 " Problem: Education: Goal: Knowledge of General Education information will improve Description: Including pain rating scale, medication(s)/side effects and non-pharmacologic comfort measures 03/24/2024 1525 by Tad Delon SAUNDERS, RN Outcome: Adequate for Discharge 03/24/2024 1418 by Tad Delon SAUNDERS, RN Outcome: Progressing   Problem: Health Behavior/Discharge Planning: Goal: Ability to manage health-related needs will improve 03/24/2024 1525 by Tad Delon SAUNDERS, RN Outcome: Adequate for Discharge 03/24/2024 1418 by Tad Delon SAUNDERS, RN Outcome: Progressing   Problem: Clinical Measurements: Goal: Ability to maintain clinical measurements within normal limits will improve 03/24/2024 1525 by Tad Delon SAUNDERS, RN Outcome: Adequate for Discharge 03/24/2024 1418 by Tad Delon SAUNDERS, RN Outcome: Progressing Goal: Will remain free from infection 03/24/2024 1525 by Tad Delon SAUNDERS, RN Outcome: Adequate for Discharge 03/24/2024 1418 by Tad Delon SAUNDERS, RN Outcome: Progressing Goal: Diagnostic test results will improve 03/24/2024 1525 by Tad Delon SAUNDERS, RN Outcome: Adequate for Discharge 03/24/2024 1418 by Tad Delon SAUNDERS, RN Outcome: Progressing Goal: Respiratory complications will improve 03/24/2024 1525 by Tad Delon SAUNDERS, RN Outcome: Adequate for Discharge 03/24/2024 1418 by Tad Delon SAUNDERS, RN Outcome: Progressing Goal: Cardiovascular complication will be avoided 03/24/2024 1525 by Tad Delon SAUNDERS, RN Outcome: Adequate for Discharge 03/24/2024 1418 by Tad Delon SAUNDERS, RN Outcome: Progressing   Problem: Activity: Goal: Risk for activity intolerance will decrease 03/24/2024 1525 by Tad Delon SAUNDERS, RN Outcome: Adequate for Discharge 03/24/2024 1418 by Tad Delon SAUNDERS, RN Outcome: Progressing   Problem: Nutrition: Goal: Adequate nutrition will be maintained 03/24/2024 1525 by Tad Delon SAUNDERS, RN Outcome: Adequate for Discharge 03/24/2024 1418 by  Tad Delon SAUNDERS, RN Outcome: Progressing   Problem: Coping: Goal: Level of anxiety will decrease 03/24/2024 1525 by Tad Delon SAUNDERS, RN Outcome: Adequate for Discharge 03/24/2024 1418 by Tad Delon SAUNDERS, RN Outcome: Progressing   Problem: Elimination: Goal: Will not experience complications related to bowel motility 03/24/2024 1525 by Tad Delon SAUNDERS, RN Outcome: Adequate for Discharge 03/24/2024 1418 by Tad Delon SAUNDERS, RN Outcome: Progressing Goal: Will not experience complications related to urinary retention 03/24/2024 1525 by Tad Delon SAUNDERS, RN Outcome: Adequate for Discharge 03/24/2024 1418 by Tad Delon SAUNDERS, RN Outcome: Progressing   Problem: Pain Managment: Goal: General experience of comfort will improve and/or be controlled 03/24/2024 1525 by Tad Delon SAUNDERS, RN Outcome: Adequate for Discharge 03/24/2024 1418 by Tad Delon SAUNDERS, RN Outcome: Progressing   Problem: Safety: Goal: Ability to remain free from injury will improve 03/24/2024 1525 by Tad Delon SAUNDERS, RN Outcome: Adequate for Discharge 03/24/2024 1418 by Tad Delon SAUNDERS, RN Outcome: Progressing   Problem: Skin Integrity: Goal: Risk for impaired skin integrity will decrease 03/24/2024 1525 by Tad Delon SAUNDERS, RN Outcome: Adequate for Discharge 03/24/2024 1418 by Tad Delon SAUNDERS, RN Outcome: Progressing   Problem: Education: Goal: Ability to demonstrate management of disease process will improve 03/24/2024 1525 by Tad Delon SAUNDERS, RN Outcome: Adequate for Discharge 03/24/2024 1418 by Tad Delon SAUNDERS, RN Outcome: Progressing Goal: Ability to verbalize understanding of medication therapies will improve 03/24/2024 1525 by Tad Delon SAUNDERS, RN Outcome: Adequate for Discharge 03/24/2024 1418 by Tad Delon SAUNDERS, RN Outcome: Progressing Goal: Individualized Educational Video(s) 03/24/2024 1525 by Tad Delon SAUNDERS, RN Outcome: Adequate for Discharge 03/24/2024 1418  by Tad Delon SAUNDERS, RN Outcome: Progressing   Problem: Activity: Goal: Capacity to carry out activities will improve 03/24/2024 1525 by Tad Delon SAUNDERS, RN Outcome: Adequate for Discharge 03/24/2024 1418 by Tad Delon SAUNDERS, RN Outcome: Progressing   Problem: Cardiac: Goal: Ability to achieve and  maintain adequate cardiopulmonary perfusion will improve 03/24/2024 1525 by Tad Delon SAUNDERS, RN Outcome: Adequate for Discharge 03/24/2024 1418 by Tad Delon SAUNDERS, RN Outcome: Progressing   Problem: Education: Goal: Understanding of CV disease, CV risk reduction, and recovery process will improve 03/24/2024 1525 by Tad Delon SAUNDERS, RN Outcome: Adequate for Discharge 03/24/2024 1418 by Tad Delon SAUNDERS, RN Outcome: Progressing Goal: Individualized Educational Video(s) 03/24/2024 1525 by Tad Delon SAUNDERS, RN Outcome: Adequate for Discharge 03/24/2024 1418 by Tad Delon SAUNDERS, RN Outcome: Progressing   Problem: Activity: Goal: Ability to return to baseline activity level will improve 03/24/2024 1525 by Tad Delon SAUNDERS, RN Outcome: Adequate for Discharge 03/24/2024 1418 by Tad Delon SAUNDERS, RN Outcome: Progressing   Problem: Cardiovascular: Goal: Ability to achieve and maintain adequate cardiovascular perfusion will improve 03/24/2024 1525 by Tad Delon SAUNDERS, RN Outcome: Adequate for Discharge 03/24/2024 1418 by Tad Delon SAUNDERS, RN Outcome: Progressing Goal: Vascular access site(s) Level 0-1 will be maintained 03/24/2024 1525 by Tad Delon SAUNDERS, RN Outcome: Adequate for Discharge 03/24/2024 1418 by Tad Delon SAUNDERS, RN Outcome: Progressing   Problem: Health Behavior/Discharge Planning: Goal: Ability to safely manage health-related needs after discharge will improve 03/24/2024 1525 by Tad Delon SAUNDERS, RN Outcome: Adequate for Discharge 03/24/2024 1418 by Tad Delon SAUNDERS, RN Outcome: Progressing   "

## 2024-03-31 ENCOUNTER — Ambulatory Visit (HOSPITAL_COMMUNITY)

## 2024-04-01 ENCOUNTER — Telehealth (HOSPITAL_COMMUNITY): Payer: Self-pay

## 2024-04-01 NOTE — Telephone Encounter (Signed)
 Called to confirm/remind patient of their appointment at the [facility] on 04/02/24 9:00.   Appointment:   [x] Confirmed  [] Left mess   [] No answer/No voice mail  [] VM Full/unable to leave message  [] Phone not in service  Patient reminded to bring all medications and/or complete list.  Confirmed patient has transportation. Gave directions, instructed to utilize valet parking.

## 2024-04-02 ENCOUNTER — Ambulatory Visit (HOSPITAL_COMMUNITY)
Admission: RE | Admit: 2024-04-02 | Discharge: 2024-04-02 | Disposition: A | Discharge status: Home / Self Care | Source: Ambulatory Visit | Attending: Cardiology | Admitting: Cardiology

## 2024-04-02 ENCOUNTER — Ambulatory Visit (HOSPITAL_COMMUNITY): Payer: Self-pay | Admitting: Cardiology

## 2024-04-02 ENCOUNTER — Encounter (HOSPITAL_COMMUNITY): Payer: Self-pay

## 2024-04-02 VITALS — BP 126/84 | HR 77 | Ht 64.0 in | Wt 147.4 lb

## 2024-04-02 DIAGNOSIS — I4821 Permanent atrial fibrillation: Secondary | ICD-10-CM | POA: Insufficient documentation

## 2024-04-02 DIAGNOSIS — N1832 Chronic kidney disease, stage 3b: Secondary | ICD-10-CM | POA: Insufficient documentation

## 2024-04-02 DIAGNOSIS — I130 Hypertensive heart and chronic kidney disease with heart failure and stage 1 through stage 4 chronic kidney disease, or unspecified chronic kidney disease: Secondary | ICD-10-CM | POA: Insufficient documentation

## 2024-04-02 DIAGNOSIS — I2722 Pulmonary hypertension due to left heart disease: Secondary | ICD-10-CM | POA: Insufficient documentation

## 2024-04-02 DIAGNOSIS — Z7901 Long term (current) use of anticoagulants: Secondary | ICD-10-CM | POA: Insufficient documentation

## 2024-04-02 DIAGNOSIS — Z7984 Long term (current) use of oral hypoglycemic drugs: Secondary | ICD-10-CM | POA: Insufficient documentation

## 2024-04-02 DIAGNOSIS — I5042 Chronic combined systolic (congestive) and diastolic (congestive) heart failure: Secondary | ICD-10-CM

## 2024-04-02 DIAGNOSIS — I272 Pulmonary hypertension, unspecified: Secondary | ICD-10-CM | POA: Insufficient documentation

## 2024-04-02 DIAGNOSIS — D472 Monoclonal gammopathy: Secondary | ICD-10-CM | POA: Insufficient documentation

## 2024-04-02 DIAGNOSIS — I50810 Right heart failure, unspecified: Secondary | ICD-10-CM | POA: Insufficient documentation

## 2024-04-02 DIAGNOSIS — Z79899 Other long term (current) drug therapy: Secondary | ICD-10-CM | POA: Insufficient documentation

## 2024-04-02 DIAGNOSIS — E1122 Type 2 diabetes mellitus with diabetic chronic kidney disease: Secondary | ICD-10-CM | POA: Insufficient documentation

## 2024-04-02 DIAGNOSIS — I081 Rheumatic disorders of both mitral and tricuspid valves: Secondary | ICD-10-CM | POA: Insufficient documentation

## 2024-04-02 DIAGNOSIS — I5032 Chronic diastolic (congestive) heart failure: Secondary | ICD-10-CM | POA: Insufficient documentation

## 2024-04-02 LAB — BASIC METABOLIC PANEL WITH GFR
Anion gap: 11 (ref 5–15)
BUN: 42 mg/dL — ABNORMAL HIGH (ref 8–23)
CO2: 32 mmol/L (ref 22–32)
Calcium: 9 mg/dL (ref 8.9–10.3)
Chloride: 95 mmol/L — ABNORMAL LOW (ref 98–111)
Creatinine, Ser: 1.41 mg/dL — ABNORMAL HIGH (ref 0.44–1.00)
GFR, Estimated: 37 mL/min — ABNORMAL LOW
Glucose, Bld: 110 mg/dL — ABNORMAL HIGH (ref 70–99)
Potassium: 5.4 mmol/L — ABNORMAL HIGH (ref 3.5–5.1)
Sodium: 138 mmol/L (ref 135–145)

## 2024-04-02 NOTE — Progress Notes (Signed)
 "    HEART & VASCULAR TRANSITION OF CARE CONSULT NOTE     Referring Physician: Dr. Danton Primary Cardiologist: Dr. Wonda   Chief Complaint: HFpEF w/ Prominent RV Failure and Pulmonary HTN   HPI: Referred to clinic by Dr. Danton, Internal Medicine, for heart failure consultation.   [name] Long is a 84 y.o. female with permanent A-fib, longstanding moderate to severe mitral and tricuspid regurgitation, pulmonary hypertension, HFpEF w/ mild RV dysfunction, MGUS, HTN, T2DM and CKD IIIb.  She travels between her home in [place] and [place] where her daughter lives. Followed locally Dr. Wonda. Her valvular dysfunction is felt to be functional d/t annular dilatation and she has been managed medically.   She was admitted earlier this month for a/c NYHA class III HFpEF + lower ext edema. Echo EF 60-65%, mod LVH of the basal-septal segment, RV mildly reduced, severe BAE, mild-mod MR, mod-severe TR, estimated RVSP severely elevated at 53 mmhg. RHC demonstrated elevated R+ L heart filling pressures w/ RV failure mildly out of proportion to LV failure, mod mixed pulmonary venous/pulmonary arterial hypertension, preserved PAPi and low but not markedly low cardiac output (RA 14, PA 65/16/32, PCWP 19, FICK CI 2.1, PAPi 3.5, PVR 3.3 WU). She was diuresed w/ IV Lasix  and placed on HF GDMT. V/Q scan 1/26 negative for acute PE/ CTEPH. She was referred to [facility] at d/c. Discharge wt 159 lb.   Of note, she has h/o MGUS, last set of labs through [facility] system was in 2017 (+M spike protein, 2.1 g/dL, Kappa/Lambda Ratio abnormal at 2.29, predominant Kappa LC 29.8 mg/L). She was being followed by Dr. Gudena. Last OV 03/26/15.   She presents today for f/u. Here w/ her daughter. Arrived in [facility]. Reports feeling better, LEE resolved and breathing improved. Denies resting dyspnea. No orthopnea/PND. Still at [facility] for rehab but anticipates being discharged end of this week. Has progressed w/ PT. Reports  stable NYHA II symptoms.   Wt today 147 lb.   Compliant w/ medications. Has some occasional positional dizziness but no syncope/ near syncope. Her Entreto dose was reduced at hospital d/c from 97-103 to 49-51 for this reason.    Wt Readings from Last 3 Encounters:  04/02/24 66.9 kg (147 lb 6.4 oz)  03/24/24 72.4 kg (159 lb 9.8 oz)  12/11/23 72.4 kg (159 lb 9.6 oz)     Cardiac Testing   RHC 03/20/2024: 1. Elevated right and left heart filling pressures.  RV failure seems mildly out of proportion to LV failure.  2. Preserved PAPi 3. 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  4. Low but not markedly low cardiac output.    Right Heart Pressures RHC Procedural Findings: Hemodynamics (mmHg) RA mean 14, v-waves to 19 RV 57/12 PA 65/16, mean 32 PCWP mean 19  Oxygen saturations: PA 63% AO 97%  Cardiac Output (Fick) 3.95  Cardiac Index (Fick) 2.1 PVR 3.3 WU  PAPi 3.5     Echocardiogram 03/19/2024: 1. Left ventricular ejection fraction, by estimation, is 60 to 65%. The  left ventricle has normal function. The left ventricle has no regional  wall motion abnormalities. There is moderate left ventricular hypertrophy  of the basal-septal segment. Left ventricular diastolic parameters are indeterminate.  Elevated left atrial pressure.   2. Right ventricular systolic function is mildly reduced. The right  ventricular size is mildly enlarged. There is moderately elevated  pulmonary artery systolic pressure.   3. Left atrial size was severely dilated.   4. Right atrial  size was severely dilated.   5. A small pericardial effusion is present.   6. The mitral valve is normal in structure. Mild to moderate mitral valve  regurgitation. No evidence of mitral stenosis.   7. Tricuspid valve regurgitation is moderate to severe.   8. The aortic valve is tricuspid. Aortic valve regurgitation is not  visualized. Aortic valve sclerosis/calcification is present, without any   evidence of aortic stenosis.   9. Pulmonic valve regurgitation is moderate.  10. The inferior vena cava is dilated in size with <50% respiratory  variability, suggesting right atrial pressure of 15 mmHg.     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     apixaban  (ELIQUIS ) 5 MG TABS tablet Take 5 mg by mouth 2 (two) times daily.     brimonidine  (ALPHAGAN ) 0.2 % ophthalmic solution Place 1 drop into both eyes 2 (two) times daily.     furosemide  (LASIX ) 80 MG tablet Take 1 tablet (80 mg total) by mouth 2 (two) times daily.     JARDIANCE  10 MG TABS tablet Take 10 mg by mouth in the morning.     latanoprost  (XALATAN ) 0.005 % ophthalmic solution Place 1 drop into both eyes at bedtime.     polyethylene glycol (MIRALAX  / GLYCOLAX ) 17 g packet Take 17 g by mouth daily as needed for mild constipation, moderate constipation or severe constipation.     sacubitril -valsartan  (ENTRESTO ) 49-51 MG Take 1 tablet by mouth 2 (two) times daily.     No current facility-administered medications for this encounter.    Allergies[1]    Social History   Socioeconomic History   Marital status: Widowed    Spouse name: Not on file   Number of children: Not on file   Years of education: Not on file   Highest education level: Not on file  Occupational History   Not on file  Tobacco Use   Smoking status: Never   Smokeless tobacco: Never  Substance and Sexual Activity   Alcohol  use: Never   Drug use: Never   Sexual activity: Not on file  Other Topics Concern   Not on file  Social History Narrative   Not on file   Social Drivers of Health   Tobacco Use: Low Risk (04/02/2024)   Patient History    Smoking Tobacco Use: Never    Smokeless Tobacco Use: Never    Passive Exposure: Not on file  Financial Resource Strain: Not on file  Food Insecurity: Not on file  Transportation Needs: Not on file  Physical Activity: Not on file  Stress: Not on file  Social Connections: Not on file  Intimate Partner Violence: Not on file  Depression (EYV7-0): Not on file  Alcohol Screen: Not on file  Housing: Not on file  Utilities: Not on file  Health Literacy: Not on file     No family history  on file.  Vitals:   04/02/24 0906  BP: 126/84  Pulse: 77  SpO2: 95%  Weight: 66.9 kg (147 lb 6.4 oz)  Height: 5' 4 (1.626 m)    PHYSICAL EXAM: General:  Well appearing elderly female. No respiratory difficulty HEENT: normal Neck: JVD not elevated.  Cor: PMI nondisplaced. Irregularly irregular rhythm and rate. 2/6 MR/TR Lungs: clear Abdomen: soft, nontender, nondistended. No hepatosplenomegaly.  Extremities: no cyanosis, clubbing, rash, edema Neuro: alert & oriented x 3, cranial nerves grossly intact. moves all 4 extremities w/o difficulty. Affect pleasant.  ECG: not performed    ASSESSMENT & PLAN:  1. HFpEF w/ Predominant RV Failure  - Echo 1/26 EF 60-65%, mod LVH of the basal-septal segment, RV mildly reduced - RHC demonstrated elevated R+ L heart filling pressures w/ RV failure mildly out of proportion to LV failure, preserved PAPi and low but not markedly low cardiac output (RA 14, PA 65/16/32, PCWP 19, FICK CI 2.1, PAPi 3.5). - Etiology for RV dysfunction most likely d/t PH but given LVH, h/o MGUS, conduction abnormalities and CKD, cardiac amyloidosis is a possibility. Will update amyloid labs. Will discuss cMRI at next visit  - NYHA Class II, improved -  Euvolemic on exam. Wt continues to trend down. Check BMP today. If BUN/SCr elevated, will reduce Lasix  dose - for now, continue Lasix   80 mg daily  - continue Jardiance  10 mg daily  - continue Entresto  49-51 mg bid. She did not tolerate 97-103 dose d/t orthostasis   2. 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 She has not had any further w/u to ascertain other etiologies - will obtain CTD serologies (Centromere Antibodies, Anti-Scleroderma Antibody, ANA w/ Reflex if Positive, Rheumatoid Factor)  - she denies h/o tobacco use, no h/o COPD or OSA. Denies h/o snoring. If CTD w/u negative, may consider PFTs and sleep study   3. MGUS - previously seen by Dr. Odean but lost to f/u - last OV/Labs in 2017 showed elevated M spike protein, 2.1 g/dLl; Kappa/Lambda Ratio abnormal at 2.29, predominant Kappa LC 29.8 mg/L - given concern for possible cardiac amyloid, I will repeat multiple myeloma and Kappa/Lambda Light Chain ratio  - refer back to Dr. Gudena for surveillance   4. CKD IIIb - baseline SCr 1.2 - on SGLT2i, Jardiance .  - check BMP today   5. Permanent Afib - rate controlled - on Eliquis  for a/c    Referred to HFSW (PCP, Medications, Transportation, ETOH Abuse, Drug Abuse, Insurance, Financial ): Yes or No Refer to Pharmacy: Yes or No Refer to Home Health: Yes on No Refer to [facility]: Yes or no  Refer to General Cardiology: Yes or No  Follow up  in [facility] clinic in 2 wks   Charly Holcomb, PA-C 04/02/2024       [1] No Known Allergies  "

## 2024-04-02 NOTE — Patient Instructions (Signed)
 Medication Changes:  No Changes In Medications at this time.   Lab Work:  Labs done today, your results will be available in MyChart, we will contact you for abnormal readings.  Referrals:  YOU HAVE BEEN REFERRED TO Hematology THEY WILL REACH OUT TO YOU OR CALL TO ARRANGE THIS. PLEASE CALL US  WITH ANY CONCERNS   Follow-Up in: 2 WEEKS AS SCHEDULED WITH TOC CLINIC   At the [facility], you and your health needs are our priority. We have a designated team specialized in the treatment of Heart Failure. This Care Team includes your primary Heart Failure Specialized Cardiologist (physician), Advanced Practice Providers (APPs- Physician Assistants and Nurse Practitioners), and Pharmacist who all work together to provide you with the care you need, when you need it.   You may see any of the following providers on your designated Care Team at your next follow up:  Dr. Toribio Fuel Dr. Ezra Shuck Dr. Odis Brownie Greig Mosses, NP Caffie Shed, [place] [facility] [place], [place] Beckey Coe, NP Jordan Lee, NP Tinnie Redman, PharmD   Please be sure to bring in all your medications bottles to every appointment.   Need to Contact Us :  If you have any questions or concerns before your next appointment please send us  a message through [place] or call our office at [phone].    TO LEAVE A MESSAGE FOR THE NURSE SELECT OPTION 2, PLEASE LEAVE A MESSAGE INCLUDING: YOUR NAME DATE OF BIRTH CALL BACK NUMBER REASON FOR CALL**this is important as we prioritize the call backs  YOU WILL RECEIVE A CALL BACK THE SAME DAY AS LONG AS YOU CALL BEFORE 4:00 PM

## 2024-04-03 ENCOUNTER — Encounter (HOSPITAL_BASED_OUTPATIENT_CLINIC_OR_DEPARTMENT_OTHER): Admitting: General Surgery

## 2024-04-03 LAB — ANA W/REFLEX: Anti Nuclear Antibody (ANA): NEGATIVE

## 2024-04-03 LAB — ANTI-SCLERODERMA ANTIBODY: Scleroderma (Scl-70) (ENA) Antibody, IgG: <0.2 AI (ref 0.0–0.9)

## 2024-04-03 LAB — CENTROMERE ANTIBODIES: Centromere Ab Screen: <0.2 AI (ref 0.0–0.9)

## 2024-04-03 LAB — RHEUMATOID FACTOR: Rheumatoid fact SerPl-aCnc: 39.4 [IU]/mL — ABNORMAL HIGH

## 2024-04-04 ENCOUNTER — Other Ambulatory Visit: Payer: Self-pay

## 2024-04-04 ENCOUNTER — Emergency Department (HOSPITAL_COMMUNITY)

## 2024-04-04 ENCOUNTER — Inpatient Hospital Stay (HOSPITAL_COMMUNITY)
Admission: EM | Admit: 2024-04-04 | Discharge: 2024-04-06 | DRG: 312 | Disposition: A | Attending: Family Medicine | Admitting: Family Medicine

## 2024-04-04 ENCOUNTER — Encounter (HOSPITAL_COMMUNITY): Payer: Self-pay | Admitting: *Deleted

## 2024-04-04 DIAGNOSIS — R55 Syncope and collapse: Secondary | ICD-10-CM | POA: Diagnosis not present

## 2024-04-04 DIAGNOSIS — Z7984 Long term (current) use of oral hypoglycemic drugs: Secondary | ICD-10-CM

## 2024-04-04 DIAGNOSIS — R7989 Other specified abnormal findings of blood chemistry: Secondary | ICD-10-CM | POA: Clinically undetermined

## 2024-04-04 DIAGNOSIS — I10 Essential (primary) hypertension: Secondary | ICD-10-CM | POA: Diagnosis present

## 2024-04-04 DIAGNOSIS — I371 Nonrheumatic pulmonary valve insufficiency: Secondary | ICD-10-CM | POA: Diagnosis present

## 2024-04-04 DIAGNOSIS — I50812 Chronic right heart failure: Secondary | ICD-10-CM | POA: Diagnosis not present

## 2024-04-04 DIAGNOSIS — I509 Heart failure, unspecified: Secondary | ICD-10-CM

## 2024-04-04 DIAGNOSIS — I5032 Chronic diastolic (congestive) heart failure: Secondary | ICD-10-CM | POA: Diagnosis not present

## 2024-04-04 DIAGNOSIS — I130 Hypertensive heart and chronic kidney disease with heart failure and stage 1 through stage 4 chronic kidney disease, or unspecified chronic kidney disease: Secondary | ICD-10-CM | POA: Diagnosis present

## 2024-04-04 DIAGNOSIS — I429 Cardiomyopathy, unspecified: Secondary | ICD-10-CM | POA: Diagnosis present

## 2024-04-04 DIAGNOSIS — C9000 Multiple myeloma not having achieved remission: Secondary | ICD-10-CM | POA: Diagnosis present

## 2024-04-04 DIAGNOSIS — N1832 Chronic kidney disease, stage 3b: Secondary | ICD-10-CM | POA: Diagnosis present

## 2024-04-04 DIAGNOSIS — R946 Abnormal results of thyroid function studies: Secondary | ICD-10-CM | POA: Diagnosis present

## 2024-04-04 DIAGNOSIS — E7800 Pure hypercholesterolemia, unspecified: Secondary | ICD-10-CM | POA: Diagnosis present

## 2024-04-04 DIAGNOSIS — E1122 Type 2 diabetes mellitus with diabetic chronic kidney disease: Secondary | ICD-10-CM | POA: Diagnosis present

## 2024-04-04 DIAGNOSIS — I272 Pulmonary hypertension, unspecified: Secondary | ICD-10-CM

## 2024-04-04 DIAGNOSIS — I4819 Other persistent atrial fibrillation: Secondary | ICD-10-CM | POA: Diagnosis present

## 2024-04-04 DIAGNOSIS — I081 Rheumatic disorders of both mitral and tricuspid valves: Secondary | ICD-10-CM | POA: Diagnosis present

## 2024-04-04 DIAGNOSIS — Z79899 Other long term (current) drug therapy: Secondary | ICD-10-CM

## 2024-04-04 DIAGNOSIS — I959 Hypotension, unspecified: Secondary | ICD-10-CM | POA: Diagnosis present

## 2024-04-04 DIAGNOSIS — N183 Chronic kidney disease, stage 3 unspecified: Secondary | ICD-10-CM | POA: Diagnosis present

## 2024-04-04 DIAGNOSIS — E875 Hyperkalemia: Secondary | ICD-10-CM | POA: Diagnosis present

## 2024-04-04 DIAGNOSIS — Y92009 Unspecified place in unspecified non-institutional (private) residence as the place of occurrence of the external cause: Secondary | ICD-10-CM

## 2024-04-04 DIAGNOSIS — T502X5A Adverse effect of carbonic-anhydrase inhibitors, benzothiadiazides and other diuretics, initial encounter: Secondary | ICD-10-CM | POA: Diagnosis present

## 2024-04-04 DIAGNOSIS — R001 Bradycardia, unspecified: Secondary | ICD-10-CM | POA: Diagnosis present

## 2024-04-04 DIAGNOSIS — I4821 Permanent atrial fibrillation: Secondary | ICD-10-CM | POA: Diagnosis not present

## 2024-04-04 DIAGNOSIS — I5082 Biventricular heart failure: Secondary | ICD-10-CM | POA: Diagnosis present

## 2024-04-04 DIAGNOSIS — I2721 Secondary pulmonary arterial hypertension: Secondary | ICD-10-CM | POA: Diagnosis present

## 2024-04-04 DIAGNOSIS — Z7901 Long term (current) use of anticoagulants: Secondary | ICD-10-CM

## 2024-04-04 DIAGNOSIS — I3139 Other pericardial effusion (noninflammatory): Secondary | ICD-10-CM | POA: Diagnosis present

## 2024-04-04 LAB — PHOSPHORUS: Phosphorus: 4.3 mg/dL (ref 2.5–4.6)

## 2024-04-04 LAB — MULTIPLE MYELOMA PANEL, SERUM
Albumin SerPl Elph-Mcnc: 3.6 g/dL (ref 2.9–4.4)
Albumin/Glob SerPl: 0.7 (ref 0.7–1.7)
Alpha 1: 0.3 g/dL (ref 0.0–0.4)
Alpha2 Glob SerPl Elph-Mcnc: 0.8 g/dL (ref 0.4–1.0)
B-Globulin SerPl Elph-Mcnc: 1 g/dL (ref 0.7–1.3)
Gamma Glob SerPl Elph-Mcnc: 3.2 g/dL — ABNORMAL HIGH (ref 0.4–1.8)
Globulin, Total: 5.4 g/dL — ABNORMAL HIGH (ref 2.2–3.9)
IgA: 102 mg/dL (ref 64–422)
IgG (Immunoglobin G), Serum: 4143 mg/dL — ABNORMAL HIGH (ref 586–1602)
IgM (Immunoglobulin M), Srm: 91 mg/dL (ref 26–217)
M Protein SerPl Elph-Mcnc: 2.4 g/dL — ABNORMAL HIGH
Total Protein ELP: 9 g/dL — ABNORMAL HIGH (ref 6.0–8.5)

## 2024-04-04 LAB — CBC WITH DIFFERENTIAL/PLATELET
Abs Immature Granulocytes: 0.04 10*3/uL (ref 0.00–0.07)
Basophils Absolute: 0.1 10*3/uL (ref 0.0–0.1)
Basophils Relative: 1 %
Eosinophils Absolute: 0 10*3/uL (ref 0.0–0.5)
Eosinophils Relative: 0 %
HCT: 44 % (ref 36.0–46.0)
Hemoglobin: 14.1 g/dL (ref 12.0–15.0)
Immature Granulocytes: 1 %
Lymphocytes Relative: 12 %
Lymphs Abs: 1 10*3/uL (ref 0.7–4.0)
MCH: 32.3 pg (ref 26.0–34.0)
MCHC: 32 g/dL (ref 30.0–36.0)
MCV: 100.7 fL — ABNORMAL HIGH (ref 80.0–100.0)
Monocytes Absolute: 0.6 10*3/uL (ref 0.1–1.0)
Monocytes Relative: 7 %
Neutro Abs: 6.5 10*3/uL (ref 1.7–7.7)
Neutrophils Relative %: 79 %
Platelets: 235 10*3/uL (ref 150–400)
RBC: 4.37 MIL/uL (ref 3.87–5.11)
RDW: 13.9 % (ref 11.5–15.5)
WBC: 8.2 10*3/uL (ref 4.0–10.5)
nRBC: 0 % (ref 0.0–0.2)

## 2024-04-04 LAB — COMPREHENSIVE METABOLIC PANEL WITH GFR
ALT: 5 U/L (ref 0–44)
AST: 20 U/L (ref 15–41)
Albumin: 3.4 g/dL — ABNORMAL LOW (ref 3.5–5.0)
Alkaline Phosphatase: 65 U/L (ref 38–126)
Anion gap: 8 (ref 5–15)
BUN: 45 mg/dL — ABNORMAL HIGH (ref 8–23)
CO2: 31 mmol/L (ref 22–32)
Calcium: 8.6 mg/dL — ABNORMAL LOW (ref 8.9–10.3)
Chloride: 97 mmol/L — ABNORMAL LOW (ref 98–111)
Creatinine, Ser: 1.5 mg/dL — ABNORMAL HIGH (ref 0.44–1.00)
GFR, Estimated: 34 mL/min — ABNORMAL LOW
Glucose, Bld: 119 mg/dL — ABNORMAL HIGH (ref 70–99)
Potassium: 5.1 mmol/L (ref 3.5–5.1)
Sodium: 136 mmol/L (ref 135–145)
Total Bilirubin: 1.1 mg/dL (ref 0.0–1.2)
Total Protein: 8.6 g/dL — ABNORMAL HIGH (ref 6.5–8.1)

## 2024-04-04 LAB — PRO BRAIN NATRIURETIC PEPTIDE: Pro Brain Natriuretic Peptide: 926 pg/mL — ABNORMAL HIGH

## 2024-04-04 LAB — MAGNESIUM
Magnesium: 2.8 mg/dL — ABNORMAL HIGH (ref 1.7–2.4)
Magnesium: 2.7 mg/dL — ABNORMAL HIGH (ref 1.7–2.4)

## 2024-04-04 LAB — TROPONIN T, HIGH SENSITIVITY
Troponin T High Sensitivity: 51 ng/L — ABNORMAL HIGH (ref 0–19)
Troponin T High Sensitivity: 50 ng/L — ABNORMAL HIGH (ref 0–19)

## 2024-04-04 LAB — T4, FREE: Free T4: 1.58 ng/dL (ref 0.80–2.00)

## 2024-04-04 LAB — TSH: TSH: 4.94 u[IU]/mL — ABNORMAL HIGH (ref 0.350–4.500)

## 2024-04-04 MED ORDER — BRIMONIDINE TARTRATE 0.2 % OP SOLN
1.0000 [drp] | Freq: Two times a day (BID) | OPHTHALMIC | Status: DC
  Administered 2024-04-05 – 2024-04-06 (×3): 1 [drp] via OPHTHALMIC
  Filled 2024-04-04: qty 5

## 2024-04-04 MED ORDER — ACETAMINOPHEN 650 MG RE SUPP: 650.0000 mg | Freq: Four times a day (QID) | RECTAL | Status: DC | PRN

## 2024-04-04 MED ORDER — ONDANSETRON HCL 4 MG PO TABS: 4.0000 mg | ORAL_TABLET | Freq: Four times a day (QID) | ORAL | Status: DC | PRN

## 2024-04-04 MED ORDER — LATANOPROST 0.005 % OP SOLN
1.0000 [drp] | Freq: Every day | OPHTHALMIC | Status: DC
  Administered 2024-04-05: 1 [drp] via OPHTHALMIC
  Filled 2024-04-04: qty 2.5

## 2024-04-04 MED ORDER — SENNOSIDES-DOCUSATE SODIUM 8.6-50 MG PO TABS: 1.0000 | ORAL_TABLET | Freq: Every evening | ORAL | Status: DC | PRN

## 2024-04-04 MED ORDER — EMPAGLIFLOZIN 10 MG PO TABS
10.0000 mg | ORAL_TABLET | Freq: Every morning | ORAL | Status: DC
  Administered 2024-04-05 – 2024-04-06 (×2): 10 mg via ORAL
  Filled 2024-04-04 (×2): qty 1

## 2024-04-04 MED ORDER — TRAZODONE HCL 50 MG PO TABS: 25.0000 mg | ORAL_TABLET | Freq: Every evening | ORAL | Status: DC | PRN

## 2024-04-04 MED ORDER — SODIUM CHLORIDE 0.9 % IV SOLN: INTRAVENOUS | Status: AC

## 2024-04-04 MED ORDER — FUROSEMIDE 40 MG PO TABS: 40.0000 mg | ORAL_TABLET | Freq: Two times a day (BID) | ORAL | Status: DC

## 2024-04-04 MED ORDER — HYDROMORPHONE HCL 1 MG/ML IJ SOLN: 0.5000 mg | INTRAMUSCULAR | Status: DC | PRN

## 2024-04-04 MED ORDER — APIXABAN 5 MG PO TABS
5.0000 mg | ORAL_TABLET | Freq: Two times a day (BID) | ORAL | Status: DC
  Administered 2024-04-04 – 2024-04-06 (×4): 5 mg via ORAL
  Filled 2024-04-04 (×5): qty 1

## 2024-04-04 MED ORDER — FUROSEMIDE 20 MG PO TABS: 80.0000 mg | ORAL_TABLET | Freq: Two times a day (BID) | ORAL | Status: DC

## 2024-04-04 MED ORDER — SODIUM CHLORIDE 0.9 % IV SOLN: INTRAVENOUS | Status: DC

## 2024-04-04 MED ORDER — OXYCODONE HCL 5 MG PO TABS: 5.0000 mg | ORAL_TABLET | ORAL | Status: DC | PRN

## 2024-04-04 MED ORDER — HYDRALAZINE HCL 20 MG/ML IJ SOLN: 10.0000 mg | INTRAMUSCULAR | Status: DC | PRN

## 2024-04-04 MED ORDER — ONDANSETRON HCL 4 MG/2ML IJ SOLN: 4.0000 mg | Freq: Four times a day (QID) | INTRAMUSCULAR | Status: DC | PRN

## 2024-04-04 MED ORDER — FLEET ENEMA RE ENEM: 1.0000 | ENEMA | Freq: Once | RECTAL | Status: DC | PRN

## 2024-04-04 MED ORDER — HEPARIN SODIUM (PORCINE) 5000 UNIT/ML IJ SOLN: 5000.0000 [IU] | Freq: Three times a day (TID) | INTRAMUSCULAR | Status: DC

## 2024-04-04 MED ORDER — SODIUM CHLORIDE 0.9% FLUSH
3.0000 mL | Freq: Two times a day (BID) | INTRAVENOUS | Status: DC
  Administered 2024-04-05 – 2024-04-06 (×2): 3 mL via INTRAVENOUS

## 2024-04-04 MED ORDER — SODIUM CHLORIDE 0.9% FLUSH
3.0000 mL | Freq: Two times a day (BID) | INTRAVENOUS | Status: DC
  Administered 2024-04-05: 3 mL via INTRAVENOUS

## 2024-04-04 MED ORDER — ACETAMINOPHEN 325 MG PO TABS: 650.0000 mg | ORAL_TABLET | Freq: Four times a day (QID) | ORAL | Status: DC | PRN

## 2024-04-04 MED ORDER — IPRATROPIUM BROMIDE 0.02 % IN SOLN: 0.5000 mg | Freq: Four times a day (QID) | RESPIRATORY_TRACT | Status: DC | PRN

## 2024-04-04 NOTE — ED Triage Notes (Signed)
 BIB [facility] from home for witnessed syncopal episode, eased to ground, HR 34-40, clammy, lethargic. No CPR performed. FD first on scene had HR 34-40. EMS gave atropine 1 amp and NS 250 NS IVF to R AC 20g. BP improved, HR improved, mentation improved. A&Ox4. Clay O2 2L given for comfort. CBG 114. 70/40 up to 104/58, HR up to 60s-70s. RR 12. Reports aflutter bradycardic. Onset while watching TV. Arrives alert, NAD, calm, interactive, resps e/u, skin W&D, speaking in clear complete sentences.Denies complaints or sx.

## 2024-04-04 NOTE — Hospital Course (Addendum)
 [name] Long is a 83 year old female with extensive history of A-fib (on Eliquis ), cardiomyopathy, DM II, HTN, pulmonary HTN, lymphedema, MGUS .... Presented from home with syncopal episode. Patient had a witnessed lightheadedness as watching TV she was eased to the ground.  Did not fell hit her head. Patient was found bradycardic with heart rate of 35 - 40s, atropine was given the field.  Mildly hypotensive.  She was recently taken off beta-blockers due to bradycardia on the last admission. Denies any headaches visual changes or asymmetric weaknesses.  Recent hospitalization discharge from 1/13- 1/19 for pulmonary hypertension, s/p right cardiac cath,, discontinued beta-blocker due to bradycardia,  Denies any chest pain, shortness of breath, fever or chills.  ED Evaluation: Blood pressure 108/69, pulse 61, temperature 98 F (36.7 C), temperature source Oral, resp. rate 17, height 5' 4 (1.626 m), weight 66.7 kg, SpO2 100%.  LABs: WBC 8.2, potassium 5.4 >> 5.1, chloride 97, creatinine 1.50, GFR 34, calcium  8.6 glucose 119, magnesium 2.8, troponin 51, TSH 4.94 LP(a) 179.4  EKG in ED A-fib with a rate of 54, incomplete right bundle, QTc 464,  Requested patient to be admitted for syncopal episode, likely due to bradycardia EDP Dr. Freddi -consulted cardiology

## 2024-04-04 NOTE — ED Notes (Signed)
 No changes, VSS, taken to xray on monitor.

## 2024-04-04 NOTE — Assessment & Plan Note (Signed)
-   BP stable -Continue Lasix  in a.m. -Beta-blocker discontinued on previous admission, no ACE i/ARB Supposed to be on Entresto -not on med list currently Pending med verification Appreciate cardiology input

## 2024-04-04 NOTE — Telephone Encounter (Signed)
 Called and spoke with nurse at [facility] again to confirm this has been d/c'd due to fax not going thru despite numerous attempts. Confirmed with nurse Harland at [facility] that this order for entresto  has been discontinued.   Advised them to monitor BP is persistently >than 130 to call and make us  aware. She verbalized understanding.

## 2024-04-04 NOTE — ED Notes (Signed)
 Patient placed on bedpan.

## 2024-04-04 NOTE — Assessment & Plan Note (Signed)
 Bradycardia -likely led to syncopal episode -Patient has been taken off beta-blockers on previous admission -Continue Eliquis  -appreciate cardiology evaluation recommendations

## 2024-04-04 NOTE — Assessment & Plan Note (Signed)
 Hyperlipidemia with hyper -Lipoprotein (a) -disorder Statin not listed -Will check lipid panel

## 2024-04-04 NOTE — Consult Note (Addendum)
 "  Cardiology Consultation  Patient ID: [name] Long MRN: [id number]; DOB: August 18, 1940  Admit date: 04/04/2024 Date of Consult: 04/04/2024  PCP:  Patient, No Pcp Per   [facility] Providers Cardiologist:  Ozell Fell, MD     Patient Profile: [name] Long is a 84 y.o. female with a hx of permanent A-fib, longstanding moderate to severe mitral and tricuspid regurgitation, pulmonary hypertension, HFpEF w/ RV failure, MGUS, HTN, T2DM and CKD IIIb  who is being seen 04/04/2024 for the evaluation of syncope at the request of Dr. Freddi.  History of Present Illness: [name] Long has past medical history as stated above. She presented to the [facility] ED on 04/04/2024 for witnessed syncopal episode that occurred earlier today. EMS reported that HR was 34-40, noted to be clammy. She was given atropine. Reported BP on scene was 70/40 up to 104/58 with fluids.   Relevant workup in the ER includes: EKG showed A-Fib with rates 54, CBC unremarkable, CMP showed creatinine 1.50 (baseline ~1.1-1.3), initial troponin 51, Mag 2.8, TSH elevated. CXR stable cardiomegaly.   She follows closely with our AHF clinic, last seen by Caffie Shed, PA-C on 04/02/2024. At this appointment she reported that she was feeling better, her LE edema had resolved and her breathing improved. She denied SOB, PND, orthopnea. Her weight was 147 lb. She reported some occasional positional dizziness but no syncope but her Entresto  had recently been decreased for this reason. She had labs drawn that day that showed elevated potassium, which resulted in her Entresto  being discontinued entirely. She also ordered connective tissue disorder serologies at this visit, which have all resulted normal other than RF which was elevated at 39.4. There was concern for possible cardiac amyloid so there was repeat testing for multiple myeloma and Kappa/Lambda Light Chain ratio, this is still pending.   Current home meds: PO Lasix  80 mg daily,  Jardiance  10 mg daily.  Recent cardiac workup: RHC 03/20/24: elevated filling pressures, preserved PAPi, moderate mixed pulmonary venous/arterial HTN, low cardiac output   Echo 03/19/24: LVEF 60-65%, moderate LVH, mildly reduced RV function, moderately elevated PASP, severe BAE, small pericardial effusion, mild to moderate MR, moderate to severe TR, moderate PR, dilated IVC.   After speaking with the patient, and her family present in the room, they agree with the history as stated above.  She tells me that she was watching TV when she began feeling lightheaded, she was lowered to the ground, and then experienced a syncopal episode which she does not recall much of.  She confirms that she was taking PO Lasix  80 mg daily.  She was just getting home today from [facility].  She denies any changes in her diet, other sick symptoms, known sick contacts.  She tells me that her only medication changes recently was stopping the Entresto  when instructed to by advanced heart failure, and taking her Lasix  once a day instead of twice a day.  She denies any chest pain, shortness of breath, palpitations, LE edema.   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    DM (diabetes mellitus) (HCC)    Dyspnea    Hypertension    Lymphedema    MGUS (monoclonal gammopathy of unknown significance)    Mitral regurgitation    Pedal edema    PVC (premature ventricular contraction)    Past Surgical History:  Procedure Laterality Date   CESAREAN SECTION  RIGHT HEART CATH N/A 03/20/2024   Procedure: RIGHT HEART CATH;  Surgeon: Rolan Ezra RAMAN, MD;  Location: [facility] INVASIVE CV LAB;  Service: Cardiovascular;  Laterality: N/A;   RIGHT/LEFT HEART CATH AND CORONARY ANGIOGRAPHY N/A 10/13/2020   Procedure: RIGHT/LEFT HEART CATH AND CORONARY ANGIOGRAPHY;  Surgeon: Wonda Sharper, MD;  Location: [facility] INVASIVE CV LAB;  Service: Cardiovascular;  Laterality:  N/A;    Home Medications:  Prior to Admission medications  Medication Sig Start Date End Date Taking? Authorizing Provider  acetaminophen  (TYLENOL ) 325 MG tablet Take 2 tablets (650 mg total) by mouth every 6 (six) hours as needed for mild pain (pain score 1-3) or fever (or Fever >/= 101). 03/24/24   Danton Reyes DASEN, MD  apixaban  (ELIQUIS ) 5 MG TABS tablet Take 5 mg by mouth 2 (two) times daily.    [provider]  brimonidine  (ALPHAGAN ) 0.2 % ophthalmic solution Place 1 drop into both eyes 2 (two) times daily.    [provider]  furosemide  (LASIX ) 80 MG tablet Take 1 tablet (80 mg total) by mouth 2 (two) times daily. 03/24/24   Danton Reyes DASEN, MD  JARDIANCE  10 MG TABS tablet Take 10 mg by mouth in the morning. 07/20/20   [provider]  latanoprost  (XALATAN ) 0.005 % ophthalmic solution Place 1 drop into both eyes at bedtime.    [provider]  polyethylene glycol (MIRALAX  / GLYCOLAX ) 17 g packet Take 17 g by mouth daily as needed for mild constipation, moderate constipation or severe constipation. 03/24/24   Danton Reyes DASEN, MD   Scheduled Meds:  apixaban   5 mg Oral BID   brimonidine   1 drop Both Eyes BID   [START ON 04/05/2024] empagliflozin   10 mg Oral q AM   latanoprost   1 drop Both Eyes QHS   sodium chloride  flush  3 mL Intravenous Q12H   sodium chloride  flush  3 mL Intravenous Q12H   Continuous Infusions:  sodium chloride      PRN Meds: acetaminophen  **OR** acetaminophen , hydrALAZINE , HYDROmorphone  (DILAUDID ) injection, ipratropium, ondansetron  **OR** ondansetron  (ZOFRAN ) IV, oxyCODONE , senna-docusate, sodium phosphate, traZODone   Allergies:   Allergies[1]  Social History:   Social History   Socioeconomic History   Marital status: Widowed    Spouse name: Not on file   Number of children: Not on file   Years of education: Not on file   Highest education level: Not on file  Occupational History   Not on file  Tobacco Use    Smoking status: Never   Smokeless tobacco: Never  Substance and Sexual Activity   Alcohol use: Never   Drug use: Never   Sexual activity: Not on file  Other Topics Concern   Not on file  Social History Narrative   Not on file   Social Drivers of Health   Tobacco Use: Low Risk (04/04/2024)   Patient History    Smoking Tobacco Use: Never    Smokeless Tobacco Use: Never    Passive Exposure: Not on file  Financial Resource Strain: Not on file  Food Insecurity: Not on file  Transportation Needs: Not on file  Physical Activity: Not on file  Stress: Not on file  Social Connections: Not on file  Intimate Partner Violence: Not on file  Depression (EYV7-0): Not on file  Alcohol Screen: Not on file  Housing: Not on file  Utilities: Not on file  Health Literacy: Not on file    Family History:   History reviewed. No pertinent family history.  ROS:  Please see the history of present illness.  All other ROS reviewed and negative.     Physical Exam/Data: Vitals:   04/04/24 1645 04/04/24 1700 04/04/24 1715 04/04/24 1748  BP: 128/69 122/67 122/69 (!) 126/94  Pulse:      Resp: 15 15 16 18   Temp:    97.6 F (36.4 C)  TempSrc:    Oral  SpO2:      Weight:    62.8 kg  Height:    5' 4 (1.626 m)    Intake/Output Summary (Last 24 hours) at 04/04/2024 1823 Last data filed at 04/04/2024 1359 Gross per 24 hour  Intake 250 ml  Output --  Net 250 ml      04/04/2024    5:48 PM 04/04/2024    2:04 PM 04/02/2024    9:06 AM  Last 3 Weights  Weight (lbs) 138 lb 7.2 oz 147 lb 147 lb 6.4 oz  Weight (kg) 62.8 kg 66.679 kg 66.86 kg     Body mass index is 23.76 kg/m.   General:  in no acute distress HEENT: normal Neck: no JVD Vascular: No carotid bruits; Distal pulses 2+ bilaterally Cardiac:  normal S1, S2; iRRR, + murmur Lungs:  clear to auscultation bilaterally Abd: soft, nontender, no hepatomegaly  Ext: no edema Musculoskeletal:  No deformities Skin: warm and dry  Neuro:  no  focal abnormalities noted Psych:  Normal affect   Telemetry:  Telemetry was personally reviewed and demonstrates: Rate controlled A-fib, HR 60s to 70s  Relevant CV Studies:  Echocardiogram, 03/19/2024 Left ventricular ejection fraction, by estimation, is 60 to 65% . The left ventricle has normal function. The left ventricle has no regional wall motion abnormalities. There is moderate left ventricular hypertrophy of the basal- septal segment. Left ventricular diastolic parameters are indeterminate. Elevated left atrial pressure.  Right ventricular systolic function is mildly reduced. The right ventricular size is mildly enlarged. There is moderately elevated pulmonary artery systolic pressure.  Left atrial size was severely dilated.  Right atrial size was severely dilated.  A small pericardial effusion is present.  The mitral valve is normal in structure. Mild to moderate mitral valve regurgitation. No evidence of mitral stenosis.  Tricuspid valve regurgitation is moderate to severe.  The aortic valve is tricuspid. Aortic valve regurgitation is not visualized. Aortic valve sclerosis/ calcification is present, without any evidence of aortic stenosis.  Pulmonic valve regurgitation is moderate.  The inferior vena cava is dilated in size with < 50% respiratory variability, suggesting right atrial pressure of 15 mmHg.  RHC 03/20/2024 1. Elevated right and left heart filling pressures.  RV failure seems mildly out of proportion to LV failure.  2. Preserved PAPi 3. 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  4. Low but not markedly low cardiac output.   R/LHC, 10/13/2020 1.  Patent coronary arteries with mild diffuse plaquing and no significant stenosis (left dominant) 2.  High cardiac output 7.37 L/min, cardiac index 4.07 L/min/m 3.  No significant pulmonary hypertension with PA pressure 50/11 mean 25, transpulmonary gradient 12 mmHg, PVR 1.6 Wood units.   Overall  reassuring study.  Patient with known severe tricuspid regurgitation, but right atrial pressure is not substantially elevated and there are no large V waves in the right atrial pressure tracing.  Would continue current medical management.  Laboratory Data: High Sensitivity Troponin:  No results for input(s): TROPONINIHS in the last 720 hours.  Recent Labs  Lab 03/18/24 1619 03/18/24 2139 04/04/24 1447  TRNPT 41*  43* 51*      Chemistry Recent Labs  Lab 04/02/24 1010 04/04/24 1447  NA 138 136  K 5.4* 5.1  CL 95* 97*  CO2 32 31  GLUCOSE 110* 119*  BUN 42* 45*  CREATININE 1.41* 1.50*  CALCIUM  9.0 8.6*  MG  --  2.8*  GFRNONAA 37* 34*  ANIONGAP 11 8    Recent Labs  Lab 04/04/24 1447  PROT 8.6*  ALBUMIN  3.4*  AST 20  ALT 5  ALKPHOS 65  BILITOT 1.1   Lipids No results for input(s): CHOL, TRIG, HDL, LABVLDL, LDLCALC, CHOLHDL in the last 168 hours.  Hematology Recent Labs  Lab 04/04/24 1447  WBC 8.2  RBC 4.37  HGB 14.1  HCT 44.0  MCV 100.7*  MCH 32.3  MCHC 32.0  RDW 13.9  PLT 235   Thyroid   Recent Labs  Lab 04/04/24 1447  TSH 4.940*    BNPNo results for input(s): BNP, PROBNP in the last 168 hours.  DDimer No results for input(s): DDIMER in the last 168 hours.  Radiology/Studies:  DG Chest 2 View Result Date: 04/04/2024 CLINICAL DATA:  Syncope EXAM: CHEST - 2 VIEW COMPARISON:  03/18/2024 FINDINGS: Mild-to-moderate cardiomegaly unchanged from prior examination. Mediastinal silhouette within normal limits. No pulmonary vascular congestion. Lungs are clear. IMPRESSION: Unchanged mild-to-moderate cardiomegaly Electronically Signed   By: Aliene Lloyd M.D.   On: 04/04/2024 15:53   Assessment and Plan:  Syncope Presented with witnessed syncopal episode  Noted to have HR 34-40, given atropine in field BP 70/40 improved with IV fluids per EMS Previously on Entresto , had to get decreased due to postural dizziness then fully discontinued due to  hyperkalemia  Given history and vitals at scene, improving with IV fluids, suspect that patient was likely overdiuresed in the setting of Lasix  use Continue to monitor on telemetry   Chronic HFpEF  RV failure  Echo 1/26 EF 60-65%, mod LVH of the basal-septal segment, RV mildly reduced RHC demonstrated elevated R+ L heart filling pressures w/ RV failure mildly out of proportion to LV failure, preserved PAPi and low but not markedly low cardiac output (RA 14, PA 65/16/32, PCWP 19, FICK CI 2.1, PAPi 3.5) Home meds: Jardiance  10 mg daily, PO Lasix  80 mg daily Previously on Entresto , had to get decreased due to postural dizziness then fully discontinued due to hyperkalemia  Continue to monitor daily weights, strict I&O's, daily BMPs Continue Jardaince 10 mg daily Hold off on any Lasix  for today, reevaluate in the morning, may need to reduce PO Lasix  dose to 40-60 mg daily  Pulmonary HTN RHC c/w mixed pulmonary venous/pulmonary arterial hypertension, definite WHO GROUP 2 component  RHC PVR 3.3 WU  V/Q scan 1/26 negative for acute PE/ CTEPH (WHO Group 4) Connective tissue disorder studies fairly unremarkable (only RF elevated) Undergoing further workup at outpatient   Permanent A-Fib Rate controlled without any AV nodal agents On Eliquis  for AC Continue to monitor on telemetry  Continue PTA Eliquis    Risk Assessment/Risk Scores:      New [place]  Heart Association (NYHA) Functional Class NYHA Class II  CHA2DS2-VASc Score =6     For questions or updates, please contact [facility] Please consult [email or web address] for contact info under   Signed, Waddell [name] Donath, PA-C  04/04/2024 6:23 PM     [1] No Known Allergies  "

## 2024-04-04 NOTE — ED Notes (Signed)
"  Urine sample sent to lab.  "

## 2024-04-04 NOTE — Assessment & Plan Note (Addendum)
-   Current home medication reviewed, on Lasix  80 mg p.o. twice daily, mild elevation in creatinine 1.4 -Holding today's dose of Lasix  -May resume at lower dose in a.m.  -Last Echo March 19, 2024, EF 60-65%, normal LV function, moderate LVH, mild RV enlargement, reduced function pulmonary artery hypertension, left and right atrium severely dilated, small pericardial effusion.  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-recommend continue aggressive diuresis

## 2024-04-04 NOTE — ED Provider Notes (Signed)
 Care transferred to me.  Patient has been stable since arrival.  Labs overall unremarkable.  Cardiology consulted for the bradycardia with syncope.  Discussed with hospitalist, Dr. Willette, who will admit.   Freddi Hamilton, MD 04/04/24 [phone]

## 2024-04-04 NOTE — Assessment & Plan Note (Signed)
 TSH at 4.94 -Repeating TSH, obtaining free T3, T4

## 2024-04-04 NOTE — Assessment & Plan Note (Signed)
 Worsening kidney function from baseline -BUN 45, creatinine 1.50, GFR 34 -Will try to avoid nephrotoxins, hypotension Unfortunately on 80 mg of Lasix  twice daily, holding tonight's dose, may reinitiate at lower dose -Entresto -not listed

## 2024-04-04 NOTE — Assessment & Plan Note (Signed)
 Witnessed syncope and collapse -Likely due to bradycardia (no recorded strips)-reported heart rate in 30s-40s Patient received atropine in the field -EKG in ED A-fib with rate of 54, QTc of 464, -Monitor electrolytes keeping K>4.0, mag >2.0 (Current serum potassium 5.4, magnesium 2.8) - TSH elevated at 4.94  -Patient was recently taken off beta-blockers - Cardiology consulted, appreciate further evaluation recommendation

## 2024-04-05 DIAGNOSIS — I10 Essential (primary) hypertension: Secondary | ICD-10-CM | POA: Diagnosis not present

## 2024-04-05 DIAGNOSIS — I4821 Permanent atrial fibrillation: Secondary | ICD-10-CM | POA: Diagnosis not present

## 2024-04-05 DIAGNOSIS — R7989 Other specified abnormal findings of blood chemistry: Secondary | ICD-10-CM

## 2024-04-05 DIAGNOSIS — R55 Syncope and collapse: Secondary | ICD-10-CM | POA: Diagnosis not present

## 2024-04-05 DIAGNOSIS — I272 Pulmonary hypertension, unspecified: Secondary | ICD-10-CM | POA: Diagnosis not present

## 2024-04-05 DIAGNOSIS — I5032 Chronic diastolic (congestive) heart failure: Secondary | ICD-10-CM | POA: Diagnosis not present

## 2024-04-05 LAB — BASIC METABOLIC PANEL WITH GFR
Anion gap: 8 (ref 5–15)
BUN: 55 mg/dL — ABNORMAL HIGH (ref 8–23)
CO2: 30 mmol/L (ref 22–32)
Calcium: 8.3 mg/dL — ABNORMAL LOW (ref 8.9–10.3)
Chloride: 99 mmol/L (ref 98–111)
Creatinine, Ser: 1.54 mg/dL — ABNORMAL HIGH (ref 0.44–1.00)
GFR, Estimated: 33 mL/min — ABNORMAL LOW
Glucose, Bld: 87 mg/dL (ref 70–99)
Potassium: 4.4 mmol/L (ref 3.5–5.1)
Sodium: 137 mmol/L (ref 135–145)

## 2024-04-05 LAB — GLUCOSE, CAPILLARY: Glucose-Capillary: 91 mg/dL (ref 70–99)

## 2024-04-05 LAB — T3, FREE: T3, Free: 3.1 pg/mL (ref 2.0–4.4)

## 2024-04-05 LAB — CBC
HCT: 41.1 % (ref 36.0–46.0)
Hemoglobin: 13.4 g/dL (ref 12.0–15.0)
MCH: 32.4 pg (ref 26.0–34.0)
MCHC: 32.6 g/dL (ref 30.0–36.0)
MCV: 99.3 fL (ref 80.0–100.0)
Platelets: 226 10*3/uL (ref 150–400)
RBC: 4.14 MIL/uL (ref 3.87–5.11)
RDW: 13.9 % (ref 11.5–15.5)
WBC: 4.7 10*3/uL (ref 4.0–10.5)
nRBC: 0 % (ref 0.0–0.2)

## 2024-04-05 LAB — TSH: TSH: 2.27 u[IU]/mL (ref 0.350–4.500)

## 2024-04-05 NOTE — Progress Notes (Signed)
 [facility]  PROGRESS NOTE  [name] Long FMW:[id number] DOB: [birth year] DOA: 04/04/2024 PCP: Patient, No Pcp Per   Brief HPI:    84 year old female with extensive history of A-fib (on Eliquis ), cardiomyopathy, DM II, HTN, pulmonary HTN, lymphedema, MGUS .... Presented from home with syncopal episode. Patient had a witnessed lightheadedness as watching TV she was eased to the ground.  Did not fell hit her head. Patient was found bradycardic with heart rate of 35 - 40s, atropine was given the field.  Mildly hypotensive.   She was recently taken off beta-blockers due to bradycardia on the last admission. Denies any headaches visual changes or asymmetric weaknesses.   Recent hospitalization discharge from 1/13- 1/19 for pulmonary hypertension, s/p right cardiac cath,, discontinued beta-blocker due to bradycardia,  EKG in ED A-fib with a rate of 54, incomplete right bundle, QTc 464,     Assessment/Plan:    Syncope and collapse Witnessed syncope and collapse -Unclear etiology, cardiology feels likely vasovagal versus diuretic use -Will check orthostatic vital signs -bradycardia (no recorded strips)-reported heart rate in 30s-40s Patient received atropine in the field -EKG in ED A-fib with rate of 54, QTc of 464, -Monitor electrolytes keeping K>4.0, mag >2.0 (Current serum potassium 5.4, magnesium 2.8) - TSH elevated at 4.94  -Patient was recently taken off beta-blockers - Cardiology consulted, appreciate further evaluation recommendation   Essential hypertension - BP stable -Beta-blocker discontinued on previous admission, no ACE i/ARB Supposed to be on Entresto -not on med list currently Pending med verification Appreciate cardiology input  History of CHF -Lasix  on hold due to syncope  -Last Echo March 19, 2024, EF 60-65%, normal LV function, moderate LVH, mild RV enlargement, reduced function pulmonary artery hypertension, left and right atrium severely dilated, small  pericardial effusion.  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-recommend continue aggressive diuresis   Stage 3 chronic kidney disease (HCC) Worsening kidney function from baseline -BUN 45, creatinine 1.50, GFR 34 -Will try to avoid nephrotoxins, hypotension Unfortunately on 80 mg of Lasix  twice daily, holding tonight's dose, may reinitiate at lower dose -Entresto -not listed   Persistent atrial fibrillation (HCC) Bradycardia -likely led to syncopal episode -Patient has been taken off beta-blockers on previous admission -Continue Eliquis  -appreciate cardiology evaluation recommendations     Hypercholesterolemia Hyperlipidemia with hyper -Lipoprotein (a) -disorder Statin not listed -Will check lipid panel   Elevated TSH TSH at 4.94 -Repeating TSH, obtaining free T3, T4         DVT prophylaxis: Apixaban   Medications     apixaban   5 mg Oral BID   brimonidine   1 drop Both Eyes BID   empagliflozin   10 mg Oral q AM   latanoprost   1 drop Both Eyes QHS   sodium chloride  flush  3 mL Intravenous Q12H   sodium chloride  flush  3 mL Intravenous Q12H     Data Reviewed:   CBG:  Recent Labs  Lab 04/05/24 0602  GLUCAP 91    SpO2: 100 %    Vitals:   04/04/24 2326 04/05/24 0300 04/05/24 0345 04/05/24 0805  BP: (!) 105/58  122/67 (!) 143/72  Pulse: 62  62 66  Resp: 14  15 20   Temp: 98 F (36.7 C)  97.8 F (36.6 C) 98.3 F (36.8 C)  TempSrc: Oral  Oral Oral  SpO2: 100%  100% 100%  Weight:  62.7  kg    Height:          Data Reviewed:  Basic Metabolic Panel: Recent Labs  Lab 04/02/24 1010 04/04/24 1447 04/04/24 1628 04/05/24 0226  NA 138 136  --  137  K 5.4* 5.1  --  4.4  CL 95* 97*  --  99  CO2 32 31  --  30  GLUCOSE 110* 119*  --  87  BUN 42* 45*  --  55*  CREATININE 1.41* 1.50*  --  1.54*  CALCIUM   9.0 8.6*  --  8.3*  MG  --  2.8* 2.7*  --   PHOS  --   --  4.3  --     CBC: Recent Labs  Lab 04/04/24 1447 04/05/24 0226  WBC 8.2 4.7  NEUTROABS 6.5  --   HGB 14.1 13.4  HCT 44.0 41.1  MCV 100.7* 99.3  PLT 235 226    LFT Recent Labs  Lab 04/04/24 1447  AST 20  ALT 5  ALKPHOS 65  BILITOT 1.1  PROT 8.6*  ALBUMIN  3.4*     Antibiotics: Anti-infectives (From admission, onward)    None        CONSULTS   Code Status: Full code  Family Communication:      Subjective   Patient seen and examined, denies dizziness or chest pain.   Objective    Physical Examination:   General-appears in no acute distress Heart-S1-S2, regular, no murmur auscultated Lungs-clear to auscultation bilaterally, no wheezing or crackles auscultated Abdomen-soft, nontender, no organomegaly Extremities-no edema in the lower extremities Neuro-alert, oriented x3, no focal deficit noted            [name] Long S [name] Long   [facility] If 7PM-7AM, please contact night-coverage at [email or web address], Office  [phone]   04/05/2024, 10:02 AM  LOS: 1 day

## 2024-04-05 NOTE — Progress Notes (Signed)
 "  Progress Note  Patient Name: [name] Long Date of Encounter: 04/05/2024  Primary Cardiologist: Ozell Fell, MD   Subjective   Patient seen and examined at bedside.  She was lying in bed when I arrived.  She offers no specific complaints.  Inpatient Medications    Scheduled Meds:  apixaban   5 mg Oral BID   brimonidine   1 drop Both Eyes BID   empagliflozin   10 mg Oral q AM   latanoprost   1 drop Both Eyes QHS   sodium chloride  flush  3 mL Intravenous Q12H   sodium chloride  flush  3 mL Intravenous Q12H   Continuous Infusions:  PRN Meds: acetaminophen  **OR** acetaminophen , hydrALAZINE , HYDROmorphone  (DILAUDID ) injection, ipratropium, ondansetron  **OR** ondansetron  (ZOFRAN ) IV, oxyCODONE , senna-docusate, sodium phosphate, traZODone    Vital Signs    Vitals:   04/04/24 2326 04/05/24 0300 04/05/24 0345 04/05/24 0805  BP: (!) 105/58  122/67 (!) 143/72  Pulse: 62  62 66  Resp: 14  15 20   Temp: 98 F (36.7 C)  97.8 F (36.6 C) 98.3 F (36.8 C)  TempSrc: Oral  Oral Oral  SpO2: 100%  100% 100%  Weight:  62.7 kg    Height:        Intake/Output Summary (Last 24 hours) at 04/05/2024 1140 Last data filed at 04/04/2024 1359 Gross per 24 hour  Intake 250 ml  Output --  Net 250 ml   Filed Weights   04/04/24 1404 04/04/24 1748 04/05/24 0300  Weight: 66.7 kg 62.8 kg 62.7 kg    Telemetry    Atrial fibrillation - Personally Reviewed  ECG     - Personally Reviewed  Physical Exam    General: Comfortable Head: Atraumatic, normal size  Eyes: PEERLA, EOMI  Neck: Supple, normal JVD Cardiac: Normal S1, S2; RRR; no murmurs, rubs, or gallops Lungs: Clear to auscultation bilaterally Abd: Soft, nontender, no hepatomegaly  Ext: warm, no edema  Labs    Chemistry Recent Labs  Lab 04/02/24 1010 04/04/24 1447 04/05/24 0226  NA 138 136 137  K 5.4* 5.1 4.4  CL 95* 97* 99  CO2 32 31 30  GLUCOSE 110* 119* 87  BUN 42* 45* 55*  CREATININE 1.41* 1.50* 1.54*  CALCIUM  9.0  8.6* 8.3*  PROT  --  8.6*  --   ALBUMIN   --  3.4*  --   AST  --  20  --   ALT  --  5  --   ALKPHOS  --  65  --   BILITOT  --  1.1  --   GFRNONAA 37* 34* 33*  ANIONGAP 11 8 8      Hematology Recent Labs  Lab 04/04/24 1447 04/05/24 0226  WBC 8.2 4.7  RBC 4.37 4.14  HGB 14.1 13.4  HCT 44.0 41.1  MCV 100.7* 99.3  MCH 32.3 32.4  MCHC 32.0 32.6  RDW 13.9 13.9  PLT 235 226    Cardiac EnzymesNo results for input(s): TROPONINI in the last 168 hours. No results for input(s): TROPIPOC in the last 168 hours.   BNP Recent Labs  Lab 04/04/24 1628  PROBNP 926.0*     DDimer No results for input(s): DDIMER in the last 168 hours.   Radiology    DG Chest 2 View Result Date: 04/04/2024 CLINICAL DATA:  Syncope EXAM: CHEST - 2 VIEW COMPARISON:  03/18/2024 FINDINGS: Mild-to-moderate cardiomegaly unchanged from prior examination. Mediastinal silhouette within normal limits. No pulmonary vascular congestion. Lungs are clear. IMPRESSION: Unchanged mild-to-moderate cardiomegaly Electronically Signed  By: Aliene  Mir M.D.   On: 04/04/2024 15:53    Cardiac Studies   Echo and right heart cath   Patient Profile     84 y.o. female with recent syncope and also being treated for acute heart failure exacerbation  Assessment & Plan    Syncope Acute on chronic Diastolic Heart failure  Pulmonary Hypertension: Mixed etiology  Permanent Atrial fibrillation   Etiology of her syncope still unclear, suspicion for vasovagal syncope is high but at the same time she would benefit from wearing a monitor for atleast 30 days post hospitalization if her tele remains relatively benign with only atrial fibrillation. Discussed how volume depletion from poor po intake and use of diuretics can lead to this as well Agree with holding diuretics and prior AV nodal agent discontinuation.   Clinically euvolemic   Heart rate is controlled, continue Eliquis . Agree with keep off AV nodal blockers. She can  benefit from ambulating.    For questions or updates, please contact [facility] Please consult [email or web address] for contact info under Cardiology/STEMI.      Signed, Seville Brick, DO  04/05/2024, 11:40 AM    "

## 2024-04-05 NOTE — Progress Notes (Signed)
 Upon chaplain arrival, Pt was eating lunch and was alert. Chaplain briefly introduced advance directive (AD) . [name] Long preferred to continue the conversation at a later time. Chaplain will follow up later today to provide education and support regarding AD.

## 2024-04-06 ENCOUNTER — Other Ambulatory Visit: Payer: Self-pay | Admitting: Cardiology

## 2024-04-06 DIAGNOSIS — R7989 Other specified abnormal findings of blood chemistry: Secondary | ICD-10-CM | POA: Diagnosis not present

## 2024-04-06 DIAGNOSIS — I4821 Permanent atrial fibrillation: Secondary | ICD-10-CM

## 2024-04-06 DIAGNOSIS — I5032 Chronic diastolic (congestive) heart failure: Secondary | ICD-10-CM | POA: Diagnosis not present

## 2024-04-06 DIAGNOSIS — I10 Essential (primary) hypertension: Secondary | ICD-10-CM | POA: Diagnosis not present

## 2024-04-06 DIAGNOSIS — R55 Syncope and collapse: Secondary | ICD-10-CM | POA: Diagnosis not present

## 2024-04-06 DIAGNOSIS — I272 Pulmonary hypertension, unspecified: Secondary | ICD-10-CM | POA: Diagnosis not present

## 2024-04-06 LAB — BASIC METABOLIC PANEL WITH GFR
Anion gap: 9 (ref 5–15)
BUN: 50 mg/dL — ABNORMAL HIGH (ref 8–23)
CO2: 30 mmol/L (ref 22–32)
Calcium: 8.3 mg/dL — ABNORMAL LOW (ref 8.9–10.3)
Chloride: 97 mmol/L — ABNORMAL LOW (ref 98–111)
Creatinine, Ser: 1.32 mg/dL — ABNORMAL HIGH (ref 0.44–1.00)
GFR, Estimated: 40 mL/min — ABNORMAL LOW
Glucose, Bld: 115 mg/dL — ABNORMAL HIGH (ref 70–99)
Potassium: 4.2 mmol/L (ref 3.5–5.1)
Sodium: 136 mmol/L (ref 135–145)

## 2024-04-06 LAB — CBC
HCT: 41.3 % (ref 36.0–46.0)
Hemoglobin: 13.5 g/dL (ref 12.0–15.0)
MCH: 32 pg (ref 26.0–34.0)
MCHC: 32.7 g/dL (ref 30.0–36.0)
MCV: 97.9 fL (ref 80.0–100.0)
Platelets: 246 10*3/uL (ref 150–400)
RBC: 4.22 MIL/uL (ref 3.87–5.11)
RDW: 13.8 % (ref 11.5–15.5)
WBC: 4.7 10*3/uL (ref 4.0–10.5)
nRBC: 0 % (ref 0.0–0.2)

## 2024-04-06 MED ORDER — FUROSEMIDE 80 MG PO TABS: 80.0000 mg | ORAL_TABLET | Freq: Every day | ORAL | Status: AC

## 2024-04-06 NOTE — TOC Transition Note (Signed)
 Transition of Care [facility]) - Discharge Note   Patient Details  Name: [name] Long MRN: [id number] Date of Birth: 30-Jun-1940  Transition of Care [facility]) CM/SW Contact:  Marval Gell, RN Phone Number: 04/06/2024, 12:48 PM   Clinical Narrative:     SPoke w patient's daughter. She states she was DC'd from SNF on Friday and set up with [facility] but has not had a chance to start due to rehospitalization.  Placed ROS order for HH and added to HUB and AVS.  Daughter states that family will provide transportation home          Patient Goals and CMS Choice            Discharge Placement                       Discharge Plan and Services Additional resources added to the After Visit Summary for                                       Social Drivers of Health (SDOH) Interventions SDOH Screenings   Food Insecurity: No Food Insecurity (04/05/2024)  Housing: Unknown (04/05/2024)  Transportation Needs: No Transportation Needs (04/05/2024)  Utilities: Not At Risk (04/05/2024)  Social Connections: Unknown (04/05/2024)  Tobacco Use: Low Risk (04/04/2024)     Readmission Risk Interventions    03/19/2024    3:29 PM  Readmission Risk Prevention Plan  Post Dischage Appt Complete  Medication Screening Complete  Transportation Screening Complete

## 2024-04-06 NOTE — Progress Notes (Signed)
Discharge instructions provided to patient. All medications, follow up appointments, and discharge instructions provided. IV out. Monitor off CCMD notified. Discharging home.

## 2024-04-06 NOTE — Discharge Summary (Signed)
 " Physician Discharge Summary   Patient: [name] Long MRN: [id number] DOB: Dec 04, 1940  Admit date:     04/04/2024  Discharge date: 04/06/24  Discharge Physician: Sabas GORMAN Brod   PCP: Patient, No Pcp Per   Recommendations at discharge:   Follow-up PCP as outpatient Cardiology will mail 14-day monitor at home  Discharge Diagnoses: Principal Problem:   Syncope and collapse Active Problems:   Acute CHF (congestive heart failure) (HCC)   Essential hypertension   Hypercholesterolemia   Persistent atrial fibrillation (HCC)   Stage 3 chronic kidney disease (HCC)   Elevated TSH   Permanent atrial fibrillation (HCC)   Chronic diastolic heart failure (HCC)   Chronic right heart failure (HCC)   Pulmonary hypertension, unspecified (HCC)  Resolved Problems:   * No resolved hospital problems. *  Hospital Course:  84 year old female with extensive history of A-fib (on Eliquis ), cardiomyopathy, DM II, HTN, pulmonary HTN, lymphedema, MGUS .... Presented from home with syncopal episode. Patient had a witnessed lightheadedness as watching TV she was eased to the ground.  Did not fell hit her head. Patient was found bradycardic with heart rate of 35 - 40s, atropine was given the field.  Mildly hypotensive.   She was recently taken off beta-blockers due to bradycardia on the last admission. Denies any headaches visual changes or asymmetric weaknesses.   Recent hospitalization discharge from 1/13- 1/19 for pulmonary hypertension, s/p right cardiac cath,, discontinued beta-blocker due to bradycardia,  EKG in ED A-fib with a rate of 54, incomplete right bundle, QTc 464,   Assessment and Plan:   Syncope and collapse Witnessed syncope and collapse -Unclear etiology, cardiology feels likely vasovagal versus diuretic use -Orthostatic vital signs obtained were negative -bradycardia (no recorded strips)-reported heart rate in 30s-40s Patient received atropine in the field -EKG in ED A-fib with rate  of 54, QTc of 464,  -Patient was recently taken off beta-blockers - Cardiology consulted, recommend to discharge home -14-day heart monitor will be mailed per cardiology   Essential hypertension - BP stable -Beta-blocker discontinued on previous admission, no ACE i/ARB Supposed to be on Entresto  Appreciate cardiology input   History of CHF -Lasix  on hold due to syncope  -Last Echo March 19, 2024, EF 60-65%, normal LV function, moderate LVH, mild RV enlargement, reduced function pulmonary artery hypertension, left and right atrium severely dilated, small pericardial effusion.  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-recommend continue aggressive diuresis -Discharged on Lasix  80 mg daily, Entresto  49/51 mg twice daily, furosemide  80 mg daily   Stage 3 chronic kidney disease (HCC) Worsening kidney function from baseline -BUN 45, creatinine 1.50, GFR 34 -Will try to avoid nephrotoxins, hypotension Unfortunately on 80 mg of Lasix  twice daily -Cardiology has changed the dose to Lasix  80 mg daily    Persistent atrial fibrillation (HCC) Bradycardia -likely led to syncopal episode -Patient has been taken off beta-blockers on previous admission -Continue Eliquis   Elevated TSH TSH 2.270, free T41.58, free T33.1             Consultants:  Procedures performed:   Disposition: Home Diet recommendation:  Regular diet DISCHARGE MEDICATION: Allergies as of 04/06/2024   No Known Allergies      Medication List     TAKE these medications    acetaminophen  325 MG tablet Commonly known as: TYLENOL  Take 2 tablets (650 mg total) by  mouth every 6 (six) hours as needed for mild pain (pain score 1-3) or fever (or Fever >/= 101).   apixaban  5 MG Tabs tablet Commonly known as: ELIQUIS  Take 5 mg by mouth 2 (two) times daily.    brimonidine  0.2 % ophthalmic solution Commonly known as: ALPHAGAN  Place 1 drop into both eyes 2 (two) times daily.   Entresto  49-51 MG Generic drug: sacubitril -valsartan  Take 1 tablet by mouth 2 (two) times daily.   furosemide  80 MG tablet Commonly known as: LASIX  Take 1 tablet (80 mg total) by mouth daily. What changed: when to take this   Jardiance  10 MG Tabs tablet Generic drug: empagliflozin  Take 10 mg by mouth in the morning.   latanoprost  0.005 % ophthalmic solution Commonly known as: XALATAN  Place 1 drop into both eyes at bedtime.   polyethylene glycol 17 g packet Commonly known as: MIRALAX  / GLYCOLAX  Take 17 g by mouth daily as needed for mild constipation, moderate constipation or severe constipation.        Discharge Exam: Filed Weights   04/04/24 1404 04/04/24 1748 04/05/24 0300  Weight: 66.7 kg 62.8 kg 62.7 kg   General-appears in no acute distress Heart-S1-S2, regular, no murmur auscultated Lungs-clear to auscultation bilaterally, no wheezing or crackles auscultated Abdomen-soft, nontender, no organomegaly Extremities-no edema in the lower extremities Neuro-alert, oriented x3, no focal deficit noted  Condition at discharge: good  The results of significant diagnostics from this hospitalization (including imaging, microbiology, ancillary and laboratory) are listed below for reference.   Imaging Studies: DG Chest 2 View Result Date: 04/04/2024 CLINICAL DATA:  Syncope EXAM: CHEST - 2 VIEW COMPARISON:  03/18/2024 FINDINGS: Mild-to-moderate cardiomegaly unchanged from prior examination. Mediastinal silhouette within normal limits. No pulmonary vascular congestion. Lungs are clear. IMPRESSION: Unchanged mild-to-moderate cardiomegaly Electronically Signed   By: Aliene Lloyd M.D.   On: 04/04/2024 15:53   CARDIAC CATHETERIZATION Result Date: 03/20/2024 1. Elevated right and left heart filling pressures.  RV failure seems mildly out of proportion to LV failure.  2. Preserved PAPi 3. 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 4. Low but not markedly low cardiac output. Would continue diuresis, Lasix  increased to 80 mg IV bid.   ECHOCARDIOGRAM COMPLETE Result Date: 03/19/2024    ECHOCARDIOGRAM REPORT   Patient Name:   Legaci Tarman Date of Exam: 03/19/2024 Medical Rec #:  [id number]   Height:       64.0 in Accession #:    [id number]  Weight:       184.5 lb Date of Birth:  12-Dec-1940   BSA:          1.891 m Patient Age:    83 years    BP:           152/63 mmHg Patient Gender: F           HR:           211 bpm. Exam Location:  Inpatient Procedure: 2D Echo, Cardiac Doppler and Color Doppler (Both Spectral and Color            Flow Doppler were utilized during procedure). Indications:    Mitral valve insufficiency  History:        Patient has prior history of Echocardiogram examinations, most                 recent 12/31/2023. CHF, Arrythmias:Atrial Fibrillation; Risk                 Factors:Hypertension. CKD.  Sonographer:  Philomena Daring Referring Phys: [facility] J PATWARDHAN IMPRESSIONS  1. Left ventricular ejection fraction, by estimation, is 60 to 65%. The left ventricle has normal function. The left ventricle has no regional wall motion abnormalities. There is moderate left ventricular hypertrophy of the basal-septal segment. Left ventricular diastolic parameters are indeterminate. Elevated left atrial pressure.  2. Right ventricular systolic function is mildly reduced. The right ventricular size is mildly enlarged. There is moderately elevated pulmonary artery systolic pressure.  3. Left atrial size was severely dilated.  4. Right atrial size was severely dilated.  5. A small pericardial effusion is present.  6. The mitral valve is normal in structure. Mild to moderate mitral valve regurgitation. No evidence of mitral stenosis.  7. Tricuspid valve regurgitation is moderate to severe.  8. The aortic valve is tricuspid. Aortic valve regurgitation is not  visualized. Aortic valve sclerosis/calcification is present, without any evidence of aortic stenosis.  9. Pulmonic valve regurgitation is moderate. 10. The inferior vena cava is dilated in size with <50% respiratory variability, suggesting right atrial pressure of 15 mmHg. FINDINGS  Left Ventricle: Left ventricular ejection fraction, by estimation, is 60 to 65%. The left ventricle has normal function. The left ventricle has no regional wall motion abnormalities. The left ventricular internal cavity size was normal in size. There is  moderate left ventricular hypertrophy of the basal-septal segment. Left ventricular diastolic parameters are indeterminate. Elevated left atrial pressure. Right Ventricle: The right ventricular size is mildly enlarged. Right ventricular systolic function is mildly reduced. There is moderately elevated pulmonary artery systolic pressure. The tricuspid regurgitant velocity is 3.07 m/s, and with an assumed right atrial pressure of 15 mmHg, the estimated right ventricular systolic pressure is 52.7 mmHg. Left Atrium: Left atrial size was severely dilated. Right Atrium: Right atrial size was severely dilated. Pericardium: A small pericardial effusion is present. Mitral Valve: The mitral valve is normal in structure. Mild to moderate mitral valve regurgitation. No evidence of mitral valve stenosis. Tricuspid Valve: The tricuspid valve is normal in structure. Tricuspid valve regurgitation is moderate to severe. No evidence of tricuspid stenosis. Aortic Valve: The aortic valve is tricuspid. Aortic valve regurgitation is not visualized. Aortic valve sclerosis/calcification is present, without any evidence of aortic stenosis. Pulmonic Valve: The pulmonic valve was normal in structure. Pulmonic valve regurgitation is moderate. No evidence of pulmonic stenosis. Aorta: The aortic root is normal in size and structure. Venous: The inferior vena cava is dilated in size with less than 50% respiratory  variability, suggesting right atrial pressure of 15 mmHg. IAS/Shunts: No atrial level shunt detected by color flow Doppler.  LEFT VENTRICLE PLAX 2D LVIDd:         4.40 cm     Diastology LVIDs:         3.20 cm     LV e' medial:    7.07 cm/s LV PW:         0.80 cm     LV E/e' medial:  17.7 LV IVS:        1.40 cm     LV e' lateral:   12.90 cm/s LVOT diam:     1.80 cm     LV E/e' lateral: 9.7 LV SV:         57 LV SV Index:   30 LVOT Area:     2.54 cm  LV Volumes (MOD) LV vol d, MOD A2C: 80.9 ml LV vol d, MOD A4C: 77.9 ml LV vol s, MOD A2C: 32.9 ml LV vol  s, MOD A4C: 32.5 ml LV SV MOD A2C:     48.0 ml LV SV MOD A4C:     77.9 ml LV SV MOD BP:      46.3 ml RIGHT VENTRICLE             IVC RV Basal diam:  4.00 cm     IVC diam: 3.10 cm RV S prime:     10.10 cm/s TAPSE (M-mode): 1.8 cm LEFT ATRIUM              Index        RIGHT ATRIUM           Index LA diam:        4.30 cm  2.27 cm/m   RA Area:     28.90 cm LA Vol (A2C):   119.0 ml 62.94 ml/m  RA Volume:   89.30 ml  47.24 ml/m LA Vol (A4C):   101.0 ml 53.42 ml/m LA Biplane Vol: 115.0 ml 60.83 ml/m  AORTIC VALVE             PULMONIC VALVE LVOT Vmax:   83.40 cm/s  PR End Diast Vel: 2.14 msec LVOT Vmean:  57.200 cm/s LVOT VTI:    0.225 m  AORTA Ao Root diam: 2.50 cm Ao Asc diam:  3.10 cm MITRAL VALVE                  TRICUSPID VALVE MV Area (PHT): 3.79 cm       TR Peak grad:   37.7 mmHg MV Decel Time: 200 msec       TR Vmax:        307.00 cm/s MR Peak grad:    117.5 mmHg MR Mean grad:    90.0 mmHg    SHUNTS MR Vmax:         542.00 cm/s  Systemic VTI:  0.22 m MR Vmean:        459.0 cm/s   Systemic Diam: 1.80 cm MR PISA:         1.57 cm MR PISA Eff ROA: 9 mm MR PISA Radius:  0.50 cm MV E velocity: 125.00 cm/s Redell Shallow MD Electronically signed by Redell Shallow MD Signature Date/Time: 03/19/2024/1:54:17 PM    Final    NM Pulmonary Perfusion Result Date: 03/19/2024 EXAM: NM Lung Perfusion Scan. CLINICAL HISTORY: Pulmonary embolism (PE) suspected, high prob;  Syncope/presyncope, cerebrovascular cause suspected. TECHNIQUE: Radiolabeled MAA was administered intravenously and planar images of the lungs were obtained in multiple projections. RADIOPHARMACEUTICAL: 4.38 mCi Tc-38m MAA at 0915, IV RAC HB. COMPARISON: None provided. FINDINGS: PERFUSION: Large left lung attenuation defect related to cardiomegaly. No wedge-shaped peripheral perfusion defect is just acute on the limb. IMPRESSION: 1. No evidence of acute pulmonary embolism. 2. Large left lung attenuation defect related to cardiomegaly. Electronically signed by: Norleen Boxer MD 03/19/2024 10:38 AM EST RP Workstation: [id number]   CT Chest Wo Contrast Result Date: 03/18/2024 CLINICAL DATA:  Shortness of breath. EXAM: CT CHEST WITHOUT CONTRAST TECHNIQUE: Multidetector CT imaging of the chest was performed following the standard protocol without IV contrast. RADIATION DOSE REDUCTION: This exam was performed according to the departmental dose-optimization program which includes automated exposure control, adjustment of the mA and/or kV according to patient size and/or use of iterative reconstruction technique. COMPARISON:  None Available. FINDINGS: Evaluation of this exam is limited in the absence of intravenous contrast. Cardiovascular: There is cardiomegaly. Small pericardial effusion. There is coronary vascular calcification. Moderate atherosclerotic calcification  of the thoracic aorta. Lungs 1 dilatation. The central pulmonary arteries are grossly unremarkable. Mediastinum/Nodes: No hilar or mediastinal adenopathy. Evaluation however is limited in the absence of intravenous contrast. The esophagus is grossly unremarkable. No mediastinal fluid collection. Lungs/Pleura: No focal consolidation, pleural effusion, pneumothorax. Small lung nodules or nodular scarring in the anterior right middle lobe measure up to 4 mm and present on the prior CT. The central airways are patent. Upper Abdomen: No acute abnormality.  Musculoskeletal: Diffuse subcutaneous edema and anasarca. Osteopenia with degenerative changes of the spine. No acute osseous pathology. IMPRESSION: 1. No acute intrathoracic pathology. 2. Cardiomegaly with small pericardial effusion. 3.  Aortic Atherosclerosis (ICD10-I70.0). Electronically Signed   By: Vanetta Chou M.D.   On: 03/18/2024 16:09   CT Head Wo Contrast Result Date: 03/18/2024 CLINICAL DATA:  Head trauma, minor (Age >= 65y) EXAM: CT HEAD WITHOUT CONTRAST TECHNIQUE: Contiguous axial images were obtained from the base of the skull through the vertex without intravenous contrast. RADIATION DOSE REDUCTION: This exam was performed according to the departmental dose-optimization program which includes automated exposure control, adjustment of the mA and/or kV according to patient size and/or use of iterative reconstruction technique. COMPARISON:  None Available. FINDINGS: Brain: Diffuse brain atrophy pattern and periventricular white matter microvascular ischemic changes. No acute intracranial hemorrhage, new infarction, mass lesion, midline shift, herniation, hydrocephalus, or extra-axial fluid collection. No focal mass effect or edema. Cisterns are patent. Cerebellar atrophy as well. Vascular: No hyperdense vessel or unexpected calcification. Skull: Normal. Negative for fracture or focal lesion. Sinuses/Orbits: No acute finding. Other: Left anterior frontal small hyperdense scalp hematoma noted superficially. IMPRESSION: 1. Brain atrophy and white matter microvascular ischemic changes. 2. No acute intracranial abnormality by noncontrast CT. 3. Small left anterior frontal scalp hematoma. Electronically Signed   By: CHRISTELLA.  Shick M.D.   On: 03/18/2024 12:33   DG Chest 2 View Result Date: 03/18/2024 EXAM: 2 VIEW(S) XRAY OF THE CHEST 03/18/2024 01:16:00 AM COMPARISON: 08/10/2021 CLINICAL HISTORY: sob FINDINGS: LUNGS AND PLEURA: No focal pulmonary opacity. No pleural effusion. No pneumothorax. HEART AND  MEDIASTINUM: Unchanged cardiomediastinal silhouette. Atherosclerotic plaque. BONES AND SOFT TISSUES: Thoracic degenerative changes. Bilateral acromioclavicular joint degenerative changes. IMPRESSION: 1. No acute cardiopulmonary findings. Electronically signed by: Morgane Naveau MD MD 03/18/2024 01:18 AM EST RP Workstation: [id number]    Microbiology: Results for orders placed or performed during the hospital encounter of 03/18/24  Resp panel by RT-PCR (RSV, Flu A&B, Covid) Anterior Nasal Swab     Status: None   Collection Time: 03/18/24  4:05 PM   Specimen: Anterior Nasal Swab  Result Value Ref Range Status   SARS Coronavirus 2 by RT PCR NEGATIVE NEGATIVE Final   Influenza A by PCR NEGATIVE NEGATIVE Final   Influenza B by PCR NEGATIVE NEGATIVE Final    Comment: (NOTE) The Xpert Xpress SARS-CoV-2/FLU/RSV plus assay is intended as an aid in the diagnosis of influenza from Nasopharyngeal swab specimens and should not be used as a sole basis for treatment. Nasal washings and aspirates are unacceptable for Xpert Xpress SARS-CoV-2/FLU/RSV testing.  Fact Sheet for Patients: bloggercourse.com  Fact Sheet for Healthcare Providers: seriousbroker.it  This test is not yet approved or cleared by the [place]  FDA and has been authorized for detection and/or diagnosis of SARS-CoV-2 by FDA under an Emergency Use Authorization (EUA). This EUA will remain in effect (meaning this test can be used) for the duration of the COVID-19 declaration under Section 564(b)(1) of the Act, 21 U.S.C. section 360bbb-3(b)(1), unless  the authorization is terminated or revoked.     Resp Syncytial Virus by PCR NEGATIVE NEGATIVE Final    Comment: (NOTE) Fact Sheet for Patients: bloggercourse.com  Fact Sheet for Healthcare Providers: seriousbroker.it  This test is not yet approved or cleared by the [place]   FDA and has been authorized for detection and/or diagnosis of SARS-CoV-2 by FDA under an Emergency Use Authorization (EUA). This EUA will remain in effect (meaning this test can be used) for the duration of the COVID-19 declaration under Section 564(b)(1) of the Act, 21 U.S.C. section 360bbb-3(b)(1), unless the authorization is terminated or revoked.  Performed at [facility], [address], [place], [place] [address]   Respiratory (~20 pathogens) panel by PCR     Status: None   Collection Time: 03/18/24  4:05 PM   Specimen: Nasopharyngeal Swab; Respiratory  Result Value Ref Range Status   Adenovirus NOT DETECTED NOT DETECTED Final   Coronavirus 229E NOT DETECTED NOT DETECTED Final    Comment: (NOTE) The Coronavirus on the Respiratory Panel, DOES NOT test for the novel  Coronavirus (2019 nCoV)    Coronavirus HKU1 NOT DETECTED NOT DETECTED Final   Coronavirus NL63 NOT DETECTED NOT DETECTED Final   Coronavirus OC43 NOT DETECTED NOT DETECTED Final   Metapneumovirus NOT DETECTED NOT DETECTED Final   Rhinovirus / Enterovirus NOT DETECTED NOT DETECTED Final   Influenza A NOT DETECTED NOT DETECTED Final   Influenza B NOT DETECTED NOT DETECTED Final   Parainfluenza Virus 1 NOT DETECTED NOT DETECTED Final   Parainfluenza Virus 2 NOT DETECTED NOT DETECTED Final   Parainfluenza Virus 3 NOT DETECTED NOT DETECTED Final   Parainfluenza Virus 4 NOT DETECTED NOT DETECTED Final   Respiratory Syncytial Virus NOT DETECTED NOT DETECTED Final   Bordetella pertussis NOT DETECTED NOT DETECTED Final   Bordetella Parapertussis NOT DETECTED NOT DETECTED Final   Chlamydophila pneumoniae NOT DETECTED NOT DETECTED Final   Mycoplasma pneumoniae NOT DETECTED NOT DETECTED Final    Comment: Performed at [facility], [address] [place], [place] [address]    Labs: CBC: Recent Labs  Lab 04/04/24 1447 04/05/24 0226 04/06/24 0133  WBC 8.2 4.7 4.7  NEUTROABS 6.5  --   --   HGB 14.1 13.4  13.5  HCT 44.0 41.1 41.3  MCV 100.7* 99.3 97.9  PLT 235 226 246   Basic Metabolic Panel: Recent Labs  Lab 04/02/24 1010 04/04/24 1447 04/04/24 1628 04/05/24 0226 04/06/24 0133  NA 138 136  --  137 136  K 5.4* 5.1  --  4.4 4.2  CL 95* 97*  --  99 97*  CO2 32 31  --  30 30  GLUCOSE 110* 119*  --  87 115*  BUN 42* 45*  --  55* 50*  CREATININE 1.41* 1.50*  --  1.54* 1.32*  CALCIUM  9.0 8.6*  --  8.3* 8.3*  MG  --  2.8* 2.7*  --   --   PHOS  --   --  4.3  --   --    Liver Function Tests: Recent Labs  Lab 04/04/24 1447  AST 20  ALT 5  ALKPHOS 65  BILITOT 1.1  PROT 8.6*  ALBUMIN  3.4*   CBG: Recent Labs  Lab 04/05/24 0602  GLUCAP 91    Discharge time spent: greater than 30 minutes.  Signed: Sabas GORMAN Brod, MD [facility] 04/06/2024 "

## 2024-04-06 NOTE — Progress Notes (Signed)
 "  Progress Note  Patient Name: [name] Long Date of Encounter: 04/06/2024  Primary Cardiologist: Ozell Fell, MD   Subjective   Patient seen and examined at bedside.  She was lying in bed when I arrived.  She offers no specific complaints.  Inpatient Medications    Scheduled Meds:  apixaban   5 mg Oral BID   brimonidine   1 drop Both Eyes BID   empagliflozin   10 mg Oral q AM   latanoprost   1 drop Both Eyes QHS   sodium chloride  flush  3 mL Intravenous Q12H   sodium chloride  flush  3 mL Intravenous Q12H   Continuous Infusions:  PRN Meds: acetaminophen  **OR** acetaminophen , hydrALAZINE , HYDROmorphone  (DILAUDID ) injection, ipratropium, ondansetron  **OR** ondansetron  (ZOFRAN ) IV, oxyCODONE , senna-docusate, sodium phosphate, traZODone    Vital Signs    Vitals:   04/06/24 0019 04/06/24 0617 04/06/24 0913 04/06/24 1114  BP: 126/79 (!) 160/84 (!) 149/76 138/63  Pulse: 80 73 72 68  Resp: 18 18 16 20   Temp: 98 F (36.7 C) 98.6 F (37 C) 98.2 F (36.8 C)   TempSrc: Oral Oral Oral   SpO2:   99% 95%  Weight:      Height:        Intake/Output Summary (Last 24 hours) at 04/06/2024 1118 Last data filed at 04/06/2024 0616 Gross per 24 hour  Intake 100 ml  Output 970 ml  Net -870 ml   Filed Weights   04/04/24 1404 04/04/24 1748 04/05/24 0300  Weight: 66.7 kg 62.8 kg 62.7 kg    Telemetry    Atrial fibrillation - Personally Reviewed  ECG     - Personally Reviewed  Physical Exam    General: Comfortable Head: Atraumatic, normal size  Eyes: PEERLA, EOMI  Neck: Supple, normal JVD Cardiac: Normal S1, S2; RRR; no murmurs, rubs, or gallops Lungs: Clear to auscultation bilaterally Abd: Soft, nontender, no hepatomegaly  Ext: warm, no edema  Labs    Chemistry Recent Labs  Lab 04/04/24 1447 04/05/24 0226 04/06/24 0133  NA 136 137 136  K 5.1 4.4 4.2  CL 97* 99 97*  CO2 31 30 30   GLUCOSE 119* 87 115*  BUN 45* 55* 50*  CREATININE 1.50* 1.54* 1.32*  CALCIUM  8.6* 8.3*  8.3*  PROT 8.6*  --   --   ALBUMIN  3.4*  --   --   AST 20  --   --   ALT 5  --   --   ALKPHOS 65  --   --   BILITOT 1.1  --   --   GFRNONAA 34* 33* 40*  ANIONGAP 8 8 9      Hematology Recent Labs  Lab 04/04/24 1447 04/05/24 0226 04/06/24 0133  WBC 8.2 4.7 4.7  RBC 4.37 4.14 4.22  HGB 14.1 13.4 13.5  HCT 44.0 41.1 41.3  MCV 100.7* 99.3 97.9  MCH 32.3 32.4 32.0  MCHC 32.0 32.6 32.7  RDW 13.9 13.9 13.8  PLT 235 226 246    Cardiac EnzymesNo results for input(s): TROPONINI in the last 168 hours. No results for input(s): TROPIPOC in the last 168 hours.   BNP Recent Labs  Lab 04/04/24 1628  PROBNP 926.0*     DDimer No results for input(s): DDIMER in the last 168 hours.   Radiology    DG Chest 2 View Result Date: 04/04/2024 CLINICAL DATA:  Syncope EXAM: CHEST - 2 VIEW COMPARISON:  03/18/2024 FINDINGS: Mild-to-moderate cardiomegaly unchanged from prior examination. Mediastinal silhouette within normal limits. No pulmonary  vascular congestion. Lungs are clear. IMPRESSION: Unchanged mild-to-moderate cardiomegaly Electronically Signed   By: Aliene Lloyd M.D.   On: 04/04/2024 15:53    Cardiac Studies   Echo and right heart cath   Patient Profile     84 y.o. female with recent syncope and also being treated for acute heart failure exacerbation  Assessment & Plan    Syncope Acute on chronic Diastolic Heart failure  Pulmonary Hypertension: Mixed etiology  Permanent Atrial fibrillation   Etiology of her syncope still unclear, suspicion for vasovagal syncope is high but at the same time she would benefit from wearing a monitor for atleast 30 days post hospitalization if her tele remains relatively benign with only atrial fibrillation.   Clinically euvolemic   Heart rate is controlled, continue Eliquis . Agree with keep off AV nodal blockers. She can benefit from ambulating.    [facility] will sign off.   Medication Recommendations: Apixaban  5 mg twice  daily, furosemide  80 mg daily, Entresto  49-51 mg twice daily, Jardiance  10 mg daily Other recommendations (labs, testing, etc): We will mail a monitor to her home Follow up as an outpatient: She already has scheduled follow-ups   For questions or updates, please contact [facility] Please consult [email or web address] for contact info under Cardiology/STEMI.      Signed, Dwayn Moravek, DO  04/06/2024, 11:18 AM    "

## 2024-04-06 NOTE — Progress Notes (Incomplete)
 [facility]  PROGRESS NOTE  Hellen Shanley FMW:[id number] DOB: 1940-07-30 DOA: 04/04/2024 PCP: Patient, No Pcp Per   Brief HPI:    84 year old female with extensive history of A-fib (on Eliquis ), cardiomyopathy, DM II, HTN, pulmonary HTN, lymphedema, MGUS .... Presented from home with syncopal episode. Patient had a witnessed lightheadedness as watching TV she was eased to the ground.  Did not fell hit her head. Patient was found bradycardic with heart rate of 35 - 40s, atropine was given the field.  Mildly hypotensive.   She was recently taken off beta-blockers due to bradycardia on the last admission. Denies any headaches visual changes or asymmetric weaknesses.   Recent hospitalization discharge from 1/13- 1/19 for pulmonary hypertension, s/p right cardiac cath,, discontinued beta-blocker due to bradycardia,  EKG in ED A-fib with a rate of 54, incomplete right bundle, QTc 464,     Assessment/Plan:    Syncope and collapse Witnessed syncope and collapse -Unclear etiology, cardiology feels likely vasovagal versus diuretic use -Will check orthostatic vital signs -bradycardia (no recorded strips)-reported heart rate in 30s-40s Patient received atropine in the field -EKG in ED A-fib with rate of 54, QTc of 464, -Monitor electrolytes keeping K>4.0, mag >2.0 (Current serum potassium 5.4, magnesium 2.8) - TSH elevated at 4.94  -Patient was recently taken off beta-blockers - Cardiology consulted, appreciate further evaluation recommendation   Essential hypertension - BP stable -Beta-blocker discontinued on previous admission, no ACE i/ARB Supposed to be on Entresto -not on med list currently Pending med verification Appreciate cardiology input  History of CHF -Lasix  on hold due to syncope  -Last Echo March 19, 2024, EF 60-65%, normal LV function, moderate LVH, mild RV enlargement, reduced function pulmonary artery hypertension, left and right atrium severely dilated, small  pericardial effusion.  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-recommend continue aggressive diuresis   Stage 3 chronic kidney disease (HCC) Worsening kidney function from baseline -BUN 45, creatinine 1.50, GFR 34 -Will try to avoid nephrotoxins, hypotension Unfortunately on 80 mg of Lasix  twice daily, holding tonight's dose, may reinitiate at lower dose -Entresto -not listed   Persistent atrial fibrillation (HCC) Bradycardia -likely led to syncopal episode -Patient has been taken off beta-blockers on previous admission -Continue Eliquis  -appreciate cardiology evaluation recommendations     Hypercholesterolemia Hyperlipidemia with hyper -Lipoprotein (a) -disorder Statin not listed -Will check lipid panel   Elevated TSH TSH at 4.94 -Repeating TSH, obtaining free T3, T4         DVT prophylaxis: Apixaban   Medications     apixaban   5 mg Oral BID   brimonidine   1 drop Both Eyes BID   empagliflozin   10 mg Oral q AM   latanoprost   1 drop Both Eyes QHS   sodium chloride  flush  3 mL Intravenous Q12H   sodium chloride  flush  3 mL Intravenous Q12H     Data Reviewed:   CBG:  Recent Labs  Lab 04/05/24 0602  GLUCAP 91    SpO2: 99 %    Vitals:   04/05/24 1145 04/05/24 1928 04/06/24 0019 04/06/24 0617  BP: (!) 154/90 (!) 110/54 126/79 (!) 160/84  Pulse: 73 70 80 73  Resp: (!) 23 17 18 18   Temp: 98.2 F (36.8 C) 98 F (36.7 C) 98 F (36.7 C) 98.6 F (37 C)  TempSrc: Oral Oral Oral Oral  SpO2: 99% 99%  Weight:      Height:          Data Reviewed:  Basic Metabolic Panel: Recent Labs  Lab 04/02/24 1010 04/04/24 1447 04/04/24 1628 04/05/24 0226 04/06/24 0133  NA 138 136  --  137 136  K 5.4* 5.1  --  4.4 4.2  CL 95* 97*  --  99 97*  CO2 32 31  --  30 30  GLUCOSE 110* 119*  --  87 115*  BUN 42* 45*   --  55* 50*  CREATININE 1.41* 1.50*  --  1.54* 1.32*  CALCIUM  9.0 8.6*  --  8.3* 8.3*  MG  --  2.8* 2.7*  --   --   PHOS  --   --  4.3  --   --     CBC: Recent Labs  Lab 04/04/24 1447 04/05/24 0226 04/06/24 0133  WBC 8.2 4.7 4.7  NEUTROABS 6.5  --   --   HGB 14.1 13.4 13.5  HCT 44.0 41.1 41.3  MCV 100.7* 99.3 97.9  PLT 235 226 246    LFT Recent Labs  Lab 04/04/24 1447  AST 20  ALT 5  ALKPHOS 65  BILITOT 1.1  PROT 8.6*  ALBUMIN  3.4*     Antibiotics: Anti-infectives (From admission, onward)    None        CONSULTS   Code Status: Full code  Family Communication:      Subjective      Objective    Physical Examination:               Sabas GORMAN Brod   [facility] If 7PM-7AM, please contact night-coverage at [email or web address], Office  [phone]   04/06/2024, 8:45 AM  LOS: 2 days

## 2024-04-07 ENCOUNTER — Ambulatory Visit

## 2024-04-07 ENCOUNTER — Encounter (HOSPITAL_BASED_OUTPATIENT_CLINIC_OR_DEPARTMENT_OTHER): Admitting: Internal Medicine

## 2024-04-07 DIAGNOSIS — I4821 Permanent atrial fibrillation: Secondary | ICD-10-CM

## 2024-04-07 DIAGNOSIS — R55 Syncope and collapse: Secondary | ICD-10-CM

## 2024-04-07 NOTE — Progress Notes (Unsigned)
 Enrolled patient for a 14 day Zio XT monitor to be mailed to patients home  [name] Long to read

## 2024-04-08 ENCOUNTER — Encounter (HOSPITAL_BASED_OUTPATIENT_CLINIC_OR_DEPARTMENT_OTHER): Admitting: Internal Medicine

## 2024-04-08 DIAGNOSIS — L97822 Non-pressure chronic ulcer of other part of left lower leg with fat layer exposed: Secondary | ICD-10-CM

## 2024-04-08 DIAGNOSIS — I87312 Chronic venous hypertension (idiopathic) with ulcer of left lower extremity: Secondary | ICD-10-CM

## 2024-04-09 ENCOUNTER — Telehealth: Payer: Self-pay

## 2024-04-09 ENCOUNTER — Ambulatory Visit: Admitting: Family

## 2024-04-09 NOTE — Telephone Encounter (Signed)
 Copied from CRM [id number]. Topic: General - Other >> Apr 09, 2024  1:12 PM Debby BROCKS wrote: Reason for CRM: Latoya from [facility] called to pass on the message that the patients care will start on 04/12/2024  Callback if any questions: [phone]

## 2024-04-09 NOTE — Telephone Encounter (Signed)
 Patient has not established care with us  yet. Pending appointment on 04/11/24

## 2024-04-11 ENCOUNTER — Encounter: Payer: Self-pay | Admitting: Family

## 2024-04-11 ENCOUNTER — Encounter (HOSPITAL_BASED_OUTPATIENT_CLINIC_OR_DEPARTMENT_OTHER): Admitting: Internal Medicine

## 2024-04-11 ENCOUNTER — Ambulatory Visit: Admitting: Family

## 2024-04-11 VITALS — BP 134/78 | HR 72 | Temp 98.3°F | Ht 64.0 in | Wt 151.4 lb

## 2024-04-11 DIAGNOSIS — N1831 Chronic kidney disease, stage 3a: Secondary | ICD-10-CM

## 2024-04-11 DIAGNOSIS — E1122 Type 2 diabetes mellitus with diabetic chronic kidney disease: Secondary | ICD-10-CM | POA: Insufficient documentation

## 2024-04-11 DIAGNOSIS — I10 Essential (primary) hypertension: Secondary | ICD-10-CM

## 2024-04-11 DIAGNOSIS — E7800 Pure hypercholesterolemia, unspecified: Secondary | ICD-10-CM

## 2024-04-11 DIAGNOSIS — S81802D Unspecified open wound, left lower leg, subsequent encounter: Secondary | ICD-10-CM

## 2024-04-11 DIAGNOSIS — N183 Chronic kidney disease, stage 3 unspecified: Secondary | ICD-10-CM

## 2024-04-11 DIAGNOSIS — I4819 Other persistent atrial fibrillation: Secondary | ICD-10-CM

## 2024-04-11 DIAGNOSIS — I5032 Chronic diastolic (congestive) heart failure: Secondary | ICD-10-CM

## 2024-04-11 LAB — CBC WITH DIFFERENTIAL/PLATELET
Absolute Lymphocytes: 1413 {cells}/uL (ref 850–3900)
Absolute Monocytes: 372 {cells}/uL (ref 200–950)
Basophils Absolute: 51 {cells}/uL (ref 0–200)
Basophils Relative: 1 %
Eosinophils Absolute: 41 {cells}/uL (ref 15–500)
Eosinophils Relative: 0.8 %
HCT: 42.8 % (ref 35.9–46.0)
Hemoglobin: 14.2 g/dL (ref 11.7–15.5)
MCH: 32 pg (ref 27.0–33.0)
MCHC: 33.2 g/dL (ref 31.6–35.4)
MCV: 96.4 fL (ref 81.4–101.7)
MPV: 10.4 fL (ref 7.5–12.5)
Monocytes Relative: 7.3 %
Neutro Abs: 3223 {cells}/uL (ref 1500–7800)
Neutrophils Relative %: 63.2 %
Platelets: 310 10*3/uL (ref 140–400)
RBC: 4.44 Million/uL (ref 3.80–5.10)
RDW: 12.2 % (ref 11.0–15.0)
Total Lymphocyte: 27.7 %
WBC: 5.1 10*3/uL (ref 3.8–10.8)

## 2024-04-11 NOTE — Progress Notes (Signed)
 "  Provider: Perel Hauschild FNP-C   Nikkol Pai, Roxan BROCKS, NP  Patient Care Team: Ireland Virrueta, Roxan BROCKS, NP as PCP - General (Family Medicine) Wonda Sharper, MD as PCP - Cardiology (Cardiology)  Extended Emergency Contact Information Primary Emergency Contact: Vitiello,[name]  [place] Mobile Phone: [phone] Relation: Daughter Secondary Emergency Contact: Lennartz,Lynden Mobile Phone: [phone] Relation: Son  Code Status:  Full Code  Goals of care: Advanced Directive information    04/11/2024    1:20 PM  Advanced Directives  Does Patient Have a Medical Advance Directive? No  Would patient like information on creating a medical advance directive? No - Patient declined     Chief Complaint  Patient presents with   Establish Care    New patient appointment.     History of Present Illness   [name] Long is an 84 year old female with atrial fibrillation, congestive heart failure, and diabetes who presents for establishment of care. She is accompanied by her daughter, Rock.  She has a history of atrial fibrillation and congestive heart failure, with a recent hospitalization for dyspnea and peripheral edema, leading to a diagnosis of congestive heart failure. During her hospital stay, she underwent a cardiac catheterization and an x-ray. She reports no chest pain.  Her diabetes is reportedly well-controlled without complications such as retinopathy, nephropathy, or neuropathy. She is not on insulin  and monitors her blood glucose levels at home.  She was recently diagnosed with chronic kidney disease stage three, identified during her recent hospitalizations. Her furosemide  dosage was reduced to once daily due to its impact on her renal function. She avoids NSAIDs to prevent further renal damage.  She has a history of hypertension and hypercholesterolemia. She is not currently under the care of a cardiologist. Her medications include Jardiance  10 mg daily, furosemide  8 mg  daily, and Eliquis  5 mg twice daily. She discontinued Entresto  due to hyperkalemia.  She experienced dizziness and lightheadedness while watching TV but did not fall. She had a fall on December 2nd but has not fallen since.  She has a wound on her left leg from a blister that burst due to swelling. She is receiving wound care for a wound on her left leg from a blister that burst due to swelling. She was recently discharged from [facility] where she received physical and occupational therapy.  No family history of heart disease, stroke, cancer, diabetes, arthritis, or epilepsy. Her social history includes being a retired runner, broadcasting/film/video, living in a two-story house with one other person, and no history of smoking or alcohol use. She drinks coffee only on weekends.  She has received a shingles vaccine but not a recent flu or COVID vaccine. She is unsure of her last tetanus shot. She reports recent weight loss, likely due to fluid loss from diuretics, and monitors her weight daily to manage her congestive heart failure.    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    DM (diabetes mellitus) (HCC)    Dyspnea    Hypertension    Lymphedema    MGUS (monoclonal gammopathy of unknown significance)    Mitral regurgitation    Pedal edema    PVC (premature ventricular contraction)    Past Surgical History:  Procedure Laterality Date   CESAREAN SECTION     RIGHT HEART CATH N/A 03/20/2024   Procedure: RIGHT HEART CATH;  Surgeon: Rolan Ezra RAMAN, MD;  Location: [facility] INVASIVE CV  LAB;  Service: Cardiovascular;  Laterality: N/A;   RIGHT/LEFT HEART CATH AND CORONARY ANGIOGRAPHY N/A 10/13/2020   Procedure: RIGHT/LEFT HEART CATH AND CORONARY ANGIOGRAPHY;  Surgeon: Wonda Sharper, MD;  Location: [facility] INVASIVE CV LAB;  Service: Cardiovascular;  Laterality: N/A;    Allergies[1]  Allergies as of 04/11/2024   No Known Allergies       Medication List        Accurate as of April 11, 2024  4:33 PM. If you have any questions, ask your nurse or doctor.          STOP taking these medications    acetaminophen  325 MG tablet Commonly known as: TYLENOL  Stopped by: Roxan Plough, NP   Entresto  49-51 MG Generic drug: sacubitril -valsartan  Stopped by: Roxan Plough, NP   polyethylene glycol 17 g packet Commonly known as: MIRALAX  / GLYCOLAX  Stopped by: Roxan Plough, NP       TAKE these medications    apixaban  5 MG Tabs tablet Commonly known as: ELIQUIS  Take 5 mg by mouth 2 (two) times daily.   brimonidine  0.2 % ophthalmic solution Commonly known as: ALPHAGAN  Place 1 drop into both eyes 2 (two) times daily.   furosemide  80 MG tablet Commonly known as: LASIX  Take 1 tablet (80 mg total) by mouth daily.   Jardiance  10 MG Tabs tablet Generic drug: empagliflozin  Take 10 mg by mouth in the morning.   latanoprost  0.005 % ophthalmic solution Commonly known as: XALATAN  Place 1 drop into both eyes at bedtime.        Review of Systems  Constitutional:  Negative for appetite change, chills, fatigue, fever and unexpected weight change.  HENT:  Negative for congestion, dental problem, ear discharge, ear pain, hearing loss, nosebleeds, postnasal drip, rhinorrhea, sinus pressure, sinus pain, sneezing, sore throat, tinnitus and trouble swallowing.   Eyes:  Negative for pain, discharge, redness, itching and visual disturbance.  Respiratory:  Negative for cough, chest tightness, shortness of breath and wheezing.   Cardiovascular:  Negative for chest pain, palpitations and leg swelling.  Gastrointestinal:  Negative for abdominal distention, abdominal pain, blood in stool, constipation, diarrhea, nausea and vomiting.  Endocrine: Negative for cold intolerance, heat intolerance, polydipsia, polyphagia and polyuria.  Genitourinary:  Negative for difficulty urinating, dysuria, flank pain, frequency and urgency.   Musculoskeletal:  Positive for gait problem. Negative for arthralgias, back pain, joint swelling, myalgias, neck pain and neck stiffness.  Skin:  Negative for color change, pallor, rash and wound.  Neurological:  Negative for dizziness, syncope, speech difficulty, weakness, light-headedness, numbness and headaches.  Hematological:  Does not bruise/bleed easily.  Psychiatric/Behavioral:  Negative for agitation, behavioral problems, confusion, hallucinations, self-injury, sleep disturbance and suicidal ideas. The patient is not nervous/anxious.      There is no immunization history on file for this patient. Pertinent  Health Maintenance Due  Topic Date Due   Bone Density Scan  Never done   Influenza Vaccine  Never done      08/10/2021    8:33 AM 04/11/2024    1:20 PM  Fall Risk  Falls in the past year?  1  Was there an injury with Fall?  1  Fall Risk Category Calculator  2  (RETIRED) Patient Fall Risk Level Low fall risk    Patient at Risk for Falls Due to  No Fall Risks  Fall risk Follow up  Falls evaluation completed     Data saved with a previous flowsheet row definition   Functional Status Survey:  Vitals:   04/11/24 1322  BP: 134/78  Pulse: 72  Temp: 98.3 F (36.8 C)  SpO2: 98%  Weight: 151 lb 6.4 oz (68.7 kg)  Height: 5' 4 (1.626 m)   Body mass index is 25.99 kg/m. Physical Exam  GENERAL: Alert, cooperative, well developed, no acute distress. HEENT: Normocephalic, normal oropharynx, moist mucous membranes. CHEST: Clear to auscultation bilaterally, no wheezes, rhonchi, or crackles. CARDIOVASCULAR: heart rate irregular rhythm, S1 and S2 normal without murmurs. ABDOMEN: Soft, non-tender, non-distended, without organomegaly, normal bowel sounds. EXTREMITIES: No cyanosis or edema, swelling improved, no pain. NEUROLOGICAL: Cranial nerves grossly intact, moves all extremities without gross motor or sensory deficit.  SKIN: No rash,no lesion or erythema.Left left wound  not visualized dressing dry ,clean and intact managed by wound Care Clinic   PSYCHIATRY/BEHAVIORAL: Mood stable    Labs reviewed: Recent Labs    03/19/24 0318 03/20/24 0247 03/22/24 0240 03/23/24 0251 04/04/24 1447 04/04/24 1628 04/05/24 0226 04/06/24 0133  NA 138   < > 140   < > 136  --  137 136  K 3.5   < > 4.7   < > 5.1  --  4.4 4.2  CL 95*   < > 95*   < > 97*  --  99 97*  CO2 33*   < > 42*   < > 31  --  30 30  GLUCOSE 100*   < > 94   < > 119*  --  87 115*  BUN 29*   < > 25*   < > 45*  --  55* 50*  CREATININE 1.14*   < > 1.22*   < > 1.50*  --  1.54* 1.32*  CALCIUM  9.1   < > 8.4*   < > 8.6*  --  8.3* 8.3*  MG 2.1   < > 1.9  --  2.8* 2.7*  --   --   PHOS 3.5  --   --   --   --  4.3  --   --    < > = values in this interval not displayed.   Recent Labs    12/11/23 1015 03/19/24 0318 04/04/24 1447  AST 16 22 20   ALT 10 12 5   ALKPHOS 69 57 65  BILITOT 1.0 0.8 1.1  PROT 8.2 8.0 8.6*  ALBUMIN  4.0 3.9 3.4*   Recent Labs    03/19/24 0318 03/20/24 0906 04/04/24 1447 04/05/24 0226 04/06/24 0133  WBC 5.1  --  8.2 4.7 4.7  NEUTROABS 4.1  --  6.5  --   --   HGB 13.8   < > 14.1 13.4 13.5  HCT 41.3   < > 44.0 41.1 41.3  MCV 97.6  --  100.7* 99.3 97.9  PLT 212  --  235 226 246   < > = values in this interval not displayed.   Lab Results  Component Value Date   TSH 2.270 04/05/2024   Lab Results  Component Value Date   HGBA1C 7.1 (H) 08/28/2017   Lab Results  Component Value Date   CHOL 126 08/28/2017   HDL 38 (L) 08/28/2017   LDLCALC 78 08/28/2017   TRIG 51 08/28/2017   CHOLHDL 3.3 08/28/2017    Significant Diagnostic Results in last 30 days:  DG Chest 2 View Result Date: 04/04/2024 CLINICAL DATA:  Syncope EXAM: CHEST - 2 VIEW COMPARISON:  03/18/2024 FINDINGS: Mild-to-moderate cardiomegaly unchanged from prior examination. Mediastinal silhouette within normal limits. No pulmonary vascular congestion.  Lungs are clear. IMPRESSION: Unchanged mild-to-moderate  cardiomegaly Electronically Signed   By: Aliene Lloyd M.D.   On: 04/04/2024 15:53   CARDIAC CATHETERIZATION Result Date: 03/20/2024 1. Elevated right and left heart filling pressures.  RV failure seems mildly out of proportion to LV failure. 2. Preserved PAPi 3. 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 4. Low but not markedly low cardiac output. Would continue diuresis, Lasix  increased to 80 mg IV bid.   ECHOCARDIOGRAM COMPLETE Result Date: 03/19/2024    ECHOCARDIOGRAM REPORT   Patient Name:   [name] Long Date of Exam: 03/19/2024 Medical Rec #:  [id number]   Height:       64.0 in Accession #:    [id number]  Weight:       184.5 lb Date of Birth:  1940-05-12   BSA:          1.891 m Patient Age:    83 years    BP:           152/63 mmHg Patient Gender: F           HR:           211 bpm. Exam Location:  Inpatient Procedure: 2D Echo, Cardiac Doppler and Color Doppler (Both Spectral and Color            Flow Doppler were utilized during procedure). Indications:    Mitral valve insufficiency  History:        Patient has prior history of Echocardiogram examinations, most                 recent 12/31/2023. CHF, Arrythmias:Atrial Fibrillation; Risk                 Factors:Hypertension. CKD.  Sonographer:    Philomena Daring Referring Phys: NEWMAN PARAS PATWARDHAN IMPRESSIONS  1. Left ventricular ejection fraction, by estimation, is 60 to 65%. The left ventricle has normal function. The left ventricle has no regional wall motion abnormalities. There is moderate left ventricular hypertrophy of the basal-septal segment. Left ventricular diastolic parameters are indeterminate. Elevated left atrial pressure.  2. Right ventricular systolic function is mildly reduced. The right ventricular size is mildly enlarged. There is moderately elevated pulmonary artery systolic pressure.  3. Left atrial size was severely dilated.  4. Right atrial size was severely dilated.  5. A small pericardial effusion is present.  6.  The mitral valve is normal in structure. Mild to moderate mitral valve regurgitation. No evidence of mitral stenosis.  7. Tricuspid valve regurgitation is moderate to severe.  8. The aortic valve is tricuspid. Aortic valve regurgitation is not visualized. Aortic valve sclerosis/calcification is present, without any evidence of aortic stenosis.  9. Pulmonic valve regurgitation is moderate. 10. The inferior vena cava is dilated in size with <50% respiratory variability, suggesting right atrial pressure of 15 mmHg. FINDINGS  Left Ventricle: Left ventricular ejection fraction, by estimation, is 60 to 65%. The left ventricle has normal function. The left ventricle has no regional wall motion abnormalities. The left ventricular internal cavity size was normal in size. There is  moderate left ventricular hypertrophy of the basal-septal segment. Left ventricular diastolic parameters are indeterminate. Elevated left atrial pressure. Right Ventricle: The right ventricular size is mildly enlarged. Right ventricular systolic function is mildly reduced. There is moderately elevated pulmonary artery systolic pressure. The tricuspid regurgitant velocity is 3.07 m/s, and with an assumed right atrial pressure of 15 mmHg, the estimated right ventricular systolic pressure is 52.7 mmHg. Left Atrium: Left atrial  size was severely dilated. Right Atrium: Right atrial size was severely dilated. Pericardium: A small pericardial effusion is present. Mitral Valve: The mitral valve is normal in structure. Mild to moderate mitral valve regurgitation. No evidence of mitral valve stenosis. Tricuspid Valve: The tricuspid valve is normal in structure. Tricuspid valve regurgitation is moderate to severe. No evidence of tricuspid stenosis. Aortic Valve: The aortic valve is tricuspid. Aortic valve regurgitation is not visualized. Aortic valve sclerosis/calcification is present, without any evidence of aortic stenosis. Pulmonic Valve: The pulmonic  valve was normal in structure. Pulmonic valve regurgitation is moderate. No evidence of pulmonic stenosis. Aorta: The aortic root is normal in size and structure. Venous: The inferior vena cava is dilated in size with less than 50% respiratory variability, suggesting right atrial pressure of 15 mmHg. IAS/Shunts: No atrial level shunt detected by color flow Doppler.  LEFT VENTRICLE PLAX 2D LVIDd:         4.40 cm     Diastology LVIDs:         3.20 cm     LV e' medial:    7.07 cm/s LV PW:         0.80 cm     LV E/e' medial:  17.7 LV IVS:        1.40 cm     LV e' lateral:   12.90 cm/s LVOT diam:     1.80 cm     LV E/e' lateral: 9.7 LV SV:         57 LV SV Index:   30 LVOT Area:     2.54 cm  LV Volumes (MOD) LV vol d, MOD A2C: 80.9 ml LV vol d, MOD A4C: 77.9 ml LV vol s, MOD A2C: 32.9 ml LV vol s, MOD A4C: 32.5 ml LV SV MOD A2C:     48.0 ml LV SV MOD A4C:     77.9 ml LV SV MOD BP:      46.3 ml RIGHT VENTRICLE             IVC RV Basal diam:  4.00 cm     IVC diam: 3.10 cm RV S prime:     10.10 cm/s TAPSE (M-mode): 1.8 cm LEFT ATRIUM              Index        RIGHT ATRIUM           Index LA diam:        4.30 cm  2.27 cm/m   RA Area:     28.90 cm LA Vol (A2C):   119.0 ml 62.94 ml/m  RA Volume:   89.30 ml  47.24 ml/m LA Vol (A4C):   101.0 ml 53.42 ml/m LA Biplane Vol: 115.0 ml 60.83 ml/m  AORTIC VALVE             PULMONIC VALVE LVOT Vmax:   83.40 cm/s  PR End Diast Vel: 2.14 msec LVOT Vmean:  57.200 cm/s LVOT VTI:    0.225 m  AORTA Ao Root diam: 2.50 cm Ao Asc diam:  3.10 cm MITRAL VALVE                  TRICUSPID VALVE MV Area (PHT): 3.79 cm       TR Peak grad:   37.7 mmHg MV Decel Time: 200 msec       TR Vmax:        307.00 cm/s MR Peak grad:    117.5 mmHg MR  Mean grad:    90.0 mmHg    SHUNTS MR Vmax:         542.00 cm/s  Systemic VTI:  0.22 m MR Vmean:        459.0 cm/s   Systemic Diam: 1.80 cm MR PISA:         1.57 cm MR PISA Eff ROA: 9 mm MR PISA Radius:  0.50 cm MV E velocity: 125.00 cm/s Redell Shallow MD  Electronically signed by Redell Shallow MD Signature Date/Time: 03/19/2024/1:54:17 PM    Final    NM Pulmonary Perfusion Result Date: 03/19/2024 EXAM: NM Lung Perfusion Scan. CLINICAL HISTORY: Pulmonary embolism (PE) suspected, high prob; Syncope/presyncope, cerebrovascular cause suspected. TECHNIQUE: Radiolabeled MAA was administered intravenously and planar images of the lungs were obtained in multiple projections. RADIOPHARMACEUTICAL: 4.38 mCi Tc-68m MAA at 0915, IV RAC HB. COMPARISON: None provided. FINDINGS: PERFUSION: Large left lung attenuation defect related to cardiomegaly. No wedge-shaped peripheral perfusion defect is just acute on the limb. IMPRESSION: 1. No evidence of acute pulmonary embolism. 2. Large left lung attenuation defect related to cardiomegaly. Electronically signed by: Norleen Boxer MD 03/19/2024 10:38 AM EST RP Workstation: [id number]   CT Chest Wo Contrast Result Date: 03/18/2024 CLINICAL DATA:  Shortness of breath. EXAM: CT CHEST WITHOUT CONTRAST TECHNIQUE: Multidetector CT imaging of the chest was performed following the standard protocol without IV contrast. RADIATION DOSE REDUCTION: This exam was performed according to the departmental dose-optimization program which includes automated exposure control, adjustment of the mA and/or kV according to patient size and/or use of iterative reconstruction technique. COMPARISON:  None Available. FINDINGS: Evaluation of this exam is limited in the absence of intravenous contrast. Cardiovascular: There is cardiomegaly. Small pericardial effusion. There is coronary vascular calcification. Moderate atherosclerotic calcification of the thoracic aorta. Lungs 1 dilatation. The central pulmonary arteries are grossly unremarkable. Mediastinum/Nodes: No hilar or mediastinal adenopathy. Evaluation however is limited in the absence of intravenous contrast. The esophagus is grossly unremarkable. No mediastinal fluid collection. Lungs/Pleura: No focal  consolidation, pleural effusion, pneumothorax. Small lung nodules or nodular scarring in the anterior right middle lobe measure up to 4 mm and present on the prior CT. The central airways are patent. Upper Abdomen: No acute abnormality. Musculoskeletal: Diffuse subcutaneous edema and anasarca. Osteopenia with degenerative changes of the spine. No acute osseous pathology. IMPRESSION: 1. No acute intrathoracic pathology. 2. Cardiomegaly with small pericardial effusion. 3.  Aortic Atherosclerosis (ICD10-I70.0). Electronically Signed   By: Vanetta Chou M.D.   On: 03/18/2024 16:09   CT Head Wo Contrast Result Date: 03/18/2024 CLINICAL DATA:  Head trauma, minor (Age >= 65y) EXAM: CT HEAD WITHOUT CONTRAST TECHNIQUE: Contiguous axial images were obtained from the base of the skull through the vertex without intravenous contrast. RADIATION DOSE REDUCTION: This exam was performed according to the departmental dose-optimization program which includes automated exposure control, adjustment of the mA and/or kV according to patient size and/or use of iterative reconstruction technique. COMPARISON:  None Available. FINDINGS: Brain: Diffuse brain atrophy pattern and periventricular white matter microvascular ischemic changes. No acute intracranial hemorrhage, new infarction, mass lesion, midline shift, herniation, hydrocephalus, or extra-axial fluid collection. No focal mass effect or edema. Cisterns are patent. Cerebellar atrophy as well. Vascular: No hyperdense vessel or unexpected calcification. Skull: Normal. Negative for fracture or focal lesion. Sinuses/Orbits: No acute finding. Other: Left anterior frontal small hyperdense scalp hematoma noted superficially. IMPRESSION: 1. Brain atrophy and white matter microvascular ischemic changes. 2. No acute intracranial abnormality by noncontrast CT. 3. Small  left anterior frontal scalp hematoma. Electronically Signed   By: CHRISTELLA.  Shick M.D.   On: 03/18/2024 12:33   DG Chest 2  View Result Date: 03/18/2024 EXAM: 2 VIEW(S) XRAY OF THE CHEST 03/18/2024 01:16:00 AM COMPARISON: 08/10/2021 CLINICAL HISTORY: sob FINDINGS: LUNGS AND PLEURA: No focal pulmonary opacity. No pleural effusion. No pneumothorax. HEART AND MEDIASTINUM: Unchanged cardiomediastinal silhouette. Atherosclerotic plaque. BONES AND SOFT TISSUES: Thoracic degenerative changes. Bilateral acromioclavicular joint degenerative changes. IMPRESSION: 1. No acute cardiopulmonary findings. Electronically signed by: Kate Plummer MD MD 03/18/2024 01:18 AM EST RP Workstation: [id number]    Assessment/Plan  Chronic diastolic heart failure Recent hospitalization for acute exacerbation with symptoms of dyspnea and peripheral edema. Echocardiogram shows ejection fraction of 60-65%. Furosemide  reduced to once daily due to renal concerns. - Continue furosemide  80 mg once daily - Monitor weight daily to assess fluid status - Advised low-salt diet to prevent fluid retention  Persistent atrial fibrillation Managed with Eliquis  5 mg twice daily. No recent episodes of chest pain or syncope. No signs of bleeding. - Continue Eliquis  5 mg twice daily  Essential hypertension Hypertension management is crucial to prevent further renal damage and heart failure exacerbation.  Stage 3a chronic kidney disease Recent hospitalization showed elevated creatinine and BUN levels. Furosemide  use may contribute to renal stress. Avoidance of NSAIDs is advised to prevent further renal impairment. - Avoid NSAIDs such as ibuprofen and naproxen - Monitor renal function regularly  Type 2 diabetes mellitus Diabetes is well-controlled with no complications reported. Regular monitoring of blood glucose is advised to prevent complications. - Check blood glucose daily, preferably fasting in the morning  Pure hypercholesterolemia No specific discussion regarding current management or recent lipid levels.  Left lower extremity wound Secondary to a  burst blister from edema. Currently under wound care management with improvement noted. - Continue follow-up with wound care center  General health maintenance Immunization records are incomplete. Tetanus vaccination is overdue. COVID and shingles vaccines are up to date. - Obtain tetanus vaccination at pharmacy - Ensure COVID and shingles vaccines are up to date   Family/ staff Communication: Reviewed plan of care with patient and daughter verbalized understanding   Labs/tests ordered:  - CBC with Differential/Platelet - CMP with eGFR([facility]) - TSH - Hgb A1C - Lipid panel  Next Appointment : Return in about 4 months (around 08/09/2024) for medical mangement of chronic issues.SABRA   Spent 45 minutes of Face to face and non-face to face with patient  >50% time spent counseling; reviewing medical record; tests; labs; documentation and developing future plan of care.   Roxan BROCKS Mandy Fitzwater, NP      [1] No Known Allergies  "

## 2024-04-15 ENCOUNTER — Encounter (HOSPITAL_BASED_OUTPATIENT_CLINIC_OR_DEPARTMENT_OTHER): Admitting: Internal Medicine

## 2024-04-16 ENCOUNTER — Ambulatory Visit (HOSPITAL_COMMUNITY)

## 2024-04-24 ENCOUNTER — Inpatient Hospital Stay: Admitting: Hematology and Oncology

## 2024-04-24 ENCOUNTER — Inpatient Hospital Stay

## 2024-04-28 ENCOUNTER — Ambulatory Visit: Admitting: Nurse Practitioner

## 2024-08-12 ENCOUNTER — Ambulatory Visit: Admitting: Family
